# Patient Record
Sex: Male | Born: 1965 | Race: White | Hispanic: No | Marital: Married | Smoking: Former smoker
Health system: Southern US, Community
[De-identification: ages and names within clinical notes are randomized; demographics above are authoritative.]

## PROBLEM LIST (undated history)

## (undated) DIAGNOSIS — G8929 Other chronic pain: Secondary | ICD-10-CM

## (undated) DIAGNOSIS — E669 Obesity, unspecified: Secondary | ICD-10-CM

## (undated) DIAGNOSIS — Z87820 Personal history of traumatic brain injury: Secondary | ICD-10-CM

## (undated) DIAGNOSIS — L989 Disorder of the skin and subcutaneous tissue, unspecified: Secondary | ICD-10-CM

## (undated) DIAGNOSIS — Z8709 Personal history of other diseases of the respiratory system: Secondary | ICD-10-CM

## (undated) DIAGNOSIS — Z8781 Personal history of (healed) traumatic fracture: Secondary | ICD-10-CM

## (undated) DIAGNOSIS — M545 Low back pain, unspecified: Secondary | ICD-10-CM

## (undated) DIAGNOSIS — M1990 Unspecified osteoarthritis, unspecified site: Secondary | ICD-10-CM

## (undated) DIAGNOSIS — M5020 Other cervical disc displacement, unspecified cervical region: Secondary | ICD-10-CM

## (undated) DIAGNOSIS — R600 Localized edema: Secondary | ICD-10-CM

## (undated) DIAGNOSIS — Z9989 Dependence on other enabling machines and devices: Secondary | ICD-10-CM

## (undated) DIAGNOSIS — L0292 Furuncle, unspecified: Secondary | ICD-10-CM

## (undated) DIAGNOSIS — G4733 Obstructive sleep apnea (adult) (pediatric): Secondary | ICD-10-CM

## (undated) DIAGNOSIS — Z87898 Personal history of other specified conditions: Secondary | ICD-10-CM

## (undated) DIAGNOSIS — Z9109 Other allergy status, other than to drugs and biological substances: Secondary | ICD-10-CM

## (undated) DIAGNOSIS — E119 Type 2 diabetes mellitus without complications: Secondary | ICD-10-CM

## (undated) DIAGNOSIS — I10 Essential (primary) hypertension: Secondary | ICD-10-CM

## (undated) DIAGNOSIS — L0293 Carbuncle, unspecified: Secondary | ICD-10-CM

## (undated) DIAGNOSIS — M5030 Other cervical disc degeneration, unspecified cervical region: Secondary | ICD-10-CM

## (undated) DIAGNOSIS — G629 Polyneuropathy, unspecified: Secondary | ICD-10-CM

## (undated) DIAGNOSIS — Z973 Presence of spectacles and contact lenses: Secondary | ICD-10-CM

## (undated) HISTORY — DX: Obesity, unspecified: E66.9

## (undated) HISTORY — PX: APPENDECTOMY: SHX54

## (undated) HISTORY — DX: Unspecified osteoarthritis, unspecified site: M19.90

## (undated) HISTORY — DX: Other allergy status, other than to drugs and biological substances: Z91.09

---

## 1997-06-14 HISTORY — PX: OTHER SURGICAL HISTORY: SHX169

## 1998-09-18 ENCOUNTER — Emergency Department (HOSPITAL_COMMUNITY): Admission: EM | Admit: 1998-09-18 | Discharge: 1998-09-19 | Payer: Self-pay | Admitting: Emergency Medicine

## 1998-09-19 ENCOUNTER — Ambulatory Visit (HOSPITAL_BASED_OUTPATIENT_CLINIC_OR_DEPARTMENT_OTHER): Admission: RE | Admit: 1998-09-19 | Discharge: 1998-09-19 | Payer: Self-pay | Admitting: Orthopedic Surgery

## 2001-08-21 ENCOUNTER — Encounter: Payer: Self-pay | Admitting: Emergency Medicine

## 2001-08-21 ENCOUNTER — Emergency Department (HOSPITAL_COMMUNITY): Admission: EM | Admit: 2001-08-21 | Discharge: 2001-08-21 | Payer: Self-pay | Admitting: *Deleted

## 2001-08-23 HISTORY — PX: OTHER SURGICAL HISTORY: SHX169

## 2001-08-24 ENCOUNTER — Inpatient Hospital Stay (HOSPITAL_COMMUNITY): Admission: AD | Admit: 2001-08-24 | Discharge: 2001-08-25 | Payer: Self-pay | Admitting: Orthopedic Surgery

## 2001-11-11 ENCOUNTER — Emergency Department (HOSPITAL_COMMUNITY): Admission: EM | Admit: 2001-11-11 | Discharge: 2001-11-11 | Payer: Self-pay | Admitting: Emergency Medicine

## 2002-01-10 ENCOUNTER — Emergency Department (HOSPITAL_COMMUNITY): Admission: EM | Admit: 2002-01-10 | Discharge: 2002-01-10 | Payer: Self-pay | Admitting: Emergency Medicine

## 2002-01-10 ENCOUNTER — Encounter: Payer: Self-pay | Admitting: Emergency Medicine

## 2002-01-17 ENCOUNTER — Ambulatory Visit (HOSPITAL_COMMUNITY): Admission: RE | Admit: 2002-01-17 | Discharge: 2002-01-17 | Payer: Self-pay | Admitting: Specialist

## 2002-01-17 ENCOUNTER — Encounter: Payer: Self-pay | Admitting: Specialist

## 2002-02-08 ENCOUNTER — Observation Stay (HOSPITAL_COMMUNITY): Admission: RE | Admit: 2002-02-08 | Discharge: 2002-02-09 | Payer: Self-pay | Admitting: Specialist

## 2002-02-08 ENCOUNTER — Encounter: Payer: Self-pay | Admitting: Specialist

## 2002-02-08 ENCOUNTER — Encounter (INDEPENDENT_AMBULATORY_CARE_PROVIDER_SITE_OTHER): Payer: Self-pay | Admitting: Specialist

## 2002-02-08 HISTORY — PX: LUMBAR DISC SURGERY: SHX700

## 2002-05-07 ENCOUNTER — Encounter: Admission: RE | Admit: 2002-05-07 | Discharge: 2002-08-05 | Payer: Self-pay

## 2002-08-15 ENCOUNTER — Encounter: Admission: RE | Admit: 2002-08-15 | Discharge: 2002-11-13 | Payer: Self-pay

## 2002-11-20 ENCOUNTER — Encounter
Admission: RE | Admit: 2002-11-20 | Discharge: 2003-02-18 | Payer: Self-pay | Admitting: Physical Medicine & Rehabilitation

## 2003-01-08 ENCOUNTER — Emergency Department (HOSPITAL_COMMUNITY): Admission: EM | Admit: 2003-01-08 | Discharge: 2003-01-08 | Payer: Self-pay | Admitting: Emergency Medicine

## 2003-01-08 ENCOUNTER — Encounter: Payer: Self-pay | Admitting: Emergency Medicine

## 2003-03-14 ENCOUNTER — Encounter
Admission: RE | Admit: 2003-03-14 | Discharge: 2003-06-12 | Payer: Self-pay | Admitting: Physical Medicine & Rehabilitation

## 2003-07-05 ENCOUNTER — Encounter
Admission: RE | Admit: 2003-07-05 | Discharge: 2003-10-03 | Payer: Self-pay | Admitting: Physical Medicine & Rehabilitation

## 2003-08-17 ENCOUNTER — Emergency Department (HOSPITAL_COMMUNITY): Admission: EM | Admit: 2003-08-17 | Discharge: 2003-08-17 | Payer: Self-pay | Admitting: Emergency Medicine

## 2003-10-16 ENCOUNTER — Encounter
Admission: RE | Admit: 2003-10-16 | Discharge: 2004-01-14 | Payer: Self-pay | Admitting: Physical Medicine & Rehabilitation

## 2003-12-05 ENCOUNTER — Encounter: Admission: RE | Admit: 2003-12-05 | Discharge: 2003-12-05 | Payer: Self-pay | Admitting: Family Medicine

## 2003-12-26 ENCOUNTER — Encounter: Admission: RE | Admit: 2003-12-26 | Discharge: 2003-12-26 | Payer: Self-pay | Admitting: Family Medicine

## 2004-01-14 ENCOUNTER — Encounter
Admission: RE | Admit: 2004-01-14 | Discharge: 2004-02-13 | Payer: Self-pay | Admitting: Physical Medicine & Rehabilitation

## 2004-02-12 ENCOUNTER — Encounter: Admission: RE | Admit: 2004-02-12 | Discharge: 2004-02-12 | Payer: Self-pay | Admitting: Family Medicine

## 2004-02-28 ENCOUNTER — Encounter
Admission: RE | Admit: 2004-02-28 | Discharge: 2004-05-28 | Payer: Self-pay | Admitting: Physical Medicine & Rehabilitation

## 2004-02-28 ENCOUNTER — Ambulatory Visit: Payer: Self-pay | Admitting: Physical Medicine & Rehabilitation

## 2004-03-24 ENCOUNTER — Emergency Department (HOSPITAL_COMMUNITY): Admission: EM | Admit: 2004-03-24 | Discharge: 2004-03-24 | Payer: Self-pay | Admitting: Emergency Medicine

## 2004-05-21 ENCOUNTER — Ambulatory Visit: Payer: Self-pay | Admitting: Family Medicine

## 2004-07-03 ENCOUNTER — Emergency Department (HOSPITAL_COMMUNITY): Admission: EM | Admit: 2004-07-03 | Discharge: 2004-07-03 | Payer: Self-pay | Admitting: Emergency Medicine

## 2004-07-06 ENCOUNTER — Ambulatory Visit: Payer: Self-pay | Admitting: Sports Medicine

## 2004-07-13 ENCOUNTER — Emergency Department (HOSPITAL_COMMUNITY): Admission: EM | Admit: 2004-07-13 | Discharge: 2004-07-13 | Payer: Self-pay | Admitting: Emergency Medicine

## 2004-08-20 ENCOUNTER — Ambulatory Visit: Payer: Self-pay | Admitting: Sports Medicine

## 2004-08-24 ENCOUNTER — Emergency Department (HOSPITAL_COMMUNITY): Admission: EM | Admit: 2004-08-24 | Discharge: 2004-08-24 | Payer: Self-pay | Admitting: Emergency Medicine

## 2004-09-03 ENCOUNTER — Ambulatory Visit (HOSPITAL_COMMUNITY): Admission: RE | Admit: 2004-09-03 | Discharge: 2004-09-03 | Payer: Self-pay | Admitting: Neurological Surgery

## 2004-09-18 ENCOUNTER — Ambulatory Visit: Payer: Self-pay | Admitting: Sports Medicine

## 2004-11-06 ENCOUNTER — Ambulatory Visit: Payer: Self-pay | Admitting: Family Medicine

## 2006-07-06 ENCOUNTER — Ambulatory Visit: Payer: Self-pay | Admitting: Family Medicine

## 2006-08-11 DIAGNOSIS — M5417 Radiculopathy, lumbosacral region: Secondary | ICD-10-CM | POA: Insufficient documentation

## 2006-08-11 DIAGNOSIS — R350 Frequency of micturition: Secondary | ICD-10-CM

## 2006-08-11 DIAGNOSIS — E669 Obesity, unspecified: Secondary | ICD-10-CM

## 2006-08-11 DIAGNOSIS — I10 Essential (primary) hypertension: Secondary | ICD-10-CM

## 2006-08-11 DIAGNOSIS — F17200 Nicotine dependence, unspecified, uncomplicated: Secondary | ICD-10-CM

## 2006-08-11 HISTORY — DX: Obesity, unspecified: E66.9

## 2007-01-30 ENCOUNTER — Ambulatory Visit: Payer: Self-pay | Admitting: Family Medicine

## 2007-01-30 ENCOUNTER — Encounter: Payer: Self-pay | Admitting: Family Medicine

## 2007-01-30 DIAGNOSIS — H60509 Unspecified acute noninfective otitis externa, unspecified ear: Secondary | ICD-10-CM

## 2007-01-30 DIAGNOSIS — R000 Tachycardia, unspecified: Secondary | ICD-10-CM | POA: Insufficient documentation

## 2007-01-31 ENCOUNTER — Encounter: Payer: Self-pay | Admitting: Family Medicine

## 2007-02-01 ENCOUNTER — Encounter: Payer: Self-pay | Admitting: Family Medicine

## 2007-02-22 ENCOUNTER — Telehealth: Payer: Self-pay | Admitting: *Deleted

## 2007-03-10 ENCOUNTER — Ambulatory Visit: Payer: Self-pay | Admitting: Family Medicine

## 2007-03-10 ENCOUNTER — Encounter (INDEPENDENT_AMBULATORY_CARE_PROVIDER_SITE_OTHER): Payer: Self-pay | Admitting: Family Medicine

## 2007-03-10 DIAGNOSIS — R609 Edema, unspecified: Secondary | ICD-10-CM | POA: Insufficient documentation

## 2007-03-10 LAB — CONVERTED CEMR LAB
Albumin: 4.8 g/dL (ref 3.5–5.2)
CO2: 24 meq/L (ref 19–32)
Glucose, Bld: 112 mg/dL — ABNORMAL HIGH (ref 70–99)
Hemoglobin: 16.2 g/dL (ref 13.0–17.0)
MCV: 91.8 fL (ref 78.0–100.0)
Potassium: 4.6 meq/L (ref 3.5–5.3)
Pro B Natriuretic peptide (BNP): 7 pg/mL (ref 0.0–100.0)
RBC: 5.25 M/uL (ref 4.22–5.81)
Sodium: 141 meq/L (ref 135–145)
Total Bilirubin: 0.7 mg/dL (ref 0.3–1.2)
Total Protein: 7.5 g/dL (ref 6.0–8.3)
WBC: 8.4 10*3/uL (ref 4.0–10.5)

## 2007-03-12 ENCOUNTER — Encounter (INDEPENDENT_AMBULATORY_CARE_PROVIDER_SITE_OTHER): Payer: Self-pay | Admitting: Family Medicine

## 2007-03-17 ENCOUNTER — Ambulatory Visit: Payer: Self-pay | Admitting: Vascular Surgery

## 2007-03-17 ENCOUNTER — Ambulatory Visit: Payer: Self-pay | Admitting: Family Medicine

## 2007-03-17 ENCOUNTER — Telehealth (INDEPENDENT_AMBULATORY_CARE_PROVIDER_SITE_OTHER): Payer: Self-pay | Admitting: Family Medicine

## 2007-03-17 ENCOUNTER — Ambulatory Visit (HOSPITAL_COMMUNITY): Admission: RE | Admit: 2007-03-17 | Discharge: 2007-03-17 | Payer: Self-pay | Admitting: Sports Medicine

## 2007-03-21 ENCOUNTER — Ambulatory Visit: Payer: Self-pay | Admitting: Family Medicine

## 2007-03-21 ENCOUNTER — Encounter (INDEPENDENT_AMBULATORY_CARE_PROVIDER_SITE_OTHER): Payer: Self-pay | Admitting: Family Medicine

## 2007-03-22 ENCOUNTER — Encounter (INDEPENDENT_AMBULATORY_CARE_PROVIDER_SITE_OTHER): Payer: Self-pay | Admitting: Family Medicine

## 2007-03-22 LAB — CONVERTED CEMR LAB
Calcium: 9.3 mg/dL (ref 8.4–10.5)
Cholesterol: 227 mg/dL — ABNORMAL HIGH (ref 0–200)
HDL: 33 mg/dL — ABNORMAL LOW (ref >39)
Sodium: 140 meq/L (ref 135–145)
Total CHOL/HDL Ratio: 6.9

## 2007-03-27 ENCOUNTER — Encounter (INDEPENDENT_AMBULATORY_CARE_PROVIDER_SITE_OTHER): Payer: Self-pay | Admitting: Family Medicine

## 2007-04-21 ENCOUNTER — Telehealth: Payer: Self-pay | Admitting: *Deleted

## 2007-04-21 ENCOUNTER — Ambulatory Visit: Payer: Self-pay | Admitting: Family Medicine

## 2007-04-27 ENCOUNTER — Encounter: Payer: Self-pay | Admitting: *Deleted

## 2007-04-27 ENCOUNTER — Encounter (INDEPENDENT_AMBULATORY_CARE_PROVIDER_SITE_OTHER): Payer: Self-pay | Admitting: Family Medicine

## 2007-04-27 ENCOUNTER — Ambulatory Visit: Payer: Self-pay | Admitting: Family Medicine

## 2007-04-27 DIAGNOSIS — E785 Hyperlipidemia, unspecified: Secondary | ICD-10-CM

## 2007-05-02 ENCOUNTER — Ambulatory Visit (HOSPITAL_COMMUNITY): Admission: RE | Admit: 2007-05-02 | Discharge: 2007-05-02 | Payer: Self-pay | Admitting: Family Medicine

## 2007-05-02 ENCOUNTER — Encounter: Payer: Self-pay | Admitting: Family Medicine

## 2007-05-03 HISTORY — PX: TRANSTHORACIC ECHOCARDIOGRAM: SHX275

## 2007-05-04 ENCOUNTER — Encounter (INDEPENDENT_AMBULATORY_CARE_PROVIDER_SITE_OTHER): Payer: Self-pay | Admitting: Family Medicine

## 2007-07-20 ENCOUNTER — Telehealth (INDEPENDENT_AMBULATORY_CARE_PROVIDER_SITE_OTHER): Payer: Self-pay | Admitting: Family Medicine

## 2007-07-20 ENCOUNTER — Ambulatory Visit: Payer: Self-pay | Admitting: Family Medicine

## 2007-08-17 ENCOUNTER — Encounter (INDEPENDENT_AMBULATORY_CARE_PROVIDER_SITE_OTHER): Payer: Self-pay | Admitting: Family Medicine

## 2007-09-05 ENCOUNTER — Emergency Department (HOSPITAL_COMMUNITY): Admission: EM | Admit: 2007-09-05 | Discharge: 2007-09-06 | Payer: Self-pay | Admitting: Emergency Medicine

## 2007-09-11 ENCOUNTER — Ambulatory Visit: Payer: Self-pay | Admitting: Family Medicine

## 2007-09-11 ENCOUNTER — Encounter (INDEPENDENT_AMBULATORY_CARE_PROVIDER_SITE_OTHER): Payer: Self-pay | Admitting: Family Medicine

## 2007-09-11 LAB — CONVERTED CEMR LAB
ALT: 73 units/L — ABNORMAL HIGH (ref 0–53)
AST: 25 units/L (ref 0–37)
Albumin: 4.7 g/dL (ref 3.5–5.2)
Triglycerides: 1015 mg/dL — ABNORMAL HIGH (ref <150)

## 2007-10-07 ENCOUNTER — Emergency Department (HOSPITAL_COMMUNITY): Admission: EM | Admit: 2007-10-07 | Discharge: 2007-10-07 | Payer: Self-pay | Admitting: Emergency Medicine

## 2007-12-08 ENCOUNTER — Ambulatory Visit: Payer: Self-pay | Admitting: Family Medicine

## 2008-04-01 ENCOUNTER — Emergency Department (HOSPITAL_COMMUNITY): Admission: EM | Admit: 2008-04-01 | Discharge: 2008-04-01 | Payer: Self-pay | Admitting: Emergency Medicine

## 2008-04-01 ENCOUNTER — Ambulatory Visit: Payer: Self-pay | Admitting: Family Medicine

## 2008-04-01 ENCOUNTER — Encounter (INDEPENDENT_AMBULATORY_CARE_PROVIDER_SITE_OTHER): Payer: Self-pay | Admitting: Family Medicine

## 2008-04-01 ENCOUNTER — Telehealth (INDEPENDENT_AMBULATORY_CARE_PROVIDER_SITE_OTHER): Payer: Self-pay | Admitting: Family Medicine

## 2008-04-01 DIAGNOSIS — T782XXA Anaphylactic shock, unspecified, initial encounter: Secondary | ICD-10-CM

## 2008-04-01 DIAGNOSIS — L299 Pruritus, unspecified: Secondary | ICD-10-CM | POA: Insufficient documentation

## 2008-04-02 ENCOUNTER — Telehealth: Payer: Self-pay | Admitting: *Deleted

## 2008-04-02 ENCOUNTER — Encounter (INDEPENDENT_AMBULATORY_CARE_PROVIDER_SITE_OTHER): Payer: Self-pay | Admitting: Family Medicine

## 2008-04-02 DIAGNOSIS — R740 Nonspecific elevation of levels of transaminase and lactic acid dehydrogenase [LDH]: Secondary | ICD-10-CM

## 2008-04-02 LAB — CONVERTED CEMR LAB
AST: 26 units/L (ref 0–37)
Albumin: 4.6 g/dL (ref 3.5–5.2)
Alkaline Phosphatase: 55 units/L (ref 39–117)
BUN: 12 mg/dL (ref 6–23)
Hemoglobin: 15.8 g/dL (ref 13.0–17.0)
MCHC: 31.9 g/dL (ref 30.0–36.0)
Potassium: 5.1 meq/L (ref 3.5–5.3)
RDW: 13 % (ref 11.5–15.5)
Sodium: 140 meq/L (ref 135–145)

## 2008-04-03 LAB — CONVERTED CEMR LAB: Hep A IgM: NEGATIVE

## 2008-04-04 ENCOUNTER — Emergency Department (HOSPITAL_COMMUNITY): Admission: EM | Admit: 2008-04-04 | Discharge: 2008-04-04 | Payer: Self-pay | Admitting: Family Medicine

## 2008-04-04 ENCOUNTER — Encounter: Payer: Self-pay | Admitting: Family Medicine

## 2008-04-04 DIAGNOSIS — M2550 Pain in unspecified joint: Secondary | ICD-10-CM

## 2008-04-09 ENCOUNTER — Encounter: Payer: Self-pay | Admitting: Family Medicine

## 2008-04-09 ENCOUNTER — Ambulatory Visit: Payer: Self-pay | Admitting: Family Medicine

## 2008-04-09 LAB — CONVERTED CEMR LAB
ALT: 50 units/L (ref 0–53)
Albumin: 3.5 g/dL (ref 3.5–5.2)
Barbiturate Quant, Ur: NEGATIVE
Basophils Relative: 0 % (ref 0–1)
Benzodiazepines.: NEGATIVE
CO2: 29 meq/L (ref 19–32)
Calcium: 9.2 mg/dL (ref 8.4–10.5)
Chloride: 94 meq/L — ABNORMAL LOW (ref 96–112)
Creatinine,U: 303 mg/dL
Glucose, Bld: 158 mg/dL — ABNORMAL HIGH (ref 70–99)
Hemoglobin: 13.5 g/dL (ref 13.0–17.0)
Lead-Whole Blood: 0.7 ug/dL (ref <10.0)
Lymphocytes Relative: 17 % (ref 12–46)
Lymphs Abs: 2.1 10*3/uL (ref 0.7–4.0)
MCHC: 32.4 g/dL (ref 30.0–36.0)
Methadone: NEGATIVE
Monocytes Absolute: 0.7 10*3/uL (ref 0.1–1.0)
Monocytes Relative: 5 % (ref 3–12)
Neutro Abs: 9.3 10*3/uL — ABNORMAL HIGH (ref 1.7–7.7)
Potassium: 4 meq/L (ref 3.5–5.3)
Propoxyphene: NEGATIVE
RBC: 4.46 M/uL (ref 4.22–5.81)
Sodium: 138 meq/L (ref 135–145)
TSH: 2.081 microintl units/mL (ref 0.350–4.50)
Total Protein: 6.2 g/dL (ref 6.0–8.3)
WBC: 12.4 10*3/uL — ABNORMAL HIGH (ref 4.0–10.5)

## 2008-04-11 ENCOUNTER — Telehealth (INDEPENDENT_AMBULATORY_CARE_PROVIDER_SITE_OTHER): Payer: Self-pay | Admitting: Family Medicine

## 2008-04-23 ENCOUNTER — Ambulatory Visit: Payer: Self-pay | Admitting: Family Medicine

## 2008-10-31 ENCOUNTER — Emergency Department (HOSPITAL_COMMUNITY): Admission: EM | Admit: 2008-10-31 | Discharge: 2008-10-31 | Payer: Self-pay | Admitting: Emergency Medicine

## 2008-10-31 ENCOUNTER — Encounter: Payer: Self-pay | Admitting: Family Medicine

## 2009-03-20 ENCOUNTER — Encounter: Payer: Self-pay | Admitting: Family Medicine

## 2009-06-17 ENCOUNTER — Encounter: Payer: Self-pay | Admitting: Family Medicine

## 2009-06-17 ENCOUNTER — Ambulatory Visit: Payer: Self-pay | Admitting: Family Medicine

## 2009-06-17 DIAGNOSIS — R209 Unspecified disturbances of skin sensation: Secondary | ICD-10-CM | POA: Insufficient documentation

## 2009-06-17 LAB — CONVERTED CEMR LAB
BUN: 10 mg/dL (ref 6–23)
Calcium: 9.3 mg/dL (ref 8.4–10.5)
Creatinine, Ser: 0.83 mg/dL (ref 0.40–1.50)
Folate: >20 ng/mL
HDL: 41 mg/dL (ref >39)
TSH: 0.705 microintl units/mL (ref 0.350–4.500)
Triglycerides: 631 mg/dL — ABNORMAL HIGH (ref <150)

## 2009-06-25 ENCOUNTER — Telehealth (INDEPENDENT_AMBULATORY_CARE_PROVIDER_SITE_OTHER): Payer: Self-pay | Admitting: *Deleted

## 2009-06-25 ENCOUNTER — Ambulatory Visit: Payer: Self-pay | Admitting: Family Medicine

## 2009-06-30 ENCOUNTER — Encounter: Payer: Self-pay | Admitting: Family Medicine

## 2009-07-17 ENCOUNTER — Ambulatory Visit: Payer: Self-pay | Admitting: Family Medicine

## 2009-07-25 ENCOUNTER — Telehealth: Payer: Self-pay | Admitting: Family Medicine

## 2009-07-29 ENCOUNTER — Telehealth: Payer: Self-pay | Admitting: *Deleted

## 2009-07-30 ENCOUNTER — Encounter: Payer: Self-pay | Admitting: *Deleted

## 2009-07-30 ENCOUNTER — Ambulatory Visit: Payer: Self-pay | Admitting: Family Medicine

## 2009-08-13 ENCOUNTER — Encounter: Payer: Self-pay | Admitting: Family Medicine

## 2009-08-13 ENCOUNTER — Ambulatory Visit: Payer: Self-pay | Admitting: Family Medicine

## 2009-08-13 DIAGNOSIS — L0293 Carbuncle, unspecified: Secondary | ICD-10-CM

## 2009-08-21 ENCOUNTER — Encounter: Payer: Self-pay | Admitting: Family Medicine

## 2009-09-01 ENCOUNTER — Ambulatory Visit: Payer: Self-pay | Admitting: Family Medicine

## 2009-09-01 ENCOUNTER — Encounter: Payer: Self-pay | Admitting: Family Medicine

## 2009-12-08 ENCOUNTER — Encounter: Payer: Self-pay | Admitting: Family Medicine

## 2010-02-04 ENCOUNTER — Ambulatory Visit: Payer: Self-pay | Admitting: Vascular Surgery

## 2010-02-04 ENCOUNTER — Ambulatory Visit
Admission: RE | Admit: 2010-02-04 | Discharge: 2010-02-04 | Payer: Self-pay | Admitting: Physical Medicine and Rehabilitation

## 2010-02-04 ENCOUNTER — Encounter (INDEPENDENT_AMBULATORY_CARE_PROVIDER_SITE_OTHER): Payer: Self-pay | Admitting: Physical Medicine and Rehabilitation

## 2010-04-09 ENCOUNTER — Encounter: Payer: Self-pay | Admitting: Family Medicine

## 2010-06-04 ENCOUNTER — Encounter: Payer: Self-pay | Admitting: Family Medicine

## 2010-07-09 ENCOUNTER — Ambulatory Visit: Admit: 2010-07-09 | Payer: Self-pay

## 2010-07-14 NOTE — Miscellaneous (Signed)
Summary: Consent for Procedure  Consent for Procedure   Imported By: Clydell Hakim 08/14/2009 14:55:44  _____________________________________________________________________  External Attachment:    Type:   Image     Comment:   External Document

## 2010-07-14 NOTE — Letter (Signed)
Summary: Lipid Letter  [facility]  [address]   [place], [place] [address]   Phone: [phone]  Fax: [phone]    06/30/2009  [name] [address] [place], [place]  [address]  Dear [name]:  We have carefully reviewed your last lipid profile from 06/17/2009 and the results are noted below with a summary of recommendations for lipid management.    Cholesterol:       248     Goal: < 200   HDL "good" Cholesterol:   41     Goal: > 40   LDL "bad" Cholesterol:   not calculated   Goal: < 130   Triglycerides:       631     Goal: < 150  When the triglyceride level is too high, LDL is not calculated.    Your kidney function was normal.  Your thyroid function was normal.  Your sugar (glucose) was 106 (normal in fasting state is less than 99.  Your value falls in the range of prediabetes.  This is something we will need to keep an eye on.  Your vitamin B12 and folate levels were well within normal limits.  Your triglyceride levels were too high.  we may need to talk about starting a medicine to help control this.    TLC Diet (Therapeutic Lifestyle Change): Saturated Fats & Transfatty acids should be kept < 7% of total calories ***Reduce Saturated Fats Polyunstaurated Fat can be up to 10% of total calories Monounsaturated Fat Fat can be up to 20% of total calories Total Fat should be no greater than 25-35% of total calories Carbohydrates should be 50-60% of total calories Protein should be approximately 15% of total calories Fiber should be at least 20-30 grams a day ***Increased fiber may help lower LDL Total Cholesterol should be < 200mg /day Consider adding plant stanol/sterols to diet (example: Benacol spread) ***A higher intake of unsaturated fat may reduce Triglycerides and Increase HDL    Adjunctive Measures (may lower LIPIDS and reduce risk of Heart Attack) include: Aerobic Exercise (20-30 minutes 3-4 times a week) Limit Alcohol  Consumption Weight Reduction Aspirin 75-81 mg a day by mouth (if not allergic or contraindicated) Dietary Fiber 20-30 grams a day by mouth     Current Medications: 1)    Hydrocodone-acetaminophen 7.5-325 Mg Tabs (Hydrocodone-acetaminophen) .... Take 1 tablet by mouth three times a day 2)    Epipen 2-pak 0.3 Mg/0.75ml (1:1000) Devi (Epinephrine hcl (anaphylaxis)) .... Use as directed for symptoms of anaphylaxis 3)    Ventolin Hfa 108 (90 Base) Mcg/act Aers (Albuterol sulfate) .... Two puffs 4 times a day scheduled for the next 5 days.  then every 4 hours as needed for cough/wheeze 4)    Neurontin 100 Mg Caps (Gabapentin) .... One by mouth at bedtime x 1 week then one by mouth two times a day 5)    Benzonatate 100 Mg Caps (Benzonatate) .... One by mouth three times a day as needed cough 6)    Doxycycline Hyclate 100 Mg Caps (Doxycycline hyclate) .Marland Kitchen.. 1 cap by mouth two times a day for 10 days  If you have any questions, please call. We appreciate being able to work with you.   Sincerely,    [facility] Eustaquio Boyden  MD  Appended Document: Lipid Letter letter mailed.

## 2010-07-14 NOTE — Consult Note (Signed)
Summary: [facility]  [facility]   Imported By: De Nurse 12/23/2009 14:55:54  _____________________________________________________________________  External Attachment:    Type:   Image     Comment:   External Document

## 2010-07-14 NOTE — Progress Notes (Signed)
Summary: triage  Phone Note Call from Patient Call back at [phone]   Summary of Call: Pt was seen last week and still has cough and fever.  Wanting to be worked in today. Initial call taken by: Clydell Hakim,  June 25, 2009 11:54 AM  Follow-up for Phone Call        spoke with patient and  he states he has continued with cough and now has fever of 102.  appointment scheduled today .  Follow-up by: Theresia Lo RN,  June 25, 2009 11:58 AM

## 2010-07-14 NOTE — Consult Note (Signed)
Summary: Surgery - pilonidal cyst, rtc 1 mo and consider surgery  Allen,MD   Imported By: Bradly Bienenstock 09/15/2009 11:42:22  _____________________________________________________________________  External Attachment:    Type:   Image     Comment:   External Document

## 2010-07-14 NOTE — Consult Note (Signed)
Summary: [facility] - see below  [facility]   Imported By: Clydell Hakim 10/01/2009 15:00:53  _____________________________________________________________________  External Attachment:    Type:   Image     Comment:   External Document

## 2010-07-14 NOTE — Assessment & Plan Note (Signed)
Summary: boil on thigh,tcb   Vital Signs:  Patient profile:   45 year old male Height:      66 inches Weight:      277.9 pounds BMI:     45.02 Temp:     98.3 degrees F oral Pulse rate:   109 / minute BP sitting:   150 / 98  (left arm) Cuff size:   large  Vitals Entered By: Garen Grams LPN (September 01, 2009 2:12 PM) CC: boil on right thigh Is Patient Diabetic? No Pain Assessment Patient in pain? yes     Location: right thigh   Primary Care Provider:  Eustaquio Boyden  MD  CC:  boil on right thigh.  History of Present Illness: Boil R inner thigh.    Has had same place several times over the years.  Came up this time 2-3 days ago.  No fever or spreading redness or discharge  ROS - as above PMH - Medications reviewed and updated in medication list.  Smoking Status noted in VS form    Habits & Providers  Alcohol-Tobacco-Diet     Tobacco Status: current  Current Medications (verified): 1)  Multivitamins  Tabs (Multiple Vitamin) 2)  B Complex  Tabs (B Complex Vitamins) 3)  Epipen 2-Pak 0.3 Mg/0.90ml (1:1000) Devi (Epinephrine Hcl (Anaphylaxis)) .... Use As Directed For Symptoms of Anaphylaxis 4)  Ventolin Hfa 108 (90 Base) Mcg/act Aers (Albuterol Sulfate) .... Two Puffs 4 Times A Day Scheduled For The Next 5 Days.  Then Every 4 Hours As Needed For Cough/wheeze 5)  Hydrocodone-Acetaminophen 7.5-325 Mg Tabs (Hydrocodone-Acetaminophen) .... Take 1 Tablet By Mouth Three Times A Day 6)  Neurontin 300 Mg Caps (Gabapentin) .... Take One By Mouth Three Times A Day 7)  Amitriptyline Hcl 50 Mg Tabs (Amitriptyline Hcl) .... Take One By Mouth Qhs 8)  Fish Oil 1000 Mg Caps (Omega-3 Fatty Acids) .... One By Mouth Three Times A Day 9)  Mucinex Dm 30-600 Mg Xr12h-Tab (Dextromethorphan-Guaifenesin) .... Take One By Mouth Two Times A Day As Needed Cough 10)  Ibuprofen 800 Mg Tabs (Ibuprofen) .... One Tab By Mouth Three Times A Day As Needed Pain  Allergies: 1)  ! Penicillin 2)  ! * Bee  Sting 3)  * Hydroxyzine  Physical Exam  General:  Well-developed,well-nourished,in no acute distress; alert,appropriate and cooperative throughout examination Skin:  Inner uppper R thing 4 cm ovoid soft tissue swelling that is tender without skin opening mild surrounding erythema 1-2 mm Feels Flucutant  PROCEDURE Prepped sterilly.  Field block with 1% lidocaine aprox 5 cc.  Incised with 11 blade 1.5 cm.  Thin brown liquid express.  Loculations broken up with hemostat.  Packed with 1/2in gauze approximately 20 cm.  Tolerated well   Impression & Recommendations:  Problem # 1:  BOILS, RECURRENT (ICD-680.9) He has a Careers adviser who is following his pilonidal area infection.  Asked him to discuss the recurrent infections on his inner thigh probable recurrent cyst or lymph node that might need to be removed  Orders: [facility]- Est Level  3 ([address]) I&D Abcess, simple- [facility] ([address])  Complete Medication List: 1)  Multivitamins Tabs (Multiple vitamin) 2)  B Complex Tabs (B complex vitamins) 3)  Epipen 2-pak 0.3 Mg/0.73ml (1:1000) Devi (Epinephrine hcl (anaphylaxis)) .... Use as directed for symptoms of anaphylaxis 4)  Ventolin Hfa 108 (90 Base) Mcg/act Aers (Albuterol sulfate) .... Two puffs 4 times a day scheduled for the next 5 days.  then every 4 hours as needed for  cough/wheeze 5)  Hydrocodone-acetaminophen 7.5-325 Mg Tabs (Hydrocodone-acetaminophen) .... Take 1 tablet by mouth three times a day 6)  Neurontin 300 Mg Caps (Gabapentin) .... Take one by mouth three times a day 7)  Amitriptyline Hcl 50 Mg Tabs (Amitriptyline hcl) .... Take one by mouth qhs 8)  Fish Oil 1000 Mg Caps (Omega-3 fatty acids) .... One by mouth three times a day 9)  Mucinex Dm 30-600 Mg Xr12h-tab (Dextromethorphan-guaifenesin) .... Take one by mouth two times a day as needed cough 10)  Ibuprofen 800 Mg Tabs (Ibuprofen) .... One tab by mouth three times a day as needed pain  Patient Instructions: 1)  Leave dressing on for 24  hours 2)  Then soak off and remove 1/2" of packing every day. 3)  If you get a fever or the redness spread then call us immediately  4)  Try Phisohex over the counter for prevention of infection  Appended Document: boil on thigh,tcb RDS = reflex sympathetic dystrophy

## 2010-07-14 NOTE — Miscellaneous (Signed)
Summary: work in flu  Clinical Lists Changes he wants to be seen for the flu. I asked him if he was sure as he had indicated at last phone call he would nt be coming back. he said he needed to see someone. wants to come at same time as his wife. appt made for 11am work in. aware of wait & that he will not be seeing his pcp.Golden Circle RN  July 30, 2009 8:50 AM  thanks. Eustaquio Boyden  MD  July 30, 2009 10:13 AM

## 2010-07-14 NOTE — Progress Notes (Signed)
Summary: triage  Phone Note Call from Patient Call back at Home Phone [phone]   Caller: Patient Summary of Call: pt has the flu again and wants to know if he can get antibiotics - coughing up green stuff [facility] [address] Initial call taken by: De Nurse,  July 25, 2009 8:33 AM  Follow-up for Phone Call        c/o getting sick again. prodective cough & ribs hurt from coughing so much. does not want to come back in as it is a 45 minute ride & he feels too sick to get up & out of bed. told him I will send this request for meds to pcp & will call him back with response Follow-up by: Golden Circle RN,  July 25, 2009 8:40 AM  Additional Follow-up for Phone Call Additional follow up Details #1::        he wants doxy as he has no insurance & that is the cheapest Additional Follow-up by: Golden Circle RN,  July 25, 2009 8:46 AM    Additional Follow-up for Phone Call Additional follow up Details #2::    recently treated with abx  < 1 mo ago.  If he completed his course( ask him this), that should have sufficed.  No more abx unless seen.  What i can do is precribe cough medicine to help him sleep at night (although no narcotic as he is under pain contract).  O/w needs to return to be seen. Follow-up by: Eustaquio Boyden  MD,  July 25, 2009 12:40 PM  Additional Follow-up for Phone Call Additional follow up Details #3:: Details for Additional Follow-up Action Taken: left message Additional Follow-up by: Golden Circle RN,  July 28, 2009 10:14 AM  New/Updated Medications: MUCINEX DM 30-600 MG XR12H-TAB (DEXTROMETHORPHAN-GUAIFENESIN) take one by mouth two times a day as needed cough Prescriptions: MUCINEX DM 30-600 MG XR12H-TAB (DEXTROMETHORPHAN-GUAIFENESIN) take one by mouth two times a day as needed cough  #30 x 0   Entered and Authorized by:   Eustaquio Boyden  MD   Signed by:   Eustaquio Boyden  MD on 07/25/2009   Method used:   [facility]        [facility]   [address] [id number]       [address]       [place], [place]  [address]       Ph: [phone] or [phone]       Fax: [phone]   RxID:   [id number]  left message.Golden Circle RN  July 28, 2009 2:22 PM states he never got any of my messages. told him about the rx. asked him to make an appt. he has to find someone to bring him. he will call back to make an appt.Golden Circle RN  July 29, 2009 11:19 AM  He called back very irate that md had called in mucinex. states he wants antibiotics. told him again he will need to be seen & offered appt. He went on & on about how he has never had trouble getting antibiotics before. stated he wanted a different md. told him I will send this request to my supervisor. explained no md will call in antibiotics without seeing him. stated he was just here last week (legs) he remained very irate & said "I want nothing more to do with  [facility]. He then hung up.Golden Circle RN  July 29, 2009 11:41 AM  noted. Eustaquio Boyden  MD  July 29, 2009 12:00 PM
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
## 2010-07-14 NOTE — Assessment & Plan Note (Signed)
Summary: cough and fever temp 102 now /ls   Vital Signs:  Patient profile:   45 year old male Height:      66 inches Weight:      284. pounds BMI:     46.00 Temp:     98.7 degrees F oral Pulse rate:   112 / minute BP sitting:   134 / 93  (right arm) Cuff size:   large  Vitals Entered By: Gladstone Pih (June 25, 2009 3:23 PM) CC: C/O fever and cough Is Patient Diabetic? No Pain Assessment Patient in pain? no        Primary Care Provider:  Eustaquio Boyden  MD  CC:  C/O fever and cough.  History of Present Illness: 1. fever and cough--coughing, congested, fever up  to 102 yesterday.  seen 1/4 and prescribed benzonate and albuterol.  symptoms have been going on for about 2 weeks.  cough med helped some, but cough is still bothering him alot, especially at night.  he is worried because he is not getting better and because he has a fever.   whole family sick with similar symptoms.  also sinus pressure.    Habits & Providers  Alcohol-Tobacco-Diet     Tobacco Status: current     Tobacco Counseling: to quit use of tobacco products     Cigarette Packs/Day: 0.5  Current Medications (verified): 1)  Hydrocodone-Acetaminophen 7.5-325 Mg Tabs (Hydrocodone-Acetaminophen) .... Take 1 Tablet By Mouth Three Times A Day 2)  Epipen 2-Pak 0.3 Mg/0.36ml (1:1000) Devi (Epinephrine Hcl (Anaphylaxis)) .... Use As Directed For Symptoms of Anaphylaxis 3)  Ventolin Hfa 108 (90 Base) Mcg/act Aers (Albuterol Sulfate) .... Two Puffs 4 Times A Day Scheduled For The Next 5 Days.  Then Every 4 Hours As Needed For Cough/wheeze 4)  Neurontin 100 Mg Caps (Gabapentin) .... One By Mouth At Bedtime X 1 Week Then One By Mouth Two Times A Day 5)  Benzonatate 100 Mg Caps (Benzonatate) .... One By Mouth Three Times A Day As Needed Cough  Allergies: 1)  ! Penicillin 2)  ! * Bee Sting 3)  * Hydroxyzine  Past History:  Past Medical History: Reviewed history from 06/17/2009 and no changes required. Chronic  back pain - Dr. 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 arms covered in tattoes. Ears:  right tm reddish.  landmarks visible.  left tm normal.  multiple piercings on cartilage Nose:  erythematous mucous membranes.  Mouth:  MMM, o/p slightly erythematous.  no exudate Neck:  No deformities, masses, or tenderness noted. Lungs:  expiratory wheezes throughout lung fields Heart:  Normal rate and regular rhythm. S1 and S2 normal without gallop, murmur, click, rub or other extra sounds. Additional Exam:  vital signs reviewed    Impression & Recommendations:  Problem # 1:  COUGH (ICD-786.2) Assessment Deteriorated  significant wheezing on exam today.  concerned because he is still febrile.  will treat with doxy, prednisone, and scheduled albuterol.  this almost seems like a copd exac, but he does have that diagnosis.  given his smoking hx.  wonder if he does have some underlying obstructive disease.  he already has a  follow up set up with pcp.    Orders: [facility]- Est Level  3 (19147)  Complete Medication List: 1)  Hydrocodone-acetaminophen 7.5-325 Mg Tabs (Hydrocodone-acetaminophen) .... Take 1 tablet by mouth three times a day 2)  Epipen 2-pak 0.3 Mg/0.27ml (1:1000) Devi (Epinephrine hcl (anaphylaxis)) .... Use as directed for symptoms of anaphylaxis 3)  Ventolin Hfa 108 (90 Base) Mcg/act Aers (Albuterol sulfate) .... Two puffs 4 times a day scheduled for the next 5 days.  then every 4 hours as  needed for cough/wheeze 4)  Neurontin 100 Mg Caps (Gabapentin) .... One by mouth at bedtime x 1 week then one by mouth two times a day 5)  Benzonatate 100 Mg Caps (Benzonatate) .... One by mouth three times a day as needed cough 6)  Prednisone 50 Mg Tabs (Prednisone) .Marland Kitchen.. 1 tab by mouth daily for 5 days 7)  Doxycycline Hyclate 100 Mg Caps (Doxycycline hyclate) .Marland Kitchen.. 1 cap by mouth two times a day for 10 days  Patient Instructions: 1)  It was nice to see you today. 2)  For your cough, take the prednisone and doxycycline I prescribed you. 3)  Also, use your inhaler at least 4 times a day scheduled for the next 4 days.  then use it as needed.   4)  Please schedule a follow-up appointment in 3 weeks to make sure you are getting better.  5)  I do think that now is a good time to think about stopping smoking Prescriptions: DOXYCYCLINE HYCLATE 100 MG CAPS (DOXYCYCLINE HYCLATE) 1 cap by mouth two times a day for 10 days  #20 x 0   Entered and Authorized by:   Asher Muir MD   Signed by:   Asher Muir MD on 06/25/2009   Method used:   [facility]        [facility]  [id number]       [address]       [place], [place]  [address]       Ph: [phone] or [phone]       Fax: [phone]   RxID:   [id number] PREDNISONE 50 MG TABS (PREDNISONE) 1 tab by mouth daily for 5 days  #5 x 0   Entered and Authorized by:   Asher Muir MD   Signed by:   Asher Muir MD on 06/25/2009   Method used:   [facility]        [facility]  [address] [id number]       [address]       [place], [place]  [address]       Ph: [phone] or [phone]       Fax: [phone]   RxID:   [id number]

## 2010-07-14 NOTE — Assessment & Plan Note (Signed)
Summary: flu per pt/[place]ut[place]   Vital Signs:  Patient profile:   45 year old male Weight:      297.3 pounds O2 Sat:      92 % on Room air Temp:     98.3 degrees F oral Pulse rate:   84 / minute Pulse rhythm:   regular BP sitting:   155 / 108  (left arm) Cuff size:   large  Vitals Entered By: Loralee Pacas CMA (July 30, 2009 10:48 AM)  O2 Flow:  Room air  Primary Care Provider:  Eustaquio Boyden  MD  CC:  Cough .  History of Present Illness: 1) Productive cough: Cough w/ green phlegm, dyspnea, wheezing, runny nose, subjective fever x 3 days. Last had similar symptoms in January 2011. Mucinex helps somewhat. Cough worse at night. Right sided chest soreness with coughing. + sick contact = family with URI symptoms. Smokes 1/2 pack per day currently. Longstanding tobacco use. Denies substernal chest pain, pleuritic pain, emesis, diarrhea, sinus pressure, chills, bloody sputum, presyncope, appetie chagne. No prior diagnosis of COPD or prior PFTs. Not using inhaler currently.   Current Medications (verified): 1)  Multivitamins  Tabs (Multiple Vitamin) 2)  B Complex  Tabs (B Complex Vitamins) 3)  Epipen 2-Pak 0.3 Mg/0.20ml (1:1000) Devi (Epinephrine Hcl (Anaphylaxis)) .... Use As Directed For Symptoms of Anaphylaxis 4)  Ventolin Hfa 108 (90 Base) Mcg/act Aers (Albuterol Sulfate) .... Two Puffs 4 Times A Day Scheduled For The Next 5 Days.  Then Every 4 Hours As Needed For Cough/wheeze 5)  Hydrocodone-Acetaminophen 7.5-325 Mg Tabs (Hydrocodone-Acetaminophen) .... Take 1 Tablet By Mouth Three Times A Day 6)  Neurontin 100 Mg Caps (Gabapentin) .... Two Capsules Three Times A Day 7)  Amitriptyline Hcl 50 Mg Tabs (Amitriptyline Hcl) .... Take One By Mouth Qhs 8)  Fish Oil 1000 Mg Caps (Omega-3 Fatty Acids) .... One By Mouth Three Times A Day 9)  Mucinex Dm 30-600 Mg Xr12h-Tab (Dextromethorphan-Guaifenesin) .... Take One By Mouth Two Times A Day As Needed Cough 10)  Doxycycline Hyclate 100  Mg Caps (Doxycycline Hyclate) .... One Tab By Mouth Two Times A Day X 10 Days 11)  Prednisone 50 Mg Tabs (Prednisone) .... One Tab By Mouth Daily X 5 Days  Allergies (verified): 1)  ! Penicillin 2)  ! * Bee Sting 3)  * Hydroxyzine  Physical Exam  General:  obese, coughing, NAD  Head:  no sinus pain  Eyes:  no conjunctivitis  Nose:  rhinorrhea and congestion  Mouth:  moist membranes, mild erythema w/o exudate  Neck:  no lymphadenopathy, obese  Lungs:  expiratory wheezes throughout lung fields w/o crackles  Heart:  Normal rate and regular rhythm. S1 and S2 normal without gallop, murmur, click, rub or other extra sounds. Abdomen:  +BS, soft, NT obese,  Pulses:  2+ radials  Extremities:  no cyanosis  Neurologic:  alert & oriented X3.     Impression & Recommendations:  Problem # 1:  COUGH (ICD-786.2)  Will treat as COPD exacerbation outpatient therapy w/p prednisone, doxycyline, albuterol as below. Likely degree of underlying obstructive lung disease. Would benefit from PFTs in future for concrete diagnosis. Follow up PCP in 10 - 14 days. Ibuprofen for pain.    Orders: [facility]- Est Level  3 ([address]109)  Orders: [facility]- Est Level  3 (60454)  Complete Medication List: 1)  Multivitamins Tabs (Multiple vitamin) 2)  B Complex Tabs (B complex vitamins) 3)  Epipen 2-pak 0.3 Mg/0.42ml (1:1000) Devi (Epinephrine hcl (anaphylaxis)) .Marland KitchenMarland KitchenMarland Kitchen  Use as directed for symptoms of anaphylaxis 4)  Ventolin Hfa 108 (90 Base) Mcg/act Aers (Albuterol sulfate) .... Two puffs 4 times a day scheduled for the next 5 days.  then every 4 hours as needed for cough/wheeze 5)  Hydrocodone-acetaminophen 7.5-325 Mg Tabs (Hydrocodone-acetaminophen) .... Take 1 tablet by mouth three times a day 6)  Neurontin 100 Mg Caps (Gabapentin) .... Two capsules three times a day 7)  Amitriptyline Hcl 50 Mg Tabs (Amitriptyline hcl) .... Take one by mouth qhs 8)  Fish Oil 1000 Mg Caps (Omega-3 fatty acids) .... One by mouth three times a  day 9)  Mucinex Dm 30-600 Mg Xr12h-tab (Dextromethorphan-guaifenesin) .... Take one by mouth two times a day as needed cough 10)  Doxycycline Hyclate 100 Mg Caps (Doxycycline hyclate) .... One tab by mouth two times a day x 10 days 11)  Prednisone 50 Mg Tabs (Prednisone) .... One tab by mouth daily x 5 days 12)  Ibuprofen 800 Mg Tabs (Ibuprofen) .... One tab by mouth three times a day as needed pain  Patient Instructions: 1)  Take the prednisone and doxycycline as directed. Complete the entire course of doxycycline - even if you start feeling better.  2)  Use your inhaler at least 4-6 times a day scheduled for the next 4 days.  then use it as needed.   3)  Please schedule a follow-up appointment in 2-3 weeks to make sure you are getting better.  4)  Try to quit smoking. You can talk more with Dr. Sharen Hones about this as well.  Prescriptions: IBUPROFEN 800 MG TABS (IBUPROFEN) one tab by mouth three times a day as needed pain  #20 x 0   Entered and Authorized by:   Bobby Rumpf  MD   Signed by:   Bobby Rumpf  MD on 07/30/2009   Method used:   [facility]        [facility]  [address] [id number]       [address]       [place], [place]  [address]       Ph: [phone] or [phone]       Fax: [phone]   RxID:   [id number] VENTOLIN HFA 108 (90 BASE) MCG/ACT AERS (ALBUTEROL SULFATE) two puffs 4 times a day scheduled for the next 5 days.  then every 4 hours as needed for cough/wheeze  #1 x 0   Entered and Authorized by:   Bobby Rumpf  MD   Signed by:   Bobby Rumpf  MD on 07/30/2009   Method used:   [facility]        [facility]  [address] [id number]       [address]       [place], [place]  [address]       Ph: [phone] or [phone]       Fax: [phone]   RxID:   [id number] PREDNISONE 50 MG TABS (PREDNISONE) one tab by mouth daily x 5 days  #5 x 0   Entered and Authorized by:   Bobby Rumpf  MD   Signed by:    Bobby Rumpf  MD on 07/30/2009   Method used:   [facility]        [facility]  [address] [id number]       [address]       [facility]  [place], [place]  [address]       Ph: [phone] or [phone]       Fax: [phone]   RxID:   [id number] DOXYCYCLINE HYCLATE 100 MG CAPS (DOXYCYCLINE HYCLATE) one tab by mouth two times a day x 10 days  #20 x 0   Entered and Authorized by:   Bobby Rumpf  MD   Signed by:   Bobby Rumpf  MD on 07/30/2009   Method used:   [facility]        [facility]  [address] [id number]       [address]       [place], [place]  [address]       Ph: [phone] or [phone]       Fax: [phone]   RxID:   [id number]

## 2010-07-14 NOTE — Progress Notes (Signed)
Summary: phn msg  Phone Note Call from Patient Call back at Home Phone [phone]   Caller: Patient Summary of Call: pt is mad and needs to talk to someone about problem with noone seeming to care about his condition and needs.  He didn't find out that Musinex was called in on Friday until yesterday and is upset about that and the fact that the nurse was rude to him. he also is upset about a few other things.....Marland Kitchen please call.  Initial call taken by: De Nurse,  July 29, 2009 12:01 PM  Follow-up for Phone Call        Given that his complaint is about his medical care, have asked Dr. Deirdre Priest to call patient. Follow-up by: Dennison Nancy RN,  July 29, 2009 4:38 PM     Appended Document: phn msg Spoke with Mr Kalas in person today after his [place] visit.  Explained our policy about not prescribing antibiotics over the phone and that we had multiple attempts at calling him about the mucines prescription.    He was polite and said he understood the policy but had been frustrated at being sick and not being able to be seen over the weekend
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
## 2010-07-16 NOTE — Consult Note (Signed)
Summary: [facility]   Imported By: Bradly Bienenstock 06/12/2010 09:08:55  _____________________________________________________________________  External Attachment:    Type:   Image     Comment:   External Document

## 2010-07-17 NOTE — Consult Note (Signed)
Summary: [facility]   Imported By: De Nurse 04/23/2010 10:59:12  _____________________________________________________________________  External Attachment:    Type:   Image     Comment:   External Document

## 2010-07-17 NOTE — Miscellaneous (Signed)
Summary: Informed Consent for Medical Procedure  Informed Consent for Medical Procedure   Imported By: Knox Royalty 09/29/2009 11:44:32  _____________________________________________________________________  External Attachment:    Type:   Image     Comment:   External Document

## 2010-08-12 ENCOUNTER — Ambulatory Visit (INDEPENDENT_AMBULATORY_CARE_PROVIDER_SITE_OTHER): Payer: Medicare Other | Admitting: Sports Medicine

## 2010-08-12 ENCOUNTER — Encounter: Payer: Self-pay | Admitting: Sports Medicine

## 2010-08-12 VITALS — BP 149/104 | HR 94 | Temp 98.4°F | Ht 69.0 in | Wt 274.0 lb

## 2010-08-12 DIAGNOSIS — M25519 Pain in unspecified shoulder: Secondary | ICD-10-CM

## 2010-08-12 DIAGNOSIS — M25512 Pain in left shoulder: Secondary | ICD-10-CM | POA: Insufficient documentation

## 2010-08-12 NOTE — Progress Notes (Signed)
  Subjective:    Patient ID: [name], male    DOB: 02-Jan-1966, 45 y.o.   MRN: [id number]  HPI Woke up 1 month ago with L shoulder pain.  No injury.  Pain is over shoulder blade, goes down arm and to pinkie when he rests on elbow.  Pain is also worse with overhead activities and it severely limits his ADLs.  Relief with staying still.  No bowel/bladder problems.   Review of Systems    See HPI Objective:   Physical Exam  Constitutional: He appears well-developed and well-nourished. No distress.  Musculoskeletal:       L Shoulder: Inspection reveals no abnormalities, atrophy or asymmetry. Palpation with tenderness over supraspinatus fossa. ROM limited in abduction and flexion to approx 80 deg by pain. Rotator cuff strength weak throughout. POSITIVE impingement signs with positive Neer and Hawkin's tests, empty can sign, positive painful arc, positive lift-off/scratch test.   Negative drop-arm sign. Speeds and Yergason's tests normal. No labral pathology noted with negative Obrien's, negative clunk and good stability. Normal scapular function observed. No apprehension sign  Positive spurlings test.      Consent obtained and verified. Sterile betadine prep. Furthur cleansed with alcohol. Topical analgesic spray: Ethyl chloride. Joint: L subacromial Approached in typical fashion with: 25g needle Completed without difficulty Meds: 2cc marcaine, 2cc lidocaine 1%, 1cc kenalog 40 Immediate improvement in symptoms noted. Aftercare instructions and Red flags advised.         Assessment & Plan:

## 2010-08-12 NOTE — Progress Notes (Signed)
Pt stated that he just woke up one morning a month ago and his left shoulder was in pain. Pain starts in his neck and down to the bottom of his shoulder blade and radiates down his arm to his fingertips. He has numbness in hand.Loralee Pacas [place]

## 2010-08-12 NOTE — Patient Instructions (Signed)
Great to see you, Injected shoulder. Come back to see me in 1-2 weeks if no better and we can ultrasound the shoulder to ensure no tear. If you are better, no need to see me. Do the exercises.  -Dr. Karie Schwalbe.

## 2010-08-12 NOTE — Assessment & Plan Note (Signed)
Symptoms and exam highly suggestive of impingement syndrome with subacromial bursitis vs supraspinatus fraying. Injection relieved symptoms immediately. Rehab exercises and 1 meter of theraband given. He will RTC 1-2 weeks if no better and I can ultrasound rotator cuff looking for tears at that point. Some symptoms also suggestive of cervical radiculopathy and cubital tunnel syndrome.  He claims he has had an accident with cervical fx years ago that healed abnormally, he has been seen by ortho for this. Can address this at a later date.

## 2010-08-13 ENCOUNTER — Telehealth: Payer: Self-pay | Admitting: Family Medicine

## 2010-08-13 NOTE — Telephone Encounter (Signed)
Injection should last 2-3 months.  No prednisone needed. Rehab exercises will be the key now.

## 2010-08-13 NOTE — Telephone Encounter (Signed)
Pt calling to ask if physician could call in rx for Prednisone.  Received cortisone injection to shoulder yesterday.  Felt better afterwards, but now need rx.

## 2010-08-18 ENCOUNTER — Encounter: Payer: Self-pay | Admitting: Sports Medicine

## 2010-08-18 ENCOUNTER — Ambulatory Visit (INDEPENDENT_AMBULATORY_CARE_PROVIDER_SITE_OTHER): Payer: Medicare Other | Admitting: Sports Medicine

## 2010-08-18 VITALS — BP 158/95 | HR 99 | Temp 98.5°F

## 2010-08-18 DIAGNOSIS — M25512 Pain in left shoulder: Secondary | ICD-10-CM

## 2010-08-18 DIAGNOSIS — M25519 Pain in unspecified shoulder: Secondary | ICD-10-CM

## 2010-08-18 MED ORDER — GABAPENTIN 600 MG PO TABS: ORAL_TABLET | ORAL | Status: DC

## 2010-08-18 MED ORDER — PREDNISONE (PAK) 10 MG PO TABS: 10.0000 mg | ORAL_TABLET | Freq: Every day | ORAL | Status: AC

## 2010-08-18 MED ORDER — MELOXICAM 15 MG PO TABS: 15.0000 mg | ORAL_TABLET | Freq: Every day | ORAL | Status: AC

## 2010-08-18 NOTE — Progress Notes (Signed)
  Subjective:    Patient ID: [name], male    DOB: [birth year], 45 y.o.   MRN: [id number]  HPI Woke up 1 month ago with L shoulder pain.  No injury.  Pain is over shoulder blade, goes down arm and to pinkie when he rests on elbow.  Pain is also worse with overhead activities and it severely limits his ADLs.  Relief with staying still.  No bowel/bladder problems.  Injected 1 wk ago with improvement in symptoms.  Within a few days after injection symptoms returned.  Cont'd down L arm to pinkie.      Review of Systems    See HPI Objective:   Physical Exam  Constitutional: He appears well-nourished. No distress.      L Shoulder: Inspection reveals no abnormalities, atrophy or asymmetry. Palpation with tenderness over supraspinatus fossa. ROM limited in abduction and flexion to approx 80 deg by pain. Rotator cuff strength weak throughout. POSITIVE impingement signs with positive Neer and Hawkin's tests, empty can sign, positive painful arc, positive lift-off/scratch test.   Negative drop-arm sign. Speeds and Yergason's tests normal. No labral pathology noted with negative Obrien's, negative clunk and good stability. Normal scapular function observed. No apprehension sign  Positive spurlings test with reproduction of symptoms in a C8/T1 radicular fashion.  MSK US performed of: L Shoulder  L Shoulder:   Supraspinatus:  Appears heterogenously hypoechoic on long and transverse views but without overt tear, bulge of bursablades seen with shoulder abduction on impingement view. Infraspinatus:  Appears normal on long and transverse views. Subscapularis:  Appears normal on long and transverse views. Teres Minor:  Appears normal on long and transverse views. AC joint:  Capsule undistended, no geyser sign. Glenohumeral Joint:  Appears normal without effusion. Biceps Tendon:  Appears normal on long and transverse views, no fraying of tendon, tendon located in intertubercular groove, no  subluxation with shoulder internal or external rotation.  See link below to view images.       Assessment & Plan:

## 2010-08-18 NOTE — Assessment & Plan Note (Signed)
Improved briefly. [place] suggestive of cont'd subacromial bursitis, no tears seen. Also with sx suggestive of cervical radiculopathy. Pred burst Chng motrin to mobic. Formal PT. Incr neurontin to max.

## 2010-08-18 NOTE — Patient Instructions (Signed)
Physical therapy. Mobic for pain. Prednisone taper pack. Increase gabapentin as directed, pick up the new pills.  Come back to see me if no better after finishing the medications and a few sessions of PT.  -Dr. Karie Schwalbe.

## 2010-08-25 ENCOUNTER — Ambulatory Visit: Payer: Medicare Other | Admitting: Family Medicine

## 2010-09-22 LAB — CULTURE, ROUTINE-ABSCESS

## 2010-10-30 NOTE — Op Note (Signed)
[place]  Patient:    [name], [name] Visit Number: [id number] MRN: [id number]          Service Type: DSU Location: [id number] Attending Physician:  Dominica Severin Dictated by:   Elisha Ponder, M.D. Admit Date:  08/23/2001                             Operative Report  PREOPERATIVE DIAGNOSIS:  Right index finger purulent flexor tenosynovitis.  POSTOPERATIVE DIAGNOSIS:  Right index finger purulent flexor tenosynovitis.  PROCEDURES: 1. Irrigation and debridement, right index finger flexor tendon sheath    purulent flexor tenosynovitis. 2. Irrigation and debridement, entrance site from a penny nail about the    radial border of the right index finger.  SURGEON:  Elisha Ponder, M.D.  ASSISTANT:  Ralene Bathe, P.A.  COMPLICATIONS:  None.  ANESTHESIA:  General.  CULTURES:  Taken x2.  TOURNIQUET TIME:  Less than an hour.  INDICATION FOR PROCEDURE:  The patient is a 45 year old male who presents with the above-mentioned diagnosis.  He has had 1 g of Rocephin as well as first-generation cephalosporin in the form of Keflex over the last 36 hours and has failed to adequately respond.  I feel that he has an early flexor tenosynovitis of a purulent nature and discussed the risks and benefits of surgery.  As he has failed antibiotic treatment, I have recommended I&D and exploration.  He understands this and the risks and benefits and desires to proceed.  The patient does have a noted PENICILLIN allergy; however, he has tolerated Rocephin and Keflex quite well.  He understands the risks and benefits of surgery and desires to proceed.  All questions have been encouraged and answered preoperatively.  OPERATIVE FINDINGS:  This patient had a large amount of tenosynovial fluid in the flexor sheath.  This was cultured.  He was on antibiotics prior to the culture.  He had signs and symptoms of purulent flexor tenosynovitis and was I&Dd  appropriately with through-and-through irrigation system.  DESCRIPTION OF PROCEDURE:  The patient was seen by myself and anesthesia.  He was taken to the operative suite, underwent a smooth induction of general anesthesia, was then laid supine, appropriately padded, prepped and draped in the usual sterile fashion.  Ancef was given preoperatively, and he tolerated this without complication.  Once this was done the patient then underwent thorough scrub and paint about the right upper extremity.  The right upper extremity was then elevated.  The tourniquet was insufflated to 250 mmHg and an incision was made about the volar region of the A1 pulley about the right index finger.  Dissection was carried down to the A1 pulley, which was released, as well as the area proximal to this.  The patient had tenosynovial fluid egress.  I cultured this.  I also performed a local tenosynovectomy of the flexor tendons in this region, as there was a thickened tenosynovium around the flexor digitorum profundus.  After this was done I then I&Dd the puncture wound from the penny nail which initiated the series of events leading to the operation.  This was I&Dd down to the flexor sheath region. This was noted under 4.0 loupe magnification.  I I&Dd this area and did not see any obvious foreign body.  Following this I then made a small incision over the DIP joint volarly, taking care to avoid the neurovascular bundles.  I opened the flexor  sheath at this region.  Following this, the through-and-through irrigation with pediatric feeding tube was applied.  The patient had anterior catheter placed at the distal end following this through-and-through irrigation, was placed at the flexor sheath.  This egressed nicely at the DIP region, where the sheath was opened.  I did check the competency of the FDP and FTS tendons.  They were noted to be intact. Following checking the competency of the tendons, I then continued  the irrigation, upgraded in 200 cc of fluid.  Following this, the patient then had the distal DIP region packed with iodoform to allow for egress of fluid.  The pediatric feeding tube was sutured in proximally at the proximal incision, and iodoform was placed in the area where the penny nail entered his sheath.  The tourniquet was deflated for this, of course, and for the irrigation of the fluid.  He had excellent refill and no complications.  There were no neurovascular injuries.  Will continue a continuous irrigation system for the next 24 hours and monitor his condition closely.  I will place him on Ancef as well as appropriate pain management.  The patient was stable at the conclusion of the case, and there was no immediate orthopedic complication. Dictated by:   Elisha Ponder, M.D. Attending Physician:  Dominica Severin DD:  08/23/01 TD:  08/24/01 Job: [id number]

## 2010-10-30 NOTE — Procedures (Signed)
[name], [name]                         ACCOUNT NO.:  [id number]   MEDICAL RECORD NO.:  [id number]                   PATIENT TYPE:  REC   LOCATION:  [facility]                                  FACILITY:  [facility]   PHYSICIAN:  Erick Colace, M.D.           DATE OF BIRTH:  Feb 25, 1966   DATE OF PROCEDURE:  11/01/2003  DATE OF DISCHARGE:                                 OPERATIVE REPORT   DATE OF BIRTH:  [birth year].   MEDICAL RECORD NUMBER:  #[id number]   PROCEDURES:  Left S1 transforaminal epidural steroid injection.  [name]  has had previous left S1 transforaminal epidural steroid injection on  July 29, 2003.  Feels like this worse off sometime in the last month.   Informed consent was obtained.  Written consent form filled out.   The patient placed prone on fluoroscopy table.  Betadine prepped.  Sterile  drape, and 1.5 cc of 2% lidocaine were injected into the skin in  subcutaneous tissues with 25 gauge 1-1/4 inch needle.  A 22 gauge 3-1/2 inch  spinal needle was manipulated into the left S1 foramen under fluoroscopic  guidance.  Omnipaque 180 was injected x 0.5 cc to demonstrate transforaminal  flow, then 40 mg/cc Kenalog x 1 cc mixed with 2 cc of methyl __________ 1%  lidocaine were injected.  The patient tolerated the procedure well.  Post  injection instructions given.  Patient to follow up in one month.                                                Erick Colace, M.D.    AEK/MEDQ  D:  11/01/2003 10:07:33  T:  11/01/2003 14:10:29  Job:  [id number]

## 2010-10-30 NOTE — Consult Note (Signed)
NAME:  [name], [name]                         ACCOUNT NO.:  [id number]   MEDICAL RECORD NO.:  [id number]                   PATIENT TYPE:  REC   LOCATION:  [facility]                                  FACILITY:  [facility]   PHYSICIAN:  Zachary George, DO                      DATE OF BIRTH:  [birth year]   DATE OF CONSULTATION:  DATE OF DISCHARGE:                                   CONSULTATION   Mr. Alcock returns to the clinic today for re-evaluation.  He was initially  seen on 06/11/02.  He continues to complain of lower back pain radiating  into his left lower extremity with associated numbness and paresthesias in  the left posterior thigh and calf.  He is status post L4-5 diskectomy on  02/09/02 with reported epidural scarring.  I have yet to receive the MRI  report from patient's last lumbar spine MRI which he believes was in  November of 2003.  He states he has had approximately five episodes where he  has taken a step and had severe pain shooting down his leg, similar to that  when he had his herniated disk.  He also gets increased numbness and  paresthesias in his left lower extremity for several minutes with subsequent  improvement.  He has taken Bextra 20 mg daily since his last visit and he  states that it does help him in the morning with some of his stiffness and  pain.  He also has taken Zonegran 100 mg daily for neuropathic component and  states that he has not noticed any difference with that.  His pain today is  7/10 on subjective scale.  He denies bowel or bladder dysfunction.  I have  reviewed health and history form and 14 point review of systems.   PHYSICAL EXAMINATION:  Reveals a healthy male in no acute distress.  There  is no atrophy noted in the lower extremities bilaterally.  Mini muscle  testing is 5/5 bilateral lower extremities with give way weakness in the  left lower extremity with hip flexion, knee extension, ankle dorsiflexion  and ankle plantar flexion secondary to  increased radicular type pain and  lower back pain.  Sensory examination reveals decreased light touch in the  posterior thigh and calf on the left.  Muscle stretch reflexes are 2+/5 in  the right patella, bilateral medial hamstrings and bilateral Achilles and  0/4 left patella.  Straight leg raise is positive on the left in the seated  position.   IMPRESSION:  1. Chronic low back pain.  2. Left lower extremity radiculitis/radiculopathy in an S1 distribution     primarily.  Loss of patellar reflex suggests L4 component.  This may be     residual secondary to L4-5 disk herniation.  3. Degenerative disk disease of the lumbar spine.   PLAN:  1. Continue Bextra 20 mg daily, #30 with  two refills.  2. Increase Zonegran to 100 mg, two p.o. q.d., #60, without refills.  3. Flexeril 10 mg one p.o. q.h.s. as needed, #30, without refills.  4. Await MRI report.  Will discuss further treatment options such as     minimally invasive injections to decrease patient's pain level.  5. Patient is to return to clinic in two weeks for re-evaluation.  6. Patient was educated in above findings and recommendations, and     understands.   There were no barriers to communication.                                               Zachary George, DO   JW/MEDQ  D:  06/25/2002  T:  06/25/2002  Job:  [id number]

## 2010-10-30 NOTE — Assessment & Plan Note (Signed)
REFERRING PHYSICIAN:  Jene Every, M.D.   [name] returns today after I last saw him August 19, 2003.  He is a 45-  year-old male status post L4-5 discectomy with postoperative epidural  fibrosis.  His chronic left lower extremity radicular pain partially  responds to Topamax.  He had a good response to left S1 transforaminal  epidural steroid injection done on July 29, 2003, and just feels like it  is starting to wear off now.  He also had another injection done in  December.  He continues to take his hydrocodone but has had a very stable  dosage of 10/325 one p.o. q.a.m., one half p.o. q. noon, and one p.o. q.h.s.   He continues on nortriptyline 50 mg p.o. q.h.s. for sleep and we have  started him on Flexeril 5 one p.o. t.i.d. since February of 2005 and feels  like this is helping with the muscle spasm component of his pain.  His pain  ranges from 4 to 8/10.  He complains of left leg numbness and tingling.  Pain is made better with therapy, i.e., stretching and medication; made  worse with walking, bending and sitting.   ALLERGIES:  PENICILLIN.   SOCIAL HISTORY:  Half a pack a day smoker.  Has cut down from a pack a day.  He is married.  He watches his children during the day.  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  He is pursuing vocational rehab, actually has an  appointment scheduled on Nov 03, 2003.   REVIEW OF SYMPTOMS:  Weakness, numbness, in left lower extremity.  Spasms in  his back.  Depression and poor sleep but no suicidal thoughts.  He has  gained some weight.   PHYSICAL EXAMINATION:  VITAL SIGNS:  Blood pressure 142/84, pulse 94,  respiratory rate 14, O2 saturation 97% on room air.  NEUROLOGIC:  Gait is antalgic when he first gets up, shuffles a bit.  His  affect is alert.  Appearance is normal, however, he does have multiple  piercings and tattoos.   His back has no tenderness to  palpation except at the left PSIS area.  He  has 50% forward flexion and 50% extension, lateral rotation and bending.  He  has no pain on palpation of either lower extremities.  He has reduced  sensation below the knee on the left side to light touch and pinprick  compared to the right side.  He has full quad strength and ankle  dorsiflexors and toe extensor strength bilaterally.   IMPRESSION:  1. Lumbar disc disease.  2. 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  3. Lumbar post laminectomy syndrome.   PLAN:  1. Continue Topamax 100 b.i.d.  2. Continue hydrocodone 10/325 one p.o. q.a.m., one half p.o. q. noon and     one p.o. q.h.s.  3. Continue nortriptyline 50 mg p.o. q.h.s.  4. I will follow with him after he goes to __________ rehab.  I do feel that     he could tolerate a light duty type job with 20 pound lifting     restriction.  5. I think he could benefit from another left S1 transforaminal epidural     steroid injection.  I will schedule this in the next several weeks.      [name], M.D.   AEK/MedQ  D:  10/18/2003 12:41:49  T:  10/18/2003 14:33:14  Job #:  [id number]   cc:   Jene Every, M.D.  [address]  [place]  [place] [address]  Fax: [phone]

## 2010-10-30 NOTE — Assessment & Plan Note (Signed)
[name] returns today after a left S1 transforaminal epidural steroid  injection done on July 29, 2003. He is a 45 year old male status post L4-  5 diskectomy with postoperative epidural fibrosis. He has had chronic left  lower extremity radicular pain, partially responsive to Topamax. For the  week after his injections, he was able to stop his hydrocodone completely.  He is back to his usual dose of one in the morning, one half at noon, and  one p.o. q.h.s. of the 10/325. He has been able to walk better now, does not  have shooting pain down his leg when he walks.   Average pain level is 6, going from 4 to 8/10.   SOCIAL HISTORY:  Married, not working since July of 2003, pursuing  disability, half pack per day smoker.   REVIEW OF SYSTEMS:  Positive for depression, poor sleep, weakness, numbness.   PHYSICAL EXAMINATION:  Blood pressure 131/73, pulse 95, respirations 16, O2  saturation 95%. Gait is with a limp, antalgic, favoring left. Affect is  alert. Multiple piercings. Otherwise normal.   He has full range of motion bilateral lower extremities. She has 4/5  bilateral ankle dorsi flexors, plantar flexors but states that it is the  balance that gives him problems with going up on his heels or toes.   He has full quad strength bilaterally. He has normal deep tendon reflexes  bilaterally. Normal hip range of motion. No pain to palpation in bilateral  lower extremities. He has no pain to palpation along his lumbar paraspinals  or his hip area.   IMPRESSION:  1. Lumbar degenerative disk disease.  2. Epidural fibrosis.  3. Chronic S1 nerve root irritation.  4. Lumbar post laminectomy syndrome.   PLAN:  1. Continue Topamax 100 b.i.d.  2. Continue hydrocodone 10/325 one p.o. q.a.m., one half at noon, and one     p.o. q.h.s.  3. Continue __________ 50 q.h.s.  4. Made referral to vocational rehab, explained to the patient that this is     a good time to do this, and talked  about job retraining for more     sedentary position. Feel that he could tolerate a light duty type job     with 20-pound lifting restriction.  5. No recommended reinjection at this time. We will see how long this     current injection helps him.      [name], M.D.   AEK/MedQ  D:  08/19/2003 10:29:23  T:  08/19/2003 11:11:34  Job #:  [id number]   cc:   Jene Every, M.D.  [address]  [place]  [place] [address]  Fax: [phone]

## 2010-10-30 NOTE — Procedures (Signed)
[name], [name]                         ACCOUNT NO.:  [id number]   MEDICAL RECORD NO.:  [id number]                   PATIENT TYPE:   LOCATION:                                       FACILITY:  [facility]   PHYSICIAN:  Erick Colace, M.D.           DATE OF BIRTH:  01/03/1966   DATE OF PROCEDURE:  07/29/2003  DATE OF DISCHARGE:                                 OPERATIVE REPORT   PROCEDURE:  Left S1 transforaminal epidural steroid injection.   INFORMED CONSENT/INDICATIONS:  An informed consent was obtained after a  discussion of the risks and benefits of the procedure with the patient.  He  had previously had good results from a left S1 transforaminal epidural  steroid injection on May 06, 2003.  The effects have worn off over the  last month or so.   DESCRIPTION OF PROCEDURE:  The patient was placed prone on the fluoroscopy  table.  Betadine prep and sterile drape.  A 1-1/4 inch #25 gauge needle as  used to infiltrate the skin and the subcutaneous tissues with 2 mL of 1%  lidocaine.  Then a #22 gauge 3-1/2 inch spinal needle was manipulated under  fluoroscopic guidance into the left S1 foramen.  Omnipaque 180 demonstrated  no evidence of intravascular uptake, and it showed some epidural spread, as  well as down the ventral foramen.  Then solution containing 1 mL of 40 mg  per mL of lidocaine with 1 mL of 40 mg per mL of Kenalog plus 2 mL of  methylparaben-free lidocaine 1% were injected.   DISPOSITION:  The patient tolerated the procedure well.  Post-injection  instructions given.  He is to return in three to four weeks for a follow-up  visit, and to decide on a re-injection at that time, but that would be at a  later date.                                                Erick Colace, M.D.    AEK/MEDQ  D:  07/29/2003 12:11:47  T:  07/29/2003 13:31:15  Job:  [id number]

## 2010-10-30 NOTE — Consult Note (Signed)
NAME:  KOLLIN, UDELL                         ACCOUNT NO.:  [id number]   MEDICAL RECORD NO.:  [id number]                   PATIENT TYPE:  REC   LOCATION:  [facility]                                  FACILITY:  [facility]   PHYSICIAN:  Zachary George, DO                      DATE OF BIRTH:  April 26, 1966   DATE OF CONSULTATION:  07/18/2002  DATE OF DISCHARGE:                                   CONSULTATION   HISTORY OF PRESENT ILLNESS:  Mr. Gabrielson returns to clinic today for  reevaluation. He was last seen on 06/25/02. He continues to complain of low  back and lower extremity radicular pain status post L4-5 diskectomy with  epidural scarring. The patient states that his pain has increased over the  past two weeks. He continues taking Bextra 20 mg daily which he states  helps. He also continues on Flexeril as needed which also seems to help his  sleep, but he continues to wake up three to four times per night. He has  been taking Zonegran 200 mg daily with questionable effectiveness in terms  of his radicular component which he states is improved overall. His pain is  a 7/10 on subjective scale. Function and quality of life indices have  remained declined to some degree. His sleep is poor. He describes occasional  sudden sharp stabbing pain lasting several minutes and then resolving. I  reviewed the health and history form and 14-point review of systems.   PHYSICAL EXAMINATION:  Reveals a healthy-appearing male in no acute  distress. Blood pressure 149/85, pulse 96, respirations 20, O2 saturation  96% on room air. Manual muscle testing is 5/5 bilateral lower extremities.  Sensory examination reveals decreased light touch in the posterior thigh and  calf on the left. Muscle stretch reflexes are 2+/4 right patella, bilateral  medial hamstrings, and bilateral Achilles and 0/4 left patella. Straight leg  raise is positive on the left in the seated position.   IMPRESSION:  1. Chronic low back pain.  2.  Left lower extremity radiculitis/radiculopathy in a S1 distribution. The     patient continues to have persistent loss of left L4 reflex which may be     residual secondary to the L4-5 disk herniation.  3. Degenerative disk disease of the lumbar spine.   PLAN:  1. Discuss further treatment options with Mr. Lichty. At this time, we will     continue with Bextra 20 mg daily.  2. Increase Zonegran to 300 mg daily. I have provided him with 14 sample     pills to add to his current medication supply.  3. Discontinue Flexeril and begin Pamelor 10 to 20 mg at bedtime #60 with     one refill.  4. We will provide Ultracet samples to take just as needed for pain not     controlled with the above medications.  5.  Consider a transforaminal lumbar epidural steroid injection if symptoms     are not improving. Would also consider evaluation for neuroplasty     secondary to his epidural scarring if symptoms are not improving with     epidural steroids.  6. The patient is to return to clinic in one month for reevaluation.   The patient was educated about findings and recommendations and understands.  There were no barriers to communication.                                               Zachary George, DO    JW/MEDQ  D:  07/18/2002  T:  07/19/2002  Job:  [id number]   cc:   Jene Every, M.D.  [address]  [place]  [place] [address]  Fax: [phone]

## 2010-10-30 NOTE — Assessment & Plan Note (Signed)
DATE OF BIRTH:  Apr 20, 1966.   MEDICAL RECORD NUMBER:  [id number].   Mr. Gibbons returns today after I last saw him on January 16, 2004.  UDS at  that time was negative for illicit substances.  He has had a cold the last  two weeks and some upper GI symptoms.  He really has not been taking his  pills over the last week because of nausea.  He denies taking any type of  Advil, Naprosyn or aspirin products.   MEDICATIONS:  His pain medicines include:  1.  Hydrochlorothiazide 10/325 mg one p.o. q.a.m., one-half at noon and one      p.o. q.p.m., 75 tablets every month.  Well compliant.  2.  Flexeril 5 mg p.o. t.i.d.  3.  Topamax 100 mg p.o. b.i.d.   PAIN LEVEL:  7/10 on average and going to 3-9.  Pain made worse by walking,  bending and sitting.  He continues to try to walk every day.  He has pain  and numbness in the left lower extremity, which is chronic.  Pain improves  with rest and medication.  He smokes a half of a pack a day, varied.   He last worked in July of 2003.   REVIEW OF SYSTEMS:  Positive numbness, weakness, spasms, anxiety, depression  and poor sleep.   PHYSICAL EXAMINATION:  VITAL SIGNS:  Blood pressure 136/69, pulse 106,  respirations 20, O2 saturation 96% on room air.  GENERAL APPEARANCE:  No acute distress.  Mood and affect appropriate.  BACK:  He has approximately 50% forward flexion, 25% extension and 25%  lateral rotation.  His deep tendon reflexes are normal in bilateral lower  extremities.  He has normal strength in bilateral extremities.  He is able  to go up on his toes and on his heels.  Decreased sensation in the entire  left lower extremity.   IMPRESSION:  1.  Chronic left lower extremity dysesthesias and paresthesias as post      laminectomy syndrome.  2.  Nausea.  I do not think it is related to his medications.  It sounds      like it is upper GI viral illness.  He will follow with his primary care      physician in regards to this.      Erick Colace, M.D.    AEK/MedQ  D:  03/02/2004 18:05:17  T:  03/03/2004 14:59:25  Job #:  [id number]

## 2010-10-30 NOTE — Op Note (Signed)
[facility]  Patient:    [name], [name] Visit Number: [id number] MRN: [id number]          Service Type: EXP Location: ED Attending Physician:  Carmelina Peal Dictated by:   Dominica Severin III, M.D. Proc. Date: 08/21/01 Admit Date:  08/21/2001   CC:         [name]. [name], M.D.   Operative Report  DATE OF BIRTH:  August 16, 1965  INDICATIONS FOR PROCEDURE:  I had the pleasure to see [name] in the [facility] Emergency Room upon the referral from physician assistant/Dr. [name]. This patient is a 45 year old right hand dominant male who injured his right index finger with a penny nail gun at 3 p.m. Sunday. The penny nail entered at the radial aspect of the finger over the P1 area. Subsequent to this he had pain. He worked on it all day today and noticed pain afterwards. At the present time, he states it is difficult to flex the finger fully and he notes pain over the dorsal and volar areas. He denies frank numbness or tingling at the tip of the finger. He has been given a Gram of Rocephin in the emergency room. His tetanus is up to date. He denies pain in the other fingers. He denies trauma to the left hand.  PAST MEDICAL HISTORY:  None.  PAST SURGICAL HISTORY:  Appendectomy and left finger surgery.  SOCIAL HISTORY:  The patient smokes one pack per day. He occasionally drinks.  ALLERGIES:  PENICILLIN.  MEDICATIONS:  None.  PHYSICAL EXAMINATION:  White male alert and oriented in no acute distress. Vital signs are stable. He is afebrile. He has had labs drawn, they are not back yet. His right index finger has a puncture wound over the volar radial aspect of P1. He has tenderness over this area primarily volar as well as dorsal. The patient is nontender in the palm, thenar, hypothenar and mid palm space are free from abnormality. The distal portion of the finger pulp and area over P2 are not particularly tender. The patient does not  have frank Kanavels signs at present time but is somewhat tender along the flexor sheath. The finger is not held in a sausage digit posture nor is it painful excessively with hyperextension. I reviewed these findings at length.  X-rays were reviewed which show no fracture dislocation, stress break or ______ lesion.  IMPRESSION:  Status post nail bed injury to the right index finger, rule out early flexor tenosynovitis of a purulent nature.  PLAN:  He has been given Ceftin and I have also given him Keflex 500 one p.o. q.i.d. x 10 days and Vicodin for pain. He will elevate and follow-up tomorrow with me so that I can assess the finger. We will take Keflex 500 mg one p.o. q.i.d. x 10 days and monitor the effectiveness of the antibiotics. The patient understands that if he is not improved significantly within 24-48 hours, consideration for surgical decompression will be made. I have discussed these issues with him at length. A splint was placed about the hand to prevent excessive swelling and aid in elevation tonight. He will notify me should any problems occur. All questions have been encouraged and answered. Dictated by:   Dominica Severin III, M.D. Attending Physician:  Carmelina Peal DD:  08/21/01 TD:  08/22/01 Job: [id number]

## 2010-10-30 NOTE — Op Note (Signed)
[name], [name]                        ACCOUNT NO.:  [id number]   MEDICAL RECORD NO.:  [id number]                   PATIENT TYPE:  OBV   LOCATION:  0454                                 FACILITY:  [facility]   PHYSICIAN:  Javier Docker, M.D.              DATE OF BIRTH:  Jun 18, 1965   DATE OF PROCEDURE:  02/08/2002  DATE OF DISCHARGE:  02/09/2002                                 OPERATIVE REPORT   PREOPERATIVE DIAGNOSES:  1. Herniated nucleus pulposus L4-5.  2. Spinal stenosis.  3. Degenerative disk disease.   POSTOPERATIVE DIAGNOSES:  1. Herniated nucleus pulposus L4-5.  2. Spinal stenosis.  3. Degenerative disk disease.   PROCEDURE PERFORMED:  Lateral recess decompression; lateral diskectomy L4-5,  foraminotomy L5.   ANESTHESIA:  General.   ASSISTANT:  James P. Aplington, M.D.   BRIEF HISTORY AND INDICATIONS:  A 45 year old refractory left lower  extremity radicular pain, secondary to HNP at L4-5.  The patient with  longstanding and with an exacerbation.  Recommended for the patient to  undergo a diskectomy to prevent further progression of his neurologic  deficit.  The patient had no central and to the left disk herniation  compressing the L5 nerve root.  Operative intervention was indicated for  decompression to prevent further neurologic deterioration.  Risks and  benefits were discussed, including bleeding, infection, damage to  neurovascular structures, worsening of symptoms, continuous symptoms and  need for fusion in the future, epidural fibrosis, CSF leakage, anesthesia  complications, etc.   The patient was seen in the holding room preoperatively, demonstrated  positive straight leg raise; 3/5 EHL to __________  sensation, L5-S1  dermatome.   TECHNIQUE:  The patient placed in supine position after adequate induction  of general endotracheal anesthesia.  Kefzol 1 g given.  He was placed prone  on the Barnhart frame; all bony prominences were padded.  Lumbar  region  prepped and draped in the usual sterile fashion.  Tuohy 18-gauge spinal  needle was utilized to localize the L4-5 interspace, confirmed with x-ray.  Incision was made from the spinous process of L4-5.  Subcutaneous tissue was  dissected.  Electrocautery was utilized to achieve hemostasis.  Dorsolumbar  fascia was then identified by lateral skin incision; paraspinous muscle  elevated from the lamina at L4-5.  McCullough retractor was placed.  The  Enfield __________  placed in the interlaminar space.  X-ray confirmed this.  Next, hemilaminotomy in the carotid edge of L4 was performed and a 2 mm and  3 mm Kerrison.  We first approached this laterally, decompressing the  lateral recess and performing a partial medial hemifasciotectomy and a  foraminotomy of the L5.  We identified the L5 nerve root in the foramen.  Decompressed the lateral recess and basically approached it lateral to the  thecal sac to avoid any traction upon thecal sac and nerve root.  I removed  the ligament of Flavum  piecemeal from the interlaminar space with a 2 mm  Kerrison meticulously.  Following the removal of the ligament of Flavum, the  hemilaminotomy and partial mediahemifasciotectomy, we gently lifted the L5  nerve root from the thecal sac, with significant tension noted upon each and  there was a large HNP beneath this.  I performed an annulotomy with the  nerve hook.  I removed a large subannular fragment from the subannular  space.  This decompressed the thecal sac and the nerve root.  I gently  mobilized it more medially without the tension upon that, and mobilized  multiple fragments of disk from that.  There appeared to be a central bony  ridge with further mobilization on the groove with the radiograph; there was  slight retrolisthesis at L4 and L5.  I mobilized medially with Epstein curet  and a hockey stick, producing two more smaller fragments of disk material.  Cephalad to the disk space of the  caudate; however, there was no disk  material noted.  There was just bony ridge medially.  I fully decompressed  then and removed the large fragment from the central portion with a biting  pituitary fit.  I performed a diskectomy.  Following this, in the  foraminotomy of L5 there was 1 cm of excursion of the L5 nerve root without  significant tension.  I placed a hockey stick probe and the foramen at L5  was well patent.  Bipolar electrocautery was utilized to achieve hemostasis.  I then copiously irrigated the disk space.  I felt that the full diskectomy  was performed without residual disk material; checking the axilla of the  nerve root into both foramen and beneath the thecal sac -- there is no  evidence of residual herniated disk material.  I copiously irrigated again  and felt this was well decompressed.  No evidence of CSF leakage or active  bleeding; placed thrombin-soaked Gelfoam __________  defect.  Removed the  patella retractor.  Paraspinous muscle inspected; no evidence of active  bleeding.  Dorsolumbar fascia reapproximated with #1 Vicryl with figure-of-  eight sutures.  Subcutaneous tissue reapproximated with 2-0 __________ .  Skin was reapproximated with 4-0 subcuticular Prolene.  The wounds were  reinforced with Steri-Strips.  Sterile dressing was applied.  The patient  was placed supine on hospital bed, extubated without difficulty, and  transported to the recovery room in satisfactory condition.                                               Javier Docker, M.D.    JCB/MEDQ  D:  02/08/2002  T:  02/09/2002  Job:  [id number]

## 2010-10-30 NOTE — Consult Note (Signed)
[name], [name]                         ACCOUNT NO.:  [id number]   MEDICAL RECORD NO.:  [id number]                   PATIENT TYPE:  REC   LOCATION:  TPC                                  FACILITY:  [facility]   PHYSICIAN:  Drew George, DO                      DATE OF BIRTH:  Mar 13, 1966   DATE OF CONSULTATION:  06/11/2002  DATE OF DISCHARGE:                                   CONSULTATION   NOTE:  Thank you very much for kindly [facility].  The patient was  evaluated in our clinic today.  Please refer to the following for details  regarding the history, physical examination, and treatment plan.  Once  again, thank you for allowing [place] to participate in the care of this patient.   CHIEF COMPLAINT:  Low back pain and left lower extremity pain.   HISTORY OF PRESENT ILLNESS:  The patient is a pleasant 45 year old right  hand dominant male who previously worked as a Academic librarian who suffered a  herniated disk at L4-5 in 01/2002.  MRI at that time revealed a L4-5 complex  disk herniation having two large components, the broad-based component has  greater extension at the right posterolateral position where as the more  focal component originates from the broad-based component and is most  notable centrally.  This combines to cause thecal sac compression.  Mild  bilateral neural foraminal narrowing also noted at this level.  There is  also a broad-based right posterolateral L3-4 disk protrusion with slight  caudal extension causing mass effect upon the right ventral aspect of the  thecal sac.  The patient complained of low back pain radiating to his left  lower extremity, primarily in the posterior thigh and calf to his foot.  He  underwent a left S1 transforaminal epidural steroid injection prior to  surgery per Dr. Ethelene Hal which gave him relief only for a short period of time  and by his description sounds like the  anesthetic phase, as his pain  returned later on that evening.  He then underwent L4-5 diskectomy on  02/09/2002 without any significant improvement in his back or lower extremity  pain.  He states that a repeat MRI was performed which revealed scar tissue.  I do not have a copy of this MRI to review.  He was treated with an oral  steroid taper without any improvement.  His pain today is an 8/10 on a  subjective scale and described as constant, stabbing, with associated  weakness and tingling in his lower extremity involving the posterior thigh,  calf, to his foot.  His low back pain does seem to be somewhat worse than  his lower extremity pain; however, he describes both as very severe.  His  symptoms are worse with walking, bending, sitting, working, and  improved  with heat.  He was previously taking various medications per Dr. Shelle Iron,  including Percocet, Ativan, Robaxin, Vicodin, and Mobic.  He states the  Vicodin had made his sick which is the reason for taking the Percocet which  he said took the edge off.  He also states that Mobic did help to some  degree.  He has most recently been taking Aleve which has taken the edge off  modestly.  He has not had any physical therapy to this point.  His function  and quality of life indices have declined.  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  He denies any fevers, chills, night  sweats, weight loss, or bowel or bladder dysfunction, with the exception of  constipation.  His sleep has been poor, primarily secondary to pain.  I  reviewed the health and history form and fourteen point review of systems.  The patient does admit to some depressive symptoms secondary to the  prolonged nature of his pain.   PAST MEDICAL HISTORY:  Denies.   PAST SURGICAL HISTORY:  1.  Appendectomy.  2. Left finger reattached, right index finger surgery/reconstruction.  3. Lumbar diskectomy at L4-5.   FAMILY HISTORY:  Denies.   SOCIAL HISTORY:  The patient smokes one-half pack of cigarettes per day but  states this is decreased from three to four packs per day previously.  He  admits to social alcohol use.  He denies illicit drug use.  He is married  and has two young sons.  He is not currently working and does not have any  litigation pending.   ALLERGIES:  PENICILLIN.   MEDICATIONS:  Aleve.   PHYSICAL EXAMINATION:  GENERAL:  Examination reveals a healthy male in no  acute distress.  VITAL SIGNS:  Blood pressure 150/98 manually, pulse 69, respirations 20, O2  saturation 96%.  BACK:  Examination of the patient's back reveals a level pelvis without  scoliosis.  There is a small midline vertical incisional scar which is well-  healed.  There is decreased lumbar lordosis.  Palpatory examination reveals  significant tenderness to palpation in the lumbar paraspinous muscles  bilaterally with tightness.  Range of motion of the lumbar spine is guarded  in all planes secondary to pain.  There is minimal forward flexion noted.  Manual muscle testing if 5/5 bilateral lower extremities, with the exception  of 4+/5 bilateral hip flexor strength secondary to pain inhibition.  Sensory  examination reveals slight decrease to light touch in the left toes,  primarily in the lateral two toes.  Muscle stretch reflexes are 2+/4  bilateral medial ham strings, Achilles, and right patellar and 0/4 left  patellar.  Straight leg raise is positive on the left at 45 degrees and  negative on the right.  Saber is negative bilaterally.  There is no heat,  erythema, or edema in the lower extremities.  Dorsalis pedis pulses are  present bilaterally.  Gait is normal.   IMPRESSION:  1. Chronic low back pain with left lower extremity radicular symptoms in an    S1 distribution.  However, the  absent left patellar reflex suggests an L4     component.  There is epidural fibrosis per the patient's report which is     likely contributing to his  lower extremity radicular symptoms.  2. Degenerative disk disease of the lumbar spine.   PLAN:  1. I discussed the treatment options with the patient.  Initially I would     like to gather further records to include the most recent MRI of his     lumbar spine.  2. Will begin Bextra 20 mg one p.o. daily, #15 sample pills given.  3. Begin Zonegran 100 mg one p.o. daily, #14 sample pills provided.  4. Consider physical therapy if the patient's symptoms are not improving.  5. Consider transforaminal epidural steroid injections on the left at L5-S1     and possibly even L4-5 and S1 based on the patient's symptoms.  I would,     again, like to review the MRI before prior planning.  6. Consider TENS unit.  7. Follow up in two weeks.  8. Maintain contact with Dr. Shelle Iron.   The patient was educated about the findings and recommendations and  understands.  There were no barriers to communication.                                               Drew George, DO    JW/MEDQ  D:  06/11/2002  T:  06/11/2002  Job:  [id number]   cc:   Jene Every, M.D.  [address]  [place]  [place] [address]  Fax: [phone]

## 2010-10-30 NOTE — Assessment & Plan Note (Signed)
DATE OF BIRTH:  Nov 29, 1965.   MEDICAL RECORD NUMBER:  [id number].   [name] returns today after I last saw him on December 02, 2003.  UDS at  that time revealed marijuana.  Had the patient come in for another UDS today  prior to dispensing any further hydrocodone.  He has been inconsistently  taking his nortriptyline, in fact he has been out for a couple of weeks.  He  feels like the cramping pain down his left lower extremity has increased.  He continues on the Topamax 100 mg p.o. b.i.d.  He continues taking  Flexeril, although this has been more on a p.r.n. basis rather than on a  regular basis.  He continues to try to walk on a regular basis.  He has not  followed up with rehabilitation.   Last transforaminal epidural sternoid injection was on Nov 01, 2003.   SOCIAL HISTORY:  No changes.  Not working.  Helps care for children at home.   PHYSICAL EXAMINATION:  No acute distress.  Mood and affect appropriate.  The  back has no tenderness to palpation.  His lower extremities have no evidence  of edema and no erythema.  He has no joint swelling in the knees, ankles, or  feet.  He has good range of motion of bilateral lower extremities.  Decreased sensation in nonderatome fashion in entire left lower extremity.   The right lower extremity has normal sensation.  He has normal strength in  bilateral lower extremities.  Deep tendon reflexes are difficult to elicit  in bilateral knees, but he has difficulty relaxing his quadriceps.  He does  have intact DTRs in bilateral ankles.   IMPRESSION:  History of left L4-5 laminectomy with post laminectomy  syndrome, epidural fibrosis, and with radiculitis.   PLAN:  1. Continue Topamax 100 mg p.o. b.i.d.  2. UDS today.  If negative for illicit substances, will continue on Norco.     Will reduce his dosage somewhat so that we dispense a total of 75 per     month down from 90.  3. Continue nortriptyline.  Emphasize the need to take this on  a nightly     basis 50 mg q.h.s.  4. Continue walking for exercise.  5. Encourage follow up with vocational rehabilitation.  I think he can be     employed with 20-pound lifting restriction.      Erick Colace, M.D.   AEK/MedQ  D:  01/16/2004 13:51:07  T:  01/16/2004 14:47:18  Job #:  [id number]   cc:   Jene Every, M.D.  [address]  [place]  [place] [address]  Fax: [phone]

## 2010-10-30 NOTE — Assessment & Plan Note (Signed)
REFERRING PHYSICIAN:  Jene Every, M.D.   MEDICAL RECORD NUMBER:  [id number].   DATE OF BIRTH:  1965-12-21.   Mr. Bartell returns today after I last saw him for an injection left S1  transforaminal epidural steroid injection on Nov 01, 2003.  He has had  improvements in his sleep and increase in activity level since that time.  He states his average pain level is 7 going from 5 to 10/10. He has an  appointment with voc. rehab on December 11, 2003.  His pain improves with rest,  heat, and medication; made worst with walking, bending and sitting.  The  pain is primarily across the low back and into the left leg.  He has noted  some increased left lower extremity pain since coming off Flexeril.   SOCIAL HISTORY:  Not working since July of 2003. Smokes half pack a day x20  years.  Married.  His wife is a patient here.   REVIEW OF SYMPTOMS:  Weakness, numbness, spasms, anxiety, depression, poor  sleep, swelling, weight gain, frequent urination.   PHYSICAL EXAMINATION:  GENERAL:  No acute distress.  Mood and affect  appropriate.  VITAL SIGNS:  Blood pressure 146/83, pulse 76, respiratory rate 20, O2  saturation 95% on room air.  NEUROLOGIC:  Gait is without evidence of toe drag or knee instability.  He  has some tenderness to palpation of lower back around L5.  He is able to  touch his knees bending forward, extension about 25%, lateral bending is  about 25%.  His motor strength is 5/5 bilateral hip flexors, knee extensors,  ankle dorsiflexors.  He has decreased left L3 deep tendon reflexes but has  some difficulty with relaxation.  Right L3 intact.  Bilateral S1 intact.  Sensation reduced over entire left lower extremity under __________ pattern.   IMPRESSION:  1. Lumbar post laminectomy syndrome.  2. History of chronic axial back pain.  3. Improvement, status post S1 transforaminal injection.   PLAN:  1. Continue Topamax 100 b.i.d.  2. He is getting some problems with night  time pain and thinks that the     hydrocodone is not helping quite as much.  He is on 10/325 one q.a.m.,     one half q. noon and one q.h.s.  Will increase to t.i.d. one tablet.  3. Nortriptyline 50 mg q.h.s.  He is actually taking this more on a p.r.n.     basis.  4. Restart Flexeril.  5. Urine drug screen today.      Erick Colace, M.D.   AEK/MedQ  D:  12/02/2003 10:02:29  T:  12/02/2003 11:48:13  Job #:  [id number]   cc:   Jene Every, M.D.  [address]  [place]  [place] [address]  Fax: [phone]

## 2010-10-30 NOTE — H&P (Signed)
[place]  Patient:    [name], [name] Visit Number: [id number] MRN: [id number]          Service Type: DSU Location: [id number] Attending Physician:  Dominica Severin Dictated by:   Alexzandrew L. Perkins, P.A.-C. Admit Date:  08/23/2001                           History and Physical  CHIEF COMPLAINT: Right index finger pain.  HISTORY OF PRESENT ILLNESS: The patient is a 45 year old male, who works as a Psychologist, occupational by trade.  The patient sustained a nail injury to the right index finger this past Sunday that caused a small puncture wound.  He was seen in the emergency department on the following day, Monday, and evaluated by Dr. Onalee Hua and the ER staff.  He received a dose of IV antibiotics and was placed on p.o. antibiotics.  He was followed back up this morning in the office, August 23, 2001, at [facility], and found to have continued pain and swelling, with no improvement.  It was felt the patient had a purulent flexor tenosynovitis and required surgical debridement and intervention.  Risks and benefits of this procedure were discussed with the patient.  He has elected to proceed with surgery.  The patient is subsequently admitted to [facility]. [facility].  ALLERGIES: PENICILLIN causes some swelling and itching; however, the patient was able to take Keflex without difficulty.  CURRENT MEDICATIONS:  1. Keflex.  2. Vicodin.  PAST MEDICAL HISTORY: Negative.  PAST SURGICAL HISTORY:  1. Appendectomy.  2. 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  SOCIAL HISTORY: He is married and has four children.  One pack per day smoker. Only occasional intake of alcohol a few times a week.  Again, he works as a Psychologist, occupational by trade.  REVIEW OF SYSTEMS: GENERAL: The patient has had some fevers.  No chills or night sweats, though.  NEUROLOGIC: No seizures, syncope, or paralysis. RESPIRATORY: No shortness of breath,  productive cough, or hemoptysis. CARDIOVASCULAR: No chest pain, angina, or orthopnea.  GI: No nausea, vomiting, diarrhea, or constipation.  GU: No dysuria, hematuria, or discharge. MUSCULOSKELETAL: Pertinent for the right index finger.  FAMILY HISTORY: Noncontributory to present illness.  PHYSICAL EXAMINATION:  VITAL SIGNS: Pulse 60, respirations 14, blood pressure 118/58.  GENERAL: The patient is a 45 year old white male, well-nourished, well-developed, appears to be in no acute distress.  He is alert and oriented and cooperative.  Short in stature.  Very pleasant.  HEENT: Normocephalic, atraumatic.  PERRL.  EOMI.  Oropharynx clear.  NECK: Supple.  No carotid bruits appreciated.  CHEST: He does have some inspiratory wheezing at the bases bilaterally.  This does clear after deep coughing and then only some fine inspiratory crackles at the bases.  Remaining lung fields appear to be clear.  HEART: Regular rate and rhythm.  No murmurs.  S1 and S2 noted.  ABDOMEN: Soft, nontender.  Bowel sounds present.  Round abdomen.  BREAST/GU/RECTAL: Not done, not pertinent to present illness.  EXTREMITIES: Right hand and right index finger, the patient has a puncture wound in about the mid shaft region on the radial side of the right index finger in the PIP region.  This is closed over.  There is no active drainage. He does have some swelling noted extending from the MCP joint down to the DIP joint.  He has painful motion, especially of the  palm and in the palmar side of the right index finger.  Sensation is intact.  IMPRESSION: Right index finger purulent flexor tenosynovitis.  PLAN: The patient will be admitted to [facility]. [facility] to undergo I&D of the right index finger.  Surgery is to be performed by Dr. Onalee Hua. Dictated by:   Alexzandrew L. Perkins, P.A.-C. Attending Physician:  Dominica Severin DD:    /  / TD:  08/24/01 Job: [id number]

## 2010-10-30 NOTE — Consult Note (Signed)
NAME:  [name], [name]                         ACCOUNT NO.:  [id number]   MEDICAL RECORD NO.:  [id number]                   PATIENT TYPE:  REC   LOCATION:  [facility]                                  FACILITY:  [facility]   PHYSICIAN:  Zachary George, DO                      DATE OF BIRTH:  12-14-1965   DATE OF CONSULTATION:  09/14/2002  DATE OF DISCHARGE:                                   CONSULTATION   [name] returns to clinic today for reevaluation. He was last seen on  07/18/02. The patient states that he was doing fairly well until the last week  or so when his pain in his back and in his lower extremity started to worsen  significantly. He states that his pain feels like it did when he ruptured  his disk. His pain is 9/10 on a subjective scale. He has been taking  Zonegran 200 mg daily with questionable effectiveness. He also continues  taking Ultracet two pills at a time, up to four times a day with minimal  improvement. He continues on Bextra 20 mg daily without significant  improvement. His sleep has improved with Pamelor 10 mg. I reviewed the  health and history form and 14-point review of systems. Function and quality  of life indices have declined. Sleep is poor. The patient denies any  particular weakness in his lower extremities. Denies any numbness or  paresthesias. His symptoms are essentially in the same location; however,  more intense.   PHYSICAL EXAMINATION:  Reveals a healthy appearing male in no acute  distress. Blood pressure 151/78, pulse 98, respirations 20, O2 saturation  98% on room air. No new neurologic findings in the lower extremities  including motor, sensory, and reflexes. Straight leg raise remains positive  on the left in the seated position.   IMPRESSION:  1. Chronic low back pain with left lower extremity radiculitis/radiculopathy     with exacerbation.  2. Degenerative disk disease of the lumbar spine, status post L4-5     diskectomy with epidural  scarring.   PLAN:  1. Discussed further treatment options with [name]. We will start him     on a 12-day prednisone taper 10 mg six per day for two days, 5 per day     for two days, 4 per day for two days, three per day for two days, two per     day for two days, and one per day for two days, #42 dispensed.  2. Instructed the patient to discontinue Bextra while on his prednisone     taper.  3. Will begin Norco 10 mg/325 mg one half to one p.o. t.i.d. as needed #50     without refills. He may alternate this with Ultracet.  4. Continue Pamelor 10 mg one to two p.o. q.h.s. #60 with two refills.  5. The patient to return to  clinic in one month for reevaluation. He is     instructed to return to clinic sooner as needed. Would consider     transforaminal epidural steroid injection versus referral for evaluation     for neuroplasty, if symptoms are not improving.   The patient was educated about findings and recommendations and understands.  There were no barriers to communication.                                               Zachary George, DO    JW/MEDQ  D:  09/14/2002  T:  09/17/2002  Job:  [id number]   cc:   Jene Every, M.D.  [address]  [place]  [place] [address]  Fax: [phone]

## 2010-11-26 ENCOUNTER — Encounter: Payer: Self-pay | Admitting: Family Medicine

## 2010-11-26 ENCOUNTER — Ambulatory Visit (INDEPENDENT_AMBULATORY_CARE_PROVIDER_SITE_OTHER): Payer: Medicare Other | Admitting: Family Medicine

## 2010-11-26 VITALS — BP 132/89 | HR 91 | Temp 97.9°F | Ht 70.0 in | Wt 276.1 lb

## 2010-11-26 DIAGNOSIS — J069 Acute upper respiratory infection, unspecified: Secondary | ICD-10-CM

## 2010-11-26 DIAGNOSIS — J40 Bronchitis, not specified as acute or chronic: Secondary | ICD-10-CM | POA: Insufficient documentation

## 2010-11-26 DIAGNOSIS — E669 Obesity, unspecified: Secondary | ICD-10-CM

## 2010-11-26 DIAGNOSIS — F17200 Nicotine dependence, unspecified, uncomplicated: Secondary | ICD-10-CM

## 2010-11-26 MED ORDER — ALBUTEROL SULFATE HFA 108 (90 BASE) MCG/ACT IN AERS: 2.0000 | INHALATION_SPRAY | Freq: Four times a day (QID) | RESPIRATORY_TRACT | Status: DC | PRN

## 2010-11-26 MED ORDER — DOXYCYCLINE HYCLATE 100 MG PO TABS: 100.0000 mg | ORAL_TABLET | Freq: Two times a day (BID) | ORAL | Status: AC

## 2010-11-26 NOTE — Progress Notes (Signed)
  Subjective:    Patient ID: [name], male    DOB: [birth year], 45 y.o.   MRN: [id number]  HPI Work-in appt:  4 day history of URI symptoms.  Notes has been coughing mucous, has facial pressure, nasal congestion, dyspnea.  Has been taking mucinex for his chest congestion causing pain in his chest when he coughs.  No fever, but has had some chills, body aches.  States has not gotten much sleep in the past few days due to being up coughing. Not improving.  Smokes about 1 PPD since 45 years old.  Has tried to stop smoking before and had consultations with smoking cessation counselors but non here at this office or recently.      Review of Systems As per hpi    Objective:   Physical Exam  Constitutional: He appears well-developed and well-nourished.   GEN: Alert, but at times somnolent, easily arousable & Oriented, No acute distress CV:  Regular Rate & Rhythm, no murmur Respiratory:  Normal work of breathing, no wheezes, decreased BS overall Abd:  + BS, soft, no tenderness to palpation Ext: 1+ pre-tibial edema with pain on palpation due to peripheral neuropathy.  Patient states this is better than baseline for him.    Walking pulse ox down to 88% with HR >120    Assessment & Plan:

## 2010-11-26 NOTE — Assessment & Plan Note (Addendum)
Counseled on smoking cessation, weight loss as these likely play a role in his hypoxia.  Somewhat somnolent today, but patient states lives with wife and teenage sons who are able to observe him and bring him to hospital if he worsens overnight.  PCN allergy, will use doxycycline x 7 days.

## 2010-11-26 NOTE — Patient Instructions (Addendum)
Take all of antibiotics If you have more trouble breathing, or get sicker, please return to office or go to ER Stopping smoking will help preserve your lung function- your oxygen is low today! Make an appointment with Dr. Raymondo Band for smoking cessation cousneling

## 2010-11-27 NOTE — Assessment & Plan Note (Signed)
Discussed role of obesity in decreased respiratory reserve.  He also is concerned about sleep apnea for some time.  Once over acute episode of bronchitis, advised him to reschedule appt to discuss further counseling and consideration for further testing.

## 2010-11-27 NOTE — Assessment & Plan Note (Signed)
Has tried quitting before, has been successful for period as long as 6 weeks.  He states he is considering attempting quitting again.  Discussed making appt with Dr. Raymondo Band for counseling/planning.

## 2011-03-08 LAB — CBC
HCT: 44.2
MCV: 90.1
Platelets: 265
RDW: 12.7
WBC: 9.8

## 2011-03-08 LAB — DIFFERENTIAL
Basophils Absolute: 0.1
Basophils Relative: 1
Eosinophils Absolute: 0.3
Eosinophils Relative: 3
Lymphs Abs: 3
Neutrophils Relative %: 60

## 2011-03-08 LAB — BASIC METABOLIC PANEL
BUN: 18
Chloride: 102
Creatinine, Ser: 1.04
Glucose, Bld: 127 — ABNORMAL HIGH
Potassium: 4.2

## 2011-03-15 LAB — DIFFERENTIAL
Basophils Absolute: 0
Basophils Relative: 0
Basophils Relative: 1
Eosinophils Absolute: 0.1
Eosinophils Relative: 1
Monocytes Relative: 5
Neutro Abs: 6.8
Neutrophils Relative %: 68
Neutrophils Relative %: 89 — ABNORMAL HIGH

## 2011-03-15 LAB — POCT CARDIAC MARKERS
CKMB, poc: 2
CKMB, poc: 2.5
CKMB, poc: 5.1
Myoglobin, poc: 171
Myoglobin, poc: 74

## 2011-03-15 LAB — BASIC METABOLIC PANEL
CO2: 24
Calcium: 8.7
Creatinine, Ser: 0.85
GFR calc Af Amer: >60

## 2011-03-15 LAB — COMPREHENSIVE METABOLIC PANEL
ALT: 39
AST: 18
Alkaline Phosphatase: 50
CO2: 22
Chloride: 103
GFR calc Af Amer: >60
GFR calc non Af Amer: >60
Glucose, Bld: 160 — ABNORMAL HIGH
Potassium: 4.3
Sodium: 136

## 2011-03-15 LAB — D-DIMER, QUANTITATIVE
D-Dimer, Quant: 0.54 — ABNORMAL HIGH
D-Dimer, Quant: 0.79 — ABNORMAL HIGH

## 2011-03-15 LAB — CBC
HCT: 42.7
Hemoglobin: 14.4
MCHC: 33.7
MCHC: 34.2
MCV: 93.2
Platelets: 219
RBC: 4.58
RBC: 4.76

## 2011-03-15 LAB — URIC ACID: Uric Acid, Serum: 5.7

## 2011-03-15 LAB — SEDIMENTATION RATE: Sed Rate: 55 — ABNORMAL HIGH

## 2011-03-15 LAB — CK: Total CK: 106

## 2011-03-15 LAB — RHEUMATOID FACTOR: Rhuematoid fact SerPl-aCnc: <20

## 2011-04-06 ENCOUNTER — Ambulatory Visit (INDEPENDENT_AMBULATORY_CARE_PROVIDER_SITE_OTHER): Payer: Medicare Other | Admitting: Family Medicine

## 2011-04-06 ENCOUNTER — Encounter: Payer: Self-pay | Admitting: Family Medicine

## 2011-04-06 VITALS — BP 130/87 | HR 101 | Temp 97.7°F | Ht 68.0 in | Wt 278.0 lb

## 2011-04-06 DIAGNOSIS — Z23 Encounter for immunization: Secondary | ICD-10-CM

## 2011-04-06 DIAGNOSIS — L0293 Carbuncle, unspecified: Secondary | ICD-10-CM

## 2011-04-06 DIAGNOSIS — L0292 Furuncle, unspecified: Secondary | ICD-10-CM

## 2011-04-06 MED ORDER — SULFAMETHOXAZOLE-TRIMETHOPRIM 800-160 MG PO TABS: 1.0000 | ORAL_TABLET | Freq: Two times a day (BID) | ORAL | Status: AC

## 2011-04-06 NOTE — Progress Notes (Signed)
  Subjective:    Patient ID: [name], male    DOB: Aug 12, 1965, 45 y.o.   MRN: [id number]  HPI 1.  abscess left groin: Patient noted a bump on his inner thigh that started a few days ago. It has grown in size. He gets recurrent abscesses or boils. He states sometimes they resolve on their own other times he needs to have been drained. No trauma to the area. Has not noticed any draining from this abscess currently. He denies any fever, abdominal pain, nausea or vomiting. No chills.   Review of Systems See HPI above for review of systems.       Objective:   Physical Exam Gen:  Alert, cooperative patient who appears stated age in no acute distress.  Vital signs reviewed. Skin:  2 x 3 cm indurated and fluctant abscess in skin in Left medial aspect of thigh directly adjacent to groin.  Tender to palpation.  No drainage noted.  Some overlying cellulitis noted       Assessment & Plan:

## 2011-04-06 NOTE — Patient Instructions (Signed)
Your antibiotic is:  Bactrim.  Make sure to take this twice a day for the next 7 days. Make a follow-up appointment to be seen in 5 days.  We will replace or remove the packing at that time.  If the packing falls out that's okay. Keep the bandage on for today.   Tomorrow wash area off in the shower.  You can shower every day to clean the area. Put some Neosporin on a new, clean gauze and cover the area.  Tape it.   Make sure to change the dressing daily.

## 2011-04-06 NOTE — Assessment & Plan Note (Addendum)
New abscess.  See Procedure note below.  Drained without complication.  Due to some overlying cellulitis will treat with 7 day course of Bactrim.  To FU if no improvement.    Informed consent given and signed copy in chart. Appropraite time out taken. Area prepped and draped in usual sterile fashion. Local anasthesia with 2% lidocaine with epi (8 cc). Small 1 cm incision made horizontally across center of mass.  Blunt dissection and pressure used to evacuate purulent and serosanguinous material.  Area was cleaned and topical abx ointment applied. Bandage applied.  No complications. EBL < 5 cc.

## 2011-05-19 ENCOUNTER — Ambulatory Visit (INDEPENDENT_AMBULATORY_CARE_PROVIDER_SITE_OTHER): Payer: Medicare Other | Admitting: Family Medicine

## 2011-05-19 ENCOUNTER — Encounter: Payer: Self-pay | Admitting: Family Medicine

## 2011-05-19 DIAGNOSIS — M25512 Pain in left shoulder: Secondary | ICD-10-CM

## 2011-05-19 DIAGNOSIS — M25519 Pain in unspecified shoulder: Secondary | ICD-10-CM

## 2011-05-19 NOTE — Patient Instructions (Signed)
Bursitis Bursitis is a swelling and soreness (inflammation) of a fluid-filled sac (bursa) that overlies and protects a joint. It can be caused by injury, overuse of the joint, arthritis or infection. The joints most likely to be affected are the elbows, shoulders, hips and knees. HOME CARE INSTRUCTIONS   Apply ice to the affected area for 15 to 20 minutes each hour while awake for 2 days. Put the ice in a plastic bag and place a towel between the bag of ice and your skin.   Rest the injured joint as much as possible, but continue to put the joint through a full range of motion, 4 times per day. (The shoulder joint especially becomes rapidly "frozen" if not used.) When the pain lessens, begin normal slow movements and usual activities.   Only take over-the-counter or prescription medicines for pain, discomfort or fever as directed by your caregiver.   Your caregiver may recommend draining the bursa and injecting medicine into the bursa. This may help the healing process.   Follow all instructions for follow-up with your caregiver. This includes any orthopedic referrals, physical therapy and rehabilitation. Any delay in obtaining necessary care could result in a delay or failure of the bursitis to heal and chronic pain.  SEEK IMMEDIATE MEDICAL CARE IF:   Your pain increases even during treatment.   You develop an oral temperature above 102 F (38.9 C) and have heat and inflammation over the involved bursa.  MAKE SURE YOU:   Understand these instructions.   Will watch your condition.   Will get help right away if you are not doing well or get worse.  Document Released: 05/28/2000 Document Revised: 02/10/2011 Document Reviewed: 05/02/2009 [employer] Patient Information 2012 [employer]. 

## 2011-05-19 NOTE — Assessment & Plan Note (Signed)
Steroid injection into left shoulder with improvement of pain, strength, and rom. Follow up as needed.

## 2011-05-19 NOTE — Progress Notes (Signed)
  Subjective:    Patient ID: [name], male    DOB: December 04, 1965, 45 y.o.   MRN: [id number]  HPI Patient seen for left shoulder bursitis.  Burning pain in left shoulder that started years ago.  Had left shoulder injected in march, which lasted until a few weeks ago.  Has become progressively worse.  Pain 7-8 out of 10 at worst, 5 out of 10 now.  Just took vicodin.  No new injuries.   Review of Systems  Constitutional: Negative for fever, chills, diaphoresis and fatigue.  Musculoskeletal: Positive for back pain, joint swelling, arthralgias and gait problem.       Objective:   Physical Exam  Constitutional: He is oriented to person, place, and time. He appears well-developed and well-nourished.  Musculoskeletal:       Global decreased ROM and strength in left shoulder, improved after joint injection.    Neurological: He is alert and oriented to person, place, and time. He exhibits normal muscle tone.          Assessment & Plan:

## 2011-10-26 ENCOUNTER — Emergency Department (HOSPITAL_COMMUNITY)
Admission: EM | Admit: 2011-10-26 | Discharge: 2011-10-26 | Disposition: A | Discharge status: Home / Self Care | Payer: Medicare Other | Attending: Emergency Medicine | Admitting: Emergency Medicine

## 2011-10-26 ENCOUNTER — Encounter (HOSPITAL_COMMUNITY): Payer: Self-pay | Admitting: Emergency Medicine

## 2011-10-26 DIAGNOSIS — L02419 Cutaneous abscess of limb, unspecified: Secondary | ICD-10-CM | POA: Insufficient documentation

## 2011-10-26 DIAGNOSIS — F17200 Nicotine dependence, unspecified, uncomplicated: Secondary | ICD-10-CM | POA: Insufficient documentation

## 2011-10-26 DIAGNOSIS — L02416 Cutaneous abscess of left lower limb: Secondary | ICD-10-CM

## 2011-10-26 MED ORDER — SULFAMETHOXAZOLE-TRIMETHOPRIM 800-160 MG PO TABS: 1.0000 | ORAL_TABLET | Freq: Two times a day (BID) | ORAL | Status: AC

## 2011-10-26 MED ORDER — SULFAMETHOXAZOLE-TRIMETHOPRIM 800-160 MG PO TABS: 1.0000 | ORAL_TABLET | Freq: Two times a day (BID) | ORAL | Status: DC

## 2011-10-26 MED ORDER — NAPROXEN 500 MG PO TABS: 500.0000 mg | ORAL_TABLET | Freq: Two times a day (BID) | ORAL | Status: DC

## 2011-10-26 NOTE — ED Provider Notes (Signed)
History     CSN: [id number]  Arrival date & time 10/26/11  0046   First MD Initiated Contact with Patient 10/26/11 0404      Chief Complaint  Patient presents with  . Wound Check    (Consider location/radiation/quality/duration/timing/severity/associated sxs/prior treatment) HPI Comments: Gradual onset of left proximal medial thigh pain that started approximately 2 weeks ago, gradually getting worse, associated with swelling and tenderness but no fevers vomiting or redness. Symptoms are persistent, worse with palpation. This is consistent with prior abscesses in the same location which have required incision and drainage  Patient is a 46 y.o. male presenting with wound check. The history is provided by the patient and a relative.  Wound Check     Past Medical History  Diagnosis Date  . Neuropathy     Past Surgical History  Procedure Date  . Back surgery     No family history on file.  History  Substance Use Topics  . Smoking status: Current Everyday Smoker -- 1.0 packs/day for 32 years    Types: Cigarettes  . Smokeless tobacco: Not on file  . Alcohol Use: No      Review of Systems  Constitutional: Negative for fever and chills.  Gastrointestinal: Negative for nausea and vomiting.  Skin: Positive for rash.       abscess    Allergies  Penicillins  Home Medications   Current Outpatient Rx  Name Route Sig Dispense Refill  . ALBUTEROL SULFATE HFA 108 (90 BASE) MCG/ACT IN AERS Inhalation Inhale 2 puffs into the lungs every 6 (six) hours as needed for wheezing. 1 Inhaler 0  . AMITRIPTYLINE HCL 50 MG PO TABS Oral Take 50 mg by mouth at bedtime.      . B COMPLEX PO TABS  NO INSTRUCTIONS GIVEN     . EPINEPHRINE 0.3 MG/0.3ML IJ DEVI  Use as directed for symptoms of anaphylaxis     . GABAPENTIN 600 MG PO TABS  One tab PO TID for a week, then 2 tabs PO TID. 180 tablet 11  . HYDROCODONE-ACETAMINOPHEN 7.5-325 MG PO TABS  Take 1 tablet by mouth three times a day     .  MULTIVITAMINS PO  NO INSTRUCTIONS GIVEN     . NAPROXEN 500 MG PO TABS Oral Take 1 tablet (500 mg total) by mouth 2 (two) times daily with a meal. 30 tablet 0  . FISH OIL 1000 MG PO CAPS  One by mouth three times a day     . SULFAMETHOXAZOLE-TRIMETHOPRIM 800-160 MG PO TABS Oral Take 1 tablet by mouth every 12 (twelve) hours. 20 tablet 0    BP 143/92  Pulse 101  Temp(Src) 98.5 F (36.9 C) (Oral)  Resp 24  SpO2 89%  Physical Exam  Nursing note and vitals reviewed. Constitutional: He appears well-developed and well-nourished. No distress.  HENT:  Head: Normocephalic and atraumatic.  Eyes: Conjunctivae are normal. Right eye exhibits no discharge. Left eye exhibits no discharge. No scleral icterus.  Cardiovascular: Normal rate and regular rhythm.   No murmur heard. Pulmonary/Chest: Effort normal and breath sounds normal.  Musculoskeletal: He exhibits tenderness ( To the left proximal medial thigh over abscess). He exhibits no edema.  Skin: Skin is warm and dry. He is not diaphoretic.       Fluctuant mildly tender, nonerythematous area approximately 4 cm in length, 2 cm in width, fluctuant surface    ED Course  Procedures (including critical care time)  INCISION AND DRAINAGE Performed by: Eber Hong  D Consent: Verbal consent obtained. Risks and benefits: risks, benefits and alternatives were discussed Type: abscess  Body area: Left proximal medial thigh  Anesthesia: local infiltration  Local anesthetic: lidocaine 1 % with epinephrine  Anesthetic total: 3 ml  Complexity: complex Blunt dissection to break up loculations  Drainage: purulent  Drainage amount: Moderate   Packing material: 1/4 in iodoform gauze  Patient tolerance: Patient tolerated the procedure well with no immediate complications.     Labs Reviewed - No data to display No results found.   1. Abscess of left leg       MDM  Incision and drainage performed, vital signs normal without fever or  tachycardia, wound packed, antibiotics prescribed including Bactrim and Naprosyn.   Discharge Prescriptions include:  #1 Naprosyn  #2 Bactrim       Vida Roller, MD 10/26/11 [phone]

## 2011-10-26 NOTE — Discharge Instructions (Signed)
Return to the hospital for severe or worsening pain, swelling, redness, fevers. You may remove the packing in 3 days at home or have a wound check which I would recommend.  Take Bactrim twice a day for 10 days.

## 2011-10-26 NOTE — ED Notes (Signed)
Pt alert, nad, c/o wound to left inner thigh, pt has had previous wounds to same area, area reddened, black center, non open non draining, resp even unlabored, skin pwd

## 2011-11-02 ENCOUNTER — Ambulatory Visit (INDEPENDENT_AMBULATORY_CARE_PROVIDER_SITE_OTHER): Payer: Medicare Other | Admitting: Family Medicine

## 2011-11-02 ENCOUNTER — Encounter: Payer: Self-pay | Admitting: Family Medicine

## 2011-11-02 VITALS — BP 129/89 | HR 108 | Temp 98.6°F | Ht 69.0 in | Wt 281.5 lb

## 2011-11-02 DIAGNOSIS — IMO0002 Reserved for concepts with insufficient information to code with codable children: Secondary | ICD-10-CM

## 2011-11-02 DIAGNOSIS — I872 Venous insufficiency (chronic) (peripheral): Secondary | ICD-10-CM

## 2011-11-02 DIAGNOSIS — R209 Unspecified disturbances of skin sensation: Secondary | ICD-10-CM

## 2011-11-02 DIAGNOSIS — B353 Tinea pedis: Secondary | ICD-10-CM

## 2011-11-02 DIAGNOSIS — I878 Other specified disorders of veins: Secondary | ICD-10-CM | POA: Insufficient documentation

## 2011-11-02 MED ORDER — FUROSEMIDE 20 MG PO TABS: 20.0000 mg | ORAL_TABLET | Freq: Every day | ORAL | Status: DC

## 2011-11-02 MED ORDER — TRIAMCINOLONE ACETONIDE 0.1 % EX CREA: TOPICAL_CREAM | Freq: Two times a day (BID) | CUTANEOUS | Status: DC

## 2011-11-02 NOTE — Assessment & Plan Note (Addendum)
I think that his pain is multifactorial.  One part being his neuropathy.  I explained that I'm not sure that I will be able to do a whole lot about this as multiple things by Dr. Ethelene Hal have not worked.  I think another component is the amount of edema in his legs and his venous stasis.  I am going to give him a trial of lasix to see if this helps decrease his lower extremity edema.  I think ultimately he needs to be in a compression stocking once his swelling has improved some.  The pain does not seem to be claudication and he has good pulses.  Do not think DVT as swelling is equal bilaterally.  Would like referral to [facility], will enter order.

## 2011-11-02 NOTE — Assessment & Plan Note (Signed)
Venous stasis bilaterally with stasis dermatitis.  Will give a trial of topical steroid to help with itching.

## 2011-11-02 NOTE — Progress Notes (Signed)
Addended by: Everrett Coombe on: 11/02/2011 10:09 PM   Modules accepted: Orders

## 2011-11-02 NOTE — Assessment & Plan Note (Signed)
Advised tx with lotrimin

## 2011-11-02 NOTE — Patient Instructions (Signed)
Thank you for coming in today, it was good to see you I would like for you to try the lasix to see if this improves the swelling in your leg I will see you back in two weeks to see if the swelling is improved, if so we will try compression stockings to keep this from reoccurring Try the triamcinolone on your legs and top of feet to see if this helps with itching. Try lotrimin (over the counter) on the backs of your heels and cover with lotion at night, put socks on over this to hold the moisture in. I will send over a referral for pain management clinic.

## 2011-11-02 NOTE — Progress Notes (Signed)
  Subjective:    Patient ID: [name], male    DOB: 1965/07/29, 46 y.o.   MRN: [id number]  HPI 1.  Foot/Leg pain:  Patient comes in with c/o of bilateral foot and leg pain.  Has history of neuropathy that he says is from multiple back surgeries and fibrotic scar tissue regrowth over nerves.  Followed by Dr. Ethelene Hal at [facility] ortho.  Has had multiple tx including steroid injections, narcotic pain medication and neurontin at various doses.  Pain increasing over the past few months despite above treatments.  Pain described as sensitivity to touch and burning/sharp pain in lower legs and feet.  Also with cracked heels that are painful.  Would like help with current pain and referral to pain management.   Review of Systems     Objective:   Physical Exam  Constitutional: He is oriented to person, place, and time.       Obese male, nad   Cardiovascular: Normal rate, regular rhythm and normal heart sounds.   Pulmonary/Chest: Effort normal and breath sounds normal.  Musculoskeletal: He exhibits edema (2+).       Venous stasis with hemosiderin deposition and stasis dermatitis bilaterally.  DP and PT pulses 1+ bilaterally.  He has allodynia to light palpation along leg.  R heel is cracked with thickening of the skin and nails of the R foot.  Neurological: He is alert and oriented to person, place, and time.          Assessment & Plan:

## 2011-12-01 ENCOUNTER — Encounter: Payer: Self-pay | Admitting: Family Medicine

## 2011-12-01 ENCOUNTER — Ambulatory Visit (INDEPENDENT_AMBULATORY_CARE_PROVIDER_SITE_OTHER): Payer: Medicare Other | Admitting: Family Medicine

## 2011-12-01 VITALS — BP 145/92 | HR 102 | Ht 69.0 in | Wt 281.0 lb

## 2011-12-01 DIAGNOSIS — N529 Male erectile dysfunction, unspecified: Secondary | ICD-10-CM

## 2011-12-01 DIAGNOSIS — R609 Edema, unspecified: Secondary | ICD-10-CM

## 2011-12-01 DIAGNOSIS — IMO0002 Reserved for concepts with insufficient information to code with codable children: Secondary | ICD-10-CM

## 2011-12-01 DIAGNOSIS — L84 Corns and callosities: Secondary | ICD-10-CM

## 2011-12-01 MED ORDER — UREA 40 % EX CREA: TOPICAL_CREAM | CUTANEOUS | Status: DC

## 2011-12-01 MED ORDER — TADALAFIL 20 MG PO TABS: 10.0000 mg | ORAL_TABLET | ORAL | Status: DC | PRN

## 2011-12-01 MED ORDER — GABAPENTIN 600 MG PO TABS: 600.0000 mg | ORAL_TABLET | Freq: Three times a day (TID) | ORAL | Status: DC

## 2011-12-01 NOTE — Patient Instructions (Addendum)
Thank you for coming in today, it was good to see you I have increased your gabapentin and sent this in I have sent in a prescription for a medication called urea to use on your heels I have also sent in a medication for erectile dysfunction I will see you back in 3-4 weeks to see how your heels are doing.

## 2011-12-08 ENCOUNTER — Telehealth: Payer: Self-pay | Admitting: Family Medicine

## 2011-12-08 DIAGNOSIS — L84 Corns and callosities: Secondary | ICD-10-CM

## 2011-12-08 NOTE — Telephone Encounter (Signed)
Pt states that he needs generic for the foot cream(Carmol) and cialis.  Cannot afford.  pls advise  [facility] [place]

## 2011-12-08 NOTE — Telephone Encounter (Signed)
Request routed to Dr. Ashley Royalty.  Gaylene Brooks, RN

## 2011-12-08 NOTE — Telephone Encounter (Signed)
Rx were written for generic names for medications.

## 2011-12-09 NOTE — Telephone Encounter (Signed)
Returned call and left message to call our office back.  Bralyn Folkert Ann, RN  

## 2011-12-09 NOTE — Telephone Encounter (Signed)
He can ask the pharmacy if there are medications for erectile dysfunction that his insurance would cover and I can send that in.  Regarding his feet if since he can not afford cream I will place referral to podiatry.  Referral has been placed for pain management previously, states he would like to be referred to [facility].

## 2011-12-09 NOTE — Telephone Encounter (Signed)
Pt returned call

## 2011-12-09 NOTE — Telephone Encounter (Signed)
Patient called back and informed to check with pharmacy for erectile dysfunction med that is covered by his insurance.  Patient will call our office back with name of med(s) when he finds out.  Also, informed patient that we will call him back with podiatry appt.  Will also call him back with pain management appt info--Donna Kathrine Cords, CMA working on this.  Gaylene Brooks, RN

## 2011-12-09 NOTE — Telephone Encounter (Signed)
Appt. scheduled with [facility]-Dr. Irving Shows 12/21/2011 @ 10:15am.  Referral for [facility] faxed.  Arlys John notified. Ileana Ladd

## 2011-12-09 NOTE — Telephone Encounter (Signed)
Returned call to patient and left message to call our office back.  Bruna Dills Ann, RN  

## 2011-12-09 NOTE — Telephone Encounter (Signed)
Called patient and informed that Dr. Ashley Royalty wrote Rx for generic medications.  Patient states that foot cream is $265 and "pills" are $175 and that these meds are not covered by his insurance (Medicare).  Patient wants to know if his meds can be changed to something cheaper.  Also, wants to know if his appt with pain center has been scheduled.  Will route note to Dr. Ashley Royalty and red team.  Gaylene Brooks, RN

## 2011-12-11 DIAGNOSIS — L84 Corns and callosities: Secondary | ICD-10-CM | POA: Insufficient documentation

## 2011-12-11 DIAGNOSIS — N529 Male erectile dysfunction, unspecified: Secondary | ICD-10-CM | POA: Insufficient documentation

## 2011-12-11 NOTE — Assessment & Plan Note (Signed)
Improved with lasix.  Given rx for compression hose to use now that edema has improved. .  Recommended to keep legs elevated.

## 2011-12-11 NOTE — Assessment & Plan Note (Signed)
Still with painful cracked heels, will give rx for urea cream to help soften calluses.  If this is not helpful, will refer to podiatry.

## 2011-12-11 NOTE — Progress Notes (Signed)
  Subjective:    Patient ID: [name], male    DOB: May 03, 1966, 46 y.o.   MRN: [id number]  HPI  1.  Foot Pain:  Still with continued foot pain and burning.  Most prominent on heels.  Heels remain  Cracked.  Has been trying OTC antifungal cream.  Not applying moisturizer to feet that often.  2. Leg pain:  Leg pain and swelling improved with lasix.  Has been using steroid cream for stasis dermatitis which has helped a lot.  Not keeping elevated as instructed previously.   3. Impotence:  With increasing pain medications, especially gabapentin has noticed increasing problems with erectile dysfunction.  Would like to try a medication to help with this.  Denies any issues with low blood pressure, chest pain, lightheadedness, dizziness.   4.  Back pain:  Unchanged from previous, has been under the care of Dr. Ethelene Hal.  Still waiting to hear back from pain clinic.     Review of Systems Per HPI    Objective:   Physical Exam  Constitutional:       Obese male, disheveled, multiple tattoos   Cardiovascular: Normal rate and regular rhythm.   Pulmonary/Chest: Effort normal and breath sounds normal.  Musculoskeletal:       Trace edema, with venous stasis changes bilaterally.   Palpable pulses bilaterally Feet with thickened calluses bilaterally with cracked heels.  No bleeding or signs of infection.          Assessment & Plan:

## 2011-12-11 NOTE — Assessment & Plan Note (Signed)
Pain management referral, would like to go to [facility].

## 2011-12-11 NOTE — Assessment & Plan Note (Signed)
Discussed treatment options including cutting back on medications, would like to keep current medications and try cialis.  Informed that his insurance may not cover this.

## 2012-01-04 ENCOUNTER — Telehealth: Payer: Self-pay | Admitting: Family Medicine

## 2012-01-04 NOTE — Telephone Encounter (Signed)
[name] was unable to complete having his MRI.  He need provider to prescribe him something stronger to calm his nerves to get through the procedure.  If not he will have to be sedated in order to have this done.

## 2012-01-04 NOTE — Telephone Encounter (Signed)
I have not ordered an MRI on this patient.  He needs to talk to the physician who ordered this.

## 2012-01-07 NOTE — Telephone Encounter (Signed)
Dr. Ashley Royalty referred him to Pain Management and Pain Management has said that he needs to have an MRI which they cannot prescribe anything to calm his nerves.  He can't re-schedule the MRI without the Rx in hand.

## 2012-01-10 MED ORDER — DIAZEPAM 10 MG PO TABS: ORAL_TABLET | ORAL | Status: DC

## 2012-01-10 NOTE — Telephone Encounter (Signed)
Will give rx for valium to be taken 1 hour prior to procedure.  Placed in bin in front office for patient to pick up.

## 2012-01-12 ENCOUNTER — Ambulatory Visit (HOSPITAL_BASED_OUTPATIENT_CLINIC_OR_DEPARTMENT_OTHER): Payer: Medicare Other | Attending: Anesthesiology | Admitting: Radiology

## 2012-01-12 VITALS — Ht 69.0 in | Wt 280.0 lb

## 2012-01-12 DIAGNOSIS — G4733 Obstructive sleep apnea (adult) (pediatric): Secondary | ICD-10-CM | POA: Insufficient documentation

## 2012-01-12 DIAGNOSIS — E669 Obesity, unspecified: Secondary | ICD-10-CM

## 2012-01-12 DIAGNOSIS — G4730 Sleep apnea, unspecified: Secondary | ICD-10-CM

## 2012-01-18 ENCOUNTER — Emergency Department (HOSPITAL_COMMUNITY): Payer: Medicare Other

## 2012-01-18 ENCOUNTER — Encounter (HOSPITAL_COMMUNITY): Payer: Self-pay | Admitting: *Deleted

## 2012-01-18 ENCOUNTER — Emergency Department (HOSPITAL_COMMUNITY)
Admission: EM | Admit: 2012-01-18 | Discharge: 2012-01-18 | Disposition: A | Discharge status: Home / Self Care | Payer: Medicare Other | Attending: Emergency Medicine | Admitting: Emergency Medicine

## 2012-01-18 DIAGNOSIS — R05 Cough: Secondary | ICD-10-CM | POA: Insufficient documentation

## 2012-01-18 DIAGNOSIS — R059 Cough, unspecified: Secondary | ICD-10-CM | POA: Insufficient documentation

## 2012-01-18 DIAGNOSIS — R142 Eructation: Secondary | ICD-10-CM | POA: Insufficient documentation

## 2012-01-18 DIAGNOSIS — R7309 Other abnormal glucose: Secondary | ICD-10-CM | POA: Insufficient documentation

## 2012-01-18 DIAGNOSIS — R079 Chest pain, unspecified: Secondary | ICD-10-CM | POA: Insufficient documentation

## 2012-01-18 DIAGNOSIS — G4730 Sleep apnea, unspecified: Secondary | ICD-10-CM | POA: Insufficient documentation

## 2012-01-18 DIAGNOSIS — R609 Edema, unspecified: Secondary | ICD-10-CM | POA: Insufficient documentation

## 2012-01-18 DIAGNOSIS — R0781 Pleurodynia: Secondary | ICD-10-CM

## 2012-01-18 DIAGNOSIS — R739 Hyperglycemia, unspecified: Secondary | ICD-10-CM

## 2012-01-18 DIAGNOSIS — K5730 Diverticulosis of large intestine without perforation or abscess without bleeding: Secondary | ICD-10-CM | POA: Insufficient documentation

## 2012-01-18 DIAGNOSIS — R141 Gas pain: Secondary | ICD-10-CM | POA: Insufficient documentation

## 2012-01-18 DIAGNOSIS — R0989 Other specified symptoms and signs involving the circulatory and respiratory systems: Secondary | ICD-10-CM | POA: Insufficient documentation

## 2012-01-18 DIAGNOSIS — R1011 Right upper quadrant pain: Secondary | ICD-10-CM | POA: Insufficient documentation

## 2012-01-18 DIAGNOSIS — F17200 Nicotine dependence, unspecified, uncomplicated: Secondary | ICD-10-CM | POA: Insufficient documentation

## 2012-01-18 DIAGNOSIS — R0602 Shortness of breath: Secondary | ICD-10-CM | POA: Insufficient documentation

## 2012-01-18 DIAGNOSIS — R143 Flatulence: Secondary | ICD-10-CM | POA: Insufficient documentation

## 2012-01-18 LAB — CBC WITH DIFFERENTIAL/PLATELET
HCT: 50.1 % (ref 39.0–52.0)
Hemoglobin: 16.9 g/dL (ref 13.0–17.0)
Lymphocytes Relative: 23 % (ref 12–46)
Lymphs Abs: 2.2 10*3/uL (ref 0.7–4.0)
Monocytes Absolute: 0.4 10*3/uL (ref 0.1–1.0)
Monocytes Relative: 4 % (ref 3–12)
Neutro Abs: 7 10*3/uL (ref 1.7–7.7)
Neutrophils Relative %: 72 % (ref 43–77)
RBC: 5.36 MIL/uL (ref 4.22–5.81)
WBC: 9.8 10*3/uL (ref 4.0–10.5)

## 2012-01-18 LAB — POCT I-STAT, CHEM 8
BUN: 15 mg/dL (ref 6–23)
Calcium, Ion: 1.2 mmol/L (ref 1.12–1.23)
Chloride: 95 meq/L — ABNORMAL LOW (ref 96–112)
Creatinine, Ser: 1 mg/dL (ref 0.50–1.35)
Glucose, Bld: 334 mg/dL — ABNORMAL HIGH (ref 70–99)
HCT: 54 % — ABNORMAL HIGH (ref 39.0–52.0)
Hemoglobin: 18.4 g/dL — ABNORMAL HIGH (ref 13.0–17.0)
Potassium: 4.3 meq/L (ref 3.5–5.1)
Sodium: 137 meq/L (ref 135–145)
TCO2: 33 mmol/L (ref 0–100)

## 2012-01-18 MED ORDER — INSULIN ASPART 100 UNIT/ML ~~LOC~~ SOLN
8.0000 [IU] | Freq: Once | SUBCUTANEOUS | Status: AC
  Administered 2012-01-18: 8 [IU] via SUBCUTANEOUS
  Filled 2012-01-18: qty 8

## 2012-01-18 MED ORDER — INSULIN REGULAR HUMAN 100 UNIT/ML IJ SOLN: 8.0000 [IU] | Freq: Once | INTRAMUSCULAR | Status: DC

## 2012-01-18 MED ORDER — IOHEXOL 300 MG/ML  SOLN
125.0000 mL | Freq: Once | INTRAMUSCULAR | Status: AC | PRN
  Administered 2012-01-18: 125 mL via INTRAVENOUS

## 2012-01-18 NOTE — ED Notes (Signed)
Patient transported to CT 

## 2012-01-18 NOTE — ED Notes (Signed)
Pt from home with reports of left rib cage pain that started after coughing this morning at 0400, pt also endorses feeling a "pop" as well. Pt also reports worsening shortness of breath over the last month and endorses having a sleep study done 01/12/12 but has not received official results. Pt denies wearing home oxygen. Pt reports hx of breaking a rib with coughing.

## 2012-01-18 NOTE — ED Notes (Signed)
Pt maintaining O2 sat on NRB but continues to sleep, will monitor.

## 2012-01-18 NOTE — ED Notes (Signed)
Pt continues to have apneic periods while sleeping, NRB applied, will monitor.

## 2012-01-18 NOTE — ED Notes (Signed)
Pt also reports taking Norco PTA for pain with minimal relief.

## 2012-01-18 NOTE — ED Notes (Signed)
Patient transported to X-ray 

## 2012-01-18 NOTE — ED Provider Notes (Signed)
History     CSN: [id number]  Arrival date & time 01/18/12  0723   First MD Initiated Contact with Patient 01/18/12 0725      No chief complaint on file.   (Consider location/radiation/quality/duration/timing/severity/associated sxs/prior treatment) HPI Comments: [name] is a 46 y.o. Male who presents with complaint of left rib pain. Pt states he had a "coughing attack" this morning and felt like something "popped" on left side. Reports pain to left ribs, worsened with palpation and deep breathing. Pt states cough is chronic. Denies fever, chills, increased cough. Nothing makes pain better. States also some pain in his abdomen. States "something is not right, i think i broke my ribs." Pt has hx of rib fracture from coughing in the past.    Past Medical History  Diagnosis Date  . Neuropathy     Past Surgical History  Procedure Date  . Back surgery     No family history on file.  History  Substance Use Topics  . Smoking status: Current Everyday Smoker -- 1.0 packs/day for 32 years    Types: Cigarettes  . Smokeless tobacco: Not on file  . Alcohol Use: No      Review of Systems  Constitutional: Negative for fever and chills.  HENT: Negative for neck pain and neck stiffness.   Respiratory: Positive for cough and shortness of breath. Negative for chest tightness and wheezing.   Cardiovascular: Positive for chest pain and leg swelling. Negative for palpitations.  Gastrointestinal: Positive for abdominal pain and abdominal distention. Negative for nausea and vomiting.  Genitourinary: Negative for flank pain.  Musculoskeletal: Negative for back pain.  Skin: Negative.   Neurological: Negative for dizziness, weakness, light-headedness and headaches.    Allergies  Penicillins  Home Medications   Current Outpatient Rx  Name Route Sig Dispense Refill  . ALBUTEROL SULFATE HFA 108 (90 BASE) MCG/ACT IN AERS Inhalation Inhale 2 puffs into the lungs every 6 (six) hours as  needed for wheezing. 1 Inhaler 0  . DIAZEPAM 10 MG PO TABS  To take one hour prior to MRI 1 tablet 0  . FUROSEMIDE 20 MG PO TABS Oral Take 1 tablet (20 mg total) by mouth daily. 30 tablet 0  . GABAPENTIN 600 MG PO TABS Oral Take 1 tablet (600 mg total) by mouth 3 (three) times daily. 90 tablet 3  . HYDROCODONE-ACETAMINOPHEN 10-325 MG PO TABS Oral Take 1 tablet by mouth every 6 (six) hours as needed.    . MULTIVITAMINS PO  NO INSTRUCTIONS GIVEN     . NAPROXEN 500 MG PO TABS Oral Take 1 tablet (500 mg total) by mouth 2 (two) times daily with a meal. 30 tablet 0  . FISH OIL 1000 MG PO CAPS  One by mouth three times a day     . TADALAFIL 20 MG PO TABS Oral Take 0.5 tablets (10 mg total) by mouth every other day as needed for erectile dysfunction. 5 tablet 3  . TRIAMCINOLONE ACETONIDE 0.1 % EX CREA Topical Apply topically 2 (two) times daily. Apply to lower legs 45 g 0  . UREA 40 % EX CREA  Apply to heels daily. 1 each 2    BP 125/88  Pulse 107  Temp 98.4 F (36.9 C) (Oral)  Resp 16  SpO2 94%  Physical Exam  Nursing note and vitals reviewed. Constitutional: He is oriented to person, place, and time. He appears well-developed and well-nourished.       obese  HENT:  Head:  Normocephalic.  Eyes: Conjunctivae are normal.  Neck: Neck supple.  Cardiovascular: Normal rate, regular rhythm and normal heart sounds.   Pulmonary/Chest: No respiratory distress. He has rales. He exhibits tenderness.       Left lower ribs tender to palpation. No crepitus. No obvious bruising or deformity. crackles at bilateral bases  Abdominal: Soft. Bowel sounds are normal.       Abdomen distended. Tender diffusely  Musculoskeletal: He exhibits edema.  Neurological: He is alert and oriented to person, place, and time.  Skin: Skin is warm and dry.  Psychiatric: He has a normal mood and affect.    ED Course  Procedures (including critical care time)  Pt with left lower rib pain, abdominal pain after coughing  attack this am. Will check labs, CXR, CT abdomen to r/o intraabdominal trauma.   Results for orders placed during the hospital encounter of 01/18/12  CBC WITH DIFFERENTIAL      Component Value Range   WBC 9.8  4.0 - 10.5 K/uL   RBC 5.36  4.22 - 5.81 MIL/uL   Hemoglobin 16.9  13.0 - 17.0 g/dL   HCT 16.1  09.6 - 04.5 %   MCV 93.5  78.0 - 100.0 fL   MCH 31.5  26.0 - 34.0 pg   MCHC 33.7  30.0 - 36.0 g/dL   RDW 40.9  81.1 - 91.4 %   Platelets 175  150 - 400 K/uL   Neutrophils Relative 72  43 - 77 %   Neutro Abs 7.0  1.7 - 7.7 K/uL   Lymphocytes Relative 23  12 - 46 %   Lymphs Abs 2.2  0.7 - 4.0 K/uL   Monocytes Relative 4  3 - 12 %   Monocytes Absolute 0.4  0.1 - 1.0 K/uL   Eosinophils Relative 1  0 - 5 %   Eosinophils Absolute 0.1  0.0 - 0.7 K/uL   Basophils Relative 0  0 - 1 %   Basophils Absolute 0.0  0.0 - 0.1 K/uL  POCT I-STAT, CHEM 8      Component Value Range   Sodium 137  135 - 145 mEq/L   Potassium 4.3  3.5 - 5.1 mEq/L   Chloride 95 (*) 96 - 112 mEq/L   BUN 15  6 - 23 mg/dL   Creatinine, Ser 7.82  0.50 - 1.35 mg/dL   Glucose, Bld 956 (*) 70 - 99 mg/dL   Calcium, Ion 2.13  1.12 - 1.23 mmol/L   TCO2 33  0 - 100 mmol/L   Hemoglobin 18.4 (*) 13.0 - 17.0 g/dL   HCT 08.6 (*) 57.8 - 46.9 %  GLUCOSE, CAPILLARY      Component Value Range   Glucose-Capillary 230 (*) 70 - 99 mg/dL   Dg Ribs Unilateral W/chest Left  01/18/2012  *RADIOLOGY REPORT*  Clinical Data: Coughing, left anterolateral rib pain smoking history  LEFT RIBS AND CHEST - 3+ VIEW  Comparison: Chest x-ray of 10/31/2008  Findings: The lungs are clear.  No pneumothorax is seen.  The heart is within upper limits of normal.  Left rib detail films show no acute left rib fracture.  IMPRESSION: 1.  No active lung disease.  2.  Negative left rib detail.  Original Report Authenticated By: Juline Patch, M.D.   Dg Abd 1 View  01/18/2012  *RADIOLOGY REPORT*  Clinical Data: Abdominal pain.  ABDOMEN - 1 VIEW  Comparison:  07/03/2004  Findings: There is a nonobstructive bowel gas pattern.  No supine  evidence of free air.  No organomegaly or suspicious calcification.  No acute bony abnormality.  IMPRESSION: No acute findings.  Original Report Authenticated By: Cyndie Chime, M.D.   Ct Abdomen Pelvis W Contrast  01/18/2012  *RADIOLOGY REPORT*  Clinical Data: Shortness of breath, severe left upper quadrant abdominal pain  CT ABDOMEN AND PELVIS WITH CONTRAST  Technique:  Multidetector CT imaging of the abdomen and pelvis was performed following the standard protocol during bolus administration of intravenous contrast.  Contrast: OMNIPAQUE IOHEXOL 300 MG/ML  SOLN  Comparison: Abdominal radiograph - earlier same day; left rib radiographic series - earlier same day  Findings:  Normal hepatic contour.  There is mild diffuse decreased attenuation of the hepatic parenchyma suggestive of hepatic steatosis on this postcontrast examination.  No discrete hepatic lesions.  Gallbladder is under distended but otherwise normal.  No ascites.  There is symmetric enhancement and excretion of the bilateral kidneys.  No renal stones, renal lesions or evidence of urinary obstruction.  Normal appearance of the bilateral adrenal glands, pancreas and spleen.  Scattered colonic diverticulosis without evidence of diverticulitis.  The bowel otherwise normal in course and caliber without wall thickening or evidence of obstruction.  The appendix is not visualized compatible with provided surgical history.  No pneumoperitoneum, pneumatosis or portal venous gas.  Scattered minimal atherosclerotic calcifications within a normal caliber abdominal aorta.  The major branch vessels of the abdominal aorta are patent. Incidental note is made of a diminutive left- sided circumaortic renal vein.  There are scattered shoddy retroperitoneal lymph nodes not enlarged by CT criteria with index left-sided periaortic node measuring 8 mm in short axis diameter (image 42,  series 2).  No definite mesenteric adenopathy.  There is a solitary enlarged left external iliac lymph node measuring 1.1 cm in short axis diameter (image 78).  Additional shoddy pelvic and bilateral inguinal lymph nodes are not enlarged by CT criteria.  Limited visualization of the lower thorax demonstrates dependent ground-glass atelectasis.  No pleural effusion or pneumothorax. Cardiomegaly.  Small amount of pericardial fluid, presumably physiologic.  No acute or aggressive osseous abnormalities, specifically, no displaced right-sided rib fractures.  Degenerate change of the lower lumbar spine, worse at L4 - L5. Small left-sided indirect mesenteric fat containing inguinal hernia.  IMPRESSION: 1.  No explanation for patient's left upper quadrant abdominal pain.  Specifically, no evidence of enteric or urinary obstruction. 2.  Colonic diverticulosis without evidence of diverticulitis. 3.  Shoddy retroperitoneal, pelvic and bilateral inguinal lymph nodes are presumably reactive in etiology. 4.  Cardiomegaly with small amount of pericardial fluid, presumably physiologic.  Further evaluation with cardiac echo may be performed as clinically indicated.  Original Report Authenticated By: Waynard Reeds, M.D.    Negative CXR, abd xray, CT abd pelvis. Pt noted to have severe sleep apnea in ED while on the monitor. He would drop into 70s on oxygen saturation when sleeping, when awake oxygen in 90s. I spoke with family medicine, pts PCP group. Pt recently had a sleep study, will expedite tonsillectomy and give a c pap machine. Pt stable for d/c  Filed Vitals:   01/18/12 1226  BP: 141/93  Pulse: 106  Temp: 98.4 F (36.9 C)  Resp: 27     1. Cough   2. Sleep apnea   3. Rib pain on left side   4. Hyperglycemia       MDM          Lottie Mussel, PA 01/18/12 1615

## 2012-01-18 NOTE — ED Notes (Signed)
Pt

## 2012-01-18 NOTE — ED Notes (Signed)
PA at bedside.

## 2012-01-18 NOTE — ED Notes (Signed)
Placed red socks and fall risk bracelet on pt.  

## 2012-01-20 NOTE — ED Provider Notes (Signed)
Medical screening examination/treatment/procedure(s) were performed by non-physician practitioner and as supervising physician I was immediately available for consultation/collaboration.  Toy Baker, MD 01/20/12 [phone]

## 2012-01-22 DIAGNOSIS — R0609 Other forms of dyspnea: Secondary | ICD-10-CM

## 2012-01-22 DIAGNOSIS — R0989 Other specified symptoms and signs involving the circulatory and respiratory systems: Secondary | ICD-10-CM

## 2012-01-22 DIAGNOSIS — G4733 Obstructive sleep apnea (adult) (pediatric): Secondary | ICD-10-CM

## 2012-01-23 NOTE — Procedures (Signed)
[name], [name]               ACCOUNT NO.:  [id number]  MEDICAL RECORD NO.:  [id number]          PATIENT TYPE:  OUT  LOCATION:  SLEEP CENTER                 FACILITY:  [facility]  PHYSICIAN:  Joesph Marcy D. Maple Hudson, MD, FCCP, FACPDATE OF BIRTH:  07/10/1967  DATE OF STUDY:  01/12/2012                           NOCTURNAL POLYSOMNOGRAM  REFERRING PHYSICIAN:  KWADWO GYARTENG-DAKWA  REFERRING PHYSICIAN:  Tonye Royalty, MD.  INDICATION FOR STUDY:  Hypersomnia with sleep apnea.  EPWORTH SLEEPINESS SCORE:  22/24.  BMI 41.3, weight 280 pounds, height 69 inches, neck 20 inches.  MEDICATIONS:  Home medications are charted and reviewed.  SLEEP ARCHITECTURE:  Total sleep time 325 minutes with sleep efficiency 76.6%.  Stage I was 14%, stage II 80%, stage III absent. REM 6% of total sleep time.  Sleep latency 34.5 minutes. REM latency 153.5 minutes. Awake after sleep onset 65 minutes.  Arousal index 129.2.  BEDTIME MEDICATION:  None.  Sleep was marked by frequent brief wakings throughout the night.  RESPIRATORY DATA:  Apnea-hypopnea index (AHI) 132 per hour.  A total of 715 events were scored, including 581 obstructive apneas, 1 central apnea, 130 mixed apneas, and 3 hypopneas.  Events were not positional. REM AHI 92.3 per hour.  This was a diagnostic NPSG protocol as ordered, and CPAP titration was not done.  OXYGEN DATA:  Moderately loud snoring with oxygen desaturation to a nadir of 58% and mean oxygen saturation through the study of 79.6% on room air.  CARDIAC DATA:  Sinus rhythm/sinus tachycardia with occasional PVC.  Mean heart rate 96.5 per hour.  MOVEMENT/PARASOMNIA:  No significant movement disturbance. Bathroom x4. Somniloquy/talking in sleep.  IMPRESSION/RECOMMENDATION: 1. Very severe obstructive sleep apnea/hypopnea syndrome, AHI 132 per     hour with non-positional events.  Moderately loud snoring with     oxygen desaturation to a nadir of 58% and mean oxygen  saturation     through the study of 79.6% on room air.  320 minutes of total     recording time were recorded with room air oxygen saturation less     than 88%.  Oxygen saturation on room air while awake was 91%. 2. This is a diagnostic NPSG protocol as ordered.  Consider early     return for dedicated CPAP titration study or evaluate for     alternative management as clinically indicated.     Makita Blow D. Maple Hudson, MD, [facility], FACP Diplomate, American Board of Sleep Medicine    CDY/MEDQ  D:  01/22/2012 09:14:40  T:  01/23/2012 01:40:47  Job:  [id number]

## 2012-01-31 ENCOUNTER — Other Ambulatory Visit: Payer: Self-pay

## 2012-01-31 ENCOUNTER — Inpatient Hospital Stay (HOSPITAL_COMMUNITY): Admission: AD | Admit: 2012-01-31 | Payer: Medicare Other | Source: Ambulatory Visit | Admitting: Family Medicine

## 2012-01-31 ENCOUNTER — Emergency Department (HOSPITAL_COMMUNITY): Payer: Medicare Other

## 2012-01-31 ENCOUNTER — Inpatient Hospital Stay (HOSPITAL_COMMUNITY)
Admission: EM | Admit: 2012-01-31 | Discharge: 2012-02-03 | DRG: 189 | Disposition: A | Discharge status: Home / Self Care | Payer: Medicare Other | Attending: Family Medicine | Admitting: Family Medicine

## 2012-01-31 ENCOUNTER — Encounter (HOSPITAL_COMMUNITY): Payer: Self-pay | Admitting: Emergency Medicine

## 2012-01-31 ENCOUNTER — Encounter: Payer: Self-pay | Admitting: Family Medicine

## 2012-01-31 ENCOUNTER — Ambulatory Visit (INDEPENDENT_AMBULATORY_CARE_PROVIDER_SITE_OTHER): Payer: Medicare Other | Admitting: Family Medicine

## 2012-01-31 ENCOUNTER — Emergency Department (HOSPITAL_COMMUNITY)
Admission: EM | Admit: 2012-01-31 | Discharge: 2012-01-31 | Disposition: A | Discharge status: Home / Self Care | Payer: Medicare Other | Source: Home / Self Care | Attending: Emergency Medicine | Admitting: Emergency Medicine

## 2012-01-31 VITALS — BP 132/81 | HR 116 | Temp 98.8°F

## 2012-01-31 DIAGNOSIS — R7309 Other abnormal glucose: Secondary | ICD-10-CM | POA: Insufficient documentation

## 2012-01-31 DIAGNOSIS — R209 Unspecified disturbances of skin sensation: Secondary | ICD-10-CM | POA: Insufficient documentation

## 2012-01-31 DIAGNOSIS — E872 Acidosis, unspecified: Secondary | ICD-10-CM | POA: Diagnosis present

## 2012-01-31 DIAGNOSIS — L02219 Cutaneous abscess of trunk, unspecified: Secondary | ICD-10-CM | POA: Diagnosis not present

## 2012-01-31 DIAGNOSIS — R4182 Altered mental status, unspecified: Secondary | ICD-10-CM

## 2012-01-31 DIAGNOSIS — J95821 Acute postprocedural respiratory failure: Secondary | ICD-10-CM

## 2012-01-31 DIAGNOSIS — G589 Mononeuropathy, unspecified: Secondary | ICD-10-CM | POA: Diagnosis present

## 2012-01-31 DIAGNOSIS — R0689 Other abnormalities of breathing: Secondary | ICD-10-CM | POA: Diagnosis present

## 2012-01-31 DIAGNOSIS — G4733 Obstructive sleep apnea (adult) (pediatric): Secondary | ICD-10-CM

## 2012-01-31 DIAGNOSIS — IMO0002 Reserved for concepts with insufficient information to code with codable children: Secondary | ICD-10-CM

## 2012-01-31 DIAGNOSIS — E669 Obesity, unspecified: Secondary | ICD-10-CM | POA: Diagnosis present

## 2012-01-31 DIAGNOSIS — Z6841 Body Mass Index (BMI) 40.0 and over, adult: Secondary | ICD-10-CM

## 2012-01-31 DIAGNOSIS — G4730 Sleep apnea, unspecified: Secondary | ICD-10-CM

## 2012-01-31 DIAGNOSIS — I872 Venous insufficiency (chronic) (peripheral): Secondary | ICD-10-CM | POA: Diagnosis present

## 2012-01-31 DIAGNOSIS — E1149 Type 2 diabetes mellitus with other diabetic neurological complication: Secondary | ICD-10-CM

## 2012-01-31 DIAGNOSIS — F17200 Nicotine dependence, unspecified, uncomplicated: Secondary | ICD-10-CM | POA: Diagnosis present

## 2012-01-31 DIAGNOSIS — R739 Hyperglycemia, unspecified: Secondary | ICD-10-CM

## 2012-01-31 DIAGNOSIS — R7301 Impaired fasting glucose: Secondary | ICD-10-CM

## 2012-01-31 DIAGNOSIS — E119 Type 2 diabetes mellitus without complications: Secondary | ICD-10-CM

## 2012-01-31 DIAGNOSIS — G8929 Other chronic pain: Secondary | ICD-10-CM

## 2012-01-31 DIAGNOSIS — J9692 Respiratory failure, unspecified with hypercapnia: Secondary | ICD-10-CM

## 2012-01-31 DIAGNOSIS — I10 Essential (primary) hypertension: Secondary | ICD-10-CM | POA: Diagnosis present

## 2012-01-31 DIAGNOSIS — J9620 Acute and chronic respiratory failure, unspecified whether with hypoxia or hypercapnia: Principal | ICD-10-CM | POA: Diagnosis present

## 2012-01-31 DIAGNOSIS — M549 Dorsalgia, unspecified: Secondary | ICD-10-CM

## 2012-01-31 DIAGNOSIS — R0602 Shortness of breath: Secondary | ICD-10-CM | POA: Insufficient documentation

## 2012-01-31 DIAGNOSIS — G4734 Idiopathic sleep related nonobstructive alveolar hypoventilation: Secondary | ICD-10-CM

## 2012-01-31 DIAGNOSIS — E662 Morbid (severe) obesity with alveolar hypoventilation: Secondary | ICD-10-CM | POA: Diagnosis present

## 2012-01-31 DIAGNOSIS — J9600 Acute respiratory failure, unspecified whether with hypoxia or hypercapnia: Secondary | ICD-10-CM

## 2012-01-31 DIAGNOSIS — G894 Chronic pain syndrome: Secondary | ICD-10-CM | POA: Diagnosis present

## 2012-01-31 DIAGNOSIS — Z79899 Other long term (current) drug therapy: Secondary | ICD-10-CM

## 2012-01-31 DIAGNOSIS — R609 Edema, unspecified: Secondary | ICD-10-CM

## 2012-01-31 LAB — POCT I-STAT 3, ART BLOOD GAS (G3+)
Bicarbonate: 39.4 mEq/L — ABNORMAL HIGH (ref 20.0–24.0)
Bicarbonate: 39.3 mEq/L — ABNORMAL HIGH (ref 20.0–24.0)
O2 Saturation: 97 %
Patient temperature: 98
TCO2: 42 mmol/L (ref 0–100)
TCO2: 42 mmol/L (ref 0–100)
pCO2 arterial: 93.8 mmHg (ref 35.0–45.0)
pCO2 arterial: 80.3 mmHg (ref 35.0–45.0)
pH, Arterial: 7.296 — ABNORMAL LOW (ref 7.350–7.450)
pO2, Arterial: 117 mmHg — ABNORMAL HIGH (ref 80.0–100.0)
pO2, Arterial: 149 mmHg — ABNORMAL HIGH (ref 80.0–100.0)

## 2012-01-31 LAB — CBC
HCT: 53.3 % — ABNORMAL HIGH (ref 39.0–52.0)
Hemoglobin: 17.5 g/dL — ABNORMAL HIGH (ref 13.0–17.0)
Hemoglobin: 16.6 g/dL (ref 13.0–17.0)
MCH: 32.3 pg (ref 26.0–34.0)
MCH: 31.8 pg (ref 26.0–34.0)
MCHC: 32.8 g/dL (ref 30.0–36.0)
MCV: 99.2 fL (ref 78.0–100.0)
RBC: 5.22 MIL/uL (ref 4.22–5.81)

## 2012-01-31 LAB — CBC WITH DIFFERENTIAL/PLATELET
Hemoglobin: 16.8 g/dL (ref 13.0–17.0)
Lymphocytes Relative: 18 % (ref 12–46)
Lymphs Abs: 2.1 10*3/uL (ref 0.7–4.0)
Monocytes Relative: 5 % (ref 3–12)
Neutro Abs: 8.9 10*3/uL — ABNORMAL HIGH (ref 1.7–7.7)
Neutrophils Relative %: 76 % (ref 43–77)
RBC: 5.13 MIL/uL (ref 4.22–5.81)

## 2012-01-31 LAB — COMPREHENSIVE METABOLIC PANEL
ALT: 67 U/L — ABNORMAL HIGH (ref 0–53)
AST: 42 U/L — ABNORMAL HIGH (ref 0–37)
Alkaline Phosphatase: 84 U/L (ref 39–117)
Calcium: 9 mg/dL (ref 8.4–10.5)
Potassium: 4.8 mEq/L (ref 3.5–5.1)
Sodium: 137 mEq/L (ref 135–145)
Total Protein: 6.9 g/dL (ref 6.0–8.3)

## 2012-01-31 LAB — BASIC METABOLIC PANEL
BUN: 14 mg/dL (ref 6–23)
Calcium: 8.9 mg/dL (ref 8.4–10.5)
GFR calc non Af Amer: >90 mL/min (ref >90)
Glucose, Bld: 276 mg/dL — ABNORMAL HIGH (ref 70–99)
Potassium: 4.9 mEq/L (ref 3.5–5.1)

## 2012-01-31 LAB — GLUCOSE, CAPILLARY
Glucose-Capillary: 208 mg/dL — ABNORMAL HIGH (ref 70–99)
Glucose-Capillary: 178 mg/dL — ABNORMAL HIGH (ref 70–99)

## 2012-01-31 MED ORDER — HEPARIN SODIUM (PORCINE) 5000 UNIT/ML IJ SOLN
5000.0000 [IU] | Freq: Three times a day (TID) | INTRAMUSCULAR | Status: DC
  Administered 2012-01-31 – 2012-02-03 (×8): 5000 [IU] via SUBCUTANEOUS
  Filled 2012-01-31 (×13): qty 1

## 2012-01-31 MED ORDER — INSULIN ASPART 100 UNIT/ML ~~LOC~~ SOLN
0.0000 [IU] | Freq: Three times a day (TID) | SUBCUTANEOUS | Status: DC
  Administered 2012-01-31: 5 [IU] via SUBCUTANEOUS
  Administered 2012-02-01: 8 [IU] via SUBCUTANEOUS
  Administered 2012-02-01 – 2012-02-03 (×5): 5 [IU] via SUBCUTANEOUS

## 2012-01-31 MED ORDER — KETOROLAC TROMETHAMINE 30 MG/ML IJ SOLN
30.0000 mg | Freq: Four times a day (QID) | INTRAMUSCULAR | Status: DC | PRN
  Administered 2012-01-31 – 2012-02-03 (×6): 30 mg via INTRAVENOUS
  Filled 2012-01-31 (×7): qty 1

## 2012-01-31 MED ORDER — MORPHINE SULFATE 2 MG/ML IJ SOLN: 2.0000 mg | INTRAMUSCULAR | Status: DC | PRN

## 2012-01-31 MED ORDER — SODIUM CHLORIDE 0.9 % IJ SOLN
3.0000 mL | Freq: Two times a day (BID) | INTRAMUSCULAR | Status: DC
  Administered 2012-01-31 – 2012-02-02 (×4): 3 mL via INTRAVENOUS

## 2012-01-31 MED ORDER — ALBUTEROL SULFATE (5 MG/ML) 0.5% IN NEBU: 2.5000 mg | INHALATION_SOLUTION | RESPIRATORY_TRACT | Status: DC | PRN

## 2012-01-31 MED ORDER — INSULIN ASPART 100 UNIT/ML ~~LOC~~ SOLN
6.0000 [IU] | Freq: Once | SUBCUTANEOUS | Status: AC
  Administered 2012-01-31: 6 [IU] via INTRAVENOUS
  Filled 2012-01-31: qty 1

## 2012-01-31 MED ORDER — SODIUM CHLORIDE 0.9 % IV BOLUS (SEPSIS)
1000.0000 mL | Freq: Once | INTRAVENOUS | Status: AC
  Administered 2012-01-31: 1000 mL via INTRAVENOUS

## 2012-01-31 MED ORDER — HYDROCODONE-ACETAMINOPHEN 10-325 MG PO TABS
1.0000 | ORAL_TABLET | Freq: Four times a day (QID) | ORAL | Status: DC | PRN
  Administered 2012-01-31 – 2012-02-01 (×2): 2 via ORAL
  Filled 2012-01-31 (×2): qty 2

## 2012-01-31 MED ORDER — SODIUM CHLORIDE 0.9 % IV SOLN
INTRAVENOUS | Status: DC
  Administered 2012-01-31: 75 mL/h via INTRAVENOUS
  Administered 2012-01-31: 10 mL/h via INTRAVENOUS

## 2012-01-31 MED ORDER — ONDANSETRON HCL 4 MG/2ML IJ SOLN: 4.0000 mg | Freq: Four times a day (QID) | INTRAMUSCULAR | Status: DC | PRN

## 2012-01-31 MED ORDER — HYDROMORPHONE HCL PF 1 MG/ML IJ SOLN
1.0000 mg | Freq: Once | INTRAMUSCULAR | Status: AC
  Administered 2012-01-31: 1 mg via INTRAVENOUS
  Filled 2012-01-31: qty 1

## 2012-01-31 MED ORDER — ONDANSETRON HCL 4 MG PO TABS: 4.0000 mg | ORAL_TABLET | Freq: Four times a day (QID) | ORAL | Status: DC | PRN

## 2012-01-31 MED ORDER — ONDANSETRON HCL 4 MG/2ML IJ SOLN
4.0000 mg | Freq: Once | INTRAMUSCULAR | Status: AC
Start: 2012-01-31 — End: 2012-01-31
  Administered 2012-01-31: 4 mg via INTRAVENOUS
  Filled 2012-01-31: qty 2

## 2012-01-31 MED ORDER — MORPHINE SULFATE 4 MG/ML IJ SOLN
8.0000 mg | Freq: Once | INTRAMUSCULAR | Status: AC
  Administered 2012-01-31: 8 mg via INTRAVENOUS
  Filled 2012-01-31: qty 2

## 2012-01-31 MED ORDER — NICOTINE 21 MG/24HR TD PT24
21.0000 mg | MEDICATED_PATCH | Freq: Every day | TRANSDERMAL | Status: DC
  Administered 2012-01-31 – 2012-02-03 (×4): 21 mg via TRANSDERMAL
  Filled 2012-01-31 (×5): qty 1

## 2012-01-31 MED ORDER — FUROSEMIDE 10 MG/ML IJ SOLN
40.0000 mg | Freq: Once | INTRAMUSCULAR | Status: AC
Start: 2012-01-31 — End: 2012-01-31
  Administered 2012-01-31: 40 mg via INTRAVENOUS
  Filled 2012-01-31: qty 4

## 2012-01-31 NOTE — Progress Notes (Signed)
Patient was sleeping with bi pap when we went in to meet the patient. The patient became extremely agitated and ripped off his wires and tubing once he woke up. Pt would not calm down and walked into the bathroom with out his bipap. We did place him on 2 L [place]. Patient refused to be monitored with tele. Will continue to monitor.   Caia Lofaro, Charlaine Dalton RN

## 2012-01-31 NOTE — Progress Notes (Signed)
Apnea alarm continues to go off every few minutes or so. Pt complaining alarm is too loud, and to turn it off. RT readjusted mask, but explained Apnea alarm could not be silenced. Pt achieving Vt of only 213 and Mve of only 4. Rt will continue to monitor.

## 2012-01-31 NOTE — H&P (Signed)
FPTS ADMISSION HISTORY AND PHYSICAL  PCP: Everrett Coombe, DO  HPI [name] is an 46 y.o. male with a hx of obesity, hypertension, and diabetes who presents in hypercarbic respiratory failure from the family medicine clinic.  The pt presented initially to the [facility] ED just after midnight this morning with back pain.  CXR showed no focal consolidation and mild vascular congestion.  XR of lumbar spine showed no fracture. He was given morphine and then sent to follow-up later that morning with his PCP.  He was not given narcotics upon discharge as he has chronic pain and is followed at a pain clinic.    In clinic he was obtunded and hypoxic so he was sent to the [facility] ED.  In the ED the initial ABG was 7.2/93.8/117/39.4.  BNP was not elevated; WBC was 11.6, CO2 37, and glucose 306 with the rest of the lab values unremarkable.  Repeat CXR showed decreased lung volumes and increased diffuse interstitial prominence and possible acute interstitial edema.  EKG showed borderline R wave progression and sinus tachycardia but no ST elevation.  On exam in the ED, pt was on Bipap and somnolent but oriented to place and able to remember being in clinic earlier and knew the date of his recent sleep study.  Interview was limited as the pt would fall asleep mid-sentence.  He has no chest pain or SOB, he remembers being "sleepy" like this a year ago.  He says his abdomen is not more distended than normal.  He remembers getting the sleep study done at the end of July.  He smokes 2 ppd and rarely drinks alcohol.  His only pain is in his back.  Review of Systems  Constitutional: Negative for fever.  Eyes: Negative for blurred vision and double vision.  Respiratory: Negative for cough and shortness of breath.   Cardiovascular: Negative for chest pain.  Gastrointestinal: Negative for nausea, vomiting and abdominal pain.  Musculoskeletal: Positive for back pain.  Neurological: Negative for speech change and  headaches.  Psychiatric/Behavioral: Negative for memory loss.   PE:  Blood pressure 144/75, pulse 100, temperature 98.3 F (36.8 C), temperature source Oral, resp. rate 17, weight 290 lb (131.543 kg), SpO2 98.00%. Physical Exam  Constitutional: He is oriented to person, place, and time. He appears well-developed. He is cooperative. He is easily aroused. No distress. Face mask in place.       Somnolent male  HENT:  Head: Normocephalic and atraumatic.  Cardiovascular: Regular rhythm, normal heart sounds and intact distal pulses.   Respiratory: He is in respiratory distress. He has wheezes.       tachycardic  GI: Bowel sounds are normal. He exhibits distension. There is no tenderness.  Musculoskeletal: He exhibits no edema and no tenderness.  Lymphadenopathy:    He has no cervical adenopathy.  Neurological: He is alert, oriented to person, place, and time and easily aroused. No cranial nerve deficit.       Oriented to place and situation.  Memory intact.  Skin: Skin is warm and dry.       + chronic stasis changes bilateral lower extremities   Psychiatric: Thought content normal.   LE: lower extremities darkly erythematous with slight edema.   Assessment/Plan  1. Hypercarbic respiratory failure: ABG in ED was 7.2/93.8/117/39.4 -repeat ABG at 1900, 0600 on 8/20 to asses CO2 and pH, although pt's mental status will be best assessment  -Bipap, with respiratory's assistance for titrating; did have difficulty in ED  finding settings but pt appears comfortable and is able to sleep without alarms sounding at this time -continuous pulse ox and cardiac monitoring in step-down unit -pulm consult called, greatly appreciate their assistance in managing this pt, ?if patient will need a trach in the future as his labs show a likely acute on chronic hypercarbic respiratory failure  -repeat CBC, CMP in AM -albuterol prn  2.  Back pain -IV Toradol -avoid narcotics secondary to already hypercapnic  respiratory failure and multiple co-morbidities   3. Diabetes: no known diagnosis, although with elevated CBGs at this time -f/u A1C -s/p 6 u Novolog 8/19 at 1330 -start SSI  -hold neurontin while sleepy/sedated   4. HTN -monitor, is not on any home meds at this time  4. Tobacco abuse: smokes ~ 2 ppd -nicotine patch ( )  5. FEN/GI -NPO -75 ml/hr NS  6. Ppx -subcut heparin  7. Dispo -pending resolution of altered mental status 2/2 hypercarbia and plan to control sleep apnea   Georgina Pillion 01/31/2012, 2:52 PM   UPPER LEVEL ADDENDUM  I have seen and examined [name] with Georgina Pillion, MS4 and I agree with the above assessment/plan. I have reviewed all available data and have made any necessary changes to the above H&P.  [name], PGY-3 01/31/2012, 4:01 PM

## 2012-01-31 NOTE — Patient Instructions (Addendum)
Thank you for coming in today, it was good to see you I am concerned about how sleepy you are.  I understand you have not slept for the past three days due to your pain, but your sleep study also shows severe sleep apnea.  We need to schedule another sleep study and have them try you on a cpap machine.  Untreated sleep apnea can lead to many other dangerous problems.   I have given you a few days worth of pain medication as well as a steroid.  Please follow up with me in 1 week. I am also checking your A1c since your blood sugar was high in the ER

## 2012-01-31 NOTE — ED Notes (Signed)
Pt undressed, in gown, on monitor, continuous pulse oximetry, blood pressure cuff and oxygen [place] (3L); EKG performed 

## 2012-01-31 NOTE — ED Notes (Signed)
Pts monitor alarming sats 77%. Patient placed on NRB @ 15L with O2 sats 98%. Remi Haggard, NP at bedside with patient. Dr. Patria Mane aware.

## 2012-01-31 NOTE — ED Provider Notes (Signed)
Medical screening examination/treatment/procedure(s) were performed by non-physician practitioner and as supervising physician I was immediately available for consultation/collaboration.  Olivia Mackie, MD 01/31/12 [phone]

## 2012-01-31 NOTE — Care Management Note (Addendum)
  Page 2 of 2   02/02/2012     1:04:45 PM   CARE MANAGEMENT NOTE 02/02/2012  Patient:  [name], [name]   Account Number:  [id number]  Date Initiated:  01/31/2012  Documentation initiated by:  Junius Creamer  Subjective/Objective Assessment:   adm w resp distress     Action/Plan:   lives w wife, pcp dr Everrett Coombe   Anticipated DC Date:     Anticipated DC Plan:        DC Planning Services  CM consult  Follow-up appt scheduled      PAC Choice  DURABLE MEDICAL EQUIPMENT   Choice offered to / List presented to:  C-1 Patient   DME arranged  BIPAP     Wide rolling walker DME agency  [facility]        Status of service:   Medicare Important Message given?   (If response is "NO", the following Medicare IM given date fields will be blank) Date Medicare IM given:   Date Additional Medicare IM given:    Discharge Disposition:  HOME/SELF CARE  Per UR Regulation:  Reviewed for med. necessity/level of care/duration of stay  If discussed at Long Length of Stay Meetings, dates discussed:    Comments:  8/21 12:37p debbie Tovah Slavick rn,bsn have spoken w justin at [facility]. he referred me to [place] [facility] [phone] ext [phone] to try and get bipap arranged. have faxed over notes from [facility] about qual for bipap. also have sched sleep study for bipap titration w [facility] on 9-4 at 8pm. spoke w amanda at [facility] [phone] to get their office notes to [facility] also to help [facility] w paperwork to del this bipap.explained above to dr Pollie Meyer w [facility].have spoken w jenny at [facility] and pt not qual for bipap-wants to know if has copd, needs overnite puls ox study on o2 on prescribed liter flow. spoke w dr byrum and he asked about dx of chronic resp failure w baseline pco2 >55.have left message w jenny at [facility].  8/21 11:25a debbie Lylian Sanagustin rn,bsn spoke w pt. he had sleep study on 01-12-12. he states sleep lab had ordered bipap from [facility] but they had not gotten in touch w him.  have spoken w justin at [facility] and explained that we must get bipap arranged prior to this pt being disch. justin to ck on order and help get this arranged.  8/19 16:00 debbie dwell rn,bsn [phone]

## 2012-01-31 NOTE — Consult Note (Signed)
Name: [name] MRN: [id number] DOB: 1965-07-13    LOS: 0  [facility]   History of Present Illness: 46 y/o M with PMH of Morbid Obesity, OSA, left pneumothorax, and chronic back pain who was seen in ER on 8/18 with a 2 day hx of back pain and shortness of breath.  Followed up with PCP 8/19 and was noted to be hypoxic with saturations in 70's -80's on 2L.  Was transferred via EMS to ER on 8/19 - noted to drift off to sleep during conversation, placed on 50% O2 with improvement in saturations.  Noted recent fall with difficulty walking and radicular pain.  ABG eval demonstrated ph 7.23 / pCO2 93 / pO2 117 / HCO3 39, CXR with no acute infiltrate.  Placed on bipap, admitted per IM TS & PCCM consulted for decompensated OHS/OSA.     Lines / Drains:   Cultures:   Antibiotics:   Tests / Events: 01/12/12 Sleep Study>>>Very severe obstructive sleep apnea/hypopnea syndrome, AHI 132 per hour with non-positional events. Moderately loud snoring with oxygen desaturation to a nadir of 58% and mean oxygen saturation through the study of 79.6% on room air. 320 minutes of total recording time were recorded with room air oxygen saturation less  than 88%. Oxygen saturation on room air while awake was 91%. This is a diagnostic NPSG protocol as ordered. Consider early return for dedicated CPAP titration study or evaluate for alternative management as clinically indicated.    Past Medical History  Diagnosis Date  . Neuropathy   . Obesity   . Collapsed lung     Left side  . OSA (obstructive sleep apnea)   . Diabetes mellitus     Past Surgical History  Procedure Date  . Back surgery   . Appendectomy   . Eye surgery   . Fracture surgery     Prior to Admission medications   Medication Sig Start Date End Date Taking? Authorizing Provider  gabapentin (NEURONTIN) 600 MG tablet Take 1 tablet (600 mg total) by mouth 3 (three) times daily. 12/01/11  Yes Everrett Coombe, DO    HYDROcodone-acetaminophen (NORCO) 10-325 MG per tablet Take 2 tablets by mouth 4 (four) times daily.   Yes Historical Provider, MD    Allergies Allergies  Allergen Reactions  . Penicillins     REACTION: Anaphylaxis    Family History History reviewed. No pertinent family history.  Social History  reports that he has been smoking Cigarettes.  He has a 48 pack-year smoking history. He has never used smokeless tobacco. He reports that he drinks alcohol. He reports that he does not use illicit drugs.  Review Of Systems: unable to complete as pt is on bipap  Vital Signs: Temp:  [97.7 F (36.5 C)-98.8 F (37.1 C)] 98.2 F (36.8 C) (08/19 1542) Pulse Rate:  [91-117] 96  (08/19 1542) Resp:  [14-24] 18  (08/19 1542) BP: (112-152)/(68-94) 128/68 mmHg (08/19 1542) SpO2:  [61 %-100 %] 96 % (08/19 1542) FiO2 (%):  [50 %] 50 % (08/19 0551) Weight:  [290 lb (131.543 kg)] 290 lb (131.543 kg) (08/19 1131)    Physical Examination: General: morbidly obese on biapap Neuro: somnolent, arouses to name CV: s1s2 rrr PULM: resp's even/non-labored, lungs bilaterally diminished GI: abd obese / soft, bsx4 active Extremities: warm/dry   Ventilator settings / FiO2: Vent Mode:  [-]  FiO2 (%):  [50 %] 50 %  Labs    CBC  Lab 01/31/12 1220 01/31/12 0455  HGB 16.8 17.5*  HCT 50.6 53.3*  WBC 11.6* 11.8*  PLT 164 173     BMET  Lab 01/31/12 1220 01/31/12 0455  NA 137 133*  K 4.8 4.9  CL 96 93*  CO2 37* 35*  GLUCOSE 306* 276*  BUN 15 14  CREATININE 0.85 0.89  CALCIUM 9.0 8.9  MG -- --  PHOS -- --    Lab 01/31/12 1239  PHART 7.231*  PCO2ART 93.8*  PO2ART 117.0*  HCO3 39.4*  TCO2 42  O2SAT 97.0     Radiology: 8/19 CXR>>>Decreased lung volumes. Increased diffuse interstitial prominence; acute interstitial edema cannot be excluded.     Assessment and Plan: Principal Problem:  *Hypercarbia Active Problems:  OBESITY, NOS  TOBACCO DEPENDENCE  HYPERTENSION, BENIGN  SYSTEMIC  OSA (obstructive sleep apnea)  Altered mental status   Acute hypercarbic respiratory failure -  Assessment -in setting of decompensated OHS / OSA.  Recent fall with ED visit - unclear if narcotics were added to regimen in last few days with fall or if chronic but likely contributing to decompensation of OSA.  Sleep study 7/13 (see above results) with severe obstructive sleep apnea but patient reports no CPAP at home.  Current tobacco abuse.  CXR findings likely atx & difficult to penetrate due to size.    Plan:  -mandatory bipap now, increase to 16 / 8, will need mandatory nocturnal NIMV support -titrate oxygen to keep sats >90% -follow up ABG in 2 hours -NPO until mental status improves -one time dose lasix 40 mg now -smoking cessation   Canary Brim, NP-C [facility] Pgr: [phone]  01/31/2012, 4:04 PM    Attending Addendum:  I have seen the patient, discussed the issues, test results and plans with B. Veleta Miners, NP. I agree with the Assessment and Plans as ammended above.   Levy Pupa, MD, PhD 01/31/2012, 4:53 PM [facility] [phone] or if no answer [phone]

## 2012-01-31 NOTE — ED Provider Notes (Signed)
Medical screening examination/treatment/procedure(s) were conducted as a shared visit with non-physician practitioner(s) and myself.  I personally evaluated the patient during the encounter   Please see my other note  Lyanne Co, MD 01/31/12 1443

## 2012-01-31 NOTE — Assessment & Plan Note (Signed)
Recent dx of OSA, likely hypercarbia/hypoxia from OSA that was exacerbated by narcotic administration.  I feel that he needs to be admitted to hospital however since initial exam he has become more difficulty to arouse.  Will send to ED for further evaluation.

## 2012-01-31 NOTE — H&P (Signed)
Seen and examined Discussed with Drs Fara Boros and Ashley Royalty.  Agree with their management and documentation.  Briefly  46 yo male admitted from [facility] today with somnolence.  He has known morbid obesity and a markedly abnormal sleep study.  Found to be in hypercapneic respiratory failure.  Issues:  1. Acute on chronic respiratory failure.  My interpretation of the ABGs is that the acidosis is mild compared to the CO2 level.  My guess is that he has chronic moderate CO2 retention from obesity hypoventilation syndrome, which has acutely worsened with narcotics for his back pain.  2. Obesity hypoventilation syndrome.  We have gotten pulm involved.  He is verging on intubation and may need trach long term.   3. Chronic pain syndrome - back pain followed by pain clinic.  Unfortunately, pain management will be an issue.  He awakens from his CO2 narcosis with stimulation and the first thing he says is that he is in severe pain and wants treatment.  Must avoid all CNS/respiratory depressants at this point.   4. Tobacco abuse - I hope he takes advantage of this opportunity to quit.   5. DM

## 2012-01-31 NOTE — ED Provider Notes (Signed)
History     CSN: [id number]  Arrival date & time 01/31/12  1127   First MD Initiated Contact with Patient 01/31/12 1131      Chief Complaint  Patient presents with  . Respiratory Distress    (Consider location/radiation/quality/duration/timing/severity/associated sxs/prior treatment) Patient is a 46 y.o. male presenting with shortness of breath. The history is provided by the patient. No language interpreter was used.  Shortness of Breath  The current episode started today. The onset was gradual. The problem has been gradually worsening. The problem is severe. Nothing relieves the symptoms. Associated symptoms include shortness of breath. Pertinent negatives include no chest pain, no fever, no sore throat, no cough and no wheezing. He was not exposed to toxic fumes.   46 year old morbidly obese male coming in for the second time today complaint of shortness of breath. O2 sats are 79-80% on 2 L nasal cannula. Patient appears to be having long apneic spells. Patient was seen last night for severe back pain and with his PCP today to get the MRI order and PCP called EMS to have him transferred to the meds as can ER for shortness of breath. Patient has a past medical history of a collapsed lung. He denies cough or fever. When placed on 50% O2 mask is oxygen level came up to 99. Patient drifts off to sleep while you're talking to him. States that his last pain medication was given in the ER last night at 12:30. States that he has not slept in 2 days and that may be why he drifts off to sleep. He has chronic back pain and has prior back surgery. Coming from Dr. Ashley Royalty office. States that he is having difficulty ambulating now since he fell last Saturday and injured his back. States that he does have weakness in his lower extremities. Right lower back pain is radiating into his right thigh. Denies bowel or bladder problems or cauda equina symptoms. Also states that he had a recent sleep study on July 31  but has not heard the results. He has a prescription for Lasix but has not taken it to reduce his lower extremity edema. Patient does smoke but denies drugs or alcohol.  Past Medical History  Diagnosis Date  . Neuropathy   . Obesity   . Collapsed lung     Left side  . OSA (obstructive sleep apnea)   . Diabetes mellitus     Past Surgical History  Procedure Date  . Back surgery   . Appendectomy   . Eye surgery   . Fracture surgery     No family history on file.  History  Substance Use Topics  . Smoking status: Current Everyday Smoker -- 1.5 packs/day for 32 years    Types: Cigarettes  . Smokeless tobacco: Never Used  . Alcohol Use: Yes     once a month      Review of Systems  Constitutional: Negative.  Negative for fever.  HENT: Negative.  Negative for sore throat.   Eyes: Negative.   Respiratory: Positive for shortness of breath. Negative for cough and wheezing.   Cardiovascular: Negative.  Negative for chest pain.  Gastrointestinal: Negative.   Musculoskeletal: Positive for back pain and gait problem.  Neurological: Negative.   Psychiatric/Behavioral: Negative.   All other systems reviewed and are negative.    Allergies  Penicillins  Home Medications   Current Outpatient Rx  Name Route Sig Dispense Refill  . ALBUTEROL SULFATE HFA 108 (90 BASE) MCG/ACT IN AERS  Inhalation Inhale 2 puffs into the lungs every 6 (six) hours as needed for wheezing. 1 Inhaler 0  . FUROSEMIDE 20 MG PO TABS Oral Take 1 tablet (20 mg total) by mouth daily. 30 tablet 0  . GABAPENTIN 600 MG PO TABS Oral Take 1 tablet (600 mg total) by mouth 3 (three) times daily. 90 tablet 3  . HYDROCODONE-ACETAMINOPHEN 10-325 MG PO TABS Oral Take 1 tablet by mouth every 6 (six) hours as needed. For pain      BP 112/80  Pulse 102  Temp 98.3 F (36.8 C) (Oral)  Resp 24  Wt 290 lb (131.543 kg)  SpO2 92%  Physical Exam  Nursing note and vitals reviewed. Constitutional: He is oriented to person,  place, and time. He appears well-developed and well-nourished.       Morbid obese  HENT:  Head: Normocephalic.  Eyes: Conjunctivae and EOM are normal. Pupils are equal, round, and reactive to light.  Neck: Normal range of motion. Neck supple.  Cardiovascular: Normal rate.   Pulmonary/Chest: Effort normal. No respiratory distress. He exhibits no tenderness.  Abdominal: Soft. Bowel sounds are normal. He exhibits no distension. There is no tenderness.  Musculoskeletal: Normal range of motion. He exhibits edema and tenderness.       1+pitting edema to LE R lower back tenderness  Neurological: He is alert and oriented to person, place, and time.  Skin: Skin is warm and dry.  Psychiatric: He has a normal mood and affect.    ED Course  Procedures (including critical care time)  Patient was placed on Bipap for sleep apnea and low O2 sats.  Back pain chronic.  Will hold pain meds presently for patient having apnea.    150pm  Patient on Bipap but not getting the tidal volume when he goes to sleep.  O2 sats are 96 presently.   Having obstruction.  May need trach.     Labs Reviewed  CBC WITH DIFFERENTIAL  COMPREHENSIVE METABOLIC PANEL  BLOOD GAS, ARTERIAL  PRO B NATRIURETIC PEPTIDE   Dg Chest 2 View  01/31/2012  *RADIOLOGY REPORT*  Clinical Data: Shortness of breath and pain.  CHEST - 2 VIEW  Comparison: 01/18/2012  Findings: Cardiac enlargement with mild pulmonary vascular congestion.  No edema or consolidation.  No blunting of costophrenic angles.  No pneumothorax.  Emphysematous changes.  Old bilateral rib fractures.  No significant change since previous study.  IMPRESSION: Cardiac enlargement with mild pulmonary vascular congestion.  No focal consolidation or edema.  Original Report Authenticated By: Marlon Pel, M.D.   Dg Lumbar Spine Complete  01/31/2012  *RADIOLOGY REPORT*  Clinical Data: The back pain radiating down the right leg after fall two nights ago.  LUMBAR SPINE -  COMPLETE 4+ VIEW  Comparison: 07/03/2004  Findings: Five lumbar type vertebral bodies with partial sacralization of L5.  Normal alignment of the lumbar vertebrae and facet joints.  Degenerative narrowing of the L4-5 disc space. Degenerative changes in the lower lumbar facet joints.  Mild anterior wedging of T11 and T12, stable since previous study.  No acute compression fractures are identified.  No focal bone lesion or bone destruction.  Bone cortex and trabecular architecture appear intact.  IMPRESSION: No displaced fractures identified.  Original Report Authenticated By: Marlon Pel, M.D.     No diagnosis found.    MDM  46 yo morbidly obese male with SOB and sleep apnea with ? Obstruction on bi-pap.  He has chronic back pain and was seen  last night in the ER.  Patient has not slept for 2 days and has had severe pain since Saturday.  Was at PCP when they called ems for hypoxia.  O2 sats were 80 on 3lnc and 98% on 50% mask.  Plan [facility] family medicine will admit to stepdown for pain control and hypoxia.  Patient remains on Bi-pap. Chest x-ray unremarkable was seen by myself.    WBC 11.6. Co2 93.8.  Patient drift off to sleep while talking to him.  Liver enzymes elevated.  Pt denies etoh or drugs.  Smoker.          Remi Haggard, NP 01/31/12 1442

## 2012-01-31 NOTE — Progress Notes (Signed)
Pt. Refuses to wear BIPAP at this time. RT spent time with pt. Explaining the importance of wearing his BIPAP & normal ranges for CO2. Pt. Understands that his CO2 is still elevated but is requesting pain meds & states that he will leave. Pt. Was placed on 4L [place] & is stable at this time. BP: 134/99, HR: 87, RR: 24, SAT's: 93%. MD is aware of all of these events and is on the way to see pt.

## 2012-01-31 NOTE — ED Provider Notes (Signed)
Medical screening examination/treatment/procedure(s) were conducted as a shared visit with non-physician practitioner(s) and myself.  I personally evaluated the patient during the encounter   Date: 01/31/2012  Rate: 102  Rhythm: normal sinus rhythm  QRS Axis: normal  Intervals: normal  ST/T Wave abnormalities: normal  Conduction Disutrbances: none  Narrative Interpretation:   Old EKG Reviewed: No significant changes noted  Patient appears to be having severe sleep apnea symptoms secondary to decreased sleep as well as pain medicine given the hospital yesterday.  His back pain seems musculoskeletal and reproducible on exam.  No new lower extremity weakness to suggest need for emergent MRI.  The patient continues to have severe obstructive symptoms on BiPAP with multilevel and 60 cc until a long way consent.  I think the patient will be a candidate for tracheostomy for severe sleep apnea. Pulmonary consulation should be sought by primary admitting team. Will admit to [facility], his pcp. l spine films last night normal.   1. Respiratory failure with hypercapnia   2. Back pain   3. Chronic pain    Results for orders placed during the hospital encounter of 01/31/12  CBC WITH DIFFERENTIAL      Component Value Range   WBC 11.6 (*) 4.0 - 10.5 K/uL   RBC 5.13  4.22 - 5.81 MIL/uL   Hemoglobin 16.8  13.0 - 17.0 g/dL   HCT 16.1  09.6 - 04.5 %   MCV 98.6  78.0 - 100.0 fL   MCH 32.7  26.0 - 34.0 pg   MCHC 33.2  30.0 - 36.0 g/dL   RDW 40.9  81.1 - 91.4 %   Platelets 164  150 - 400 K/uL   Neutrophils Relative 76  43 - 77 %   Neutro Abs 8.9 (*) 1.7 - 7.7 K/uL   Lymphocytes Relative 18  12 - 46 %   Lymphs Abs 2.1  0.7 - 4.0 K/uL   Monocytes Relative 5  3 - 12 %   Monocytes Absolute 0.6  0.1 - 1.0 K/uL   Eosinophils Relative 0  0 - 5 %   Eosinophils Absolute 0.1  0.0 - 0.7 K/uL   Basophils Relative 0  0 - 1 %   Basophils Absolute 0.0  0.0 - 0.1 K/uL  COMPREHENSIVE METABOLIC PANEL      Component Value  Range   Sodium 137  135 - 145 mEq/L   Potassium 4.8  3.5 - 5.1 mEq/L   Chloride 96  96 - 112 mEq/L   CO2 37 (*) 19 - 32 mEq/L   Glucose, Bld 306 (*) 70 - 99 mg/dL   BUN 15  6 - 23 mg/dL   Creatinine, Ser 7.82  0.50 - 1.35 mg/dL   Calcium 9.0  8.4 - 95.6 mg/dL   Total Protein 6.9  6.0 - 8.3 g/dL   Albumin 3.4 (*) 3.5 - 5.2 g/dL   AST 42 (*) 0 - 37 U/L   ALT 67 (*) 0 - 53 U/L   Alkaline Phosphatase 84  39 - 117 U/L   Total Bilirubin 0.2 (*) 0.3 - 1.2 mg/dL   GFR calc non Af Amer >90  >90 mL/min   GFR calc Af Amer >90  >90 mL/min  PRO B NATRIURETIC PEPTIDE      Component Value Range   Pro B Natriuretic peptide (BNP) 121.8  0 - 125 pg/mL  POCT I-STAT 3, BLOOD GAS (G3+)      Component Value Range   pH, Arterial 7.231 (*)  7.350 - 7.450   pCO2 arterial 93.8 (*) 35.0 - 45.0 mmHg   pO2, Arterial 117.0 (*) 80.0 - 100.0 mmHg   Bicarbonate 39.4 (*) 20.0 - 24.0 mEq/L   TCO2 42  0 - 100 mmol/L   O2 Saturation 97.0     Acid-Base Excess 7.0 (*) 0.0 - 2.0 mmol/L   Collection site RADIAL, ALLEN'S TEST ACCEPTABLE     Drawn by VENIPUNCTURE     Sample type ARTERIAL     Comment NOTIFIED PHYSICIAN     Dg Chest 2 View  01/31/2012  *RADIOLOGY REPORT*  Clinical Data: Shortness of breath and pain.  CHEST - 2 VIEW  Comparison: 01/18/2012  Findings: Cardiac enlargement with mild pulmonary vascular congestion.  No edema or consolidation.  No blunting of costophrenic angles.  No pneumothorax.  Emphysematous changes.  Old bilateral rib fractures.  No significant change since previous study.  IMPRESSION: Cardiac enlargement with mild pulmonary vascular congestion.  No focal consolidation or edema.  Original Report Authenticated By: Marlon Pel, M.D.   Dg Ribs Unilateral W/chest Left  01/18/2012  *RADIOLOGY REPORT*  Clinical Data: Coughing, left anterolateral rib pain smoking history  LEFT RIBS AND CHEST - 3+ VIEW  Comparison: Chest x-ray of 10/31/2008  Findings: The lungs are clear.  No pneumothorax is  seen.  The heart is within upper limits of normal.  Left rib detail films show no acute left rib fracture.  IMPRESSION: 1.  No active lung disease.  2.  Negative left rib detail.  Original Report Authenticated By: Juline Patch, M.D.   Dg Lumbar Spine Complete  01/31/2012  *RADIOLOGY REPORT*  Clinical Data: The back pain radiating down the right leg after fall two nights ago.  LUMBAR SPINE - COMPLETE 4+ VIEW  Comparison: 07/03/2004  Findings: Five lumbar type vertebral bodies with partial sacralization of L5.  Normal alignment of the lumbar vertebrae and facet joints.  Degenerative narrowing of the L4-5 disc space. Degenerative changes in the lower lumbar facet joints.  Mild anterior wedging of T11 and T12, stable since previous study.  No acute compression fractures are identified.  No focal bone lesion or bone destruction.  Bone cortex and trabecular architecture appear intact.  IMPRESSION: No displaced fractures identified.  Original Report Authenticated By: Marlon Pel, M.D.   Dg Abd 1 View  01/18/2012  *RADIOLOGY REPORT*  Clinical Data: Abdominal pain.  ABDOMEN - 1 VIEW  Comparison: 07/03/2004  Findings: There is a nonobstructive bowel gas pattern.  No supine evidence of free air.  No organomegaly or suspicious calcification.  No acute bony abnormality.  IMPRESSION: No acute findings.  Original Report Authenticated By: Cyndie Chime, M.D.   Ct Abdomen Pelvis W Contrast  01/18/2012  *RADIOLOGY REPORT*  Clinical Data: Shortness of breath, severe left upper quadrant abdominal pain  CT ABDOMEN AND PELVIS WITH CONTRAST  Technique:  Multidetector CT imaging of the abdomen and pelvis was performed following the standard protocol during bolus administration of intravenous contrast.  Contrast: OMNIPAQUE IOHEXOL 300 MG/ML  SOLN  Comparison: Abdominal radiograph - earlier same day; left rib radiographic series - earlier same day  Findings:  Normal hepatic contour.  There is mild diffuse decreased  attenuation of the hepatic parenchyma suggestive of hepatic steatosis on this postcontrast examination.  No discrete hepatic lesions.  Gallbladder is under distended but otherwise normal.  No ascites.  There is symmetric enhancement and excretion of the bilateral kidneys.  No renal stones, renal lesions or evidence  of urinary obstruction.  Normal appearance of the bilateral adrenal glands, pancreas and spleen.  Scattered colonic diverticulosis without evidence of diverticulitis.  The bowel otherwise normal in course and caliber without wall thickening or evidence of obstruction.  The appendix is not visualized compatible with provided surgical history.  No pneumoperitoneum, pneumatosis or portal venous gas.  Scattered minimal atherosclerotic calcifications within a normal caliber abdominal aorta.  The major branch vessels of the abdominal aorta are patent. Incidental note is made of a diminutive left- sided circumaortic renal vein.  There are scattered shoddy retroperitoneal lymph nodes not enlarged by CT criteria with index left-sided periaortic node measuring 8 mm in short axis diameter (image 42, series 2).  No definite mesenteric adenopathy.  There is a solitary enlarged left external iliac lymph node measuring 1.1 cm in short axis diameter (image 78).  Additional shoddy pelvic and bilateral inguinal lymph nodes are not enlarged by CT criteria.  Limited visualization of the lower thorax demonstrates dependent ground-glass atelectasis.  No pleural effusion or pneumothorax. Cardiomegaly.  Small amount of pericardial fluid, presumably physiologic.  No acute or aggressive osseous abnormalities, specifically, no displaced right-sided rib fractures.  Degenerate change of the lower lumbar spine, worse at L4 - L5. Small left-sided indirect mesenteric fat containing inguinal hernia.  IMPRESSION: 1.  No explanation for patient's left upper quadrant abdominal pain.  Specifically, no evidence of enteric or urinary  obstruction. 2.  Colonic diverticulosis without evidence of diverticulitis. 3.  Shoddy retroperitoneal, pelvic and bilateral inguinal lymph nodes are presumably reactive in etiology. 4.  Cardiomegaly with small amount of pericardial fluid, presumably physiologic.  Further evaluation with cardiac echo may be performed as clinically indicated.  Original Report Authenticated By: Waynard Reeds, M.D.   Dg Chest Portable 1 View  01/31/2012  *RADIOLOGY REPORT*  Clinical Data: Short of breath.  Respiratory distress.  PORTABLE CHEST - 1 VIEW  Comparison: 01/31/2012  Findings: Decreased lung volumes are seen. Increased diffuse interstitial prominence seen, and acute interstitial edema cannot be excluded.  No focal consolidation identified.  Heart size remains prominent.  IMPRESSION: Decreased lung volumes. Increased diffuse interstitial prominence; acute interstitial edema cannot be excluded.   Original Report Authenticated By: Danae Orleans, M.D. ( 01/31/2012 12:19:35 )    I personally reviewed the imaging tests through PACS system  I reviewed available ER/hospitalization records thought the EMR      Lyanne Co, MD 01/31/12 1421

## 2012-01-31 NOTE — Progress Notes (Signed)
  Subjective:    Patient ID: [name], male    DOB: 21-Apr-1966, 46 y.o.   MRN: [id number]  HPI 1. Fall/Back pain:  Patient brought in this morning by friend due to fall over the weekend and continued back pain.  Friend states that he was evaluated in ED last night and given dilaudid and morphine at that time.  He has been difficult to arouse this morning.  Patient is arousable only for a few seconds before falling back asleep.  When awake he states that he thinks he has another ruptured disc and has pain down the back of his R leg.  He keeps perseverating that he needs something for pain each time he wakes up.      2. OSA:  Had sleep study on 7/31 ordered by [facility].  Study read as very severe sleep apnea.   This was however not a split night study and CPAP was not titrated and he has not been using anything.  States he was told by "technician" that he just needed to have his tonsils and adenoids taken out.    As above he is quite obtunded today.   Review of Systems Unable to perform ROS due to patients mental status.    Objective:   Physical Exam  Constitutional:       Obese male, obtunded.  Will arouse to name.  HENT:  Head: Normocephalic and atraumatic.  Cardiovascular: Normal rate and regular rhythm.   Pulmonary/Chest: Effort normal and breath sounds normal. No respiratory distress. He has no wheezes.  Musculoskeletal:       Patient unable to stand 2/2 to pain.  Difficult to examine back/leg due to combination of body habitus and patient inability to cooperate with exam  Neurological:       Obtunded, will arouse to name but falls back asleep after a few seconds of conversation.           Assessment & Plan:

## 2012-01-31 NOTE — ED Notes (Signed)
Pt went to [facility] for further eval of back pain and MRI. Since recent fall on Saturday. Found in office today with SPo2 sats around 84% RA. Slight respiratory distress. Pt is smoker. 18G RHand.

## 2012-01-31 NOTE — ED Notes (Signed)
MD at bedside. 

## 2012-01-31 NOTE — ED Notes (Signed)
Pt c/o of back pain across the lower radiating to right leg. Pt states left leg gave out.No LOC/N/V.pt states he lost control of bowels.

## 2012-01-31 NOTE — Assessment & Plan Note (Signed)
Likely flare of radicular pain.  I think this is the least of his problems currently given his AMS.  I would avoid any medications that may further sedate him.  Do not think emergent MRI is needed at this point.

## 2012-01-31 NOTE — ED Provider Notes (Signed)
History     CSN: [id number]  Arrival date & time 01/31/12  [name]   First MD Initiated Contact with Patient 01/31/12 0044      Chief Complaint  Patient presents with  . Back Pain    fall   HPI  History provided by the patient. Patient is a 46 year old male with history of peripheral neuropathy, obesity, chronic back pain with prior back surgeries who presents with complaints of increased low back pain. Patient states he was having some increased low back pains over the next day. He was using a cane to help him walk and move around. Patient states that he stumbled and had a fall onto his right side his legs giving out causing increased pain in his low back. He tried to rest most of the day in his home pain medications without significant improvement. She states she was having difficulty getting to the bathroom quick enough secondary to pain and mobility issues. This evening patient has to get up to use bathroom and when he said up had sudden pains causing his legs give out and him to fall. Patient states that this caused incontinence with a small BM. Patient has continued pain that radiates to the right lower extremity.    Past Medical History  Diagnosis Date  . Neuropathy   . Obesity   . Collapsed lung     Left side  . OSA (obstructive sleep apnea)     Past Surgical History  Procedure Date  . Back surgery   . Appendectomy   . Eye surgery   . Fracture surgery     No family history on file.  History  Substance Use Topics  . Smoking status: Current Everyday Smoker -- 1.0 packs/day for 32 years    Types: Cigarettes  . Smokeless tobacco: Never Used  . Alcohol Use: Yes     once a month      Review of Systems  Constitutional: Negative for fever and unexpected weight change.  HENT: Negative for neck pain.   Gastrointestinal: Negative for abdominal pain.  Musculoskeletal: Positive for back pain.  Neurological: Positive for numbness. Negative for weakness and headaches.     Allergies  Penicillins  Home Medications   Current Outpatient Rx  Name Route Sig Dispense Refill  . ALBUTEROL SULFATE HFA 108 (90 BASE) MCG/ACT IN AERS Inhalation Inhale 2 puffs into the lungs every 6 (six) hours as needed for wheezing. 1 Inhaler 0  . FUROSEMIDE 20 MG PO TABS Oral Take 1 tablet (20 mg total) by mouth daily. 30 tablet 0  . GABAPENTIN 600 MG PO TABS Oral Take 1 tablet (600 mg total) by mouth 3 (three) times daily. 90 tablet 3  . HYDROCODONE-ACETAMINOPHEN 10-325 MG PO TABS Oral Take 1 tablet by mouth every 6 (six) hours as needed. Pain    . MULTIVITAMINS PO  NO INSTRUCTIONS GIVEN     . TADALAFIL 20 MG PO TABS Oral Take 0.5 tablets (10 mg total) by mouth every other day as needed for erectile dysfunction. 5 tablet 3  . TRIAMCINOLONE ACETONIDE 0.1 % EX CREA Topical Apply topically 2 (two) times daily. Apply to lower legs 45 g 0  . UREA 40 % EX CREA  Apply to heels daily. 1 each 2    BP 147/87  Pulse 92  Resp 18  SpO2 97%  Physical Exam  Nursing note and vitals reviewed. Constitutional: He is oriented to person, place, and time. He appears well-developed and well-nourished. No distress.  HENT:  Head: Normocephalic.  Cardiovascular: Normal rate and regular rhythm.   Pulmonary/Chest: Effort normal and breath sounds normal. No respiratory distress. He has no wheezes. He has no rales.  Abdominal: Soft.       Morbidly obese.  Genitourinary: Rectum normal.       Normal rectal tone. Normal perineal sensation.  Musculoskeletal: Normal range of motion. He exhibits no edema and no tenderness.       Feet and arms with normal neurovascular exam.  Neurological: He is alert and oriented to person, place, and time.  Skin: Skin is warm.       Chronic appearing skin changes to bilateral lower extremities with hyperpigmentation consistent with likely vascular stasis.  Psychiatric: He has a normal mood and affect. His behavior is normal.    ED Course  Procedures   Results  for orders placed during the hospital encounter of 01/31/12  CBC      Component Value Range   WBC 11.8 (*) 4.0 - 10.5 K/uL   RBC 5.41  4.22 - 5.81 MIL/uL   Hemoglobin 17.5 (*) 13.0 - 17.0 g/dL   HCT 16.1 (*) 09.6 - 04.5 %   MCV 98.5  78.0 - 100.0 fL   MCH 32.3  26.0 - 34.0 pg   MCHC 32.8  30.0 - 36.0 g/dL   RDW 40.9  81.1 - 91.4 %   Platelets 173  150 - 400 K/uL  BASIC METABOLIC PANEL      Component Value Range   Sodium 133 (*) 135 - 145 mEq/L   Potassium 4.9  3.5 - 5.1 mEq/L   Chloride 93 (*) 96 - 112 mEq/L   CO2 35 (*) 19 - 32 mEq/L   Glucose, Bld 276 (*) 70 - 99 mg/dL   BUN 14  6 - 23 mg/dL   Creatinine, Ser 7.82  0.50 - 1.35 mg/dL   Calcium 8.9  8.4 - 95.6 mg/dL   GFR calc non Af Amer >90  >90 mL/min   GFR calc Af Amer >90  >90 mL/min  GLUCOSE, CAPILLARY      Component Value Range   Glucose-Capillary 284 (*) 70 - 99 mg/dL      Dg Chest 2 View  07/28/863  *RADIOLOGY REPORT*  Clinical Data: Shortness of breath and pain.  CHEST - 2 VIEW  Comparison: 01/18/2012  Findings: Cardiac enlargement with mild pulmonary vascular congestion.  No edema or consolidation.  No blunting of costophrenic angles.  No pneumothorax.  Emphysematous changes.  Old bilateral rib fractures.  No significant change since previous study.  IMPRESSION: Cardiac enlargement with mild pulmonary vascular congestion.  No focal consolidation or edema.  Original Report Authenticated By: Marlon Pel, M.D.   Dg Lumbar Spine Complete  01/31/2012  *RADIOLOGY REPORT*  Clinical Data: The back pain radiating down the right leg after fall two nights ago.  LUMBAR SPINE - COMPLETE 4+ VIEW  Comparison: 07/03/2004  Findings: Five lumbar type vertebral bodies with partial sacralization of L5.  Normal alignment of the lumbar vertebrae and facet joints.  Degenerative narrowing of the L4-5 disc space. Degenerative changes in the lower lumbar facet joints.  Mild anterior wedging of T11 and T12, stable since previous study.  No  acute compression fractures are identified.  No focal bone lesion or bone destruction.  Bone cortex and trabecular architecture appear intact.  IMPRESSION: No displaced fractures identified.  Original Report Authenticated By: Marlon Pel, M.D.     1. Chronic back pain  2. Sleep apnea   3. Hyperglycemia       MDM  1:10 AM patient seen and evaluated. Patient sleeping soundly upon entering the room. Patient did arouse easily.  Patient seen recently this month for rib pains. During that emergency room visit it was noted that patient had severe sleep apnea while on monitor. Patient had similar drops in oxygen sats while on monitor during the visit tonight. Patient has had a recent sleep study. Patient has not received a CPAP machine to this point. His PCP is aware.   No signs for DKA. Patient did have improvement with pain medications. Able to ambulate to the restroom. Will discharge patient home and he will followup with PCP.  Angus Seller, [place] 01/31/12 [phone]

## 2012-02-01 ENCOUNTER — Inpatient Hospital Stay (HOSPITAL_COMMUNITY): Payer: Medicare Other

## 2012-02-01 ENCOUNTER — Ambulatory Visit: Payer: Medicare Other | Admitting: Family Medicine

## 2012-02-01 ENCOUNTER — Encounter (HOSPITAL_COMMUNITY): Payer: Self-pay | Admitting: *Deleted

## 2012-02-01 DIAGNOSIS — M549 Dorsalgia, unspecified: Secondary | ICD-10-CM

## 2012-02-01 LAB — POCT I-STAT 3, ART BLOOD GAS (G3+)
Acid-Base Excess: 8 mmol/L — ABNORMAL HIGH (ref 0.0–2.0)
Acid-Base Excess: 8 mmol/L — ABNORMAL HIGH (ref 0.0–2.0)
Bicarbonate: 39.9 mEq/L — ABNORMAL HIGH (ref 20.0–24.0)
O2 Saturation: 97 %
O2 Saturation: 99 %
Patient temperature: 99.2
Patient temperature: 98.4
TCO2: 46 mmol/L (ref 0–100)
pCO2 arterial: 118.2 mmHg (ref 35.0–45.0)
pH, Arterial: 7.169 — CL (ref 7.350–7.450)
pO2, Arterial: 106 mmHg — ABNORMAL HIGH (ref 80.0–100.0)
pO2, Arterial: 126 mmHg — ABNORMAL HIGH (ref 80.0–100.0)

## 2012-02-01 LAB — COMPREHENSIVE METABOLIC PANEL
AST: 28 U/L (ref 0–37)
Albumin: 3.3 g/dL — ABNORMAL LOW (ref 3.5–5.2)
Alkaline Phosphatase: 78 U/L (ref 39–117)
Chloride: 94 mEq/L — ABNORMAL LOW (ref 96–112)
Creatinine, Ser: 1.02 mg/dL (ref 0.50–1.35)
Potassium: 4.6 mEq/L (ref 3.5–5.1)
Total Bilirubin: 0.2 mg/dL — ABNORMAL LOW (ref 0.3–1.2)

## 2012-02-01 LAB — CBC
HCT: 50.1 % (ref 39.0–52.0)
MCH: 32.4 pg (ref 26.0–34.0)
MCV: 100.2 fL — ABNORMAL HIGH (ref 78.0–100.0)
Platelets: 159 10*3/uL (ref 150–400)
RBC: 5 MIL/uL (ref 4.22–5.81)

## 2012-02-01 LAB — HEMOGLOBIN A1C
Hgb A1c MFr Bld: 9.5 % — ABNORMAL HIGH (ref <5.7)
Mean Plasma Glucose: 226 mg/dL — ABNORMAL HIGH (ref <117)

## 2012-02-01 LAB — BLOOD GAS, ARTERIAL
Acid-Base Excess: 11.6 mmol/L — ABNORMAL HIGH (ref 0.0–2.0)
Drawn by: 36277
Inspiratory PAP: 14
pCO2 arterial: 105 mmHg (ref 35.0–45.0)
pH, Arterial: 7.201 — ABNORMAL LOW (ref 7.350–7.450)
pO2, Arterial: 112 mmHg — ABNORMAL HIGH (ref 80.0–100.0)

## 2012-02-01 MED ORDER — ACETAZOLAMIDE SODIUM 500 MG IJ SOLR
250.0000 mg | Freq: Two times a day (BID) | INTRAMUSCULAR | Status: AC
  Administered 2012-02-01 – 2012-02-02 (×4): 250 mg via INTRAVENOUS
  Filled 2012-02-01 (×5): qty 500

## 2012-02-01 MED ORDER — ACETAMINOPHEN 325 MG PO TABS
650.0000 mg | ORAL_TABLET | ORAL | Status: DC | PRN
  Administered 2012-02-01 – 2012-02-02 (×2): 650 mg via ORAL
  Filled 2012-02-01 (×2): qty 2

## 2012-02-01 MED ORDER — INSULIN ASPART 100 UNIT/ML ~~LOC~~ SOLN
6.0000 [IU] | Freq: Once | SUBCUTANEOUS | Status: AC
  Administered 2012-02-01: 6 [IU] via SUBCUTANEOUS

## 2012-02-01 NOTE — Progress Notes (Signed)
Name: WESSLEY EMERT MRN: [id number] DOB: 1966/01/28    LOS: 1  [facility] Note   History of Present Illness: 46 y/o M with PMH of Morbid Obesity, OSA, left pneumothorax, and chronic back pain who was seen in ER on 8/18 with a 2 day hx of back pain and shortness of breath.  Followed up with PCP 8/19 and was noted to be hypoxic with saturations in 70's -80's on 2L.  Was transferred via EMS to ER on 8/19 - noted to drift off to sleep during conversation, placed on 50% O2 with improvement in saturations.  Noted recent fall with difficulty walking and radicular pain.  ABG eval demonstrated ph 7.23 / pCO2 93 / pO2 117 / HCO3 39, CXR with no acute infiltrate.  Placed on bipap, admitted per IM TS & PCCM consulted for decompensated OHS/OSA.    Lines / Drains:  Cultures:  Antibiotics:  Tests / Events: 01/12/12 Sleep Study>>>Very severe obstructive sleep apnea/hypopnea syndrome, AHI 132 per hour with non-positional events. Moderately loud snoring with oxygen desaturation to a nadir of 58% and mean oxygen saturation through the study of 79.6% on room air. 320 minutes of total recording time were recorded with room air oxygen saturation less  than 88%. Oxygen saturation on room air while awake was 91%. This is a diagnostic NPSG protocol as ordered. Consider early return for dedicated CPAP titration study or evaluate for alternative management as clinically indicated.  Subjective:  Multiple episodes when he removed BiPAP for 30-60 min at at time Pt wide awake but pH still 7.20/ 105. norco given x 1 last night. Complaining of severe back pain - asking me when can he get MRI to eval ruptured disc.   Vital Signs: Temp:  [98 F (36.7 C)-99.2 F (37.3 C)] 98.4 F (36.9 C) (08/20 0812) Pulse Rate:  [82-106] 88  (08/20 0820) Resp:  [14-29] 29  (08/20 0500) BP: (106-162)/(56-99) 132/75 mmHg (08/20 0820) SpO2:  [77 %-100 %] 95 % (08/20 0820) FiO2 (%):  [50 %-60 %] 50 % (08/20  0500) Weight:  [131.543 kg (290 lb)-132.5 kg (292 lb 1.8 oz)] 132.5 kg (292 lb 1.8 oz) (08/19 1542) I/O last 3 completed shifts: In: 1370 [P.O.:1200; I.V.:170] Out: 2700 [Urine:2700]  Physical Examination: General: morbidly obese on biapap Neuro: somnolent, arouses to name CV: s1s2 rrr PULM: resp's even/non-labored, lungs bilaterally diminished GI: abd obese / soft, bsx4 active Extremities: warm/dry   Ventilator settings / FiO2: Vent Mode:  [-]  FiO2 (%):  [50 %-60 %] 50 %  Labs    CBC  Lab 02/01/12 0505 01/31/12 1556 01/31/12 1220  HGB 16.2 16.6 16.8  HCT 50.1 51.8 50.6  WBC 9.4 10.6* 11.6*  PLT 159 157 164     BMET  Lab 02/01/12 0505 01/31/12 1556 01/31/12 1220 01/31/12 0455  NA 140 -- 137 133*  K 4.6 -- 4.8 --  CL 94* -- 96 93*  CO2 33* -- 37* 35*  GLUCOSE 287* -- 306* 276*  BUN 16 -- 15 14  CREATININE 1.02 0.86 0.85 0.89  CALCIUM 9.0 -- 9.0 8.9  MG -- -- -- --  PHOS -- -- -- --    Lab 02/01/12 0353 01/31/12 2045 01/31/12 1239  PHART 7.201* 7.296* 7.231*  PCO2ART 105.0* 80.3* 93.8*  PO2ART 112.0* 149.0* 117.0*  HCO3 39.7* 39.3* 39.4*  TCO2 42.9 42 42  O2SAT 98.0 99.0 97.0     Radiology: 8/19 CXR>>>Decreased lung volumes. Increased diffuse interstitial prominence;  acute interstitial edema cannot be excluded.   Assessment and Plan: Principal Problem:  *Hypercarbia Active Problems:  OBESITY, NOS  TOBACCO DEPENDENCE  HYPERTENSION, BENIGN SYSTEMIC  OSA (obstructive sleep apnea)  Altered mental status   Acute hypercarbic respiratory failure -  Assessment -in setting of decompensated OHS / OSA.  Recent fall with ED visit - unclear if narcotics were added to regimen in last few days with fall or if chronic but likely contributing to decompensation of OSA.  Sleep study 7/13 (see above results) with severe obstructive sleep apnea, CPAP hasn't been started yet - pending set-up.  Current tobacco abuse.  CXR findings likely atx & difficult to penetrate due  to size.  He is AMAZINGLY well-compensated 8/20 given his ABG results, clearly chronically hypercapneic. Suspect his rising pCO2 related to poor compliance BiPAP last night + our gentle diuresis 8/19 and increased metabolic alk.   Plan:  -mandatory bipap now, increase to 20/ 10; need better compliance -he is asking for pain meds and for [place] to address his probable ruptured disc. Difficult situation as narcs at this stage could result in intubation. Will have to negotiate with him to hold off/minimize -titrate oxygen to keep sats ~90%; He does not need a pO2 of 112! This will increase V/Q mismatch and make his ventilation less efficient  -follow up ABG this pm and prn -if his MS changes or his pH drops on subsequent ABG's then I would move him to ICU as precaution even though he looks good clinically -NPO -hold further diuresis -diamox x 4 to drive CO2 down, increase metabolic stimulus to blow his pCO2 down -smoking cessation, albuterol prn   Levy Pupa, MD, PhD 02/01/2012, 9:09 AM [facility] [phone] or if no answer [phone]

## 2012-02-01 NOTE — Progress Notes (Signed)
Pt off Bipap for now will resume bipap tonight. No Resp distress noted, pt ordered nutrition, pt states that he needs something for pain. Dr in to see Patient education done re: pain medications. Parents at bedside.

## 2012-02-01 NOTE — Progress Notes (Signed)
Seen and examined.  Acute on chronic respiratory failure continues as does bipap.  Resp Rx was in to draw ABG as I was concluding my visit.  He is now off narcotics and more alert (I still had to wake him on entering the room, but he did stay awake for the full interview.)  I am cautiously optimistic that we are making some progress.

## 2012-02-01 NOTE — Progress Notes (Signed)
Pt was aware that RN needed to obtain a urine specimen and pt stated that he was unable to void in the urinal because his penis was inverted. However, earlier in the shift the pt voided over in the urinal and RN emptied before MD placed order for urine drug screen. Then on another occasion the pt stated that he had voided another that he had dumped into the toilet on accident. This also was after the pt knew that RN needed a urine sample.

## 2012-02-01 NOTE — Progress Notes (Signed)
Pt. Is non compliant with staff about his care. Pt. Stated that he would wear BIPAP for one hour & if MD hasn't came to see him he will leave. Pt. Has been going to the bathroom and sitting for 30-45 mins. at a time without any oxygen even after we hooked up O2 extension tubing. Pt. Has made frequent request for food, water & pain meds during the night. CCM as well as teaching services was contacted by RT & given critical value ABG results & told about pt.'s non compliance. Teaching services was paged by RN & they came to see pt. Pt. Is now requesting to see an "emergeny medicine doctor within the hour or he is going to [place]".

## 2012-02-01 NOTE — Progress Notes (Signed)
Inpatient Diabetes Program Recommendations  AACE/ADA: New Consensus Statement on Inpatient Glycemic Control (2013)  Target Ranges:  Prepandial:   less than 140 mg/dL      Peak postprandial:   less than 180 mg/dL (1-2 hours)      Critically ill patients:  140 - 180 mg/dL    Inpatient Diabetes Program Recommendations Insulin - Basal: add Lantus 15 units  HgbA1C: =9.5    Note: New onset DM?  Do not see in history.  Will need basic inpatient education before discharge.  Will need follow-up with primary MD for management.  Will also need OP education.   Will follow.  Thank you  Piedad Climes [facility] Inpatient Diabetes Coordinator [phone]

## 2012-02-01 NOTE — Progress Notes (Signed)
Orthopedic Tech Progress Note Patient Details:  [name] [birth year] [id number] Applied overhead frame and trapeze bar.     Jennye Moccasin 02/01/2012, 9:16 PM

## 2012-02-01 NOTE — Progress Notes (Signed)
Spoke with pt in regards to his resp status informing him that he really needs to try and stay in bed to preserve as much oxygen as possible. However, pt states that he needs a break from bipap and a snack. Pt gets up and stays in the bathroom without any oxygen on at all stating that he can not use our bedside commodes even if we get him a larger one. Pt stays in the bathroom for over 20-30 min at a time and this is the second occurrence tonight. Pt oxygen sats are around 70s-80s when pt gets back from bathroom and it takes a while before they slowly creep back up to the 90s. MD notified and aware of pts persistence and impulsiveness. Will continue to monitor.

## 2012-02-01 NOTE — Progress Notes (Signed)
Patient ID: [name], male   DOB: 05-Jan-1966, 46 y.o.   MRN: [id number] Family Medicine Teaching Service Daily Progress Note Service Page: [phone]   Subjective:  Overnight per nursing report [name] was resistant to wearing the Bipap machine.  He did not want to use the bedside commode and would spend time in the bathroom without his Bipap, causing his sats to drop to the 60s.  He frequently asked for pain medicine and food.  On interview this morning the patient was able to have a longer conversation without falling asleep and is much clinically improved from admission.  The patient was primarily concerned with working up his back pain.  He wants an MRI but he reports that he requires sedation for this procedure.  He agreed to wear the Bipap today as he understands he cannot have an MRI until this issue has resolved.  His back pain is bothering him and he is agreeable to non-narcotic use as he understands this is best for his CO2 levels.  He understands he is NPO for right now until his ABG improves.     Objective: Temp:  [98 F (36.7 C)-99.2 F (37.3 C)] 98.4 F (36.9 C) (08/20 0812) Pulse Rate:  [82-116] 82  (08/20 0758) Resp:  [14-29] 29  (08/20 0500) BP: (106-162)/(56-99) 132/75 mmHg (08/20 0758) SpO2:  [61 %-100 %] 95 % (08/20 0758) FiO2 (%):  [50 %-60 %] 50 % (08/20 0500) Weight:  [290 lb (131.543 kg)-292 lb 1.8 oz (132.5 kg)] 292 lb 1.8 oz (132.5 kg) (08/19 1542) Exam: General: alert man in no acute distress on Bipap machine Cardiovascular: regular rate and rhythm, difficult to auscultate due to body habitus Respiratory: no crackles or wheezes Abdomen: appears distended but pt says is at baseline, normal bowel sounds Extremities:1+ pitting edema bilaterally, skin changes from venous stasis  I have reviewed the patient's medications, labs, imaging, and diagnostic testing.  Notable results are summarized below.  CBC BMET   Lab 02/01/12 0505 01/31/12 1556 01/31/12  1220  WBC 9.4 10.6* 11.6*  HGB 16.2 16.6 16.8  HCT 50.1 51.8 50.6  PLT 159 157 164    Lab 02/01/12 0505 01/31/12 1556 01/31/12 1220 01/31/12 0455  NA 140 -- 137 133*  K 4.6 -- 4.8 4.9  CL 94* -- 96 93*  CO2 33* -- 37* 35*  BUN 16 -- 15 14  CREATININE 1.02 0.86 0.85 --  GLUCOSE 287* -- 306* 276*  CALCIUM 9.0 -- 9.0 8.9     Imaging/Diagnostic Tests:   CXR 8/19 3:11 AM Findings: Cardiac enlargement with mild pulmonary vascular  congestion. No edema or consolidation. No blunting of  costophrenic angles. No pneumothorax. Emphysematous changes. Old  bilateral rib fractures. No significant change since previous  study.  IMPRESSION:  Cardiac enlargement with mild pulmonary vascular congestion. No  focal consolidation or edema.    CXR 8/19 12:05 PM Findings: Decreased lung volumes are seen. Increased diffuse  interstitial prominence seen, and acute interstitial edema cannot  be excluded. No focal consolidation identified. Heart size  remains prominent.  IMPRESSION:  Decreased lung volumes. Increased diffuse interstitial prominence;  acute interstitial edema cannot be excluded.    CXR 8/20 AM IMPRESSION:  1. Cardiomegaly with resolution of interstitial edema.  2. Similar patchy right greater than left lower lobe predominant  atelectasis.   Assessment/Plan:   [name] is a 46 yo male with a hx of obesity, diabetes, htn, and sleep apnea who presented in hypercarbic respiratory failure.  Bipap was started and ABG's initially improved, but patient compliance issues resulted in a worsening ABG.  1. Hypercarbic respiratory failure: ABG in ED was 7.2/93.8/117/39.4  -most recent ABG 7.1/118.2/98/42 (0813 8/20) which is worse from previous 7.2/105/112/39 (0353 8/20) -respiratory saw this morning and increased bipap settings.  ABG for 1200 ordered.  If is still declining will transfer to ICU. -repeat CBC, CMP in AM--> showed decreased WBC (9.4), Cr up to 1.02 (from  0.86) -continue Bipap -continuous pulse ox and cardiac monitoring in step-down unit  -pulm consult called, greatly appreciate their assistance in managing this pt, ?if patient will need a trach in the future as his labs show a likely acute on chronic hypercarbic respiratory failure  -albuterol prn   2. Back pain:per patient is his primary concern as he thinks he has a ruptured disk as he fell recently and that is why he went to the [facility] ED initially.  Wants an MRI but will require sedation per patient and previous notes. -IV Toradol  -avoid narcotics secondary to already hypercapnic respiratory failure and multiple co-morbidities.  Will talk with pharmacy about other options. -gave Norco overnight as clinical picture improved, d/c'd after CO2 increased again  3.  Lower extremity swelling: known venous stasis, has taken lasix before to decrease swelling and gabapentin for -received 40 mg of Lasix yesterday in the ED -will monitor  4.  Severe sleep apnea: results from sleep study showed AHI 132 per hr with recs for quick follow-up and Bipap -arrange for bipap on discharge  5. Diabetes: no known diagnosis, although with elevated CBGs at this time  -A1C is 9.5 -s/p 6 u Novolog 8/19 at 1330  -start SSI  -hold neurontin while sleepy/sedated   6. HTN  -monitor, is not on any home meds at this time   7. Tobacco abuse: smokes ~ 2 ppd  -nicotine patch ( )   8. FEN/GI  -NPO  -nursing providing mouth swabs with instructions to resume Bipap quickly -d/c NS as pt does not appear dehydrated  9. Ppx  -subcut heparin   10. Dispo  -pending resolution of altered mental status 2/2 hypercarbia and plan to control sleep apnea    Georgina Pillion, Med Student 02/01/2012, 8:14 AM  UPPER LEVEL ADDENDUM  I have seen and examined [name] with Georgina Pillion, MS4 and I agree with the above assessment/plan. I have reviewed all available data and have made any necessary changes  above.  Briefly, patient is much more awake today.  Per nursing reports, pt had multiple episodes of not having BiPAP in place last night, although patient reports he had it on all night.  Also received one dose of norco last night for his back pain, although patient reports this was "forced" on him.  He states he doesn't want to take "that pain medicine" if "that's the reason" he was so sleepy.  However, patient continuously asking for something for his back pain as well as an MRI to assess the worsened pain.  He reports that he does require heavy sedation for MRIs due to anxiety.  Regardless, he is unable to transport to MRI while on continuous bipap  PE: Gen: still with some episodes of falling asleep, but significantly more awake than on admission; NAD, bipap in place CV: regular Pulm: faint wheezes, although difficult to hear over bipap Abd: tense, distended, NO fluid wave, + BS Ext: 1+ BLE pitting edema; chronic statis changes bilateral LE  A/P: 46 yo M w/ h/o  obesity, OSA, DM, HTN who presented with somnolence, found to be in hypercarbic respiratory failure and started on bipap  1. Somnolence/respiratory failure-- improved, despite worsening ABGs, likely due to combination of noncompliance with bipap overnight and narcotics overnight -continue to avoid ALL narcotics -bipap -NPO! Will allow pt to use mouth swabs, otherwise bipap needs to be on at all times until noon ABG -pulm consulted, greatly appreciate their assistance and recommendations -recheck ABG at noon; if worsening, will send to ICU per pulm rec's  2. Back pain: pt will eventually need evaluation, but unable to received MRI at this time 2/2 Bipap as well as reported need for heavy sedation in an already tenuous patient -toradol IV and tylenol po PRN for now -MRI in the future when pt becomes stable, may need to be done as outpatient   Dispo: pending improvement. Needs bipap/cpap at home for OSA art d/c.    Stevey Stapleton 02/01/2012, 12:07 PM

## 2012-02-02 ENCOUNTER — Encounter (HOSPITAL_COMMUNITY): Payer: Self-pay | Admitting: *Deleted

## 2012-02-02 DIAGNOSIS — E119 Type 2 diabetes mellitus without complications: Secondary | ICD-10-CM

## 2012-02-02 DIAGNOSIS — G8929 Other chronic pain: Secondary | ICD-10-CM

## 2012-02-02 DIAGNOSIS — E1149 Type 2 diabetes mellitus with other diabetic neurological complication: Secondary | ICD-10-CM

## 2012-02-02 DIAGNOSIS — F17200 Nicotine dependence, unspecified, uncomplicated: Secondary | ICD-10-CM

## 2012-02-02 LAB — GLUCOSE, CAPILLARY
Glucose-Capillary: 283 mg/dL — ABNORMAL HIGH (ref 70–99)
Glucose-Capillary: 245 mg/dL — ABNORMAL HIGH (ref 70–99)

## 2012-02-02 LAB — POCT I-STAT 3, ART BLOOD GAS (G3+)
Acid-Base Excess: 1 mmol/L (ref 0.0–2.0)
Bicarbonate: 28.1 mEq/L — ABNORMAL HIGH (ref 20.0–24.0)
O2 Saturation: 96 %
Patient temperature: 97.1
TCO2: 30 mmol/L (ref 0–100)

## 2012-02-02 LAB — CBC
HCT: 48.8 % (ref 39.0–52.0)
MCH: 31.6 pg (ref 26.0–34.0)
MCV: 96.3 fL (ref 78.0–100.0)
RBC: 5.07 MIL/uL (ref 4.22–5.81)
WBC: 9 10*3/uL (ref 4.0–10.5)

## 2012-02-02 LAB — BASIC METABOLIC PANEL
BUN: 16 mg/dL (ref 6–23)
Calcium: 9.7 mg/dL (ref 8.4–10.5)
GFR calc Af Amer: >90 mL/min (ref >90)
GFR calc non Af Amer: >90 mL/min (ref >90)
Glucose, Bld: 221 mg/dL — ABNORMAL HIGH (ref 70–99)
Potassium: 4.1 mEq/L (ref 3.5–5.1)
Sodium: 139 mEq/L (ref 135–145)

## 2012-02-02 MED ORDER — HYDROCODONE-ACETAMINOPHEN 5-325 MG PO TABS
1.0000 | ORAL_TABLET | Freq: Four times a day (QID) | ORAL | Status: DC | PRN
  Administered 2012-02-02 – 2012-02-03 (×2): 1 via ORAL
  Filled 2012-02-02 (×2): qty 1

## 2012-02-02 MED ORDER — CLINDAMYCIN HCL 300 MG PO CAPS
300.0000 mg | ORAL_CAPSULE | Freq: Three times a day (TID) | ORAL | Status: DC
  Administered 2012-02-02 – 2012-02-03 (×2): 300 mg via ORAL
  Filled 2012-02-02 (×6): qty 1

## 2012-02-02 MED ORDER — GABAPENTIN 600 MG PO TABS
600.0000 mg | ORAL_TABLET | Freq: Three times a day (TID) | ORAL | Status: DC
  Administered 2012-02-02 – 2012-02-03 (×4): 600 mg via ORAL
  Filled 2012-02-02 (×7): qty 1

## 2012-02-02 MED ORDER — HYDROCODONE-ACETAMINOPHEN 5-325 MG PO TABS
1.0000 | ORAL_TABLET | Freq: Once | ORAL | Status: AC
  Administered 2012-02-02: 1 via ORAL
  Filled 2012-02-02: qty 1

## 2012-02-02 MED ORDER — BACITRACIN ZINC 500 UNIT/GM EX OINT
TOPICAL_OINTMENT | CUTANEOUS | Status: AC
  Administered 2012-02-02: 15.5556 via TOPICAL
  Filled 2012-02-02 (×2): qty 15

## 2012-02-02 MED ORDER — LIDOCAINE HCL (PF) 1 % IJ SOLN
5.0000 mL | Freq: Once | INTRAMUSCULAR | Status: AC
  Administered 2012-02-02: 5 mL
  Filled 2012-02-02: qty 5

## 2012-02-02 MED ORDER — PNEUMOCOCCAL VAC POLYVALENT 25 MCG/0.5ML IJ INJ
0.5000 mL | INJECTION | INTRAMUSCULAR | Status: AC
  Administered 2012-02-03: 0.5 mL via INTRAMUSCULAR
  Filled 2012-02-02: qty 0.5

## 2012-02-02 NOTE — Progress Notes (Signed)
Seen and examined.  Discussed with Dr. Fara Boros.  Agree with her management.  Briefly, [name] seems to have turned the corner on his OSA and Obesity hypoventilation syndrome.  He also seems quite motivated now to make the needed lifestyle changes to regain his health.  I hope he can keep this motivation long term.  I believe we can likely DC him tomorrow if we can arrange the home bipap.  I am pleased with his progress - both mental and physical - over the last 48 hours.

## 2012-02-02 NOTE — Procedures (Signed)
This procedure was performed at the bedside in the patient's room. Informed consent was obtained after explaining to patient the procedure, including its risks and benefits. A time out was called in which the patient's name, date of birth, and procedure to be done were announced and agreed upon.  The abscess was located on the medial aspect of the left leg, just inferior to the groin. It measured approximately 6cm x 3 cm and did not extend into the scrotal area. The area was prepped with iodine solution and the patient was given local anesthesia using injected lidocaine. An approximately 3 cm incision was made along the length of the abscess, which resulted in immediate release of bloody, malodorous, purulent fluid. The abscess was explored using a hemostat and manually, with further expression of purulent fluid. The incision was packed using an iodoform packing strip, with approximately  2.5 cm of the packing strip left hanging outside the incision. The area was then cleaned using 10cc of saline and 4x4 gauze. The patient tolerated the procedure well and was able to ambulate to the bathroom immediately after the procedure with the assistance of his walker. Estimated blood loss = 50cc.  The procedure was supervised by attending physician Dr. Tivis Ringer.

## 2012-02-02 NOTE — Progress Notes (Signed)
Attempted to call patient's wife, at both numbers [phone] or [phone]) this morning with no answer at either number.  Will try again later this morning if time permits.  Jarius Dieudonne 02/02/2012, 8:24 AM

## 2012-02-02 NOTE — Progress Notes (Signed)
Name: [name] MRN: [id number] DOB: January 02, 1966    LOS: 2  [place] [facility] Note   History of Present Illness: 46 y/o M with PMH of Morbid Obesity, OSA, left pneumothorax, and chronic back pain who was seen in ER on 8/18 with a 2 day hx of back pain and shortness of breath.  Followed up with PCP 8/19 and was noted to be hypoxic with saturations in 70's -80's on 2L.  Was transferred via EMS to ER on 8/19 - noted to drift off to sleep during conversation, placed on 50% O2 with improvement in saturations.  Noted recent fall with difficulty walking and radicular pain.  ABG eval demonstrated ph 7.23 / pCO2 93 / pO2 117 / HCO3 39, CXR with no acute infiltrate.  Placed on bipap, admitted per IM TS & PCCM consulted for decompensated OHS/OSA.    Lines / Drains:  Cultures:  Antibiotics:  Tests / Events: 01/12/12 Sleep Study>>>Very severe obstructive sleep apnea/hypopnea syndrome, AHI 132 per hour with non-positional events. Moderately loud snoring with oxygen desaturation to a nadir of 58% and mean oxygen saturation through the study of 79.6% on room air. 320 minutes of total recording time were recorded with room air oxygen saturation less  than 88%. Oxygen saturation on room air while awake was 91%. This is a diagnostic NPSG protocol as ordered. Consider early return for dedicated CPAP titration study or evaluate for alternative management as clinically indicated.  Subjective:  Nice improvement in pH and pCO2 over last 24 hours. Wore BiPAP last night and willing to use qhs.   Vital Signs: Temp:  [97.9 F (36.6 C)-98.8 F (37.1 C)] 97.9 F (36.6 C) (08/21 0730) Pulse Rate:  [68-94] 79  (08/21 0730) Resp:  [17-32] 25  (08/21 0730) BP: (93-148)/(64-88) 141/88 mmHg (08/21 0730) SpO2:  [91 %-97 %] 97 % (08/21 0730) FiO2 (%):  [30 %] 30 % (08/20 1211) I/O last 3 completed shifts: In: 2430 [P.O.:2430] Out: 1700 [Urine:1700]  Physical Examination: General: morbidly obese,  awake Neuro: much more awake, interacting normally, moves all ext CV: s1s2 rrr PULM: resp's even/non-labored, lungs bilaterally diminished GI: abd obese / soft, bsx4 active Extremities: warm/dry   Ventilator settings / FiO2: Vent Mode:  [-]  FiO2 (%):  [30 %] 30 %  Labs    CBC  Lab 02/02/12 0558 02/01/12 0505 01/31/12 1556  HGB 16.0 16.2 16.6  HCT 48.8 50.1 51.8  WBC 9.0 9.4 10.6*  PLT 162 159 157     BMET  Lab 02/02/12 0558 02/01/12 0505 01/31/12 1556 01/31/12 1220 01/31/12 0455  NA 139 140 -- 137 133*  K 4.1 4.6 -- -- --  CL 101 94* -- 96 93*  CO2 32 33* -- 37* 35*  GLUCOSE 221* 287* -- 306* 276*  BUN 16 16 -- 15 14  CREATININE 0.94 1.02 0.86 0.85 0.89  CALCIUM 9.7 9.0 -- 9.0 8.9  MG -- -- -- -- --  PHOS -- -- -- -- --    Lab 02/02/12 0827 02/01/12 1651 02/01/12 1204 02/01/12 0813 02/01/12 0353  PHART 7.341* 7.464* 7.262* 7.169* 7.201*  PCO2ART 51.4* 46.8* 88.5* 118.2* 105.0*  PO2ART 86.0 126.0* 106.0* 98.0 112.0*  HCO3 28.1* 33.5* 39.9* 42.8* 39.7*  TCO2 30 35 43 46 42.9  O2SAT 96.0 99.0 97.0 94.0 98.0     Radiology: 8/19 and 8/20 CXR>>>Decreased lung volumes. Increased diffuse interstitial prominence; acute interstitial edema cannot be excluded.   Assessment and Plan: Principal Problem:  *  Hypercarbia Active Problems:  OBESITY, NOS  TOBACCO DEPENDENCE  HYPERTENSION, BENIGN SYSTEMIC  OSA (obstructive sleep apnea)  Altered mental status   Acute hypercarbic respiratory failure -  Assessment -in setting of decompensated OHS / OSA.  Recent fall with ED visit where he received extra narcs, contributing to decompensation of OSA.  Sleep study 7/13 (see above results) with severe obstructive sleep apnea, CPAP titration hasn't been done yet.  Current tobacco abuse.  CXR findings likely atx & difficult to penetrate due to size.  He was AMAZINGLY well-compensated 8/20 given his ABG results, clearly chronically hypercapneic. Markedly improved clinically and by ABG  on 8/21. He certainly qualifies for BiPAP set-up for home based on his ABG and his recent sleep study, even though BiPAP titration hasn't been done yet.   Plan:  -mandatory bipap qhs and whenever sleeping. Would use 20/10 for now, send him home on this (he qualifies based on ABG), and then set up dedicated BiPAP titration study in sleep lab as an outpt.  -difficult situation given severe back pain, but will have to try to minimize narcs -titrate oxygen to keep sats ~90%; Hyperoxygenation will increase V/Q mismatch and make his ventilation less efficient  -diamox x 4 to drive CO2 down, increase metabolic stimulus to blow his pCO2 down (finished) -smoking cessation >> he hasn't smoked since Saturday, has no intention of restarting -not on scheduled BD's, will need to go home on at least albuterol prn. Consider home on Spiriva with plans for future PFT, but he will need meds that are covered by Medicare.    Levy Pupa, MD, PhD 02/02/2012, 9:43 AM [facility] [phone] or if no answer [phone]

## 2012-02-02 NOTE — Progress Notes (Signed)
Inpatient Diabetes Program Recommendations  AACE/ADA: New Consensus Statement on Inpatient Glycemic Control (2013)  Target Ranges:  Prepandial:   less than 140 mg/dL      Peak postprandial:   less than 180 mg/dL (1-2 hours)      Critically ill patients:  140 - 180 mg/dL  Results for [name], [name] (MRN [id number]) as of 02/02/2012 12:54  Ref. Range 02/01/2012 19:48 02/02/2012 07:32 02/02/2012 12:28  Glucose-Capillary Latest Range: 70-99 mg/dL 409 (H) 811 (H) 914 (H)    Inpatient Diabetes Program Recommendations Insulin - Basal: add Lantus 15 units  HgbA1C: =9.5    Note: Fasting CBG still >200.  Addition of basal insulin recommended.  Once diagnosis of DM is made, basic inpatient education can begin.    Thank you  Piedad Climes RN,BSN,CDE Inpatient Diabetes Coordinator [phone] (team pager)

## 2012-02-02 NOTE — Progress Notes (Signed)
Pt arrived to the unit with dx. Sob. Pt alert and oriented x4. Ambulatory with stand by assist. VS stable. Oriented to the unit. Denies and distress. Will cont to monitor.

## 2012-02-02 NOTE — Procedures (Signed)
I was physically present and supervised this entire procedure.  I agree with the documentation of Dr. Pollie Meyer.

## 2012-02-02 NOTE — Progress Notes (Signed)
Patient ID: [name], male   DOB: February 16, 1966, 46 y.o.   MRN: [id number] Family Medicine Teaching Service Daily Progress Note Service Page: [phone]  Subjective:  Yesterday his afternoon ABG was improved (pH 7.46, CO2 46.8) so he was allowed to wear a nasal cannula for the afternoon and eat and drink.  He wore his BiPAP overnight.  This morning he complains of worsening back pain and an abscess in his left groin area.  Objective: Temp:  [97.9 F (36.6 C)-98.8 F (37.1 C)] 97.9 F (36.6 C) (08/21 0730) Pulse Rate:  [68-94] 79  (08/21 0730) Resp:  [17-32] 25  (08/21 0730) BP: (93-148)/(64-88) 141/88 mmHg (08/21 0730) SpO2:  [91 %-97 %] 97 % (08/21 0730) FiO2 (%):  [30 %] 30 % (08/20 1211) Exam: General: in pain, adjusting position in bed because of back pain CV: regular rate and rhythm Resp: no crackles or wheezes Lower extremities: still 2+ pitting edema, painful, venous stasis changes.  Pt says are not worse than normal. Other: indurated abscess in left groin area approximately 1 inch in diameter.  I have reviewed the patient's medications, labs, imaging, and diagnostic testing.  Notable results are summarized below.  CBC BMET   Lab 02/02/12 0558 02/01/12 0505 01/31/12 1556  WBC 9.0 9.4 10.6*  HGB 16.0 16.2 16.6  HCT 48.8 50.1 51.8  PLT 162 159 157    Lab 02/02/12 0558 02/01/12 0505 01/31/12 1556 01/31/12 1220  NA 139 140 -- 137  K 4.1 4.6 -- 4.8  CL 101 94* -- 96  CO2 32 33* -- 37*  BUN 16 16 -- 15  CREATININE 0.94 1.02 0.86 --  GLUCOSE 221* 287* -- 306*  CALCIUM 9.7 9.0 -- 9.0     Imaging/Diagnostic Tests: CXR 8/19 3:11 AM  Findings: Cardiac enlargement with mild pulmonary vascular  congestion. No edema or consolidation. No blunting of  costophrenic angles. No pneumothorax. Emphysematous changes. Old  bilateral rib fractures. No significant change since previous  study.  IMPRESSION:  Cardiac enlargement with mild pulmonary vascular congestion. No  focal  consolidation or edema.  CXR 8/19 12:05 PM  Findings: Decreased lung volumes are seen. Increased diffuse  interstitial prominence seen, and acute interstitial edema cannot  be excluded. No focal consolidation identified. Heart size  remains prominent.  IMPRESSION:  Decreased lung volumes. Increased diffuse interstitial prominence;  acute interstitial edema cannot be excluded.  CXR 8/20 AM  IMPRESSION:  1. Cardiomegaly with resolution of interstitial edema.  2. Similar patchy right greater than left lower lobe predominant  atelectasis.   Assessment and Plan: [name] is a 46 yo male with a hx of obesity, diabetes, htn, and sleep apnea who presented in hypercarbic respiratory failure. Bipap was started and ABG's are now improving.  1. Hypercarbic respiratory failure: ABG in ED was 7.2/93.8/117/39.4  -most recent ABG 8/20 1600 7.46/46.8/126/33.8 -previous ABG's: (8/20 0813) 7.1/118.2/98/42,  (8/20 0353) 7.2/105/112/39   -f/u ABG this AM -on nasal cannula for the day, if gets sleepy after pain medicine will resume Bipap -repeat CBC, CMP in AM--> showed decreased WBC (5), Cr at 0.94. -continuous pulse ox and cardiac monitoring in step-down unit, potential transfer to floor bed today -pulm consult called, greatly appreciate their assistance in managing this pt, ?if patient will need a trach in the future as his labs show a likely acute on chronic hypercarbic respiratory failure  -albuterol prn   2. Back pain:per patient is his primary concern as he thinks he has a ruptured  disk as he fell recently and that is why he went to the [facility] ED initially. Wants an MRI but will require sedation per patient and previous notes.  -IV Toradol 30 mg q 6 prn -give one dose of Norco this AM since respiratory status is much improved.  Will monitor and if is sleepy will put Bipap back on.  Pt says at home usually takes 4 Norco tablets a day, 2 in the morning and 2 at night. -will resume home  gabapentin  3. Lower extremity swelling: known venous stasis, has taken lasix before to decrease swelling -received 40 mg of Lasix yesterday in the ED  -will monitor   4. Severe sleep apnea: results from sleep study showed AHI 132 per hr with recs for quick follow-up and Bipap  -arrange for bipap on discharge  -pt wants to know more about having his tonsils/adenoids removed  5. Diabetes: no known diagnosis, although with elevated CBGs at this time  -A1C is 9.5  -s/p 6 u Novolog 8/19 at 1330  -start SSI   6. HTN  -monitor, is not on any home meds at this time   7. Tobacco abuse: smokes ~ 2 ppd  -nicotine patch ( )   8. FEN/GI  -regular diet now that CO2 has improved and can have time off of the Bipap  9. Ppx  -subcut heparin   10. Dispo  -pending resolution of altered mental status 2/2 hypercarbia and plan to control sleep apnea   Georgina Pillion, Med Student 02/02/2012, 8:16 AM   UPPER LEVEL ADDENDUM  I have seen and examined [name] with Georgina Pillion, MS4 and I agree with the above assessment/plan. I have reviewed all available data and have made any necessary changes to the above physical, assessment, and plan.  Briefly, feels better this morning, no chest pain or SOB.  Overall feels less sleepy.  Does c/o "boil" in his left groin area that he feels like needs to be drained.  Also having significant back pain, mostly with movement, that does not radiate to his legs.  He appears to be skeptical of narcotics due to his recent respiratory failure, which may have been exacerbated by IV pain medications while in the ED the night before admission.  PE: Gen: NAD, more pleasant and calm this morning CV:  RRR Pulm: clear, distant breath sounds Abd: distended, nontender Ext: 1+ BLE pitting edema with chronic venous stasis changes of his bilateral LE; bruised area in upper left groin with fluctuance about the size of a quarter without an obvious  pustule  A/P -continue bipap at night, [place] during the day -ABGs improving with improved alertness -will give norco x1 for back pain and restart gabapentin -no need for immediate imaging of back as pt likely will not be surgical candidate with extensive lung disease and lack of radicular symptoms -will do bedside I&D of fluctuant groin area later today -if does well s/p pain medications, consider transfer to floor this afternoon if able to do qhs bipap -PT to evaluate -CM for assistance with obtaining a bipap for home  Evanie Buckle 02/02/2012, 8:53 AM

## 2012-02-03 LAB — COMPREHENSIVE METABOLIC PANEL
Alkaline Phosphatase: 73 U/L (ref 39–117)
BUN: 13 mg/dL (ref 6–23)
Creatinine, Ser: 0.98 mg/dL (ref 0.50–1.35)
GFR calc Af Amer: >90 mL/min (ref >90)
Glucose, Bld: 264 mg/dL — ABNORMAL HIGH (ref 70–99)
Potassium: 4.6 mEq/L (ref 3.5–5.1)
Total Protein: 6.9 g/dL (ref 6.0–8.3)

## 2012-02-03 LAB — CBC
HCT: 47.9 % (ref 39.0–52.0)
Hemoglobin: 16.1 g/dL (ref 13.0–17.0)
MCH: 31.8 pg (ref 26.0–34.0)
MCHC: 33.6 g/dL (ref 30.0–36.0)
MCV: 94.7 fL (ref 78.0–100.0)
RDW: 13.8 % (ref 11.5–15.5)

## 2012-02-03 MED ORDER — INSULIN GLARGINE 100 UNIT/ML ~~LOC~~ SOLN: 15.0000 [IU] | Freq: Every day | SUBCUTANEOUS | Status: DC

## 2012-02-03 MED ORDER — LIVING WELL WITH DIABETES BOOK
Freq: Once | Status: DC
  Filled 2012-02-03: qty 1

## 2012-02-03 MED ORDER — CLINDAMYCIN HCL 300 MG PO CAPS: 300.0000 mg | ORAL_CAPSULE | Freq: Three times a day (TID) | ORAL | Status: AC

## 2012-02-03 MED ORDER — BD GETTING STARTED TAKE HOME KIT: 1/2ML X 30G SYRINGES
1.0000 | Freq: Once | Status: DC
  Filled 2012-02-03: qty 1

## 2012-02-03 MED ORDER — NICOTINE 14 MG/24HR TD PT24: 1.0000 | MEDICATED_PATCH | TRANSDERMAL | Status: DC

## 2012-02-03 NOTE — Progress Notes (Signed)
Patient ID: [name], male   DOB: 1965/10/06, 46 y.o.   MRN: [id number] Family Medicine Teaching Service Daily Progress Note Service Page: [phone]  Subjective: [name] moved to a floor bed yesterday and had no acute events overnight.  He wore his Bipap overnight and wants to go home.  He is concerned about his diabetes and says he would rather give himself an injection than take a pill, because family members with diabetes have had trouble taking the pills.  He also expresses concern with following up at the family medicine center as he does not like having different doctors.  He is still concerned about getting an MRI and making sure he can take valium before he goes.  He still complains of back pain, but no CP or SOB.  Objective: Temp:  [97.9 F (36.6 C)-98.7 F (37.1 C)] 98.7 F (37.1 C) (08/22 0608) Pulse Rate:  [84-94] 84  (08/22 0608) Resp:  [18-21] 18  (08/22 3474) BP: (146-235)/(76-103) 235/76 mmHg (08/22 0608) SpO2:  [92 %-99 %] 97 % (08/22 0608) Weight:  [290 lb 5.5 oz (131.7 kg)] 290 lb 5.5 oz (131.7 kg) (08/21 2215) Exam: General: well-appearing man who is alert and able to converse normally Cardiovascular: regular rate and rhythm Respiratory: no crackles or wheezes Abdomen: nontender, nondistended Extremities: edema bilaterally as noted previously, pt says may be getting darker and are still painful for him  I have reviewed the patient's medications, labs, imaging, and diagnostic testing.  Notable results are summarized below.  CBC BMET   Lab 02/03/12 0635 02/02/12 0558 02/01/12 0505  WBC 8.3 9.0 9.4  HGB 16.1 16.0 16.2  HCT 47.9 48.8 50.1  PLT 184 162 159    Lab 02/03/12 0635 02/02/12 0558 02/01/12 0505  NA 139 139 140  K 4.6 4.1 4.6  CL 102 101 94*  CO2 28 32 33*  BUN 13 16 16   CREATININE 0.98 0.94 1.02  GLUCOSE 264* 221* 287*  CALCIUM 9.6 9.7 9.0     Imaging/Diagnostic Tests: Imaging/Diagnostic Tests:  CXR 8/19 3:11 AM  Findings: Cardiac  enlargement with mild pulmonary vascular  congestion. No edema or consolidation. No blunting of  costophrenic angles. No pneumothorax. Emphysematous changes. Old  bilateral rib fractures. No significant change since previous  study.  IMPRESSION:  Cardiac enlargement with mild pulmonary vascular congestion. No  focal consolidation or edema.  CXR 8/19 12:05 PM  Findings: Decreased lung volumes are seen. Increased diffuse  interstitial prominence seen, and acute interstitial edema cannot  be excluded. No focal consolidation identified. Heart size  remains prominent.  IMPRESSION:  Decreased lung volumes. Increased diffuse interstitial prominence;  acute interstitial edema cannot be excluded.  CXR 8/20 AM  IMPRESSION:  1. Cardiomegaly with resolution of interstitial edema.  2. Similar patchy right greater than left lower lobe predominant  atelectasis.   Plan: Assessment and Plan:  [name] is a 46 yo male with a hx of obesity, diabetes, htn, and sleep apnea who presented in hypercarbic respiratory failure. Bipap was started continually at first, then as ABG's improved pt was transitioned to just wearing Bipap overnight.  1. Hypercarbic respiratory failure: ABG in ED was 7.2/93.8/117/39.4  -most recent ABG 8/21 0827 7.34/51.4/86/28.1 -previous ABG's: (8/20 0813) 7.1/118.2/98/42, (8/20 0353) 7.2/105/112/39  -pt transitioned to floor bed evening of 8/21 -continue Bipap at night and [place] during day, sats this morning 97% -pulmonary consult signed off this morning; says pt ok to d/c with home Bipap and follow-up with PCP to  schedule Bipap titration and PFT's.  Bipap titration has been scheduled. -repeat CBC, CMP in AM--> unremarkable -continuous pulse ox -albuterol prn   2. Back pain:per patient is his primary concern as he thinks he has a ruptured disk as he fell recently and that is why he went to the [facility] ED initially. Wants an MRI but will require sedation per patient and  previous notes.  -IV Toradol 30 mg q 6 prn  -give one dose of Norco this AM since respiratory status is much improved. Will monitor and if is sleepy will put Bipap back on. Pt says at home usually takes 4 Norco tablets a day, 2 in the morning and 2 at night-->now has 1 Norco tablet q6 hrs prn ordered -will resume home gabapentin  -has follow-up scheduled with PCP to continue discussion about back pain/need for MRI  3. Lower extremity swelling: known venous stasis, has taken lasix before to decrease swelling -received 40 mg of Lasix initially in the ED  -pt says are still painful and may be getting darker in color. -will monitor   4. Severe sleep apnea: results from sleep study showed AHI 132 per hr with recs for quick follow-up and Bipap  -care coordination has arranged for Bipap on discharge; will follow-up with PCP for Bipap titration and PFT's per pulmonary recs. -pt wants to know more about having his tonsils/adenoids removed   5. Diabetes: no known diagnosis, although with elevated CBGs at this time  -A1C is 9.5  -s/p 6 u Novolog 8/19 at 1330  -diabetes coordinator suggests starting Lantus 15u daily, will discharge with this with further discussion at PCP follow-up  6. HTN  -monitor, is not on any home meds at this time  -recheck at PCP visit and start meds if appropriate  7. Groin abscess -I&D yesterday -will arrange follow-up for tomorrow to repack and monitor for infection -started Clindamycin 300 TID for 5 days because of extensive drainage  8. Tobacco abuse: smokes ~ 2 ppd  -nicotine patch ( )   9. FEN/GI  -regular diet now that CO2 has improved and can have time off of the Bipap   10. Ppx  -subcut heparin   11. Dispo  -likely today since altered mental status 2/2 hypercarbia has resolved and plan to control sleep apnea is in place      [name], Med Student 02/03/2012, 8:39 AM   UPPER LEVEL ADDENDUM  I have seen and examined [name] with  [name], MS2 and I agree with the above assessment/plan. I have reviewed all available data and have made any necessary changes to the above.  Briefly, [name] is doing very well today, sitting in a chair without O2.  States his boil in his groin feels much improved.  Ready to go home today.  PE: Gen: NAD, pleasant Pulm: scattered wheezes, overall clear  A/P: 46 yo M w/ multiple medical problems who presented with hypercabic respiratory failure, improved with Bi-PAP - d/c home today w/ bipap -5 days of clinda for groin abscess -f/u tomorrow for packing removal and check of I&D site -f/u next week with PCP to discuss back pain (pt does not really require an MRI but would really like one), DM, bipap compliance -f/u with Dr. Ethelene Hal, pt's pain mgmt MD for possible injection and refill of pain meds -start lantus for DM -outpt bi-pap titration, will send home on pulm recommended settings of 20/10  Laszlo Ellerby 02/03/2012, 12:20 PM

## 2012-02-03 NOTE — Discharge Summary (Signed)
Physician Discharge Summary  Patient ID: [name] MRN: [id number] DOB/AGE: 08-04-1965 46 y.o.  Admit date: 01/31/2012 Discharge date: 02/03/2012  Admission Diagnoses:  *hypercarbic respiratory failure *Altered mental status Obstructive sleep apnea Chronic back pain (with acute back injury) Hyperglycemia Recurrent boils-new left groin lesion arthralgia Obesity Leg edema, bilateral Venous stasis Erectile dysfunction Obesity Tobacco dependence Benign systemic hypertension  Resolved during hospital stay: hypercarbic respiratory failure Altered mental status  Discharge Diagnoses:  Obstructive sleep apnea Chronic back pain (with acute back injury) DM type II Recurrent boils-new lesion lanced arthralgia Obesity Leg edema, bilateral Venous stasis Erectile dysfunction Obesity Tobacco dependence Benign systemic hypertension   Hospital Course:  ZYSHONNE MALECHA is an 46 y.o. male with a hx of obesity, hypertension, and diabetes who presented in hypercarbic respiratory failure from the family medicine clinic on 8/19.  The patient initially presented to the [facility] ED just after midnight with back pain after a fall.  After receiving morphine and dilaudid, pt was discharged to follow-up with PCP the same day.  At this appointment the patient was found to be obtunded and hypoxic so was sent to the [facility].  He had just received a sleep apnea study on July 31 that was positive for an AHI of 132 per hour with recs to follow-up immediately for Bipap arrangement that had not happened yet.  His initial blood gas was 7.2/93.8/117/39.4.  CXR showed decreased lung volumes and increased diffuse interstitial prominence and possible acute interstitial edema. Pt was placed on Bipap.  On initial exam he was somnolent but oriented to place.  He was given 8mg  of Morphine for back pain complaint while in the ED.  Pt was admitted to the step-down unit on Bipap with repeat ABG's that  eventually improved.  Hospital course by problem as followed.  1) Hypercarbic respiratory failure: the first night the patient was resistant to wearing the Bipap and initially ABG worsened to a CO2 of 118.  Pulmonary followed the patient as well and adjusted Bipap settings as necessary. Patient began to improve and was transitioned to Bipap just at night and O2 at 3L during the day.  Final ABG was on 8/21 and was 7.34/51.4/86/28.1.  Initially he was given IV toradol for pain but as his ABG's improved he was given 1 Norco q6 prn for back pain (home regimen is 4 tablets a day) and he was closely monitored for somnolence.  Home Bipap was arranged.  Pulmonary consult mentioned that if the pt has trouble getting his Bipap covered by insurance he may need the diagnosis changed to "chronic respiratory failure" instead of just "obstructive sleep apnea."  Bipap titration has been scheduled, PFT's were also recommended by pulmonary consult and still need to be arranged.  2) Back pain: patient initially presented to the ED with this complaint after a fall.  XR showed no fracture.  He thinks he needs an MRI for a "ruptured disk."  However, he shares that he requires sedation for an MRI because he had a bad experience last time.  He was told that his respiratory status needed to be stable before this would be possible, and that an MRI may not be helpful anyway.  He would like to speak more about this at his PCP follow-up. Pt told to arrange follow-up with Dr. Ethelene Hal regarding pain medicine management.  Will not give pain meds on discharge as he has a few remaining at home.  3)Diabetes: pt had consistently elevated CBG's, and his  HbA1C was 9.5.  Diabetes coordinator saw and recommended starting 15 units of Lantus nightly.  Pt shared his concerns about taking a "pill" because his family members with diabetes have had problems with this medicine and he would rather give himself injections.  Plan is to discharge him with  regimen of 15u of Lantus to be readdressed at follow-up.   4) Left groin abscess: see procedure note on 8/21 for full details.  Was drained on 8/21 with follow-up for re-packing made for 8/23. Pt was started on 300 mg of Clindamycin TID for 5 days because of purulent drainage.  5) Htn: pt has diagnosis of htn listed but is on no medication.  In the hospital he stayed around 135/76.  Plan is to recheck at follow-up.  Could be 2/2 to back pain.  6) Lower extremity swelling: pt has known venous stasis and does not think that swelling has gotten worse, though he has noticed a color change and continued pain bilaterally.  Gabapentin was held initially but was restarted the day before discharge.     Discharge Exam: Blood pressure 135/76, pulse 89, temperature 98.4 F (36.9 C), temperature source Oral, resp. rate 20, height 5\' 6"  (1.676 m), weight 290 lb 5.5 oz (131.7 kg), SpO2 96.00%. General: well-appearing adult male CV: regular rate and rhythm Resp: no wheezes or crackles LE: swollen bilaterally with deep erythematous hue and 2+ pitting edema, painful to palpation, pulses present bilaterally.  Disposition: 01-Home or Self Care  Discharge Orders    Future Appointments: Provider: Department: Dept Phone: Center:   02/04/2012 4:00 PM Macy Mis, MD [facility] [phone] [facility]   02/09/2012 11:00 AM Everrett Coombe, DO [facility] [phone] [facility]   02/16/2012 8:00 PM [facility] Hermenia Fiscal [phone] [facility]     Medication List  As of 02/03/2012 12:02 PM   ASK your doctor about these medications         gabapentin 600 MG tablet   Commonly known as: NEURONTIN   Take 1 tablet (600 mg total) by mouth 3 (three) times daily.      HYDROcodone-acetaminophen 10-325 MG per tablet   Commonly known as: NORCO   Take 2 tablets by mouth 4 (four) times daily.           Follow-up Information    Follow up with MATTHEWS,CODY, DO on 02/09/2012. (at 11:00 to discuss back  pain, bipap)    Contact information:   [address] [place] [address] [phone]       Follow up with Delbert Harness, MD on 02/04/2012. (at 4pm for your wound only)    Contact information:   [address] [place] [address] [phone]       Follow up with Sleep lab (BiPAP titration) on 02/16/2012. (at 8pm at sleep lab)          Signed: Georgina Pillion 02/03/2012, 12:02 PM  UPPER LEVEL ADDENDUM  I have seen and examined [name] with Georgina Pillion, MS4 and I agree with the above assessment/plan. I have reviewed all available data and have made any necessary changes to the above discharge summary.  Follow up issues:  1. Pt with appt tomorrow (8/23) to f/u left groin abscess I&D and remove some packing; 5 day course of clinda 2. DM: "new" diagnosis-- started on lantus 15U, will likely need to be up titrated at f/u appt with PCP 8/28 3. Back pain: need to be careful with amount  of narcotics (likely contributed to patient's rather substantial presentation)-- pt will make f/u appt with Dr. Ethelene Hal for possible injection.  Pt also very adamant about needing MRI, although really does not need one and does require "heavy" sedation and is not a candidate at this time for that given his recent respiratory failure.  Pt also unless to be a great surgical candidate also 2/2 respiratory status and significant co morbidities  3. OSA/chronic respiratory failure: scheduled for bipap titration 9/4 in sleep lab   Antonya Leeder 02/03/2012, 1:02 PM

## 2012-02-03 NOTE — Progress Notes (Signed)
Seen and examined.  Discussed with Dr. Fara Boros.  He feels great this morning.  Seems motivated to change his lifestyle.  Plan to DC today.

## 2012-02-03 NOTE — Progress Notes (Signed)
Pt was given walker, kit for diabetes with instruction the booklet and he demonstrated  sticking himself for injections all discharge orders given  And RX with fu care

## 2012-02-03 NOTE — Evaluation (Signed)
Physical Therapy Evaluation Patient Details Name: [name] MRN: [id number] DOB: 12/15/1965 Today's Date: 02/03/2012 Time: 4098-1191 PT Time Calculation (min): 26 min  PT Assessment / Plan / Recommendation Clinical Impression  Pt. admitted with hypercarbic respiratory failure, AMS, OSA, chronic back pain  necessitating use of RW and recurrent boils.  He presents to PT mobilizing with his RW which is significantly too narrow for him.  He is to be DC'd from hospital today.  Do not believe he needs any f/u PT, but recommend  wide RW for improved safety and independence at home.  No further acute PT needs and pt. DCing this afternoon, so will sign off.    PT Assessment  Patent does not need any further PT services    Follow Up Recommendations  No PT follow up    Barriers to Discharge        Equipment Recommendations  Rolling walker with 5" wheels;Other (comment) (wide RW to accomodate his body habitus.)    Recommendations for Other Services     Frequency      Precautions / Restrictions Precautions Precautions: None Restrictions Weight Bearing Restrictions: No   Pertinent Vitals/Pain No pain currently, no distress     Mobility  Bed Mobility Bed Mobility: Supine to Sit;Sit to Supine Supine to Sit: 6: Modified independent (Device/Increase time);With rails;HOB elevated Sit to Supine: 4: Min assist;With rail Details for Bed Mobility Assistance: needed assist to bring Le's up into bed Transfers Transfers: Sit to Stand;Stand to Sit Sit to Stand: 6: Modified independent (Device/Increase time) Stand to Sit: 6: Modified independent (Device/Increase time) Details for Transfer Assistance: vc's for hand placement onto surface to rise and sit Ambulation/Gait Ambulation/Gait Assistance: 6: Modified independent (Device/Increase time) Ambulation Distance (Feet): 30 Feet Assistive device: Rolling walker Ambulation/Gait Assistance Details: Pt. at mod I level with use of RW walking to  bathroom and back.  No overt LOB noted Gait Pattern: Within Functional Limits;Trunk flexed Gait velocity: slowed Stairs: No Wheelchair Mobility Wheelchair Mobility: No    Exercises     PT Diagnosis:    PT Problem List:   PT Treatment Interventions:     PT Goals    Visit Information  Last PT Received On: 02/03/12 Assistance Needed: +1    Subjective Data  Subjective: "this is the 4th time I had this boil" Patient Stated Goal: wants to resume normal function in home and driving   Prior Functioning  Home Living Lives With: Spouse;Son;Other (Comment) (2 sons, age 27 and 47) Available Help at Discharge: Family;Available 24 hours/day Type of Home: Mobile home Home Access: Stairs to enter Entrance Stairs-Number of Steps: 2 Entrance Stairs-Rails: None Home Layout: One level Bathroom Shower/Tub: Forensic scientist: Standard Bathroom Accessibility: Yes How Accessible: Accessible via walker Home Adaptive Equipment: Walker - rolling;Straight cane Prior Function Level of Independence: Independent with assistive device(s) Able to Take Stairs?: Yes Driving: Yes Vocation: On disability Communication Communication: No difficulties Dominant Hand: Right    Cognition  Overall Cognitive Status: Appears within functional limits for tasks assessed/performed Arousal/Alertness: Awake/alert Orientation Level: Oriented X4 / Intact Behavior During Session: [facility] for tasks performed    Extremity/Trunk Assessment Right Upper Extremity Assessment RUE ROM/Strength/Tone: WFL for tasks assessed RUE Sensation: WFL - Light Touch Left Upper Extremity Assessment LUE ROM/Strength/Tone: WFL for tasks assessed LUE Sensation: WFL - Light Touch Right Lower Extremity Assessment RLE ROM/Strength/Tone: Within functional levels;Due to pain RLE Sensation: WFL - Light Touch Left Lower Extremity Assessment LLE ROM/Strength/Tone: WFL for tasks assessed  LLE Sensation: Deficits LLE  Sensation Deficits: pt. reports tingling in left LE due to prior nerve damage in 203 Trunk Assessment Trunk Assessment: Normal   Balance    End of Session PT - End of Session Equipment Utilized During Treatment: Gait belt Activity Tolerance: Patient tolerated treatment well Patient left: in chair;with call bell/phone within reach Nurse Communication: Other (comment) (need for extra wide RW)  GP     Ferman Hamming 02/03/2012, 2:46 PM Weldon Picking PT Acute Rehab Services [phone] Beeper [phone]

## 2012-02-03 NOTE — Progress Notes (Signed)
Name: [name] MRN: [id number] DOB: 1966/05/05    LOS: 3  [place] Pulmonary / Critical Care Note   History of Present Illness: 46 y/o M with PMH of Morbid Obesity, OSA, left pneumothorax, and chronic back pain who was seen in ER on 8/18 with a 2 day hx of back pain and shortness of breath.  Followed up with PCP 8/19 and was noted to be hypoxic with saturations in 70's -80's on 2L.  Was transferred via EMS to ER on 8/19 - noted to drift off to sleep during conversation, placed on 50% O2 with improvement in saturations.  Noted recent fall with difficulty walking and radicular pain.  ABG eval demonstrated ph 7.23 / pCO2 93 / pO2 117 / HCO3 39, CXR with no acute infiltrate.  Placed on bipap, admitted per IM TS & PCCM consulted for decompensated OHS/OSA.    Tests / Events: 01/12/12 Sleep Study>>>AHI 132, SpO2 low 58%  Subjective:  Did well overnight with BPAP.  Denies cough, dyspnea, wheeze, sputum, chest pain.  Vital Signs: Temp:  [97.9 F (36.6 C)-98.7 F (37.1 C)] 98.7 F (37.1 C) (08/22 0608) Pulse Rate:  [84-94] 84  (08/22 0608) Resp:  [18-21] 18  (08/22 0608) BP: (146-235)/(76-103) 235/76 mmHg (08/22 0608) SpO2:  [92 %-99 %] 97 % (08/22 0608) Weight:  [290 lb 5.5 oz (131.7 kg)] 290 lb 5.5 oz (131.7 kg) (08/21 2215) I/O last 3 completed shifts: In: 1430 [P.O.:1430] Out: -   Physical Examination: General: morbidly obese, awake Neuro: much more awake, interacting normally, moves all ext CV: s1s2 rrr PULM: resp's even/non-labored, lungs bilaterally diminished GI: abd obese / soft, bsx4 active Extremities: warm/dry  Labs    CBC  Lab 02/03/12 0635 02/02/12 0558 02/01/12 0505  HGB 16.1 16.0 16.2  HCT 47.9 48.8 50.1  WBC 8.3 9.0 9.4  PLT 184 162 159   BMET  Lab 02/02/12 0558 02/01/12 0505 01/31/12 1556 01/31/12 1220 01/31/12 0455  NA 139 140 -- 137 133*  K 4.1 4.6 -- -- --  CL 101 94* -- 96 93*  CO2 32 33* -- 37* 35*  GLUCOSE 221* 287* -- 306* 276*  BUN 16 16 --  15 14  CREATININE 0.94 1.02 0.86 0.85 0.89  CALCIUM 9.7 9.0 -- 9.0 8.9  MG -- -- -- -- --  PHOS -- -- -- -- --    Lab 02/02/12 0827 02/01/12 1651 02/01/12 1204 02/01/12 0813 02/01/12 0353  PHART 7.341* 7.464* 7.262* 7.169* 7.201*  PCO2ART 51.4* 46.8* 88.5* 118.2* 105.0*  PO2ART 86.0 126.0* 106.0* 98.0 112.0*  HCO3 28.1* 33.5* 39.9* 42.8* 39.7*  TCO2 30 35 43 46 42.9  O2SAT 96.0 99.0 97.0 94.0 98.0      Assessment and Plan:  A: Acute on chronic hypercarbic respiratory failure 2nd to OSA/OHS, and hx of tobacco use with ?obstructive lung disease exacerbated by recent pain medicine use. P: -Continue BPAP 20/10 qhs>>will need to have in lab BPAP titration study as outpt to determine optimal settings in more stable state -Needs smoking cessation, continue nicotine replacement tx -Will need outpt PFT"s to assess for obstructive lung disease -Try to limit narcotics as tolerated  Okay for d/c home from pulmonary standpoint.  Can f/u with his PCP to arrange for BPAP titration and PFT's.  Can then have referral to pulmonary as outpt if needed.  PCCM will sign off.  Please call if additional help needed.  Coralyn Helling, MD [facility] Pulmonary/Critical Care 02/03/2012, 7:43 AM Pager:  [phone]  After 3pm call: [phone]

## 2012-02-03 NOTE — Progress Notes (Signed)
Patient with newly diagnosed diabetes.  A1c 9.5% (01/31/12).  Spoke with pt about new diagnosis.  Discussed A1C results with him and explained what an A1C is, basic pathophysiology of DM Type 2, basic home care, importance of checking CBGs and maintaining good CBG control to prevent long-term and short-term complications.  Reviewed signs and symptoms of hyperglycemia and hypoglycemia.  RNs to provide ongoing basic DM education at bedside with this patient.  Have ordered educational booklet, insulin starter kit.  RN Lissa Hoard to show patient how to draw up and administer insulin via vial & syringe method.  Explained to patient how to check CBGs at home.  Asked patient to check a fasting sugar everyday and to check a 2nd time during the day as well.  Asked patient to please record all CBGs and take his record to all doctor appts.  Will follow. Ambrose Finland RN, MSN, CDE Diabetes Coordinator Inpatient Diabetes Program [phone]

## 2012-02-04 ENCOUNTER — Ambulatory Visit: Payer: Medicare Other | Admitting: Family Medicine

## 2012-02-04 NOTE — Discharge Summary (Signed)
Seen and examined on the day of discharge.  Agree with documentation and management as outlined by Dr. Fara Boros

## 2012-02-07 ENCOUNTER — Telehealth: Payer: Self-pay | Admitting: *Deleted

## 2012-02-07 ENCOUNTER — Other Ambulatory Visit: Payer: Self-pay | Admitting: Family Medicine

## 2012-02-07 MED ORDER — BLOOD GLUCOSE METER KIT: PACK | Status: AC

## 2012-02-07 MED ORDER — "INSULIN SYRINGE-NEEDLE U-100 29G X 7/16"" 0.3 ML MISC": Status: DC

## 2012-02-07 MED ORDER — LANCETS MISC: Status: DC

## 2012-02-07 MED ORDER — NICOTINE 14 MG/24HR TD PT24: 1.0000 | MEDICATED_PATCH | TRANSDERMAL | Status: AC

## 2012-02-07 NOTE — Telephone Encounter (Signed)
Pharmacy calling . States they received e-script for One Touch Ultra Glucose machine, test strips and lancets.  Under the new Medicare Part B, this has to be hand written on a script with specific testing instructions and diagnosis code.  It can not be e-prescribed. Please fax to [facility] [phone].  Will route to Dr. Ashley Royalty to do.  Ileana Ladd

## 2012-02-07 NOTE — Addendum Note (Signed)
Addended by: Everrett Coombe on: 02/07/2012 12:23 PM   Modules accepted: Orders

## 2012-02-09 ENCOUNTER — Ambulatory Visit (INDEPENDENT_AMBULATORY_CARE_PROVIDER_SITE_OTHER): Payer: Medicare Other | Admitting: Family Medicine

## 2012-02-09 ENCOUNTER — Inpatient Hospital Stay: Payer: Medicare Other | Admitting: Family Medicine

## 2012-02-09 ENCOUNTER — Encounter: Payer: Self-pay | Admitting: Family Medicine

## 2012-02-09 VITALS — BP 144/93 | HR 94 | Ht 68.0 in | Wt 282.0 lb

## 2012-02-09 DIAGNOSIS — IMO0002 Reserved for concepts with insufficient information to code with codable children: Secondary | ICD-10-CM

## 2012-02-09 DIAGNOSIS — E119 Type 2 diabetes mellitus without complications: Secondary | ICD-10-CM

## 2012-02-09 DIAGNOSIS — E669 Obesity, unspecified: Secondary | ICD-10-CM

## 2012-02-09 DIAGNOSIS — G4733 Obstructive sleep apnea (adult) (pediatric): Secondary | ICD-10-CM

## 2012-02-09 MED ORDER — FUROSEMIDE 20 MG PO TABS: 20.0000 mg | ORAL_TABLET | Freq: Every day | ORAL | Status: DC

## 2012-02-09 MED ORDER — HYDROCODONE-ACETAMINOPHEN 7.5-325 MG PO TABS: 1.0000 | ORAL_TABLET | Freq: Four times a day (QID) | ORAL | Status: AC | PRN

## 2012-02-09 MED ORDER — ALBUTEROL SULFATE HFA 108 (90 BASE) MCG/ACT IN AERS: 2.0000 | INHALATION_SPRAY | Freq: Four times a day (QID) | RESPIRATORY_TRACT | Status: AC | PRN

## 2012-02-09 NOTE — Patient Instructions (Addendum)
Thank you for coming in today, it was good to see you I am glad to see that your breathing is doing better. I talked to [facility] and your prescriptions for your diabetes are ready to pick up. I will see you back in one month to be sure you are still doing ok. I have given you enough pain medication to last you one month.

## 2012-02-09 NOTE — Telephone Encounter (Signed)
Done

## 2012-02-15 NOTE — Assessment & Plan Note (Signed)
Compliant with lantus, unable to check glucose.  Called pharmacy and meter and meds are waiting for him.

## 2012-02-15 NOTE — Progress Notes (Signed)
  Subjective:    Patient ID: [name], male    DOB: [birth year], 46 y.o.   MRN: [id number]  HPI  1. HFU:  Here for HFU after admission 2/2 to [facility] and lethargy due to OSA/OHS.  Precipitating factor thought to be narcotic administration on already chronic respiratory failure.  He did have sleep study a few days prior to admission ordered by pain management center, however CPAP was not titrated at that time.  SInce his hospital admission bipap has been arranged, however he is still awaiting second sleep study for final settings.  He states he drowsiness has improved significantly since being in the hospital.  He has not smoked since discharge from hospital.  He denies daytime sleepiness and is typically sleeping 7.5 hours per night.  He denies shortness of breath,chest pain, headache.  2. Low back/leg pain:  Has previous history of radicular leg pain.  Pain still present, was followed by Dr. Ethelene Hal previously and on norco 10/325mg  and gabapentin.  Currently has not pain medications and states he has constant pain into legs, to the point that he has difficulty walking.  He denies any bowel or bladder incontinence  3.  Diabetes:  Has recent diagnosis of diabetes, started on lantus in hospital.  He has had some lantus that he was sent home with.  He has had difficulty obtaining his prescriptions since discharge.  He has not gotten a meter yet so he has not been able to check his sugar.  He denies any feelings of low blood sugar or increased thirst or urination  Review of Systems Per HPI    Objective:   Physical Exam  Constitutional:       Obese male, nad   Cardiovascular: Normal rate and regular rhythm.   Pulmonary/Chest: Effort normal and breath sounds normal. No respiratory distress. He has no wheezes.  Musculoskeletal: He exhibits no edema.  Neurological: He is alert.          Assessment & Plan:

## 2012-02-15 NOTE — Assessment & Plan Note (Signed)
Improved now with home bipap.  He has also not smoked since dc from hospital.  Plans to go for repeat sleep study for titration of bipap.

## 2012-02-15 NOTE — Assessment & Plan Note (Signed)
He is in the process of getting established with pain management.  Will give him rx x1 month until he gets established with them.  However given recent events with hypercarbia, will decrease narcotic pain medication from 10mg  to 7.5 mg of hydrocodone.  Patient agrees to this.

## 2012-02-16 ENCOUNTER — Encounter (HOSPITAL_BASED_OUTPATIENT_CLINIC_OR_DEPARTMENT_OTHER): Payer: Medicare Other

## 2012-02-21 ENCOUNTER — Encounter (HOSPITAL_BASED_OUTPATIENT_CLINIC_OR_DEPARTMENT_OTHER): Payer: Medicare Other

## 2012-03-12 ENCOUNTER — Encounter (HOSPITAL_BASED_OUTPATIENT_CLINIC_OR_DEPARTMENT_OTHER): Payer: Medicare Other

## 2012-03-26 ENCOUNTER — Ambulatory Visit (HOSPITAL_BASED_OUTPATIENT_CLINIC_OR_DEPARTMENT_OTHER): Payer: Medicare Other

## 2012-04-14 ENCOUNTER — Ambulatory Visit: Payer: Medicare Other | Admitting: Family Medicine

## 2012-04-24 ENCOUNTER — Encounter: Payer: Self-pay | Admitting: Home Health Services

## 2012-04-24 ENCOUNTER — Ambulatory Visit (INDEPENDENT_AMBULATORY_CARE_PROVIDER_SITE_OTHER): Payer: Medicare Other | Admitting: Family Medicine

## 2012-04-24 ENCOUNTER — Encounter: Payer: Self-pay | Admitting: Family Medicine

## 2012-04-24 VITALS — BP 128/87 | HR 92 | Temp 98.3°F | Ht 68.0 in | Wt 289.0 lb

## 2012-04-24 DIAGNOSIS — Z789 Other specified health status: Secondary | ICD-10-CM

## 2012-04-24 DIAGNOSIS — E119 Type 2 diabetes mellitus without complications: Secondary | ICD-10-CM

## 2012-04-24 DIAGNOSIS — L918 Other hypertrophic disorders of the skin: Secondary | ICD-10-CM

## 2012-04-24 DIAGNOSIS — L909 Atrophic disorder of skin, unspecified: Secondary | ICD-10-CM

## 2012-04-24 DIAGNOSIS — IMO0002 Reserved for concepts with insufficient information to code with codable children: Secondary | ICD-10-CM

## 2012-04-24 DIAGNOSIS — F17200 Nicotine dependence, unspecified, uncomplicated: Secondary | ICD-10-CM

## 2012-04-24 DIAGNOSIS — Z9189 Other specified personal risk factors, not elsewhere classified: Secondary | ICD-10-CM

## 2012-04-24 MED ORDER — METFORMIN HCL 500 MG PO TABS: 500.0000 mg | ORAL_TABLET | Freq: Two times a day (BID) | ORAL | Status: DC

## 2012-04-24 MED ORDER — INSULIN GLARGINE 100 UNIT/ML ~~LOC~~ SOLN: 15.0000 [IU] | Freq: Every day | SUBCUTANEOUS | Status: DC

## 2012-04-24 MED ORDER — HYDROCODONE-ACETAMINOPHEN 10-325 MG PO TABS: 1.0000 | ORAL_TABLET | Freq: Four times a day (QID) | ORAL | Status: DC | PRN

## 2012-04-24 NOTE — Patient Instructions (Addendum)
Thank you for coming in today, it was good to see you Please increase your lantus to 25 units daily Start the metformin Check your sugar after each meal and record, return in 2 weeks for follow up

## 2012-04-25 ENCOUNTER — Encounter: Payer: Self-pay | Admitting: Home Health Services

## 2012-04-25 DIAGNOSIS — L918 Other hypertrophic disorders of the skin: Secondary | ICD-10-CM | POA: Insufficient documentation

## 2012-04-25 LAB — COMPREHENSIVE METABOLIC PANEL
Albumin: 4.1 g/dL (ref 3.5–5.2)
BUN: 14 mg/dL (ref 6–23)
CO2: 31 mEq/L (ref 19–32)
Calcium: 9.8 mg/dL (ref 8.4–10.5)
Chloride: 96 mEq/L (ref 96–112)
Glucose, Bld: 188 mg/dL — ABNORMAL HIGH (ref 70–99)
Potassium: 4.5 mEq/L (ref 3.5–5.3)
Sodium: 135 mEq/L (ref 135–145)
Total Protein: 6.7 g/dL (ref 6.0–8.3)

## 2012-04-25 NOTE — Assessment & Plan Note (Signed)
Multiple skin tags on upper eyelids will refer to ophthalmology as he also needs diabetic eye exam.

## 2012-04-25 NOTE — Assessment & Plan Note (Signed)
Poorly controlled, A1c 9.2 today.  His glucose is still quite high, will have him increase lantus to 25 units.  Will also add metformin.   Instructed to check post prandial and fasting glucose and record for [place] to review when he follows up in two weeks.  Will refer to diabetes education.

## 2012-04-25 NOTE — Assessment & Plan Note (Signed)
Congratulated on quitting smoking.   

## 2012-04-25 NOTE — Assessment & Plan Note (Signed)
Difficulty getting established with pain management.  Would like referral to a different pain management center.  I refilled his hydrocodone but explained that this would be the last time that I would do so.

## 2012-04-25 NOTE — Progress Notes (Signed)
  Subjective:    Patient ID: [name], male    DOB: 1966/03/07, 46 y.o.   MRN: [id number]  HPI 1. DM: CHRONIC DIABETES  Disease Monitoring  Blood Sugar Ranges: 250-450, fasting.  Does not often check post prandials  Polyuria: no   Visual problems: no   Medication Compliance: yes  Medication Side Effects  Hypoglycemia: no   Preventitive Health Care  Eye Exam: None recent  Diet pattern: Generally poor  Exercise: minimal, deconditioned.    2. Chronic pain:  Chronic Pain Follow-Up Assessment Chronic Pain Diagnosis: Low back pain Pain scale rating at its BEST in the past 24 hours (1 to 10): 5 Pain scale rating at its WORST in the past 24 hours (1 to 10): 9 Adverse Effects:  (None_x_ Nausea__ Vomiting__ Confusion__ Sleepiness__ Fatigue__ Constipation__ Other__) Treatment of Adverse Effects: n/a Since the last clinic visit, how much relief have pain treatment and medication provided? Please circle the one percentage that shows how much relief you have received: 0%__ 10%__ 20%__ 30%__ 40%__ 50%_x_ 60%__ 70%__ 80%__ 90%__ 100%__ Loraine Leriche the one number that describes how, during the past 24 hours, pain has interfered with your: General Activity 0__ 1__ 2__ 3__ 4__ 5_x_ 6__ 7__ 8__ 9__ 10__ Does not interfere      Completely interferes Mood 0__ 1__ 2__ 3__ 4__ 5__ 6_x_ 7__ 8__ 9__ 10__ Does not interfere      Completely interferes Ability to work (in or out of the home) 0__ 1__ 2__ 3__ 4__ 5_x_ 6__ 7__ 8__ 9__ 10__ Does not interfere      Completely interferes Interactions with other people 0__ 1__ 2__ 3__ 4_x_ 5__ 6__ 7__ 8__ 9__ 10__ Does not interfere      Completely interferes Sleep 0__ 1__ 2__ 3_x_ 4__ 5__ 6__ 7__ 8__ 9__ 10__ Does not interfere      Completely interferes  Enjoyment of life 0__ 1__ 2__ 3__ 4_x_ 5__ 6__ 7__ 8__ 9__ 10__ Does not interfere      Completely interferes  Has been referred to pain management but feels like he is having to "jump through too  many hoops" just to be seen.  He has completed everything they have asked for and he just keeps incurring costs of different tests without getting anything for pain control.  Would like a referral to a different pain management group, [facility].    3. Skin tags:  Has skin tags on eyelids.  Become irritated quite often.  Would like to have them removed.  No swelling or redness currently.    4. Tobacco dependence:  He has quit smoking since his hospitalization.  He does not plan to restart.   Review of Systems Per HPI    Objective:   Physical Exam  Constitutional:       Obese male, nad  HENT:  Head: Normocephalic and atraumatic.  Musculoskeletal: He exhibits edema (trace).       Venous stasis changes bilaterally   Neurological: He is alert.  Skin:       Mutiple skin tags on upper eyelids.          Assessment & Plan:

## 2012-04-26 ENCOUNTER — Telehealth: Payer: Self-pay | Admitting: *Deleted

## 2012-04-26 NOTE — Telephone Encounter (Signed)
Called and informed patient of appointment with Dr Nile Riggs on 05/18/12 at 8:30 am. Also told him I faxed the referral to [facility] and they will contact him with that appointment.Grizel Vesely, Rodena Medin

## 2012-05-23 ENCOUNTER — Encounter: Payer: Medicare Other | Attending: Family Medicine | Admitting: Dietician

## 2012-05-23 ENCOUNTER — Encounter: Payer: Self-pay | Admitting: Dietician

## 2012-05-23 VITALS — Ht 68.0 in | Wt 287.8 lb

## 2012-05-23 DIAGNOSIS — IMO0001 Reserved for inherently not codable concepts without codable children: Secondary | ICD-10-CM | POA: Insufficient documentation

## 2012-05-23 DIAGNOSIS — Z713 Dietary counseling and surveillance: Secondary | ICD-10-CM | POA: Insufficient documentation

## 2012-05-23 DIAGNOSIS — E1165 Type 2 diabetes mellitus with hyperglycemia: Secondary | ICD-10-CM

## 2012-05-23 NOTE — Progress Notes (Signed)
Medical Nutrition Therapy:  Appt start time: 1130 end time:  1300.   Assessment:  Primary concerns today: Can't get blood sugar down. Has a strong family history for diabetes and is anxious to get the glucose "under control so I don't have the problems that all of my mom and her family has."  New onset DM type 2 diagnosed with hospitalization in August 2013.  A1C at that time was 9.2%.   Comes with a history of 3 back surgeries that has left him with pain, a limp and using a cane for walking.  He currently is on disability and finding it hard to live on this limited income.  Some of his medications, he is not taking due to the co-pay.  He has done his shopping for a new health plan.  He will be receiving [employer] the first of next year.    GOAL:  Lose weight and not need as much medication to be healthy.  HYPERGLYCEMIA:  Notes that he has the hunger, thirst, the blurry vision, increased urination.  HYPOGLYCEMIA: Denies any current symptoms of low blood glucose  BLOOD GLUCOSE MONITORING:  Fasting: 140- mostly 200's  1 hour after the meal:  300's, 326, sometimes as high as 479  HS: 200's -400  DAILY FOOT SELF-EXAM: Notes his wife daily examines his feet and applies lotion.  DILATED EYE EXAM:  C/O blurred vision.  Has not yet had the dilated eye exam.   MEDICATIONS: Completed Med review.  Diabetes meds include:Novolin N insulin and Metformin   DIETARY INTAKE: 24-hr recall:  B ( AM): 7-9:00 AM coffee (black, caffinated)  and 2-4 poached eggs with a little salt and pepper  Snk ( AM): none  L ( PM): can of tuna and crackers (roasted vegetable crackers) or pita chips. Snk ( PM): None D ( PM): 4:00-6:00  Chicken or fish with broccoli, green beans, spinach, butter beans.  Vaughn Frieze do a stir fry, meat and veggies such as; carrots, broccoli, peas, onions, soy sauce, coconut oil.  Baked not frying,  Snk ( PM): PB or cheese. Beverages: coffee, water with lemon and green tea with lemon and honey.  Usual  physical activity: Currently has no regular exercise regimen.  Will have the [employer] plan that includes a [employer] beginning the first of 2014.  Plans to use the pool for walking or water aerobics classes.  In the mean while, encouraged him to try to twice daily do upper body exercises with light weights while sitting in a chair.  Plan to work up to 20-30 reps.  Estimated energy needs:  Ht: 68 in  WT: 287.8 lb  BMI: 43.9 kg/m2   Adj WT:  205 lb (93 kg) 1700-1800 calories 195-200 g carbohydrates 130-135 g protein 46-48 g fat  Progress Towards Goal(s):  In progress.   Nutritional Diagnosis:  Lamont-2.1 Inpaired nutrition utilization As related to blood glucose.  As evidenced by new diagnosis of type2 diabetes with A1C of 9.2% and the use of NPH insulin for blood glucose control.    Intervention:  Nutrition Currently his caloric intake is somewhere around 1200-1300 calories most likely.  He is using honey and advised him that the honey and table sugar are the same amount of carb.  Advised to try Stevia.  Advised to not skip meals.  Recommended that he have som carb and protein at all meals and snacks.  Continue to read food labels and monitor portion sizes.  Continue to have an increased intake  of water each day.  Continue to not fry and have a high fat intake in his diet.  Handouts given during visit include:  Living Well with Diabetes  Controlling Blood Glucose  Novo Nordisk Carb Counting Guide  Sample menu with 20 gm of carb per meal  Snack list for ideas  Blood glucose log  Monitoring/Evaluation:  Dietary intake, exercise, blood glucose levesl, and body weight 8-12 weeks.Marland Kitchen

## 2012-05-23 NOTE — Patient Instructions (Addendum)
   Try to have protein and some carb at lunch.  Aim for 15-30 gm of carb.  Have some form of carb for breakfast  (1 slice of whole wheat sugar at 0-9 gm or less and fiber at 2-3 gm per slice.  Stevia for sweetening products.  When using carb at the evening meal stay more with the beans, peas, lentils (they protein, fiber and starch/carb)  Generally try to have 15-30 gm of carb at your meal.     When hungry, have a spoonful of PB  Exercise using the hand weights for 5 reps twice daily and work up to 20 reps per day.  Aim for 20 lb weight loss.

## 2012-05-24 ENCOUNTER — Other Ambulatory Visit: Payer: Self-pay | Admitting: Family Medicine

## 2012-07-04 ENCOUNTER — Ambulatory Visit (INDEPENDENT_AMBULATORY_CARE_PROVIDER_SITE_OTHER): Payer: Medicare Other | Admitting: Family Medicine

## 2012-07-04 ENCOUNTER — Encounter: Payer: Self-pay | Admitting: Family Medicine

## 2012-07-04 VITALS — BP 128/73 | HR 105 | Ht 68.0 in | Wt 293.5 lb

## 2012-07-04 DIAGNOSIS — B353 Tinea pedis: Secondary | ICD-10-CM

## 2012-07-04 DIAGNOSIS — G4733 Obstructive sleep apnea (adult) (pediatric): Secondary | ICD-10-CM

## 2012-07-04 MED ORDER — TERBINAFINE HCL 250 MG PO TABS: 250.0000 mg | ORAL_TABLET | Freq: Every day | ORAL | Status: DC

## 2012-07-04 NOTE — Patient Instructions (Addendum)
You should contact [facility] with the problem with your CPAP machine. I have sent in a prescription for lamisil, if this is too expensive let me know. Follow up with me in 6-8 weeks.

## 2012-07-09 DIAGNOSIS — B353 Tinea pedis: Secondary | ICD-10-CM | POA: Insufficient documentation

## 2012-07-09 NOTE — Assessment & Plan Note (Signed)
I'm not sure what needs to be done about mask falling off while sleeping.  He may need an alternative size. Advised to contact [facility] first and if can not resolve problem to contact me and we can set him up for a titration study to see if he needs a larger mask.

## 2012-07-09 NOTE — Assessment & Plan Note (Signed)
Appears to have tinea of bilateral feet.  Will treat with lamisil x12 weeks given nail involvement.  Advised to replace flip flops to prevent reinfection

## 2012-07-09 NOTE — Progress Notes (Signed)
  Subjective:    Patient ID: [name], male    DOB: Aug 24, 1965, 47 y.o.   MRN: [id number]  HPI  1. CPAP problem: States that he is having some trouble with his CPAP machine, that his mask keeps falling off at night.  Has tried tightening mask but he finds himself rolling over and  Dislodging mask.  Feels ok when mask is in place. Has not contacted [facility] yet.  2. Foot itching:  Increased foot itching and burning over the past month.  Has tried multiple OTC remedies and has had only minimal improvement.  Is worried if he scratches too much he can get an infection.  Cracking of feet has improved.    Review of Systems Per HPI    Objective:   Physical Exam  Constitutional:       Morbidly obese, nad   HENT:  Head: Normocephalic and atraumatic.       Full beard   Skin:       Feet are dry with some cracking along the heel and thickened skin and nails.  Venous stasis changes present.  Hypertrophic toenails.            Assessment & Plan:

## 2012-08-29 ENCOUNTER — Encounter: Payer: Self-pay | Admitting: Home Health Services

## 2012-09-04 ENCOUNTER — Telehealth: Payer: Self-pay | Admitting: Family Medicine

## 2012-09-04 NOTE — Telephone Encounter (Signed)
Will fwd. To PCP for refills. .Lucion Dilger  

## 2012-09-04 NOTE — Telephone Encounter (Signed)
Pt is asking for refill on his Lamisil and triamcinolone cream (KENALOG) 0.1 %   [facility] [address]

## 2012-09-06 MED ORDER — TRIAMCINOLONE ACETONIDE 0.1 % EX CREA: TOPICAL_CREAM | Freq: Two times a day (BID) | CUTANEOUS | Status: DC

## 2012-09-06 NOTE — Telephone Encounter (Signed)
Patient is calling back to check the status of the request for his refill.

## 2012-09-06 NOTE — Telephone Encounter (Signed)
Rx for lamisil for 84 tablets which should cover the 12 week course.  Will send in rx for triamcinolone

## 2012-09-07 NOTE — Telephone Encounter (Signed)
Left message informing patient that Rx was sent to pharmacy.[place], Rodena Medin

## 2012-09-09 ENCOUNTER — Other Ambulatory Visit: Payer: Self-pay | Admitting: Family Medicine

## 2012-10-06 ENCOUNTER — Telehealth: Payer: Self-pay | Admitting: Family Medicine

## 2012-10-06 NOTE — Telephone Encounter (Signed)
Will not refill further, needs follow up. Completed 12 week course.  Instructed to follow up half way through treatment but did not.  Will not fill for other family members, they will need to be seen.   [facility] from 1/21 "I have sent in a prescription for lamisil, if this is too expensive let me know.  Follow up with me in 6-8 weeks."

## 2012-10-06 NOTE — Telephone Encounter (Signed)
Will fwd. To Dr.Matthews for review and to address. [name], [name]

## 2012-10-06 NOTE — Telephone Encounter (Signed)
Patient is calling because he would like a refill on the Lamisil.  He thinks that he and his son and his wife are passing the [employer] back and forth.  The pils and the cream are helping, they have bleached the tubs and showers, thrown away socks and shoes but he thinks that if everyone in his house can be on the pills and the creams at the same time, they might be able to get this under control to get rid of it for good. [facility] [address]

## 2012-10-06 NOTE — Telephone Encounter (Signed)
Called pt and I did explain several times Dr.Matthews message. Pt is VERY UPSET and he yelled on the phone 'I am getting tired of this. I do not want treatment for the family. I did not say that I want that. I just need a new Rx for the Lamisil'. Pt also asked, if this conversation is being recorded and I said 'no'. Again he started yelling and demanded to speak with Dr.Matthews. I told him, that I would let Dr.M. Know, but he is not in clinic and I can only read Dr. Judie Petit. Response to his request.  I had to hang up the phone, because pt did not stop yelling and repeated the same thing again. Fwd. To Dr.M.  .Arlyss Repress

## 2012-12-30 ENCOUNTER — Ambulatory Visit (INDEPENDENT_AMBULATORY_CARE_PROVIDER_SITE_OTHER): Payer: Medicare Other | Admitting: Family Medicine

## 2012-12-30 VITALS — BP 122/90 | HR 92 | Temp 98.6°F | Resp 20 | Ht 66.0 in | Wt 282.0 lb

## 2012-12-30 DIAGNOSIS — H547 Unspecified visual loss: Secondary | ICD-10-CM

## 2012-12-30 DIAGNOSIS — H109 Unspecified conjunctivitis: Secondary | ICD-10-CM

## 2012-12-30 DIAGNOSIS — S0590XA Unspecified injury of unspecified eye and orbit, initial encounter: Secondary | ICD-10-CM

## 2012-12-30 DIAGNOSIS — S058X2A Other injuries of left eye and orbit, initial encounter: Secondary | ICD-10-CM

## 2012-12-30 MED ORDER — ERYTHROMYCIN 5 MG/GM OP OINT: TOPICAL_OINTMENT | Freq: Four times a day (QID) | OPHTHALMIC | Status: DC

## 2012-12-30 NOTE — Progress Notes (Signed)
[facility]:  Office Visit  Chief Complaint:  Chief Complaint  Patient presents with  . Foreign Body in Eye    ? Lt eye has been draining all day- painful- sensitive to light    HPI: [name] is a 47 y.o. male who complains of  Left eye pain and irritation this AM. He has pain and also some light sensitivity. 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 He rubbed his eyes. Just woke up with it. It is draining clear liquid but it hurts and feels irritated on the outer edge of his left eye. He has diabetes, sees Dr. Marcelle Overlie yearly for his eye care. His last HbA1c that we have on record is from 04/2012 and was 9.2. No facial pain, HA, double vision, n/v/CP. No pain with eye movement, it is just in one spot.   Past Medical History  Diagnosis Date  . Neuropathy   . Obesity   . Collapsed lung     Left side  . OSA (obstructive sleep apnea)   . Diabetes mellitus   . No pertinent past medical history   . Pain in back   . Environmental allergies   . Arthritis   . Seizures    Past Surgical History  Procedure Laterality Date  . Back surgery    . Appendectomy    . Eye surgery    . Fracture surgery    . Incision and drainage  02/02/2012        History   Social History  . Marital Status: Married    Spouse Name: N/A    Number of Children: N/A  . Years of Education: N/A   Social History Main Topics  . Smoking status: Former Smoker -- 1.50 packs/day for 32 years    Types: Cigarettes    Quit date: 01/30/2012  . Smokeless tobacco: Never Used  . Alcohol Use: Yes     Comment: once a month  . Drug Use: No  . Sexually Active: None   Other Topics Concern  . None   Social History Narrative  . None   Family History  Problem Relation Age of Onset  . Diabetes Mother   . Asthma Mother   . COPD Mother   . Obesity Mother   . Diabetes Maternal Aunt   . Asthma Maternal Aunt   . Obesity Maternal Aunt   . Diabetes Maternal Uncle   .  Asthma Maternal Uncle   . Obesity Maternal Uncle   . Diabetes Maternal Grandmother   . Asthma Maternal Grandmother   . Obesity Maternal Grandmother   . Diabetes Maternal Grandfather   . Asthma Maternal Grandfather   . Obesity Maternal Grandfather    Allergies  Allergen Reactions  . Penicillins     REACTION: Anaphylaxis   Prior to Admission medications   Medication Sig Start Date End Date Taking? Authorizing Provider  Blood Glucose Monitoring Suppl (BLOOD GLUCOSE METER) kit Please provide glucometer for patient to use.  Testing frequency should be qam. 02/07/12 02/06/13 Yes Everrett Coombe, DO  gabapentin (NEURONTIN) 600 MG tablet Take 1 tablet (600 mg total) by mouth 3 (three) times daily. 12/01/11  Yes Everrett Coombe, DO  HYDROcodone-acetaminophen (NORCO) 10-325 MG per tablet Take 1 tablet by mouth every 6 (six) hours as needed for pain. 04/24/12  Yes Everrett Coombe, DO  insulin glargine (LANTUS) 100 UNIT/ML injection Inject 15 Units into the skin daily. 04/24/12 04/24/13 Yes  Everrett Coombe, DO  Insulin Syringe-Needle U-100 29G X 7/16" 0.3 ML MISC Use as directed for insulin injection 02/07/12  Yes Everrett Coombe, DO  Lancets MISC Please provide lancets to use with glucometer.  Testing frequency qam. 02/07/12  Yes Everrett Coombe, DO  albuterol (PROVENTIL HFA;VENTOLIN HFA) 108 (90 BASE) MCG/ACT inhaler Inhale 2 puffs into the lungs every 6 (six) hours as needed for wheezing. 02/09/12 02/08/13  Everrett Coombe, DO  furosemide (LASIX) 20 MG tablet TAKE 1 TABLET (20 MG TOTAL) BY MOUTH DAILY. 05/24/12   Everrett Coombe, DO  insulin NPH (HUMULIN N,NOVOLIN N) 100 UNIT/ML injection Inject 30 Units into the skin daily before breakfast.    Historical Provider, MD  metFORMIN (GLUCOPHAGE) 500 MG tablet Take 1 tablet (500 mg total) by mouth 2 (two) times daily with a meal. 04/24/12   Everrett Coombe, DO  terbinafine (LAMISIL) 250 MG tablet Take 1 tablet (250 mg total) by mouth daily. For 12 weeks 07/04/12   Everrett Coombe,  DO  triamcinolone cream (KENALOG) 0.1 % Apply topically 2 (two) times daily. Apply to lower legs 09/06/12 09/06/13  Everrett Coombe, DO     ROS: The patient denies fevers, chills, night sweats, unintentional weight loss, chest pain, palpitations, wheezing, dyspnea on exertion, nausea, vomiting, abdominal pain, dysuria, hematuria, melena, + chronic numbness, or tingling.   All other systems have been reviewed and were otherwise negative with the exception of those mentioned in the HPI and as above.    PHYSICAL EXAM: Filed Vitals:   12/30/12 1819  BP: 122/90  Pulse: 92  Temp: 98.6 F (37 C)  Resp: 20   Filed Vitals:   12/30/12 1819  Height: 5\' 6"  (1.676 m)  Weight: 282 lb (127.914 kg)   Body mass index is 45.54 kg/(m^2).  General: Alert, no acute distress HEENT:  Normocephalic, atraumatic, oropharynx patent. + gelationous area of lateral left sclera, detached appearing , + erytheamtous conjunctiva. + fluoroscein uptake at center of iris but he has no irritation there , it is just at the lateral border of the left eye . EOMI, PERRLA, fundoscopic exam nl Cardiovascular:  Regular rate and rhythm, no rubs murmurs or gallops.  No Carotid bruits, radial pulse intact. No pedal edema.  Respiratory: Clear to auscultation bilaterally.  No wheezes, rales, or rhonchi.  No cyanosis, no use of accessory musculature GI: No organomegaly, abdomen is soft and non-tender, positive bowel sounds.  No masses. Skin: No rashes. Neurologic: Facial musculature symmetric. Psychiatric: Patient is appropriate throughout our interaction. Lymphatic: No cervical lymphadenopathy Musculoskeletal: Gait intact.   LABS: Results for orders placed in visit on 04/24/12  COMPREHENSIVE METABOLIC PANEL      Result Value Range   Sodium 135  135 - 145 mEq/L   Potassium 4.5  3.5 - 5.3 mEq/L   Chloride 96  96 - 112 mEq/L   CO2 31  19 - 32 mEq/L   Glucose, Bld 188 (*) 70 - 99 mg/dL   BUN 14  6 - 23 mg/dL   Creat 1.61  0.96  - 0.45 mg/dL   Total Bilirubin 0.4  0.3 - 1.2 mg/dL   Alkaline Phosphatase 75  39 - 117 U/L   AST 24  0 - 37 U/L   ALT 50  0 - 53 U/L   Total Protein 6.7  6.0 - 8.3 g/dL   Albumin 4.1  3.5 - 5.2 g/dL   Calcium 9.8  8.4 - 40.9 mg/dL  POCT GLYCOSYLATED HEMOGLOBIN (HGB A1C)  Result Value Range   Hemoglobin A1C 9.2       EKG/XRAY:   Primary read interpreted by Dr. Conley Rolls at [facility].   ASSESSMENT/PLAN: Encounter Diagnoses  Name Primary?  . Conjunctivitis Yes  . Eye abrasion, left, initial encounter   . Decreased vision    Fluorscein uptake + one pinpoint spot in iris area, does not correlate with area of pain  He has a gelatinous area in sclera of left lateral eye, conjunctiva is erythematous Rx Erythromycin ointment, eye rest Refer to optho since he is diabetic and has light sensitivity and pain with very poor vision 2/200  in that eye Advise to call his opthalmologist for appt on Monday, we will try to call as well.  Dr. Altamese Cabal Optho Go to Er prn or f/u with [place] prn this weekend if worsening sxs   Jouri Threat PHUONG, DO 01/01/2013 7:41 AM

## 2012-12-31 ENCOUNTER — Encounter: Payer: Self-pay | Admitting: Family Medicine

## 2013-01-23 ENCOUNTER — Ambulatory Visit (INDEPENDENT_AMBULATORY_CARE_PROVIDER_SITE_OTHER): Payer: Medicare Other | Admitting: Family Medicine

## 2013-01-23 ENCOUNTER — Encounter: Payer: Self-pay | Admitting: Family Medicine

## 2013-01-23 VITALS — BP 134/96 | HR 101 | Temp 99.3°F | Ht 68.0 in | Wt 287.0 lb

## 2013-01-23 DIAGNOSIS — M25512 Pain in left shoulder: Secondary | ICD-10-CM

## 2013-01-23 DIAGNOSIS — M25511 Pain in right shoulder: Secondary | ICD-10-CM | POA: Insufficient documentation

## 2013-01-23 DIAGNOSIS — IMO0001 Reserved for inherently not codable concepts without codable children: Secondary | ICD-10-CM

## 2013-01-23 DIAGNOSIS — B351 Tinea unguium: Secondary | ICD-10-CM

## 2013-01-23 DIAGNOSIS — M25519 Pain in unspecified shoulder: Secondary | ICD-10-CM

## 2013-01-23 DIAGNOSIS — E1165 Type 2 diabetes mellitus with hyperglycemia: Secondary | ICD-10-CM

## 2013-01-23 DIAGNOSIS — G8929 Other chronic pain: Secondary | ICD-10-CM | POA: Insufficient documentation

## 2013-01-23 DIAGNOSIS — L219 Seborrheic dermatitis, unspecified: Secondary | ICD-10-CM | POA: Insufficient documentation

## 2013-01-23 DIAGNOSIS — L218 Other seborrheic dermatitis: Secondary | ICD-10-CM

## 2013-01-23 LAB — POCT GLYCOSYLATED HEMOGLOBIN (HGB A1C): Hemoglobin A1C: 9.1

## 2013-01-23 MED ORDER — TERBINAFINE HCL 250 MG PO TABS: 250.0000 mg | ORAL_TABLET | Freq: Every day | ORAL | Status: DC

## 2013-01-23 MED ORDER — INSULIN NPH (HUMAN) (ISOPHANE) 100 UNIT/ML ~~LOC~~ SUSP: SUBCUTANEOUS | Status: DC

## 2013-01-23 MED ORDER — PREDNISONE 5 MG PO TABS: 5.0000 mg | ORAL_TABLET | Freq: Every day | ORAL | Status: AC

## 2013-01-23 MED ORDER — KETOCONAZOLE 2 % EX SHAM: MEDICATED_SHAMPOO | Freq: Every day | CUTANEOUS | Status: DC

## 2013-01-23 NOTE — Assessment & Plan Note (Signed)
I reviewed his record from 2012. He had similar presentation and was treated with oral steroid. As discussed with patient I do not think this is the best option considering his DM,but he insisted this helps a lot. Continue Norco prn. Low dose prednisone prescribed for 1 wk and not 1 month. PT recommended,he declined. Left shoulder xray ordered,patient instructed to go to the hospital for xray. F/U with PCP in 1-2 wks for reassessment.

## 2013-01-23 NOTE — Assessment & Plan Note (Signed)
Ketoconazole shampoo prescribed.

## 2013-01-23 NOTE — Assessment & Plan Note (Signed)
Uncontrolled DM 2 due to non-compliance. He will not get back on Metformin and Lantus. A1C checked today is 9.1 I suggested he increases his Novolin to 30 unit qam and 10 unit qpm. Continue home glucose monitoring and f/u in 2 wks with his PCP with further dose adjustment. He verbalized understanding and agreed with plan.

## 2013-01-23 NOTE — Progress Notes (Signed)
Subjective:     Patient ID: [name], male   DOB: 1966/02/17, 47 y.o.   MRN: [id number]  HPI Scalp Rash: C/O scalp irritation,dryness and hair loss for the last 2 wk,gradually worsening with itching and redness of his scalp due to excessive scratching.He had this before and was treated with Lamisil. His wife had similar symptoms 7 months ago,but not now. Nail discoloration: He is here for discoloration of his toe nails,which he had on and off.He was on Lamisil for few weeks last dose was Jan 2014,this helped a lot, but since he stopped medication,he had started to notice discoloration. He will like to restart Lamisil. Shoulder pain: C/O Left shoulder for over 1 month,worsening over the last few days,mostly pain radiates from the neck to shoulder and shoulder to his neck.Pain is about 10/10 in severity,worse with movement,he denies any recent trauma,he used to work as a Psychologist, occupational which involves lifting heavy objects. In the past he had steroid injection which does not help much, he is on Norco which does not help much,what had helped in the past was steroid PO for about 1 month. NW:GNFAOZH only takes Novolin N 30 unit daily,he is supposed to be on metformin but quit taking it due to GI upset,also Lantus,but he stopped medicine due to financial reasons. His home glucose check runs up and down,this morning it was about 300. He denies hypoglycemic symptoms.  Current Outpatient Prescriptions on File Prior to Visit  Medication Sig Dispense Refill  . Blood Glucose Monitoring Suppl (BLOOD GLUCOSE METER) kit Please provide glucometer for patient to use.  Testing frequency should be qam.  1 each  0  . gabapentin (NEURONTIN) 600 MG tablet Take 1 tablet (600 mg total) by mouth 3 (three) times daily.  90 tablet  3  . HYDROcodone-acetaminophen (NORCO) 10-325 MG per tablet Take 1 tablet by mouth every 6 (six) hours as needed for pain.  120 tablet  0  . insulin NPH (HUMULIN N,NOVOLIN N) 100 UNIT/ML injection  Inject 30 Units into the skin daily before breakfast.      . Insulin Syringe-Needle U-100 29G X 7/16" 0.3 ML MISC Use as directed for insulin injection  100 each  0  . Lancets MISC Please provide lancets to use with glucometer.  Testing frequency qam.  100 each  6  . albuterol (PROVENTIL HFA;VENTOLIN HFA) 108 (90 BASE) MCG/ACT inhaler Inhale 2 puffs into the lungs every 6 (six) hours as needed for wheezing.  1 Inhaler  1  . furosemide (LASIX) 20 MG tablet TAKE 1 TABLET (20 MG TOTAL) BY MOUTH DAILY.  30 tablet  0  . insulin glargine (LANTUS) 100 UNIT/ML injection Inject 15 Units into the skin daily.  10 mL  2  . metFORMIN (GLUCOPHAGE) 500 MG tablet Take 1 tablet (500 mg total) by mouth 2 (two) times daily with a meal.  180 tablet  3  . terbinafine (LAMISIL) 250 MG tablet Take 1 tablet (250 mg total) by mouth daily. For 12 weeks  84 tablet  0  . triamcinolone cream (KENALOG) 0.1 % Apply topically 2 (two) times daily. Apply to lower legs  45 g  0   No current facility-administered medications on file prior to visit.   Past Medical History  Diagnosis Date  . Neuropathy   . Obesity   . Collapsed lung     Left side  . OSA (obstructive sleep apnea)   . Diabetes mellitus   . No pertinent past medical history   .  Pain in back   . Environmental allergies   . Arthritis   . Seizures      Review of Systems  Constitutional: Negative.   Respiratory: Negative.   Cardiovascular: Negative.   Gastrointestinal: Negative.   Genitourinary: Negative.   Musculoskeletal: Positive for arthralgias. Negative for joint swelling.       Shoulder pain  Skin: Positive for rash.  All other systems reviewed and are negative.       Objective:   Physical Exam  Nursing note and vitals reviewed. Constitutional: He appears well-developed. No distress.  Cardiovascular: Normal rate, regular rhythm, normal heart sounds and intact distal pulses.   No murmur heard. Pulmonary/Chest: Effort normal and breath sounds  normal. No respiratory distress. He has no wheezes.  Abdominal: Soft. Bowel sounds are normal. He exhibits no distension and no mass. There is no tenderness.  Musculoskeletal: He exhibits no edema.       Right shoulder: Normal.       Left shoulder: He exhibits decreased range of motion and tenderness. He exhibits no swelling and no crepitus.  Toe nail yellowish discoloration on both feet,mostly first digit,with thickened nail.   Filed Vitals:   01/23/13 0928  BP: 134/96  Pulse: 101  Temp: 99.3 F (37.4 C)  Height: 5\' 8"  (1.727 m)  Weight: 287 lb (130.182 kg)       Assessment/Plan:     Seborrheic dermatitis/scalp. Onychomycosis Left shoulder pain DM2

## 2013-01-23 NOTE — Assessment & Plan Note (Addendum)
Lamisil prescribed. This is recurrent as per patient,would have benefited from toe nail removal,but this affected more than 1 toe nail. RTC in 4wks for liver enzyme and to reassess toe nails.

## 2013-01-23 NOTE — Patient Instructions (Addendum)
Shoulder Range of Motion Exercises  The shoulder is the most flexible joint in the human body. Because of this it is also the most unstable joint in the body. All ages can develop shoulder problems. Early treatment of problems is necessary for a good outcome. People react to shoulder pain by decreasing the movement of the joint. After a brief period of time, the shoulder can become "frozen". This is an almost complete loss of the ability to move the damaged shoulder. Following injuries your caregivers can give you instructions on exercises to keep your range of motion (ability to move your shoulder freely), or regain it if it has been lost.   EXERCISES  EXERCISES TO MAINTAIN THE MOBILITY OF YOUR SHOULDER:  Codman's Exercise or Pendulum Exercise  · This exercise may be performed in a prone (face-down) lying position or standing while leaning on a chair with the opposite arm. Its purpose is to relax the muscles in your shoulder and slowly but surely increase the range of motion and to relieve pain.  · Lie on your stomach close to the side edge of the bed. Let your weak arm hang over the edge of the bed. Relax your shoulder, arm and hand. Let your shoulder blade relax and drop down.  · Slowly and gently swing your arm forward and back. Do not use your neck muscles; relax them. It might be easier to have someone else gently start swinging your arm.  · As pain decreases, increase your swing. To start, arm swing should begin at 15 degree angles. In time and as pain lessens, move to 30-45 degree angles. Start with swinging for about 15 seconds, and work towards swinging for 3 to 5 minutes.  · This exercise may also be performed in a standing/bent over position.  · Stand and hold onto a sturdy chair with your good arm. Bend forward at the waist and bend your knees slightly to help protect your back. Relax your weak arm, let it hang limp. Relax your shoulder blade and let it drop.  · Keep your shoulder relaxed and use body  motion to swing your arm in small circles.  · Stand up tall and relax.  · Repeat motion and change direction of circles.  · Start with swinging for about 30 seconds, and work towards swinging for 3 to 5 minutes.  STRETCHING EXERCISES:  · Lift your arm out in front of you with the elbow bent at 90 degrees. Using your other arm gently pull the elbow forward and across your body.  · Bend one arm behind you with the palm facing outward. Using the other arm, hold a towel or rope and reach this arm up above your head, then bend it at the elbow to move your wrist to behind your neck. Grab the free end of the towel with the hand behind your back. Gently pull the towel up with the hand behind your neck, gradually increasing the pull on the hand behind the small of your back. Then, gradually pull down with the hand behind the small of your back. This will pull the hand and arm behind your neck further. Both shoulders will have an increased range of motion with repetition of this exercise.  STRENGTHENING EXERCISES:  · Standing with your arm at your side and straight out from your shoulder with the elbow bent at 90 degrees, hold onto a small weight and slowly raise your hand so it points straight up in the air. Repeat this five   times to begin with, and gradually increase to ten times. Do this four times per day. As you grow stronger you can gradually increase the weight.  · Repeat the above exercise, only this time using an elastic band. Start with your hand up in the air and pull down until your hand is by your side. As you grow stronger, gradually increase the amount you pull by increasing the number or size of the elastic bands. Use the same amount of repetitions.  · Standing with your hand at your side and holding onto a weight, gradually lift the hand in front of you until it is over your head. Do the same also with the hand remaining at your side and lift the hand away from your body until it is again over your head.  Repeat this five times to begin with, and gradually increase to ten times. Do this four times per day. As you grow stronger you can gradually increase the weight.  Document Released: 02/27/2003 Document Revised: 08/23/2011 Document Reviewed: 05/31/2005  [employer]® Patient Information ©2014 [employer].

## 2013-02-05 ENCOUNTER — Ambulatory Visit (INDEPENDENT_AMBULATORY_CARE_PROVIDER_SITE_OTHER): Payer: Medicare Other | Admitting: Family Medicine

## 2013-02-05 ENCOUNTER — Encounter: Payer: Self-pay | Admitting: Family Medicine

## 2013-02-05 VITALS — BP 130/88 | HR 80 | Temp 98.1°F | Ht 68.0 in | Wt 285.6 lb

## 2013-02-05 DIAGNOSIS — IMO0002 Reserved for concepts with insufficient information to code with codable children: Secondary | ICD-10-CM

## 2013-02-05 DIAGNOSIS — L219 Seborrheic dermatitis, unspecified: Secondary | ICD-10-CM

## 2013-02-05 DIAGNOSIS — L0292 Furuncle, unspecified: Secondary | ICD-10-CM

## 2013-02-05 DIAGNOSIS — B351 Tinea unguium: Secondary | ICD-10-CM

## 2013-02-05 DIAGNOSIS — E1165 Type 2 diabetes mellitus with hyperglycemia: Secondary | ICD-10-CM

## 2013-02-05 DIAGNOSIS — L218 Other seborrheic dermatitis: Secondary | ICD-10-CM

## 2013-02-05 DIAGNOSIS — IMO0001 Reserved for inherently not codable concepts without codable children: Secondary | ICD-10-CM

## 2013-02-05 LAB — GLUCOSE, CAPILLARY: Glucose-Capillary: 242 mg/dL — ABNORMAL HIGH (ref 70–99)

## 2013-02-05 MED ORDER — DOXYCYCLINE HYCLATE 100 MG PO TABS: 100.0000 mg | ORAL_TABLET | Freq: Two times a day (BID) | ORAL | Status: DC

## 2013-02-05 MED ORDER — TERBINAFINE HCL 250 MG PO TABS: 250.0000 mg | ORAL_TABLET | Freq: Every day | ORAL | Status: DC

## 2013-02-05 MED ORDER — KETOCONAZOLE 2 % EX SHAM: MEDICATED_SHAMPOO | Freq: Every day | CUTANEOUS | Status: DC

## 2013-02-05 NOTE — Patient Instructions (Addendum)
Thank you for coming in, today! For your boil:  Leave the bandage in place today and tomorrow.  If you need to change it, change it once per day.  Come back on Wednesday to have the packing pulled and the wound checked.  Make sure you take the doxycycline I prescribed for 10 days. For your diabetes:  Start taking Novolin N 15 units at bedtime. Keep taking 30 in the morning.  Make an appointment to see the pharmacy clinic here (Dr. Raymondo Band).  The pharmacy clinic will help [place] make some decisions about what medicines to help you. If you start running fevers or throwing up, or if the wound gets very red, tender, or swollen, come back sooner or go to the ED. I refilled your shampoo and toenail medicine as well. Please feel free to call with any questions or concerns at any time, at [phone]. --Dr. Casper Harrison

## 2013-02-05 NOTE — Assessment & Plan Note (Signed)
No evidence for frank infection consistent with fungal scalp infection, however some improvement with ketoconazole shampoo. Rx refilled. F/u PRN.

## 2013-02-05 NOTE — Assessment & Plan Note (Signed)
A: Recurrent problem, with new abscess to left medial thigh. Please see procedure note in progress note A&P section. Drained easily without immediate complication.  P: Packed with ~8 cm of sterile packing. F/u in 2 days for packing removal and wound check. Rx for doxycycline for 10 days. Red flags reviewed that would prompt sooner return to clinic or presentation to the ED.

## 2013-02-05 NOTE — Assessment & Plan Note (Signed)
A: Recently prescribed Lamisil, taking without problems. Toenails still with appearance consistent with onychomycoses in more than one nail.  P: Rx for Lamisil for 12 weeks (8 weeks beyond first Rx), but plan to check liver enzymes in roughly 4 weeks and to reassess nails.

## 2013-02-05 NOTE — Assessment & Plan Note (Signed)
A: Poor control as an outpt, A1c 9.1 (similar to past several measurements). Novolin recently increased to 30 units qAM and 10 units qPM. Pt has been unable to tolerate metformin in the past and was not able to afford Lantus.  P: CBG today still 240's. Recommended increase to 15 units qPM. Also requested pt to follow up in pharmacy clinic for consideration of other oral meds and/or medication assistance for Lantus, as basal insulin would likely be very helpful. Will follow up closely after that.

## 2013-02-05 NOTE — Progress Notes (Signed)
Subjective:    Patient ID: [name], male    DOB: 18-Mar-1966, 47 y.o.   MRN: [id number]  HPI: Pt presents to clinic for general follow-up as well as acute complaint of a left thigh "recurrent boil."  # Boils/absceses - pt states he has had numerous boils, "all over the place," on his thighs, buttocks, base of his spine, arms, and face, in the past -several family members have had similar problems -pt reports he has had boils lanced before and typically they do well with I&D and abx, but not with I&D alone -current boil to left thigh present for about 1 week, painful/tender, swollen, no overlying redness, no N/V, chills/fever  # DM - pt reports difficulty managing his CBG's at home; typically have been "not below 300 or so" for at least several days -pt reports he fasted 14 hours last week (only drank water and coffee) and the lowest his sugar got was 290's -CBG 250 this AM, took insulin, was 242 this morning in clinic (last A1c 9.1 14 days ago) -reports compliance with Novolin R (30 units AM, 10 units PM); did not tolerate metformin in the past and was unable to afford Lantus  # Dry scalp / onychomycoses - pt recently seen for dry scalp, concerned for fungal infection, and fungal infection in his toenails -reports compliance with medicated shampoo and Lamisil tablets, but needs refill on shampoo and only had Lamisil Rx for 1 month -states previously required 12 weeks of Lamisil for similar problem with toes, but medication seems to be helping  # Chronic pain - managed by pain clinic; back pain and neuropathy -stable on Norco and gabapentin without new complaints  Pt is a former smoker. He is actively working on going to the gym and eating better. In addition to the above documentation, pt's PMH, surgical history, FH, and SH all reviewed and updated where appropriate in the EMR. I have also reviewed and updated the pt's allergies and current medications as appropriate.   Review of  Systems: As above. Generally feels well, and otherwise, full 12-system ROS was reviewed and all negative.      Objective:   Physical Exam BP 130/88  Pulse 80  Temp(Src) 98.1 F (36.7 C) (Oral)  Ht 5\' 8"  (1.727 m)  Wt 285 lb 9.6 oz (129.547 kg)  BMI 43.44 kg/m2 Gen: well-appearing adult male in NAD HEENT: [place]/AT, sclerae/conjunctivae clear, no lid lag, EOMI, PERRLA   MMM, posterior oropharynx clear, no cervical lymphadenopathy  neck supple with full ROM, no masses appreciated; thyroid not enlarged  Scalp: few areas of drying, flaking, worst around hairline at posterior of skull Skin: large, 6 cm x 7 cm area of tender fluctuance to medial left thigh, with non-draining blisterlike lesion centrally  Lesion without surrounding redness, mildly warm to the touch  Small area of induration (~1 cm, circular) but not fluctuance to base of spine at top of gluteal cleft, not red/tender/draining Toenails: several thickened, discolored, consistent with onychomycoses; no frank drainage or obvious signs of bacterial infection Cardio: RRR, no murmur appreciated; distal pulses intact/symmetric Pulm: CTAB, no wheezes, normal WOB  Abd: soft, obese, nondistended, BS+, no HSM Ext: warm/well-perfused, no cyanosis/clubbing/edema MSK: strength 5/5 in all four extremities, no frank joint deformity/effusion  Gait slightly antalgic, walks with cane Neuro/Psych: alert/oriented, sensation grossly intact; normal gait/balance  mood euthymic with congruent affect     Assessment & Plan:  Precepted with Dr. Mauricio Po. See problem list notes.  Procedure Documentation Verbal and written  consent for I&D of thigh abscess were obtained and properly witnessed. Risks and benefits were discussed and pt elected to proceed. Skin overlying the area was cleaned with iodine. Approx 5 mL of 1% Lidocaine with epinephrine was infiltrated into the skin overlying/surrounding the abscess. There was a thin spraying jet of drainage out of  the blister-like lesion in the center of the fluctuant area after injection with lidocaine. A 4 cm straight incision was made with a #11 scalpel. Immediately approximately 15 mL of bloody purulent fluid was expressed along with a small clot of blood. The wound was probed with plain forceps to disrupt any loculations. Approximately 8 cm of sterile packing was placed into the wound. Total estimated blood loss: 20 mL. Pt tolerated procedure well. There were no immediate complications and the area was dressed with pressure bandage and topical abx ointment.

## 2013-02-05 NOTE — Assessment & Plan Note (Signed)
A: Managed by pain clinic, with Norco and gabapentin. No great complaints, today.  P: Continue pain meds per pain clinic, follow-up PRN.

## 2013-02-08 ENCOUNTER — Ambulatory Visit (INDEPENDENT_AMBULATORY_CARE_PROVIDER_SITE_OTHER): Payer: Medicare Other | Admitting: Pharmacist

## 2013-02-08 ENCOUNTER — Encounter: Payer: Self-pay | Admitting: Pharmacist

## 2013-02-08 VITALS — Ht 68.0 in | Wt 280.0 lb

## 2013-02-08 DIAGNOSIS — E1165 Type 2 diabetes mellitus with hyperglycemia: Secondary | ICD-10-CM

## 2013-02-08 MED ORDER — INSULIN ASPART 100 UNIT/ML ~~LOC~~ SOLN: 5.0000 [IU] | Freq: Three times a day (TID) | SUBCUTANEOUS | Status: DC

## 2013-02-08 MED ORDER — INSULIN GLARGINE 100 UNIT/ML ~~LOC~~ SOLN: 20.0000 [IU] | Freq: Every day | SUBCUTANEOUS | Status: DC

## 2013-02-08 NOTE — Assessment & Plan Note (Signed)
Diabetes diagnosed in 2012.   Currently inadequately controlled based on his A1c values over the past year including his most recent A1c of  Lab Results  Component Value Date   HGBA1C 9.1 01/23/2013  And his home fasting CBG readings of ~300 and 2 hour post-prandial/random CBG readings of 300-400 which appear consistent with his A1c.   Denies hypoglycemic events and is able to verbalize appropriate hypoglycemia management plan.  Reports adherence with medication. He recently discontinued metformin due to GI disturbances. Stopped Novolin N. Started basal insulin Lantus (insulin glargine) 20 units every morning. Started rapid insulin Novolog (insulin aspart) at 5-15 units three times a day with meals. Samples of Lantus and Novolog were provided, patient counseled on proper use. Refer to Emilia Beck for financial assistance. Patient has Medicare [employer]. Written patient instructions provided.  Follow up in Pharmacist Clinic Visit in 3 weeks.   Total time in face to face counseling 45 minutes.  Patient seen with Georga Kaufmann, PharmD Resident.

## 2013-02-08 NOTE — Patient Instructions (Addendum)
Thanks for coming in today!  Stop your current "N" insulin.    Start Lantus 20 units each morning.   Start Novolog 5 units immediately prior to small meals AND 15 units prior to your large meals (currently evening meal)  We will work with someone prior to your next visit to determine what we can do for your insurance coverage.   Next visit with Pharmacy Clinic - 3 weeks.

## 2013-02-08 NOTE — Progress Notes (Signed)
S:    Patient arrives in good spirits, walking with the assistance of a cane.    He presents to the clinic for diabetes management.  Patient reports having history of diabetes for about 2 years. Patient has taken insulin for 1 year. He has recently been on Novolin N due to his inability to afford lantus.  Patient reports difficulty paying for the Lantus he was prescribed at his last visit.  Patient reports an inconsistent diet, often skipping breakfast and lunch.   A/P: Diabetes diagnosed in 2012.   Currently inadequately controlled based on his A1c values over the past year including his most recent A1c of  Lab Results  Component Value Date   HGBA1C 9.1 01/23/2013  And his home fasting CBG readings of ~300 and 2 hour post-prandial/random CBG readings of 300-400 which appear consistent with his A1c.   Denies hypoglycemic events and is able to verbalize appropriate hypoglycemia management plan.  Reports adherence with medication. He recently discontinued metformin due to GI disturbances. Stopped Novolin N. Started basal insulin Lantus (insulin glargine) 20 units every morning. Started rapid insulin Novolog (insulin aspart) at 5-15 units three times a day with meals. Samples of Lantus and Novolog were provided, patient counseled on proper use. Refer to Emilia Beck for financial assistance. Patient has Medicare [employer]. Written patient instructions provided.  Follow up in Pharmacist Clinic Visit in 3 weeks.   Total time in face to face counseling 45 minutes.  Patient seen with Georga Kaufmann, PharmD Resident.

## 2013-02-13 NOTE — Progress Notes (Signed)
Patient ID: [name], male   DOB: 1965-09-08, 47 y.o.   MRN: [id number] Reviewed: Agree with Dr. Macky Lower documentation and management.

## 2013-02-19 ENCOUNTER — Other Ambulatory Visit: Payer: Self-pay | Admitting: Family Medicine

## 2013-03-08 ENCOUNTER — Encounter: Payer: Self-pay | Admitting: Pharmacist

## 2013-03-08 ENCOUNTER — Ambulatory Visit (INDEPENDENT_AMBULATORY_CARE_PROVIDER_SITE_OTHER): Payer: Medicare Other | Admitting: Pharmacist

## 2013-03-08 VITALS — BP 135/83 | HR 78 | Ht 66.5 in | Wt 279.3 lb

## 2013-03-08 DIAGNOSIS — E1165 Type 2 diabetes mellitus with hyperglycemia: Secondary | ICD-10-CM

## 2013-03-08 MED ORDER — INSULIN ASPART 100 UNIT/ML ~~LOC~~ SOLN: 10.0000 [IU] | Freq: Three times a day (TID) | SUBCUTANEOUS | Status: DC

## 2013-03-08 MED ORDER — INSULIN GLARGINE 100 UNIT/ML ~~LOC~~ SOLN: 30.0000 [IU] | Freq: Every day | SUBCUTANEOUS | Status: DC

## 2013-03-08 NOTE — Patient Instructions (Addendum)
Thanks for visiting [place] today.  You are making great progress though we do understand you want to get to your goal faster. We will make a couple changes today in addition with giving you samples for your Novolog and Lantus Please increase your Lantus to 30 units (0.3 ml) daily in the morning Please increase your Novolog to 10-15 units three times daily before meals.  Please stay consistent with your diet, remembering not to "starve yourself." Carbohydrates are fine with your diet, just eat them in moderation. Consider using Aquaphor or Eucerin ointment for your feet.  For information about your insurance, www.medicare.gov is a helpful web site to determine what plans will cover different medications  We will follow up with you at your next visit with [place] at the end of October

## 2013-03-08 NOTE — Progress Notes (Signed)
S:    Patient arrives in good spirits initially, walking with the assistance of a cane, but later expresses frustration in his insurance not covering for his insulins and "no one, including the pharmacy, seems to be able to help him". He was enrolled with the help of another individual into Medicare [facility] a while ago and has been frustrated with its coverage. Presents for diabetes follow up. At his last visit on 8/28, he was started and given samples for taking Lantus 20 units daily along with Novolog 5-15 units three times daily with meals. Med Hx includes metformin which was d/c due to GI distress and Novolin N which was D/Ced at his last visit. Patient reports having history of Diabetes since the year of 2012.   Today he presents with his glucometer, with CBGs 200s-250s (14-day avg of 238) with the lowest glucose of 150. He says he has been starving himself and eating a lot of fruit and vegetables (grapefruit, watermelon, grapes, tomatoes) but is still frustrated with his weight and sugars. Goal weight is 250 lb, which he believes would help alleviate some back pain. He also reports being very anxious and irritable lately because he is always hungry. At this point, he admitted to binge eating at the chinese buffet once in a while as a result of his hunger cravings.  O: 135/83 78 Wt 279.3 lb (consistent with last visit and trending down)   Lab Results  Component Value Date   HGBA1C 9.1 01/23/2013  Home fasting CBG readings of 200s-250s, lowest 150 in the last month   A/P: Diabetes diagnosed in 2012 currently on Lantus 20 units daily and Novolog 5-15 units three times daily with meals. Control is suboptimal due to dietary indiscretion, eating 2 meals a day and "starving himself," but admits to binge eating occasionally. CBGs currently in the 200s-250s. This is much improved since his last visit (CBGs 300-400s). His weight also appears to be trending down. Denies hypoglycemic  events and is able to verbalize appropriate hypoglycemia management plan. Reports adherence with medication. Changes today to provide increased coverage include increasing Lantus to 30 units daily and Novolog to 10-15 units TID with meals. Additionally, counseled on sugar content of various fruits and dietary products. Finally, as patient was extremely frustrated with "feeling like he is being bounced around from provider to provider about his insurance coverage," helped patient navigate Medicare [employer]ARP formulary to identify coverage and directed him to enroll in new insurance for next period. Written patient instructions provided.  Follow up in Pharmacist Clinic Visit at the end of October.   Total time in face to face counseling 65 minutes.  Patient seen with Valene Bors, PharmD Candidate and Anthony Sar, PharmD Resident.

## 2013-03-08 NOTE — Assessment & Plan Note (Signed)
Diabetes diagnosed in 2012 currently on Lantus 20 units daily and Novolog 5-15 units three times daily with meals. Control is suboptimal due to dietary indiscretion, eating 2 meals a day and "starving himself," but admits to binge eating occasionally. CBGs currently in the 200s-250s. This is much improved since his last visit (CBGs 300-400s). His weight also appears to be trending down. Denies hypoglycemic events and is able to verbalize appropriate hypoglycemia management plan. Reports adherence with medication. Changes today to provide increased coverage include increasing Lantus to 30 units daily and Novolog to 10-15 units TID with meals. Additionally, counseled on sugar content of various fruits and dietary products. Finally, as patient was extremely frustrated with "feeling like he is being bounced around from provider to provider about his insurance coverage," helped patient navigate Medicare AARP formulary to identify coverage and directed him to enroll in new insurance for next period. Written patient instructions provided.  Follow up in Pharmacist Clinic Visit at the end of October.   Total time in face to face counseling 65 minutes.  Patient seen with Valene Bors, PharmD Candidate and Anthony Sar, PharmD Resident.

## 2013-03-15 NOTE — Progress Notes (Signed)
Patient ID: [name], male   DOB: 01/03/1966, 46 y.o.   MRN: [id number] Reviewed: Agree with Dr. Koval's documentation and management.   

## 2013-03-21 ENCOUNTER — Other Ambulatory Visit: Payer: Self-pay | Admitting: Family Medicine

## 2013-03-21 DIAGNOSIS — B351 Tinea unguium: Secondary | ICD-10-CM

## 2013-03-21 MED ORDER — TERBINAFINE HCL 250 MG PO TABS: 250.0000 mg | ORAL_TABLET | Freq: Every day | ORAL | Status: DC

## 2013-04-10 ENCOUNTER — Encounter: Payer: Self-pay | Admitting: Pharmacist

## 2013-04-10 ENCOUNTER — Ambulatory Visit (INDEPENDENT_AMBULATORY_CARE_PROVIDER_SITE_OTHER): Payer: Medicare Other | Admitting: Pharmacist

## 2013-04-10 VITALS — BP 122/86 | HR 90 | Ht 68.0 in | Wt 282.1 lb

## 2013-04-10 DIAGNOSIS — B351 Tinea unguium: Secondary | ICD-10-CM

## 2013-04-10 DIAGNOSIS — L218 Other seborrheic dermatitis: Secondary | ICD-10-CM

## 2013-04-10 DIAGNOSIS — F17201 Nicotine dependence, unspecified, in remission: Secondary | ICD-10-CM | POA: Insufficient documentation

## 2013-04-10 DIAGNOSIS — E1165 Type 2 diabetes mellitus with hyperglycemia: Secondary | ICD-10-CM

## 2013-04-10 DIAGNOSIS — Z87891 Personal history of nicotine dependence: Secondary | ICD-10-CM

## 2013-04-10 DIAGNOSIS — L219 Seborrheic dermatitis, unspecified: Secondary | ICD-10-CM

## 2013-04-10 MED ORDER — INSULIN ASPART 100 UNIT/ML ~~LOC~~ SOLN: 15.0000 [IU] | Freq: Three times a day (TID) | SUBCUTANEOUS | Status: DC

## 2013-04-10 MED ORDER — TRIAMCINOLONE ACETONIDE 0.1 % EX CREA: TOPICAL_CREAM | Freq: Two times a day (BID) | CUTANEOUS | Status: DC

## 2013-04-10 MED ORDER — INSULIN GLARGINE 100 UNIT/ML ~~LOC~~ SOLN: 36.0000 [IU] | Freq: Every day | SUBCUTANEOUS | Status: DC

## 2013-04-10 MED ORDER — KETOCONAZOLE 2 % EX SHAM: MEDICATED_SHAMPOO | Freq: Every day | CUTANEOUS | Status: DC

## 2013-04-10 MED ORDER — TERBINAFINE HCL 250 MG PO TABS: 250.0000 mg | ORAL_TABLET | Freq: Every day | ORAL | Status: DC

## 2013-04-10 NOTE — Assessment & Plan Note (Signed)
Diabetes currently uncontrolled.   Denies hypoglycemic events and is able to verbalize appropriate hypoglycemia management plan.  Reports adherence with medication.Control is suboptimal due to his diet and poor control of eating habits.  Increased dose of basal insulin Lantus (insulin glargine) to 36 units daily. Increased dose of rapid insulin Novolog (insulin aspart) to 15 units with each meal. He was encouraged to eat two smaller meals or to move his biggest meal of the day to lunchtime. He appeared agreeable to this idea. He was also encouraged to exercise one more day each week.  We refilled triamcinolone cream, terbinafine and ketoconazole shampoo for prescriptions that were previously written. We provided samples of Lantus and Novolog. Written patient instructions provided.  Follow up with Dr. Casper Harrison in November.   Total time in face to face counseling 45 minutes.  Patient seen with Morrell Riddle, PharmD Candidate and Piedad Climes, PharmD Resident. Marland Kitchen

## 2013-04-10 NOTE — Patient Instructions (Addendum)
Thank you for coming in today!  Great job on continuing to not smoke.  Your blood sugar is doing better. We would like you to start taking 36 units of Lantus every day and 15 units of Novolog with each meal. You are making great progress--try to exercise one more day each week and eat two meals each day.  We will send in refills of your prescriptions.  We would like you to come back in November to see Dr. Casper Harrison.   Keep up the good work!

## 2013-04-10 NOTE — Addendum Note (Signed)
Addended by: Kathrin Ruddy on: 04/10/2013 06:06 PM   Modules accepted: Orders

## 2013-04-10 NOTE — Progress Notes (Signed)
S:    Patient arrives in good spirits, walking with his cane.    Presents for diabetes follow up.  Patient is currently taking Lantus 30 units daily and Novolog 10-15 units with meals. He states that he uses 10 units of Novolog with smaller meals and 15 units with bigger meals. He reports that he tries to participate in water aerobics every other day for about 1 hour. He avoids fried foods, alcohol, carbohydrates, desserts, sugared drinks. He eats mainly meat and vegetables but admits that he struggles with portion size. He usually eats only 1 or 2 meals a day and dinner will be a very large meal. He reports that "a pot roast is like a steak to me", he is always thinking about food and has trouble stopping once he has started eating. He states that he was having trouble affording his medication but has found someone to help him sign up for a better insurance plan that will cover his medications.   O:  . Lab Results  Component Value Date   HGBA1C 9.1 01/23/2013     Home fasting CBG readings usually in the low 200's. Lowest BG of 156, highest of 415.     A/P: Diabetes currently uncontrolled.   Denies hypoglycemic events and is able to verbalize appropriate hypoglycemia management plan.  Reports adherence with medication.Control is suboptimal due to his diet and poor control of eating habits.  Increased dose of basal insulin Lantus (insulin glargine) to 36 units daily. Increased dose of rapid insulin Novolog (insulin aspart) to 15 units with each meal. He was encouraged to eat two smaller meals or to move his biggest meal of the day to lunchtime. He appeared agreeable to this idea. He was also encouraged to exercise one more day each week.  We refilled triamcinolone cream, terbinafine and ketoconazole shampoo for prescriptions that were previously written. We provided samples of Lantus and Novolog. Written patient instructions provided.  Follow up with Dr. Casper Harrison in November.   Total time in face to  face counseling 45 minutes.  Patient seen with Morrell Riddle, PharmD Candidate and Piedad Climes, PharmD Resident. Marland Kitchen

## 2013-04-11 NOTE — Progress Notes (Signed)
Patient ID: [name], male   DOB: 07-Oct-1965, 47 y.o.   MRN: [id number] Reviewed: Agree with Dr. Macky Lower documentation and management.

## 2013-04-16 ENCOUNTER — Encounter: Payer: Self-pay | Admitting: Family Medicine

## 2013-04-30 ENCOUNTER — Other Ambulatory Visit: Payer: Self-pay | Admitting: Family Medicine

## 2013-05-09 ENCOUNTER — Ambulatory Visit (INDEPENDENT_AMBULATORY_CARE_PROVIDER_SITE_OTHER): Payer: Medicare Other | Admitting: Family Medicine

## 2013-05-09 ENCOUNTER — Encounter: Payer: Self-pay | Admitting: Family Medicine

## 2013-05-09 VITALS — BP 131/68 | HR 74 | Temp 98.2°F | Ht 68.0 in | Wt 284.9 lb

## 2013-05-09 DIAGNOSIS — E1165 Type 2 diabetes mellitus with hyperglycemia: Secondary | ICD-10-CM

## 2013-05-09 DIAGNOSIS — I878 Other specified disorders of veins: Secondary | ICD-10-CM

## 2013-05-09 DIAGNOSIS — IMO0002 Reserved for concepts with insufficient information to code with codable children: Secondary | ICD-10-CM

## 2013-05-09 DIAGNOSIS — I872 Venous insufficiency (chronic) (peripheral): Secondary | ICD-10-CM

## 2013-05-09 DIAGNOSIS — B351 Tinea unguium: Secondary | ICD-10-CM

## 2013-05-09 LAB — COMPREHENSIVE METABOLIC PANEL
Albumin: 4.2 g/dL (ref 3.5–5.2)
Alkaline Phosphatase: 72 U/L (ref 39–117)
BUN: 19 mg/dL (ref 6–23)
Calcium: 9.6 mg/dL (ref 8.4–10.5)
Chloride: 99 mEq/L (ref 96–112)
Creat: 0.83 mg/dL (ref 0.50–1.35)
Glucose, Bld: 209 mg/dL — ABNORMAL HIGH (ref 70–99)
Potassium: 4.2 mEq/L (ref 3.5–5.3)
Sodium: 137 mEq/L (ref 135–145)
Total Protein: 7 g/dL (ref 6.0–8.3)

## 2013-05-09 NOTE — Progress Notes (Signed)
   Subjective:    Patient ID: [name], male    DOB: [birth year], 47 y.o.   MRN: [id number]  HPI: Pt presents to clinic for general follow-up.  DM - Recently saw Dr. Raymondo Band and had Lantus and Novolog increased -reports compliance with meds and has been controlling sugars better -meter brought in today - last few weeks, more in the range of 150-190 -denies hypoglycemic episodes -has made many lifestyle changes, fewer carbs/bread/rice/pasta, more grilled foods, and is exercising 2-3 times per week  Chronic pain - seeing pain clinic, doing "okay" with Norco and gabapentin -may get some more steroid shots in the near future (has had several shots in the last few years)  Onychomycoses - toenails still thickened but slowly getting better -still taking Lamisil, which has been helping  Review of Systems: As above. Generally feels well. Otherwise denies fever, chills, N/V.     Objective:   Physical Exam BP 131/68  Pulse 74  Temp(Src) 98.2 F (36.8 C) (Oral)  Ht 5\' 8"  (1.727 m)  Wt 284 lb 14.4 oz (129.23 kg)  BMI 43.33 kg/m2 Gen: well-appearing adult male in NAD Cardio: RRR, no murmur appreciated; distal pulses intact/symmetric Pulm: CTAB, no wheezes, normal WOB  Ext: warm/well-perfused, no cyanosis/clubbing  LE bilaterally with darkened skin and symmetric 2+ edema from ankles to upper shin/calf, tender  Overlying skin with some flaking but no overt ulceration, induration, bleeding or drainage Toenails: generally improved from last visit several months ago, though still with several thick/discolored nails  Toes without frank drainage, redness/swelling, or obvious signs of bacterial infection     Assessment & Plan:

## 2013-05-09 NOTE — Patient Instructions (Signed)
Thank you for coming in, today!  For your diabetes: Keep up the great work! The changes you've made have been very good. We'll check your A1c today.  For your toenails: You can keep taking the Lamisil. I'll check your liver function, today, since you've been on that medicine for a while.  I will call you or send you a letter with the results. Try to keep your feet up in the evenings / at night, since you can't wear the compression stockings. I want you to come back in about 3-4 months or sooner if you need. Make sure to keep checking your sugar and bring your meter with you next time. Please feel free to call with any questions or concerns at any time, at [phone]. --Dr. Casper Harrison

## 2013-05-09 NOTE — Assessment & Plan Note (Signed)
Bilaterally, worse with change in weather, some evidence of stasis dermatitis. No ulceration but some skin flaking/scaling. Pt intolerant to compression stockings in the past. Advised putting feet up at night, continue to increase activity level, avoid sitting for long periods of time. F/u as needed.

## 2013-05-09 NOTE — Assessment & Plan Note (Signed)
A: Much improved, with A1c 7.8, down from >9 in August. Doing well with lifestyle changes, and CBG monitor with recent readings more in the 150-180 range.  P: Continue recently-increased Lantus (36 units daily) and Novolog (15 units at mealtimes). Continue CBG checks. Plan to recheck A1c in about 3 months, hopeful for continued improvement.

## 2013-05-09 NOTE — Assessment & Plan Note (Signed)
A: Managed by pain clinic, on Norco and gabapentin. Doing well today. Due for steroid shots soon.  P: Continue pain medications / treatment plans per pain clinic. F/u PRN.

## 2013-05-09 NOTE — Assessment & Plan Note (Signed)
A: Improving slowly with Lamisil, nails still thickened but less discoloration. Less painful.  P: Continue Lamisil for now. Will check liver function, since pt has been on Lamisil for a few months, now. Likely will stop when Rx runs out this time.

## 2013-05-14 ENCOUNTER — Encounter: Payer: Self-pay | Admitting: Family Medicine

## 2013-07-13 ENCOUNTER — Telehealth: Payer: Self-pay | Admitting: Family Medicine

## 2013-07-13 NOTE — Telephone Encounter (Signed)
Just following up on RX request for pt's diabetic supplies Please calle

## 2013-07-13 NOTE — Telephone Encounter (Signed)
Attempted to call several times; left message once about 5 hours ago, then called back and got voice mail x3. Spoke to a representative and was identifying myself and call failed or I was hung up on. I have not received any medication or supply refill requests. Will wait to receive request or will f/u if someone else calls back. --CMS

## 2013-07-23 ENCOUNTER — Telehealth: Payer: Self-pay | Admitting: Family Medicine

## 2013-07-23 NOTE — Telephone Encounter (Signed)
Pt called and needs a refill on his Novolog and bottled Lantus . jw

## 2013-07-23 NOTE — Telephone Encounter (Signed)
Will fwd to MD.  Triniti Gruetzmacher L, CMA  

## 2013-07-24 MED ORDER — INSULIN GLARGINE 100 UNIT/ML ~~LOC~~ SOLN: 36.0000 [IU] | Freq: Every day | SUBCUTANEOUS | Status: DC

## 2013-07-24 MED ORDER — INSULIN ASPART 100 UNIT/ML ~~LOC~~ SOLN: 15.0000 [IU] | Freq: Three times a day (TID) | SUBCUTANEOUS | Status: DC

## 2013-07-24 NOTE — Telephone Encounter (Signed)
Called pt. Informed. Agreed. .[name]  

## 2013-07-24 NOTE — Addendum Note (Signed)
Addended by: Bobbye MortonSTREET, Feven Alderfer M on: 07/24/2013 09:09 AM   Modules accepted: Orders

## 2013-07-24 NOTE — Telephone Encounter (Addendum)
Rx's sent to pt's pharmacy ([facility], [address]), but previous Rx are listed as samples. Please call pt to let him know Rx has been sent in, but if he normally gets samples from the clinic, I will have to talk to Dr. Raymondo BandKoval to make sure the right procedure is followed. Please also ask him to schedule a follow-up appointment with me sometime in the next 1-2 months. Thanks! --CMS

## 2013-07-27 ENCOUNTER — Emergency Department (HOSPITAL_COMMUNITY)
Admission: EM | Admit: 2013-07-27 | Discharge: 2013-07-27 | Disposition: A | Discharge status: Home / Self Care | Payer: Medicare Other | Attending: Emergency Medicine | Admitting: Emergency Medicine

## 2013-07-27 ENCOUNTER — Encounter (HOSPITAL_COMMUNITY): Payer: Self-pay | Admitting: Emergency Medicine

## 2013-07-27 DIAGNOSIS — Z7982 Long term (current) use of aspirin: Secondary | ICD-10-CM | POA: Insufficient documentation

## 2013-07-27 DIAGNOSIS — E119 Type 2 diabetes mellitus without complications: Secondary | ICD-10-CM | POA: Insufficient documentation

## 2013-07-27 DIAGNOSIS — M129 Arthropathy, unspecified: Secondary | ICD-10-CM | POA: Insufficient documentation

## 2013-07-27 DIAGNOSIS — K0889 Other specified disorders of teeth and supporting structures: Secondary | ICD-10-CM

## 2013-07-27 DIAGNOSIS — K029 Dental caries, unspecified: Secondary | ICD-10-CM | POA: Insufficient documentation

## 2013-07-27 DIAGNOSIS — Z87891 Personal history of nicotine dependence: Secondary | ICD-10-CM | POA: Insufficient documentation

## 2013-07-27 DIAGNOSIS — G40909 Epilepsy, unspecified, not intractable, without status epilepticus: Secondary | ICD-10-CM | POA: Insufficient documentation

## 2013-07-27 DIAGNOSIS — Z791 Long term (current) use of non-steroidal anti-inflammatories (NSAID): Secondary | ICD-10-CM | POA: Insufficient documentation

## 2013-07-27 DIAGNOSIS — E669 Obesity, unspecified: Secondary | ICD-10-CM | POA: Insufficient documentation

## 2013-07-27 DIAGNOSIS — Z880 Allergy status to penicillin: Secondary | ICD-10-CM | POA: Insufficient documentation

## 2013-07-27 DIAGNOSIS — Z794 Long term (current) use of insulin: Secondary | ICD-10-CM | POA: Insufficient documentation

## 2013-07-27 DIAGNOSIS — G589 Mononeuropathy, unspecified: Secondary | ICD-10-CM | POA: Insufficient documentation

## 2013-07-27 DIAGNOSIS — Z8709 Personal history of other diseases of the respiratory system: Secondary | ICD-10-CM | POA: Insufficient documentation

## 2013-07-27 MED ORDER — CLINDAMYCIN HCL 300 MG PO CAPS: 300.0000 mg | ORAL_CAPSULE | Freq: Three times a day (TID) | ORAL | Status: DC

## 2013-07-27 MED ORDER — BUPIVACAINE HCL (PF) 0.5 % IJ SOLN
10.0000 mL | Freq: Once | INTRAMUSCULAR | Status: AC
  Administered 2013-07-27: 10 mL
  Filled 2013-07-27: qty 30

## 2013-07-27 NOTE — ED Notes (Signed)
Pt states that half of his upper jaw is numb and the other half still hurts 8/10.  Pa here to see pt about this again

## 2013-07-27 NOTE — ED Notes (Signed)
Pt is c/o toothache  Pt states the tooth is loose and painful and the nerve is exposed

## 2013-07-27 NOTE — ED Provider Notes (Signed)
CSN: [id number]     Arrival date & time 07/27/13  [id number] History   First MD Initiated Contact with Patient 07/27/13 0654     Chief Complaint  Patient presents with  . Dental Pain     (Consider location/radiation/quality/duration/timing/severity/associated sxs/prior Treatment) HPI 48 yo white male with a PMH of OSA, Obesity, and seizures presents with dental pain for 3 days. Patient has tried unsuccessfully to visit multiple dentists to remove his upper left central incisor. Patient describes his pain as unbearable and his current pain medications aren't touching his dental pain. He denies dysphagia, neck swelling, weakness, fever, and dyspnea.   Past Medical History  Diagnosis Date  . Neuropathy   . Obesity   . Collapsed lung     Left side  . OSA (obstructive sleep apnea)   . Diabetes mellitus   . No pertinent past medical history   . Pain in back   . Environmental allergies   . Arthritis   . Seizures    Past Surgical History  Procedure Laterality Date  . Back surgery    . Appendectomy    . Eye surgery    . Fracture surgery    . Incision and drainage  02/02/2012        Family History  Problem Relation Age of Onset  . Diabetes Mother   . Asthma Mother   . COPD Mother   . Obesity Mother   . Diabetes Maternal Aunt   . Asthma Maternal Aunt   . Obesity Maternal Aunt   . Diabetes Maternal Uncle   . Asthma Maternal Uncle   . Obesity Maternal Uncle   . Diabetes Maternal Grandmother   . Asthma Maternal Grandmother   . Obesity Maternal Grandmother   . Diabetes Maternal Grandfather   . Asthma Maternal Grandfather   . Obesity Maternal Grandfather    History  Substance Use Topics  . Smoking status: Former Smoker -- 1.50 packs/day for 32 years    Types: Cigarettes    Quit date: 01/30/2012  . Smokeless tobacco: Never Used  . Alcohol Use: No    Review of Systems  HENT: Positive for dental problem. Negative for drooling, sore throat and trouble swallowing.   Respiratory:  Negative for cough and shortness of breath.       Allergies  Penicillins and Metformin and related  Home Medications   Current Outpatient Rx  Name  Route  Sig  Dispense  Refill  . aspirin 81 MG tablet   Oral   Take 81 mg by mouth daily.         . diclofenac sodium (VOLTAREN) 1 % GEL   Topical   Apply 4 g topically 4 (four) times daily.         Marland Kitchen. gabapentin (NEURONTIN) 600 MG tablet   Oral   Take 1 tablet (600 mg total) by mouth 3 (three) times daily.   90 tablet   3   . HYDROcodone-acetaminophen (NORCO) 10-325 MG per tablet   Oral   Take 1 tablet by mouth every 6 (six) hours as needed for pain.   120 tablet   0   . insulin aspart (NOVOLOG) 100 UNIT/ML injection   Subcutaneous   Inject 15 Units into the skin 3 (three) times daily before meals.   2 vial   0   . insulin glargine (LANTUS) 100 UNIT/ML injection   Subcutaneous   Inject 0.36 mLs (36 Units total) into the skin daily.   20 mL  0   . Insulin Syringe-Needle U-100 29G X 7/16" 0.3 ML MISC      Use as directed for insulin injection   100 each   0   . Lancets MISC      Please provide lancets to use with glucometer.  Testing frequency qam.   100 each   6   . terbinafine (LAMISIL) 250 MG tablet      TAKE 1 TABLET (250 MG TOTAL) BY MOUTH DAILY.   30 tablet   0   . triamcinolone cream (KENALOG) 0.1 %   Topical   Apply topically 2 (two) times daily. Apply to lower legs   60 g   3    BP 160/108  Pulse 75  Temp(Src) 97.7 F (36.5 C) (Oral)  Resp 16  Ht 5\' 7"  (1.702 m)  Wt 280 lb (127.007 kg)  BMI 43.84 kg/m2  SpO2 96% Physical Exam  Constitutional: He appears well-developed and well-nourished. He appears distressed.  HENT:  Head: Normocephalic and atraumatic.  Nose: Nose normal.  Mouth/Throat: Mucous membranes are normal. He does not have dentures. Abnormal dentition. Dental caries present. No dental abscesses. No oropharyngeal exudate, posterior oropharyngeal edema or tonsillar  abscesses.    Neck: Normal range of motion. Neck supple.    ED Course  Dental Date/Time: 07/27/2013 7:44 AM Performed by: Carlyle Dolly Authorized by: Carlyle Dolly Consent: Verbal consent obtained. written consent not obtained. Risks and benefits: risks, benefits and alternatives were discussed Consent given by: patient Patient understanding: patient states understanding of the procedure being performed Patient consent: the patient's understanding of the procedure matches consent given Procedure consent: procedure consent matches procedure scheduled Patient identity confirmed: verbally with patient Time out: Immediately prior to procedure a "time out" was called to verify the correct patient, procedure, equipment, support staff and site/side marked as required. Local anesthesia used: yes Anesthesia: nerve block Local anesthetic: bupivacaine 0.5% with epinephrine Anesthetic total: 4 ml Patient sedated: no Patient tolerance: Patient tolerated the procedure well with no immediate complications.    Patient is referred to dentistry. Told to return here as needed.   Carlyle Dolly, PA-C 07/27/13 [phone]

## 2013-07-27 NOTE — Discharge Instructions (Signed)
Return here as needed. Follow up with the dentist provided. °

## 2013-07-28 NOTE — ED Provider Notes (Signed)
Medical screening examination/treatment/procedure(s) were performed by non-physician practitioner and as supervising physician I was immediately available for consultation/collaboration.  EKG Interpretation   None        Derwood KaplanAnkit Ariellah Faust, MD 07/28/13 [phone]

## 2013-08-07 ENCOUNTER — Ambulatory Visit: Payer: Medicare Other | Admitting: Family Medicine

## 2013-08-22 ENCOUNTER — Other Ambulatory Visit: Payer: Self-pay | Admitting: Family Medicine

## 2013-09-03 ENCOUNTER — Ambulatory Visit (INDEPENDENT_AMBULATORY_CARE_PROVIDER_SITE_OTHER): Payer: Medicare Other | Admitting: Family Medicine

## 2013-09-03 ENCOUNTER — Encounter: Payer: Self-pay | Admitting: Family Medicine

## 2013-09-03 VITALS — BP 145/101 | HR 67 | Temp 98.1°F | Wt 284.0 lb

## 2013-09-03 DIAGNOSIS — I878 Other specified disorders of veins: Secondary | ICD-10-CM

## 2013-09-03 DIAGNOSIS — R41840 Attention and concentration deficit: Secondary | ICD-10-CM

## 2013-09-03 DIAGNOSIS — I10 Essential (primary) hypertension: Secondary | ICD-10-CM

## 2013-09-03 DIAGNOSIS — I872 Venous insufficiency (chronic) (peripheral): Secondary | ICD-10-CM

## 2013-09-03 DIAGNOSIS — IMO0001 Reserved for inherently not codable concepts without codable children: Secondary | ICD-10-CM

## 2013-09-03 DIAGNOSIS — R5381 Other malaise: Secondary | ICD-10-CM

## 2013-09-03 DIAGNOSIS — R5383 Other fatigue: Secondary | ICD-10-CM

## 2013-09-03 DIAGNOSIS — M25519 Pain in unspecified shoulder: Secondary | ICD-10-CM

## 2013-09-03 DIAGNOSIS — E1165 Type 2 diabetes mellitus with hyperglycemia: Secondary | ICD-10-CM

## 2013-09-03 DIAGNOSIS — IMO0002 Reserved for concepts with insufficient information to code with codable children: Secondary | ICD-10-CM

## 2013-09-03 LAB — COMPREHENSIVE METABOLIC PANEL
ALBUMIN: 4.1 g/dL (ref 3.5–5.2)
ALK PHOS: 58 U/L (ref 39–117)
ALT: 25 U/L (ref 0–53)
AST: 14 U/L (ref 0–37)
BUN: 16 mg/dL (ref 6–23)
CO2: 31 mEq/L (ref 19–32)
CREATININE: 0.83 mg/dL (ref 0.50–1.35)
Calcium: 8.6 mg/dL (ref 8.4–10.5)
Chloride: 100 mEq/L (ref 96–112)
Glucose, Bld: 205 mg/dL — ABNORMAL HIGH (ref 70–99)
POTASSIUM: 4.5 meq/L (ref 3.5–5.3)
Sodium: 138 mEq/L (ref 135–145)
Total Bilirubin: 0.3 mg/dL (ref 0.2–1.2)
Total Protein: 6.4 g/dL (ref 6.0–8.3)

## 2013-09-03 LAB — CBC WITH DIFFERENTIAL/PLATELET
Basophils Absolute: 0 10*3/uL (ref 0.0–0.1)
Basophils Relative: 0 % (ref 0–1)
EOS PCT: 3 % (ref 0–5)
Eosinophils Absolute: 0.3 10*3/uL (ref 0.0–0.7)
HEMATOCRIT: 43.3 % (ref 39.0–52.0)
HEMOGLOBIN: 14.6 g/dL (ref 13.0–17.0)
LYMPHS ABS: 3.4 10*3/uL (ref 0.7–4.0)
Lymphocytes Relative: 40 % (ref 12–46)
MCH: 30 pg (ref 26.0–34.0)
MCHC: 33.7 g/dL (ref 30.0–36.0)
MCV: 88.9 fL (ref 78.0–100.0)
MONOS PCT: 6 % (ref 3–12)
Monocytes Absolute: 0.5 10*3/uL (ref 0.1–1.0)
Neutro Abs: 4.3 10*3/uL (ref 1.7–7.7)
Neutrophils Relative %: 51 % (ref 43–77)
Platelets: 245 10*3/uL (ref 150–400)
RBC: 4.87 MIL/uL (ref 4.22–5.81)
RDW: 13.9 % (ref 11.5–15.5)
WBC: 8.4 10*3/uL (ref 4.0–10.5)

## 2013-09-03 LAB — TSH: TSH: 1.661 u[IU]/mL (ref 0.350–4.500)

## 2013-09-03 LAB — POCT GLYCOSYLATED HEMOGLOBIN (HGB A1C): Hemoglobin A1C: 7.1

## 2013-09-03 NOTE — Assessment & Plan Note (Signed)
Chronic bilaterally in LE's, with some chronic stasis dermatitis. No frank ulcerations. Intolerant to compression stockings in the past. Continue supportive care, with good exercise, elevation as needed, avoiding sitting for prolonged periods, etc. F/u as needed.

## 2013-09-03 NOTE — Assessment & Plan Note (Signed)
A: Generally better controlled than in previous months; home CBG meter shows most readings 150's-200, with a few 250-300. Denies hypoglycemic episodes and compliant with insulin therapy and diet / exercise changes.  P: Continue insulin (Lantus 36 units qAM, Novolog 15 units at meals), CBG checks. Check A1c today. Will also check kidney function. F/u in about 3 months or sooner if needed; consider f/u at pharmacy clinic, but pt appears relatively stable and has done very well with Dr. Macky LowerKoval's assistance.

## 2013-09-03 NOTE — Assessment & Plan Note (Signed)
BP elevated today, likely secondary to bilateral shoulder pain. Pt is not on an ACE; need to consider this in DM -- will clarify if pt has been intolerant in the past, at next follow-up. Monitor routinely, for now. Expect may not need pharmacologic control if pain is better controlled. Will check kidney function, today.

## 2013-09-03 NOTE — Patient Instructions (Signed)
Thank you for coming in, today!  For your diabetes: Don't make any changes, for now. Keep taking your insulin as prescribed. Keep checking your sugars regularly. We'll check your A1c today.  For your shoulder pain: Talk to your pain doctors and give them the letter I wrote. If they aren't able to change anything or don't think they can help, call me back, and we'll think about referring you ortho, and / or I can do shoulder injections.  For your concentration: I will check your thyroid function today, because that may be involved. I do not manage adult ADHD, myself. Try talking to the [facility] [place]. Their phone number is [phone].  Come back to see me in about 3 months or sooner if you need.  Please feel free to call with any questions or concerns at any time, at [phone]. --Dr. Casper HarrisonStreet

## 2013-09-03 NOTE — Assessment & Plan Note (Signed)
P: Significant bilateral shoulder pain and limitation of range of motion, bilaterally, for several months, also with significant neck spasm. Difficult to assess due to pain, but possible component of adhesive capsulitis, as well. Similar presentations in the past, managed with oral steroids and injections, with only temporary relief. Chronic back pain managed by pain clinic, so will defer any prescriptions, for now.   A: Provided pt with letter to pain management clinic providers requesting assistance; if they are comfortable with me managing muscle relaxer therapy / injections, I will plan to do this at f/u. Otherwise will consider referral to ortho; pt has seen [facility] in the past and was dismissed for insurance difficulties, so would refer to another practice, if needed.

## 2013-09-03 NOTE — Assessment & Plan Note (Signed)
Also with some chronic fatigue; see separate problem list note (labs ordered today). Uncertain if this represents adult [facility] or not, as pt is concerned, though this is possible. [facility] -- if they are not the best option for him, will consider referral specifically for psychologic testing. Will also discuss with Dr. Pascal LuxKane. Otherwise f/u as needed.

## 2013-09-03 NOTE — Assessment & Plan Note (Signed)
Worse lately, with disturbance of memory, as well. Uncertain cause; never has had difficulty with thyroid in the past, but doesn't think he has been checked in a while. Does have some chronic skin changes (brittle hair, etc). Will check CBC, electrolytes, and TSH, today. F/u as needed and based on lab results.

## 2013-09-03 NOTE — Progress Notes (Signed)
   Subjective:    Patient ID: [name], male    DOB: [birth year], 48 y.o.   MRN: [id number]  HPI: Pt presents to clinic for general follow-up. He does complain specifically of bilateral shoulder pain and difficulty concentrating.  DM / neuropathy - generally doing well, without new complaints - reports compliance with Lantus and Novolog, denies hypoglycemic episodes - typically high in the mornings; takes Lantus in the mornings after his wake-up CBG check - has made big changes in eating / exercise habits and generally feels much improved - CBG meter records are generally 130's-200, with a few 250-300 readings (typically in the AM) - gabapentin does decently for neuropathic pain; managed by pain clinic  Chronic pain / shoulder pain - managed by pain clinic, generally doing "okay" - has had bilateral shoulder injections in the past, but they only help for a few months at a time (last about 8 months ago) - shoulder pain long-standing, worse with lifting arms up (adduction); L > R, has been worse for about 1 month - described as an aching pain, worse through the day, with neck spasm / pain  Difficulty concentrating - reports concern for "adult ADHD" - has been gradually worse over the past several months - describes forgetfulness (forgetting items at the store, failing to remember to do daily activities, etc) difficulty focusing / concentrating on a task, etc - states ADHD runs in his family, in his children especially; never was diagnosed as a child, himself - never thinks he has had thyroid problems  Pt is a never-smoker. In addition to the above documentation, pt's PMH, surgical history, FH, and SH all reviewed and updated where appropriate in the EMR. I have also reviewed and updated the pt's allergies and current medications as appropriate.  Review of Systems: As above. Otherwise, full 12-system ROS was reviewed and all negative. Denies fever / chills, N/V, change in bowel /  bladder habits     Objective:   Physical Exam BP 145/101  Pulse 67  Temp(Src) 98.1 F (36.7 C) (Oral)  Wt 284 lb (128.822 kg) Gen: well-appearing adult male in NAD HEENT: [place]/AT, sclerae/conjunctivae clear, no lid lag, EOMI, PERRLA   MMM, posterior oropharynx clear, no cervical lymphadenopathy  neck supple with full ROM, no masses appreciated; thyroid not enlarged Cardio: RRR, no murmur appreciated; distal pulses intact/symmetric Pulm: CTAB, no wheezes, normal WOB  Abd: obese, firm but not tense / rigid, mildly distended (at baseline), BS+ Ext: warm/well-perfused, LE's mildly tender to palpation (at baseline)  Bilateral LE's with baseline venous stasis and overlying chronic skin changes MSK: strength 5/5 in LE extremities, no frank joint deformity/effusion  Strength 4+/5 in bilateral shoulders secondary to pain with active resisted ROM  Shoulders normal in appearance, though with diffuse bony and soft tissue tenderness  Markedly reduced ROM in all planes of shoulder movements bilaterally secondary to pain  Marked neck stiffness / spasm, L > R paraspinally  Positive special testing: Spurling's (bilat), Neer's and empty can (bilat), Hawkins (bilat) Neuro/Psych: alert/oriented, sensation grossly intact; normal gait/balance  mood relatively euthymic with congruent affect  Long-term memory seems intact; short-term with some ?mild impairment  No gross neurologic deficit     Assessment & Plan:

## 2013-09-04 ENCOUNTER — Telehealth: Payer: Self-pay | Admitting: Family Medicine

## 2013-09-04 NOTE — Telephone Encounter (Signed)
Called pt's wife to relay message that pt's bloodwork looks okay; normal kidney and liver function, normal thyroid. Pt can f/u as needed. Wife voiced understanding.  --CMS

## 2013-09-10 ENCOUNTER — Ambulatory Visit (INDEPENDENT_AMBULATORY_CARE_PROVIDER_SITE_OTHER): Payer: Medicare Other | Admitting: Family Medicine

## 2013-09-10 ENCOUNTER — Encounter: Payer: Self-pay | Admitting: Family Medicine

## 2013-09-10 VITALS — BP 142/91 | HR 93 | Temp 98.7°F | Ht 68.0 in | Wt 281.0 lb

## 2013-09-10 DIAGNOSIS — M25519 Pain in unspecified shoulder: Secondary | ICD-10-CM

## 2013-09-10 MED ORDER — METHYLPREDNISOLONE ACETATE 80 MG/ML IJ SUSP
80.0000 mg | Freq: Once | INTRAMUSCULAR | Status: AC
  Administered 2013-09-10: 80 mg via INTRA_ARTICULAR

## 2013-09-10 NOTE — Progress Notes (Signed)
   Subjective:    Patient ID: [name], male    DOB: January 14, 1966, 48 y.o.   MRN: [id number]  HPI: Pt presents to clinic for bilateral shoulder pain. See recent problem list note.  Bilateral shoulder pain - chronic managed by pain clinic, generally doing "okay" but shoulder pain worse for several months - has had bilateral shoulder injections in the past, but they only help for a few months at a time (last about 8 months ago)  - shoulder pain long-standing, worse with lifting arms up (adduction); L > R, has been worsening more for about 1 month  - described as an aching pain, worse through the day, with neck spasm / pain - interferes with sleep; sleeps with CPAP machine and typically sleeps on sides and back, and "can't get comfortable" - pt discussed with pain clinic  - has upcoming visit for back injections, but due to time constraints would not be able to get shoulder injections at the same time  - unable to be see for shoulder there for several weeks, as he has not been seen by them specifically for shoulders, before (essentially would be a new referral)  Review of Systems: As above. Denies fever / chills, N/V, change in bowel/bladder habits, SOB, chest pain, or abdominal pain.     Objective:   Physical Exam BP 142/91  Pulse 93  Temp(Src) 98.7 F (37.1 C) (Oral)  Ht 5\' 8"  (1.727 m)  Wt 281 lb (127.461 kg)  BMI 42.74 kg/m2 MSK: Shoulders normal in appearance bilaterally with no frank joint deformity/effusion   Shoulders and neck bilaterally with diffuse soft-tissue tendernes  Markedly reduced ROM in all planes of shoulder movements bilaterally secondary to pain   Marked neck stiffness / spasm, L > R paraspinally   Positive special testing: Spurling's (bilat), Neer's and empty can (bilat), Hawkins (bilat)  Overall very similar to last exam, with even more marked limitation of ROM, L > R Neurovascular: alert / oriented x4  Strength 4+/5 in bilateral shoulders through all  planes secondary to pain with active resisted ROM   Strength 5/5 in grip and bilateral LE extremities  Sensation grossly intact  Distal pulses present / symmetric bilaterally      Assessment & Plan:  See problem list note and procedure notes below.  Right Shoulder Joint Injection (subacromial) Consent obtained and verified. Signed copy to be scanned into EMR. Time-out conducted. Skin marked with end of pen. Noted no overlying erythema, induration, or other signs of local infection. Skin prepped in a sterile fashion with iodine and alcohol swabs. Topical analgesic spray: Ethyl chloride. Joint: right shoulder Needle: 1.5 inch, 25g Meds: Depo-Medrol 40 mg + 4 mL 1% lidocaine with epinephrine Completed without difficulty. Pt tolerated well Advised to call if fevers/chills, erythema, induration, drainage, or persistent bleeding.  Left Shoulder Joint Injection (subacromial) Consent obtained and verified. Signed copy to be scanned into EMR. Time-out conducted. Skin marked with end of pen. Noted no overlying erythema, induration, or other signs of local infection. Skin prepped in a sterile fashion with iodine and alcohol swabs. Topical analgesic spray: Ethyl chloride. Joint: left shoulder Needle: 1.5 inch, 25g Meds: Depo-Medrol 40 mg + 4 mL 1% lidocaine with epinephrine Completed without difficulty. Pt tolerated well Advised to call if fevers/chills, erythema, induration, drainage, or persistent bleeding.

## 2013-09-10 NOTE — Assessment & Plan Note (Signed)
Significant bilateral shoulder pain and reduced ROM, for several months, with improvement in the past with shoulder injections, with request for shoulder injections, today. Bilateral shoulder injections performed today with marked improvement in symptoms; see procedure notes in progress note. Less likely adhesive capsulitis given almost immediate and very marked improvement in pain and ROM within 5 minutes of injection, bilaterally.   Advised ice therapy to shoulders immediately after getting home from this visit and before bed, tonight, then daily as needed. Counseled on red flags to watch out for that would suggest local or systemic infection and counseled on likely side-effect of steroid causing elevated blood sugars. Plan to f/u as needed and instructed pt to f/u with pain clinic as scheduled for other pain issues.

## 2013-09-10 NOTE — Patient Instructions (Signed)
Thank you for coming in, today!  We injected both shoulders today. That injection should help with your pain for several weeks to several months. Ice both shoulders about 20 minutes when you get home, then another 20 minutes before bed tonight. Watch your blood sugars carefully for the next several weeks, as well. These shots can elevate your blood sugar.  If you have any of the following, call or come back to see us, or go to the emergency room: - Fever, chills, nausea, vomiting - Worse pain, redness, swelling, or bleeding around either injection site - Red, swollen, or painful skin around either joint - Other new symptoms in either shoulder  Follow up with your pain clinic appointment as scheduled. Come back to see me as you need. Please feel free to call with any questions or concerns at any time, at [phone]. --Dr. Casper HarrisonStreet

## 2013-09-17 IMAGING — CR DG CHEST 2V
2 series · 2 of 2 positions shown · non-contrast
Comparison: 01/18/2012

CLINICAL DATA: Shortness of breath and pain.

CHEST - 2 VIEW

[w chest pa]
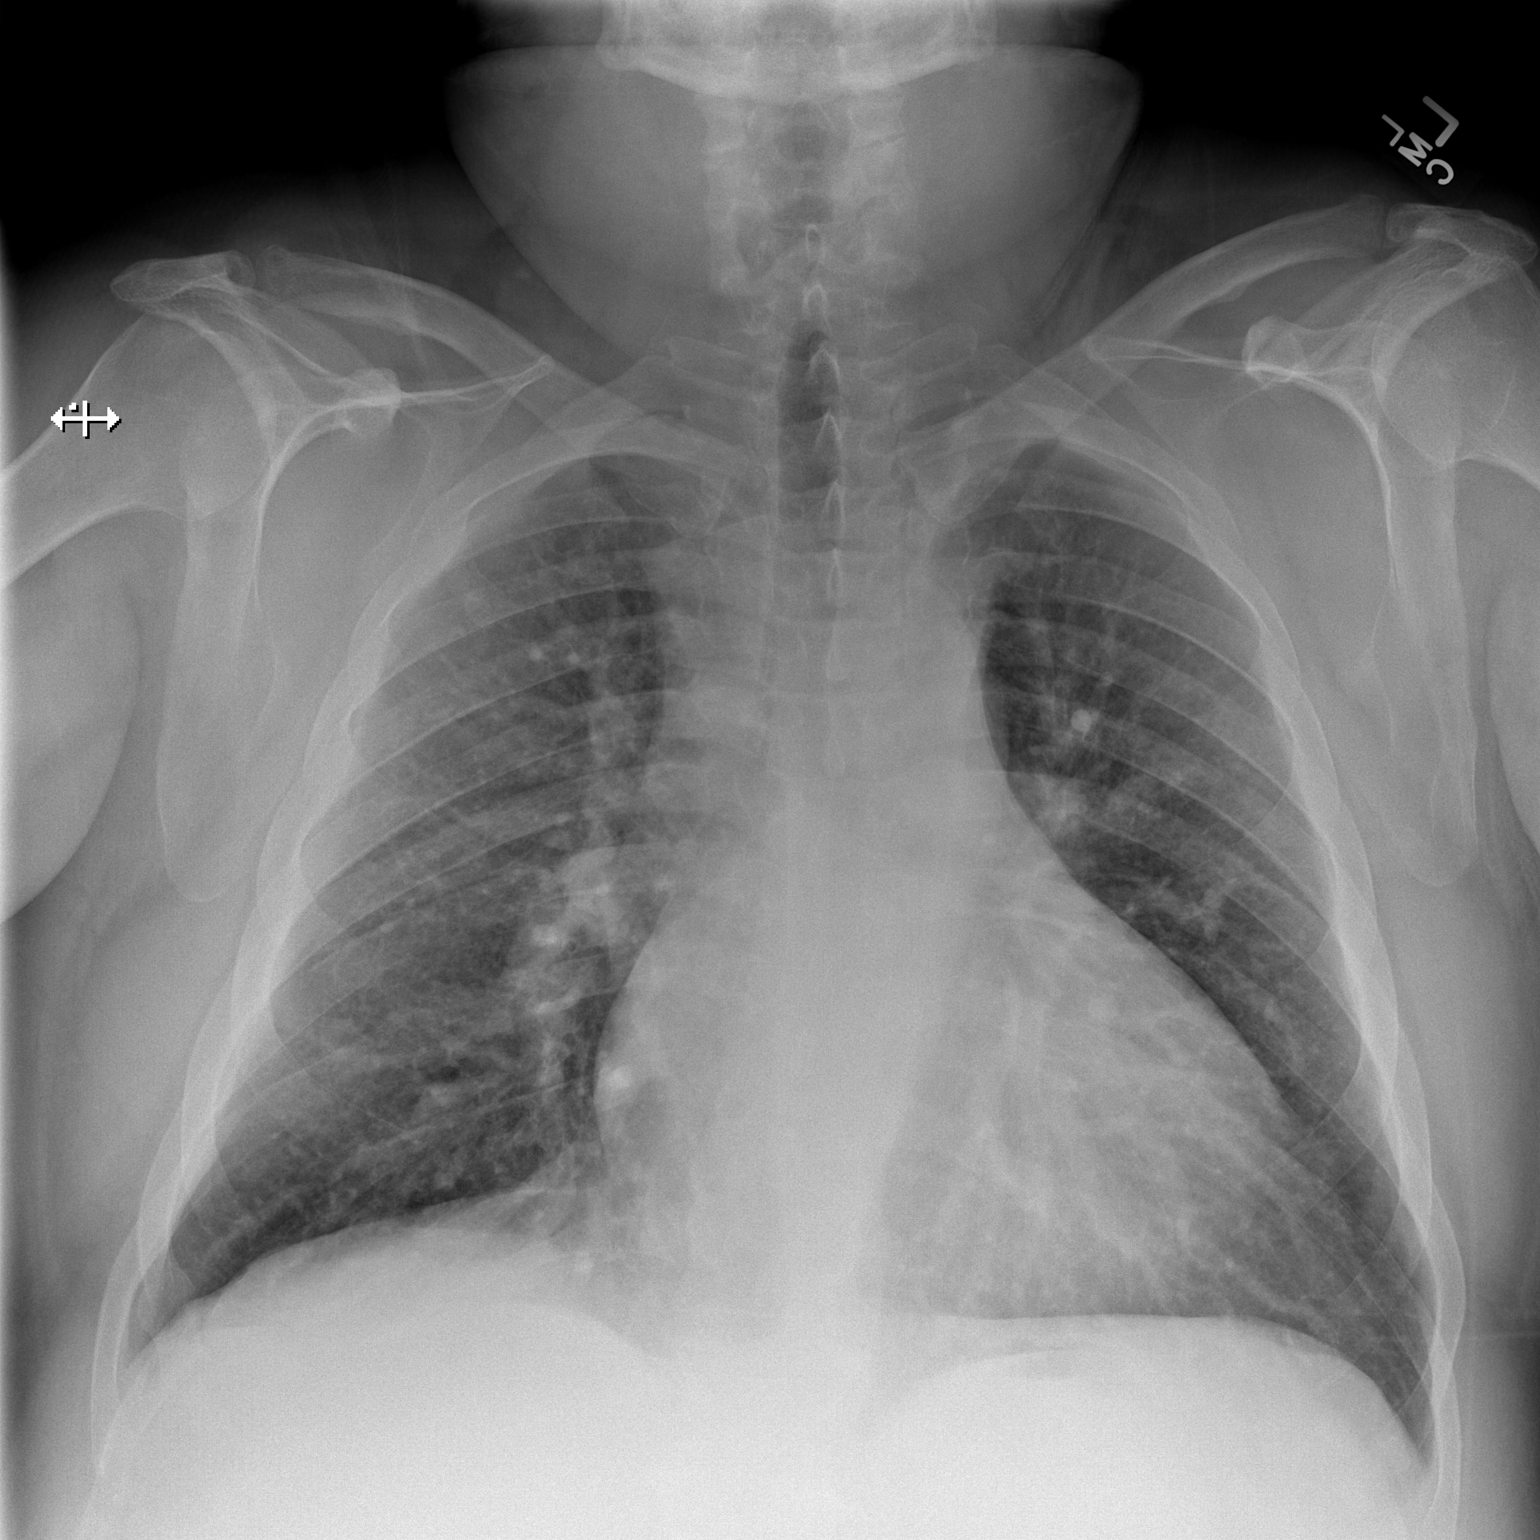

[w chest lat]
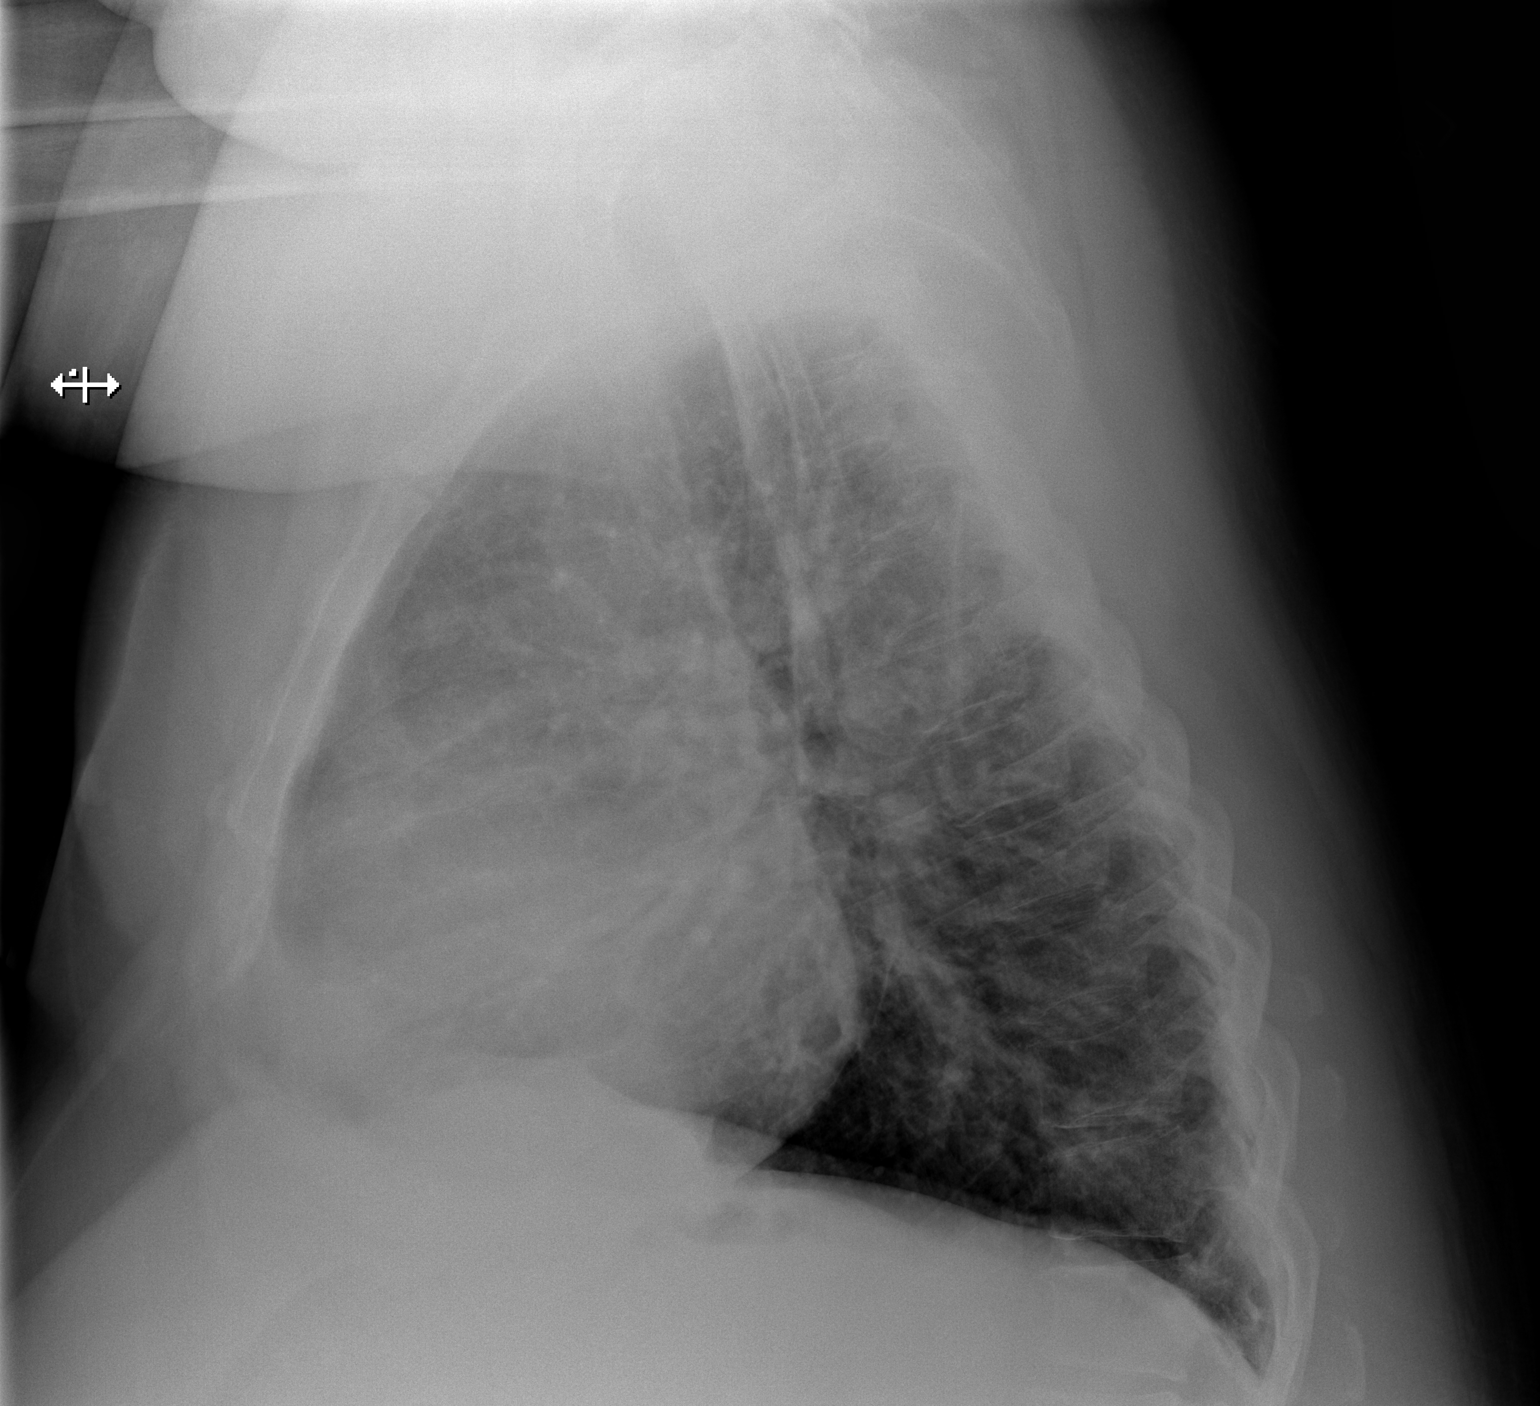

[2 of 2 positions shown; findings below may reference images not displayed]

FINDINGS: Cardiac enlargement with mild pulmonary vascular
congestion.  No edema or consolidation.  No blunting of
costophrenic angles.  No pneumothorax.  Emphysematous changes.  Old
bilateral rib fractures.  No significant change since previous
study.
IMPRESSION: Cardiac enlargement with mild pulmonary vascular congestion.  No
focal consolidation or edema.

## 2013-09-17 IMAGING — CR DG CHEST 1V PORT
1 series · 1 of 1 positions shown · non-contrast
Comparison: 01/31/2012

CLINICAL DATA: Short of breath.  Respiratory distress.

PORTABLE CHEST - 1 VIEW

[AP]
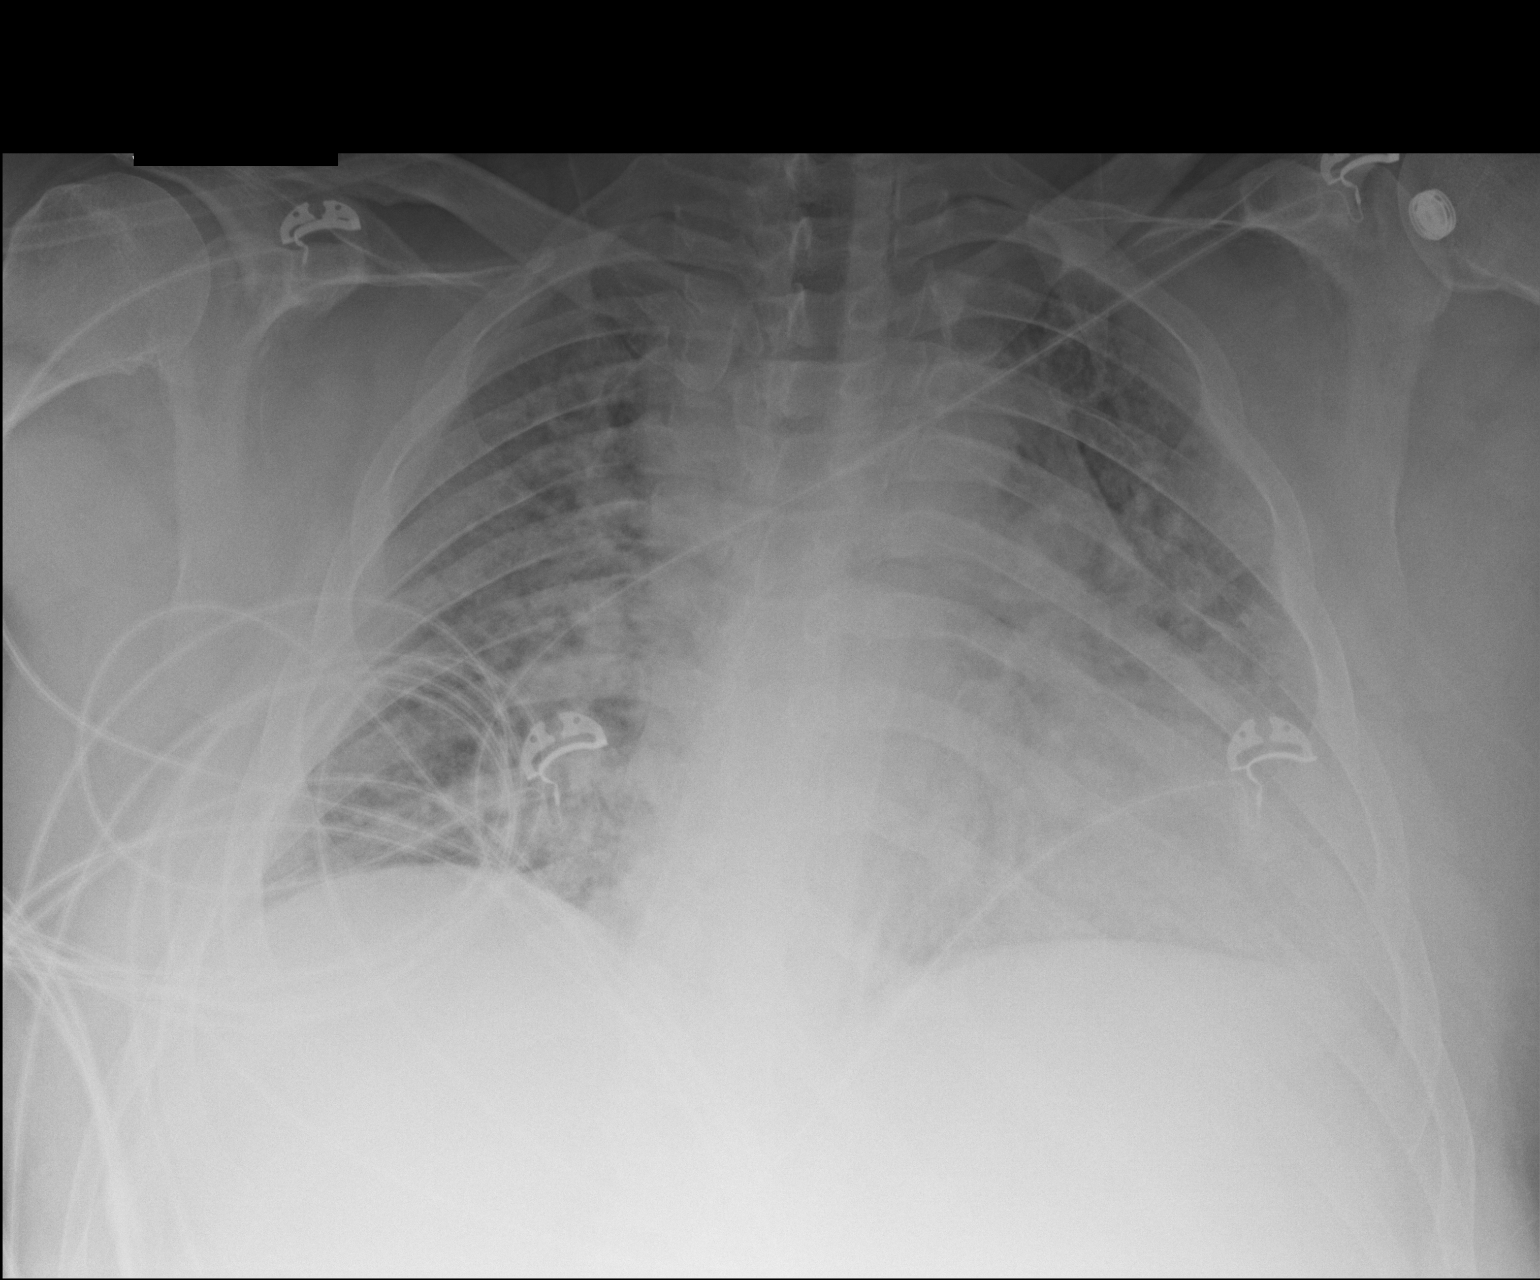

[1 of 1 positions shown; findings below may reference images not displayed]

FINDINGS: Decreased lung volumes are seen. Increased diffuse
interstitial prominence seen, and acute interstitial edema cannot
be excluded.  No focal consolidation identified.  Heart size
remains prominent.
IMPRESSION: Decreased lung volumes. Increased diffuse interstitial prominence;
acute interstitial edema cannot be excluded.

## 2013-09-17 IMAGING — CR DG LUMBAR SPINE COMPLETE 4+V
5 series · 5 of 5 positions shown · non-contrast
Comparison: 07/03/2004

CLINICAL DATA: The back pain radiating down the right leg after
fall two nights ago.

LUMBAR SPINE - COMPLETE 4+ VIEW

[t lumbar spine ap]
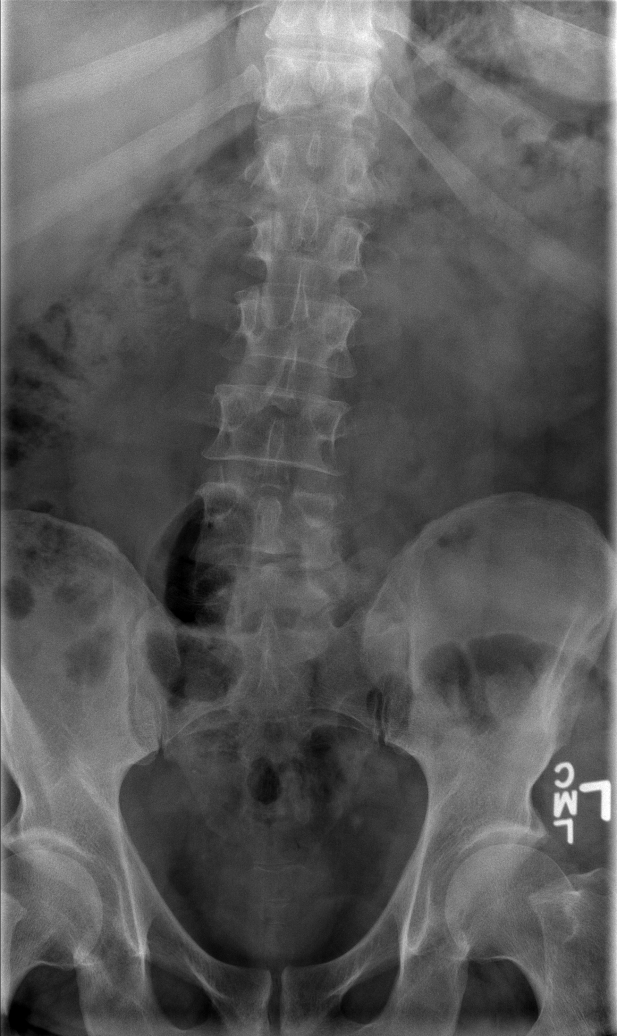

[t lumbar spine obl (1 of 2)]
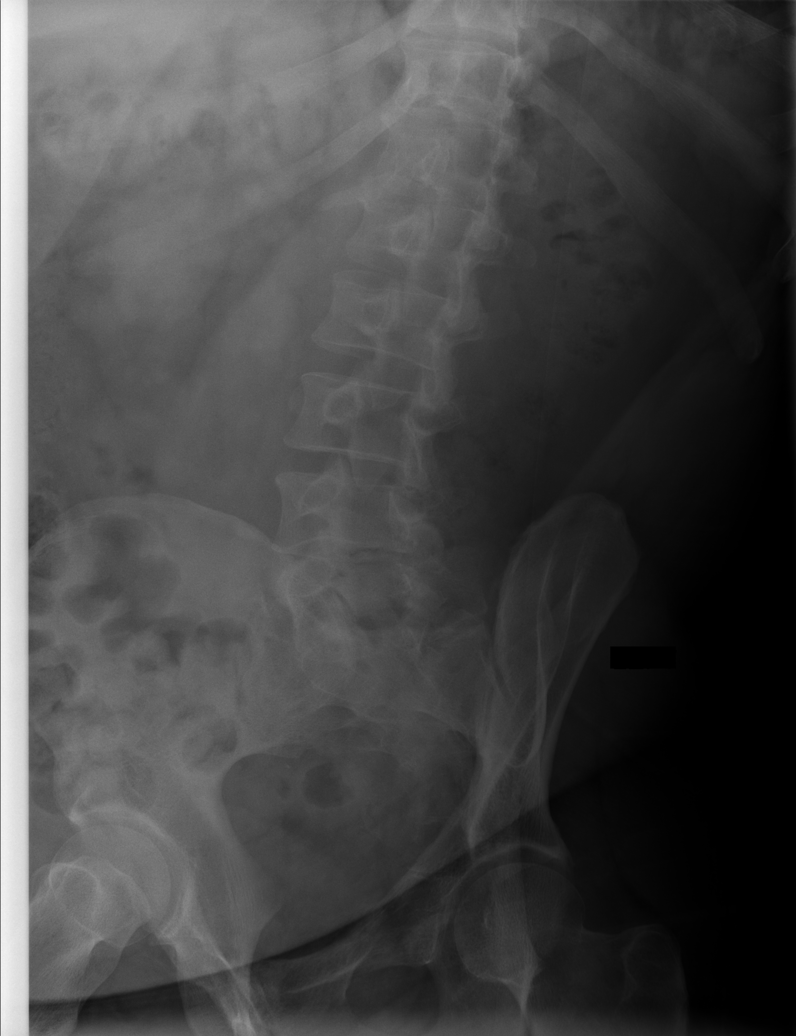

[t lumbar spine obl (2 of 2)]
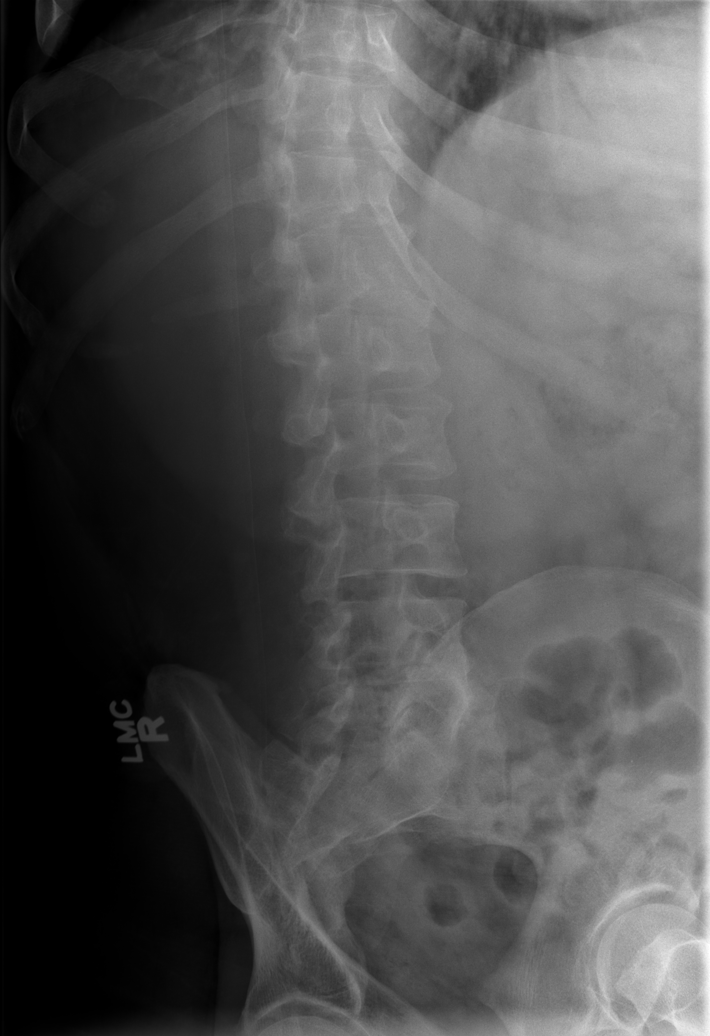

[t lumbar spine lat]
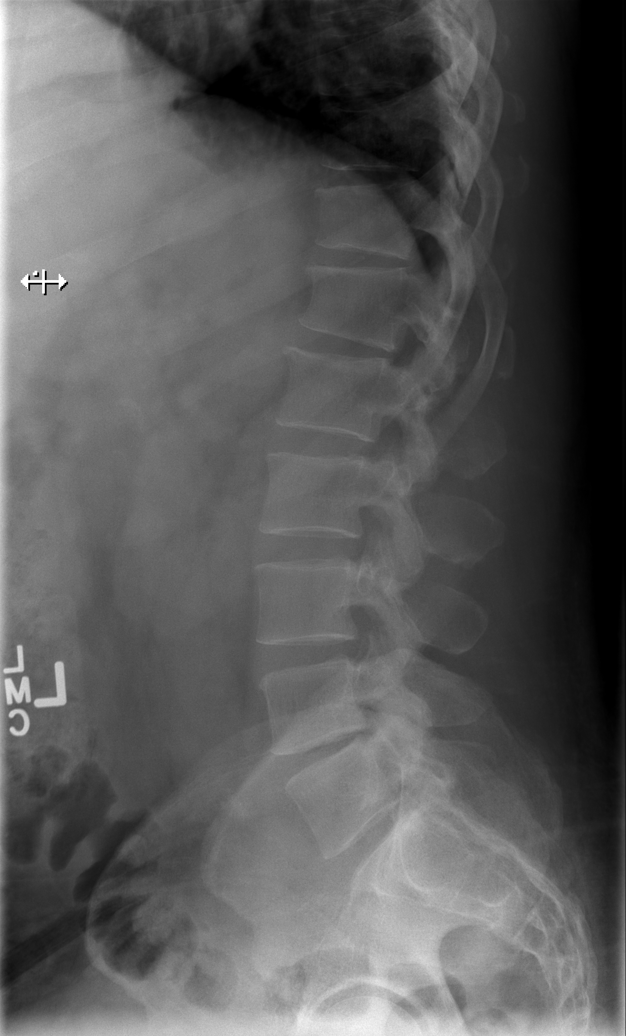

[t lumbar l-5 s-1 spot]
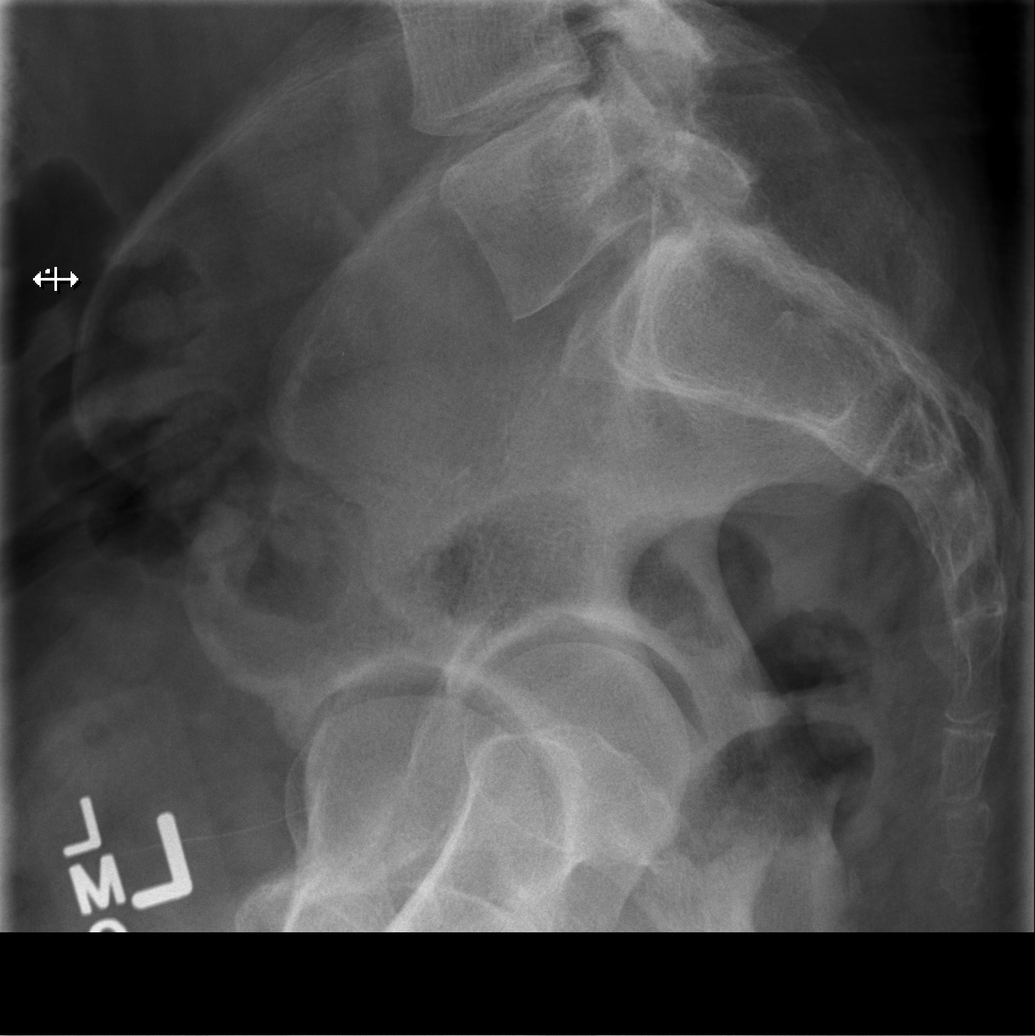

[5 of 5 positions shown; findings below may reference images not displayed]

FINDINGS: Five lumbar type vertebral bodies with partial
sacralization of L5.  Normal alignment of the lumbar vertebrae and
facet joints.  Degenerative narrowing of the L4-5 disc space.
Degenerative changes in the lower lumbar facet joints.  Mild
anterior wedging of T11 and T12, stable since previous study.  No
acute compression fractures are identified.  No focal bone lesion
or bone destruction.  Bone cortex and trabecular architecture
appear intact.
IMPRESSION: No displaced fractures identified.

## 2013-09-18 ENCOUNTER — Other Ambulatory Visit: Payer: Self-pay | Admitting: Family Medicine

## 2013-09-19 ENCOUNTER — Other Ambulatory Visit: Payer: Self-pay | Admitting: Family Medicine

## 2013-09-20 NOTE — Telephone Encounter (Signed)
Received refill requests for Novolog and Lantus, but refills were already ordered 4/7. Please call the patient to let him know he should be able to pick his prescriptions up at any time. Thanks. --CMS

## 2013-10-04 ENCOUNTER — Encounter (HOSPITAL_COMMUNITY): Payer: Self-pay | Admitting: Emergency Medicine

## 2013-10-04 ENCOUNTER — Emergency Department (HOSPITAL_COMMUNITY)
Admission: EM | Admit: 2013-10-04 | Discharge: 2013-10-04 | Disposition: A | Discharge status: Home / Self Care | Payer: Medicare Other | Attending: Emergency Medicine | Admitting: Emergency Medicine

## 2013-10-04 DIAGNOSIS — G589 Mononeuropathy, unspecified: Secondary | ICD-10-CM | POA: Insufficient documentation

## 2013-10-04 DIAGNOSIS — L02219 Cutaneous abscess of trunk, unspecified: Secondary | ICD-10-CM | POA: Insufficient documentation

## 2013-10-04 DIAGNOSIS — Z8709 Personal history of other diseases of the respiratory system: Secondary | ICD-10-CM | POA: Insufficient documentation

## 2013-10-04 DIAGNOSIS — G40909 Epilepsy, unspecified, not intractable, without status epilepticus: Secondary | ICD-10-CM | POA: Insufficient documentation

## 2013-10-04 DIAGNOSIS — Z880 Allergy status to penicillin: Secondary | ICD-10-CM | POA: Insufficient documentation

## 2013-10-04 DIAGNOSIS — L03319 Cellulitis of trunk, unspecified: Principal | ICD-10-CM

## 2013-10-04 DIAGNOSIS — Z87891 Personal history of nicotine dependence: Secondary | ICD-10-CM | POA: Insufficient documentation

## 2013-10-04 DIAGNOSIS — E119 Type 2 diabetes mellitus without complications: Secondary | ICD-10-CM | POA: Insufficient documentation

## 2013-10-04 DIAGNOSIS — M129 Arthropathy, unspecified: Secondary | ICD-10-CM | POA: Insufficient documentation

## 2013-10-04 DIAGNOSIS — Z7982 Long term (current) use of aspirin: Secondary | ICD-10-CM | POA: Insufficient documentation

## 2013-10-04 DIAGNOSIS — L02212 Cutaneous abscess of back [any part, except buttock]: Secondary | ICD-10-CM

## 2013-10-04 DIAGNOSIS — E669 Obesity, unspecified: Secondary | ICD-10-CM | POA: Insufficient documentation

## 2013-10-04 DIAGNOSIS — Z79899 Other long term (current) drug therapy: Secondary | ICD-10-CM | POA: Insufficient documentation

## 2013-10-04 DIAGNOSIS — Z794 Long term (current) use of insulin: Secondary | ICD-10-CM | POA: Insufficient documentation

## 2013-10-04 MED ORDER — CLINDAMYCIN HCL 300 MG PO CAPS: 300.0000 mg | ORAL_CAPSULE | Freq: Three times a day (TID) | ORAL | Status: DC

## 2013-10-04 NOTE — ED Provider Notes (Signed)
CSN: [id number]     Arrival date & time 10/04/13  [id number] History   First MD Initiated Contact with Patient 10/04/13 0606     Chief Complaint  Patient presents with  . Cyst     (Consider location/radiation/quality/duration/timing/severity/associated sxs/prior Treatment) HPI Patient presents to the emergency department with an abscess to the lower back region.  The patient, states, that he has had multiple episodes of this in the past.  The patient denies fever, nausea, vomiting, diarrhea, headache, blurred vision, weakness, dizziness, rash or syncope.  The patient, states, that he did not take any medications prior to arrival.  Patient, states he was seen here several months ago, for an abscess. Past Medical History  Diagnosis Date  . Neuropathy   . Obesity   . Collapsed lung     Left side  . OSA (obstructive sleep apnea)   . Diabetes mellitus   . Pain in back   . Environmental allergies   . Arthritis   . Seizures    Past Surgical History  Procedure Laterality Date  . Back surgery    . Appendectomy    . Eye surgery    . Fracture surgery    . Incision and drainage  02/02/2012        Family History  Problem Relation Age of Onset  . Diabetes Mother   . Asthma Mother   . COPD Mother   . Obesity Mother   . Diabetes Maternal Aunt   . Asthma Maternal Aunt   . Obesity Maternal Aunt   . Diabetes Maternal Uncle   . Asthma Maternal Uncle   . Obesity Maternal Uncle   . Diabetes Maternal Grandmother   . Asthma Maternal Grandmother   . Obesity Maternal Grandmother   . Diabetes Maternal Grandfather   . Asthma Maternal Grandfather   . Obesity Maternal Grandfather    History  Substance Use Topics  . Smoking status: Former Smoker -- 1.50 packs/day for 32 years    Types: Cigarettes    Quit date: 01/30/2012  . Smokeless tobacco: Never Used  . Alcohol Use: No    Review of Systems  All other systems negative except as documented in the HPI. All pertinent positives and negatives  as reviewed in the HPI.   Allergies  Penicillins and Metformin and related  Home Medications   Prior to Admission medications   Medication Sig Start Date End Date Taking? Authorizing Provider  aspirin 81 MG tablet Take 81 mg by mouth daily.    Historical Provider, MD  clindamycin (CLEOCIN) 300 MG capsule Take 1 capsule (300 mg total) by mouth 3 (three) times daily. 07/27/13   Jamesetta Orleanshristopher W Lennon Boutwell, PA-C  diclofenac sodium (VOLTAREN) 1 % GEL Apply 4 g topically 4 (four) times daily.    Historical Provider, MD  gabapentin (NEURONTIN) 600 MG tablet Take 1 tablet (600 mg total) by mouth 3 (three) times daily. 12/01/11   Everrett Coombeody Matthews, DO  HYDROcodone-acetaminophen (NORCO) 10-325 MG per tablet Take 1 tablet by mouth every 6 (six) hours as needed for pain. 04/24/12   Everrett Coombeody Matthews, DO  Insulin Syringe-Needle U-100 29G X 7/16" 0.3 ML MISC Use as directed for insulin injection 02/07/12   Everrett Coombeody Matthews, DO  Lancets MISC Please provide lancets to use with glucometer.  Testing frequency qam. 02/07/12   Everrett Coombeody Matthews, DO  LANTUS 100 UNIT/ML injection INJECT 0.36 MLS (36 UNITS TOTAL) INTO THE SKIN DAILY. 09/18/13   Stephanie Couphristopher M Street, MD  Multiple Vitamin (MULTIVITAMIN WITH MINERALS)  TABS tablet Take 1 tablet by mouth daily.    Historical Provider, MD  NOVOLOG 100 UNIT/ML injection INJECT 15 UNITS INTO THE SKIN 3 (THREE) TIMES DAILY BEFORE MEALS. 09/18/13   Stephanie Couphristopher M Street, MD   BP 139/92  Pulse 95  Resp 18  SpO2 97% Physical Exam  Nursing note and vitals reviewed. Constitutional: He appears well-developed and well-nourished.  HENT:  Head: Normocephalic and atraumatic.  Pulmonary/Chest: Effort normal.  Musculoskeletal:       Back:  Skin: Skin is warm and dry.    ED Course  Procedures (including critical care time) INCISION AND DRAINAGE Performed by: Carlyle Dollyhristopher W Collis Thede Consent: Verbal consent obtained. Risks and benefits: risks, benefits and alternatives were discussed Type:  abscess  Body area:mid lumbar region  Anesthesia: local infiltration  Incision was made with a scalpel.  Local anesthetic: lidocaine 2% wo epinephrine  Anesthetic total: 6 ml  Complexity: complex Blunt dissection to break up loculations  Drainage: purulent  Drainage amount: large  Packing material: none  Patient tolerance: Patient tolerated the procedure well with no immediate complications.  The patient has no systemic signs of infection at this point. I advised him close follow up with his PCP. Told to return here as needed.      Carlyle Dollyhristopher W Lesleigh Hughson, PA-C 10/05/13 [phone]

## 2013-10-04 NOTE — Discharge Instructions (Signed)
Use heat on the area. Keep area clean and dry. Soak in a warm bath. Return here for any worsening in your condition. Follow up with your primary care doctor.

## 2013-10-04 NOTE — ED Notes (Addendum)
Pt c/o pilonidal cyst increasing in size for last several days. Pt is under contract with pain clinic.

## 2013-10-07 NOTE — ED Provider Notes (Signed)
Medical screening examination/treatment/procedure(s) were performed by non-physician practitioner and as supervising physician I was immediately available for consultation/collaboration.   EKG Interpretation None        Junius ArgyleForrest S Lorely Bubb, MD 10/07/13 1021

## 2013-10-11 ENCOUNTER — Other Ambulatory Visit: Payer: Self-pay | Admitting: Family Medicine

## 2013-11-09 ENCOUNTER — Telehealth: Payer: Self-pay | Admitting: Family Medicine

## 2013-11-09 NOTE — Telephone Encounter (Signed)
[name] need supplies for CPAP machine and need rx written and sent to [facility].

## 2013-11-09 NOTE — Telephone Encounter (Signed)
Forward to PCP for Rx.[name]  

## 2013-11-10 NOTE — Telephone Encounter (Signed)
Please call pt to see exactly what he needs Rx to say. He can come in for an appointment, if he needs -- if he hasn't had a sleep study, I don't know if his insurance will pay for a CPAP / supplies / etc. Thanks. --CMS

## 2013-11-12 NOTE — Telephone Encounter (Signed)
Patient calls back, Patient had sleep study done 2 years ago (please see Procedure tab for sleep study documents). Patient states he needs a RX for new filters for machine, a new mask due to his mask not fitting correctly and tubing. Please call with any other questions.

## 2013-11-12 NOTE — Telephone Encounter (Signed)
Called pt. LMVM for pt to call back. (I do not see sleep study in chart. Pt may need appt to clarify order.) Waiting for call back. Arlyss Repress

## 2013-11-12 NOTE — Telephone Encounter (Signed)
Will fwd to Dr.Street for order(to send to [facility]). Thanks. Arlyss Repress

## 2013-11-13 NOTE — Telephone Encounter (Signed)
Hand-written Rx for "CPAP machine mask, tubing, and filters" faxed to [facility] (called them to clarify and the Rx did not, per representative, need to be more specific than that). [facility] should be in contact with pt to deliver or have supplies picked up by pt. If he calls back, please let him know of the above. Thanks. --CMS

## 2013-12-20 ENCOUNTER — Encounter: Payer: Self-pay | Admitting: Family Medicine

## 2013-12-20 ENCOUNTER — Ambulatory Visit (INDEPENDENT_AMBULATORY_CARE_PROVIDER_SITE_OTHER): Payer: Medicare Other | Admitting: Family Medicine

## 2013-12-20 VITALS — BP 136/83 | HR 73 | Temp 98.2°F | Wt 278.0 lb

## 2013-12-20 DIAGNOSIS — IMO0002 Reserved for concepts with insufficient information to code with codable children: Secondary | ICD-10-CM

## 2013-12-20 DIAGNOSIS — Z713 Dietary counseling and surveillance: Secondary | ICD-10-CM

## 2013-12-20 DIAGNOSIS — E785 Hyperlipidemia, unspecified: Secondary | ICD-10-CM

## 2013-12-20 DIAGNOSIS — E1165 Type 2 diabetes mellitus with hyperglycemia: Secondary | ICD-10-CM

## 2013-12-20 DIAGNOSIS — E669 Obesity, unspecified: Secondary | ICD-10-CM

## 2013-12-20 DIAGNOSIS — IMO0001 Reserved for inherently not codable concepts without codable children: Secondary | ICD-10-CM

## 2013-12-20 LAB — COMPREHENSIVE METABOLIC PANEL
ALK PHOS: 69 U/L (ref 39–117)
ALT: 24 U/L (ref 0–53)
AST: 14 U/L (ref 0–37)
Albumin: 4.2 g/dL (ref 3.5–5.2)
BILIRUBIN TOTAL: 0.4 mg/dL (ref 0.2–1.2)
BUN: 19 mg/dL (ref 6–23)
CO2: 27 mEq/L (ref 19–32)
CREATININE: 0.88 mg/dL (ref 0.50–1.35)
Calcium: 9.5 mg/dL (ref 8.4–10.5)
Chloride: 100 mEq/L (ref 96–112)
Glucose, Bld: 130 mg/dL — ABNORMAL HIGH (ref 70–99)
Potassium: 4.3 mEq/L (ref 3.5–5.3)
SODIUM: 136 meq/L (ref 135–145)
TOTAL PROTEIN: 6.8 g/dL (ref 6.0–8.3)

## 2013-12-20 LAB — LIPID PANEL
CHOL/HDL RATIO: 6.8 ratio
Cholesterol: 243 mg/dL — ABNORMAL HIGH (ref 0–200)
HDL: 36 mg/dL — AB (ref >39)
LDL CALC: 145 mg/dL — AB (ref 0–99)
Triglycerides: 310 mg/dL — ABNORMAL HIGH (ref <150)
VLDL: 62 mg/dL — AB (ref 0–40)

## 2013-12-20 LAB — POCT GLYCOSYLATED HEMOGLOBIN (HGB A1C): Hemoglobin A1C: 7.3

## 2013-12-20 NOTE — Assessment & Plan Note (Signed)
A: Due for A1c, up from 7.1 to 7.3 today, but pt actively making great therapeutic lifestyle changes and has excellent compliance with medications.  P: Continue current meds. F/u formally for DM in 3 months, or as needed. Also consider discussing with Dr. Raymondo BandKoval vs formal visit in pharmacy clinic for additional newer agents to see if A1c can be dropped another point or so.

## 2013-12-20 NOTE — Patient Instructions (Addendum)
Thank you for coming in, today!  We will check your labs while you are here today. I will call or send you a letter with the results.  I think a colonic or a colon "washout" might help with your issues. Call the office below and ask them about getting a cleansing. They may or may not take insurance or have payment plans.  If you can't see them for whatever reason, try a home washout. There is a couple of ways to do this.  One way is to use magnesium citrate (over the counter). This will definitely cause bowel movements probably for a least several hours if not a day or two. Don't use mag citrate more than twice a week and make sure you drink plenty of fluids after you do it.  Another way is to take MiraLAX (also over the counter). This is usually done with higher-than-normal doses. The GI doctors are the ones that usually do this. If the other things we talked about don't help, then I might contact one GI doctors and ask what they prescribe.  Please feel free to call with any questions or concerns at any time, at [phone]. --Dr. Casper HarrisonStreet   [facility] [address] [place], [place] [address] [phone] . Fax [phone]

## 2013-12-20 NOTE — Progress Notes (Signed)
   Subjective:    Patient ID: [name], male    DOB: [birth year], 48 y.o.   MRN: [id number]  HPI: Pt presents to clinic for concern about his weight and possibly needing a colonoscopy or colonic cleansing. He is concerned that he has only lost 3 pounds in 1 month but he has radically changed his diet. He eats very little bread, skinless/boneless grilled chicken, lots of salad without cheese or salad dressing or croutons, no fast food (other than grilled chicken from [facility] with half or none of the bread) or fried food, no sodas, only one drink of alcohol in several months, very little red meat, and drinking lots of water. His blood sugar typically runs around 140 at home and he is compliant insulin therapy (Lantus 40 units qAM and Novolog 15 units with meals). He has no bowel symptoms such as constipation, diarrhea, or abdominal pain, or urinary symptoms.  He is concerned that a colonoscopy/colonic cleansing my help as he had a friend in a similar situation did much better after a clean-out / colonoscopy. His friend stated he had an "impacted colon" and the cleanout / scope helped him start losing weight after that. He has no family history of colon cancer.  DM / HLD - Of note, pt is due for A1c check and lipid panel. He reports compliance with medications, as above. He denies frank hypoglycemic symptoms.  Review of Systems: As above.     Objective:   Physical Exam BP 136/83  Pulse 73  Temp(Src) 98.2 F (36.8 C) (Oral)  Wt 278 lb (126.1 kg) Gen: well-appearing adult male in NAD HEENT: [place]/AT, EOMI, MMM Cardio: RRR, no murmur Pulm: CTAB, no wheezes Abd: BS+, firm / full without tenderness to palpation and not hard (able to feel difference in firmness with pt bearing down)  No frank masses appreciated, though abdominal obesity limits exam Ext: warm, well-perfused, no LE edema Skin: numerous tattoos but no frank suspicious lesions noted     Assessment & Plan:  Greater than 25  minutes was spent in face-to-face time with this patient, more than half of which was devoted to counseling and development/coordination of care and treatment planning regarding weight loss and bowel cleanout. See problem list notes.

## 2013-12-20 NOTE — Assessment & Plan Note (Signed)
Greater than 25 minutes was spent in face-to-face time with this patient, more than half of which was devoted to counseling and development/coordination of care and treatment planning regarding weight loss and bowel-cleanout.  A: Main thrust of today's visit. Pt with apparent poor weight loss despite significant lifestyle changes / improvements. Concern by pt that he may have "impacted stool" though he has no significant GI complaints. Abdomen is definitely obese and full to palpation, and firm but not hard, and completely nontender. Discussed with pt that he is not yet at the age we would recommend colonoscopy for cancer screening, but that bowel prep / clean-out / colonic cleansing / etc may be of benefit.  P: Considered various options but after brief internet search provided pt with number of local colonic cleansing clinic with instructions to contact them; if pt unable to afford this for insurance or out-of-pocket limitation reasons, advised him that he may try mag citrate OTC. If these measures are unsuccessful, may contact GI doctor(s) for recommendation about what they prescribe for colonoscopy cleanout, and / or may consider formal referral. Regardless, continue excellent lifestyle changes and f/u as needed.

## 2013-12-20 NOTE — Assessment & Plan Note (Signed)
Last lipid panel 5 years ago; CMP 4 months ago unremarkable. Previously with HLD, but uncertain if pt ever was on a statin, as he had not yet made significan therapeutic lifestyle improvements at that time. Lipid panel and CMP drawn today. Will f/u labs as appropriate and plan to see back in about 3 months.

## 2013-12-25 ENCOUNTER — Encounter: Payer: Self-pay | Admitting: Family Medicine

## 2013-12-25 ENCOUNTER — Other Ambulatory Visit: Payer: Self-pay | Admitting: Family Medicine

## 2014-01-27 ENCOUNTER — Other Ambulatory Visit: Payer: Self-pay | Admitting: Family Medicine

## 2014-03-20 ENCOUNTER — Other Ambulatory Visit: Payer: Self-pay | Admitting: Family Medicine

## 2014-05-28 ENCOUNTER — Emergency Department (HOSPITAL_COMMUNITY)
Admission: EM | Admit: 2014-05-28 | Discharge: 2014-05-28 | Disposition: A | Discharge status: Home / Self Care | Payer: Medicare Other | Attending: Emergency Medicine | Admitting: Emergency Medicine

## 2014-05-28 ENCOUNTER — Encounter (HOSPITAL_COMMUNITY): Payer: Self-pay | Admitting: Emergency Medicine

## 2014-05-28 DIAGNOSIS — Z79899 Other long term (current) drug therapy: Secondary | ICD-10-CM | POA: Diagnosis not present

## 2014-05-28 DIAGNOSIS — G629 Polyneuropathy, unspecified: Secondary | ICD-10-CM | POA: Diagnosis not present

## 2014-05-28 DIAGNOSIS — E119 Type 2 diabetes mellitus without complications: Secondary | ICD-10-CM | POA: Insufficient documentation

## 2014-05-28 DIAGNOSIS — M1990 Unspecified osteoarthritis, unspecified site: Secondary | ICD-10-CM | POA: Insufficient documentation

## 2014-05-28 DIAGNOSIS — Z87891 Personal history of nicotine dependence: Secondary | ICD-10-CM | POA: Diagnosis not present

## 2014-05-28 DIAGNOSIS — K602 Anal fissure, unspecified: Secondary | ICD-10-CM | POA: Diagnosis not present

## 2014-05-28 DIAGNOSIS — Z7982 Long term (current) use of aspirin: Secondary | ICD-10-CM | POA: Diagnosis not present

## 2014-05-28 DIAGNOSIS — E669 Obesity, unspecified: Secondary | ICD-10-CM | POA: Insufficient documentation

## 2014-05-28 DIAGNOSIS — Z792 Long term (current) use of antibiotics: Secondary | ICD-10-CM | POA: Insufficient documentation

## 2014-05-28 DIAGNOSIS — Z794 Long term (current) use of insulin: Secondary | ICD-10-CM | POA: Diagnosis not present

## 2014-05-28 DIAGNOSIS — Z880 Allergy status to penicillin: Secondary | ICD-10-CM | POA: Insufficient documentation

## 2014-05-28 DIAGNOSIS — K625 Hemorrhage of anus and rectum: Secondary | ICD-10-CM | POA: Diagnosis present

## 2014-05-28 DIAGNOSIS — Z791 Long term (current) use of non-steroidal anti-inflammatories (NSAID): Secondary | ICD-10-CM | POA: Diagnosis not present

## 2014-05-28 MED ORDER — DOCUSATE SODIUM 100 MG PO CAPS: 100.0000 mg | ORAL_CAPSULE | Freq: Two times a day (BID) | ORAL | Status: DC

## 2014-05-28 NOTE — Discharge Instructions (Signed)
Anal Fissure, Adult An anal fissure is a small tear or crack in the skin around the anus. Bleeding from a fissure usually stops on its own within a few minutes. However, bleeding will often reoccur with each bowel movement until the crack heals.  CAUSES   Passing large, hard stools.  Frequent diarrheal stools.  Constipation.  Inflammatory bowel disease (Crohn's disease or ulcerative colitis).  Infections.  Anal sex. SYMPTOMS   Small amounts of blood seen on your stools, on toilet paper, or in the toilet after a bowel movement.  Rectal bleeding.  Painful bowel movements.  Itching or irritation around the anus. DIAGNOSIS Your caregiver will examine the anal area. An anal fissure can usually be seen with careful inspection. A rectal exam may be performed and a short tube (anoscope) may be used to examine the anal canal. TREATMENT   You may be instructed to take fiber supplements. These supplements can soften your stool to help make bowel movements easier.  Sitz baths may be recommended to help heal the tear. Do not use soap in the sitz baths.  A medicated cream or ointment may be prescribed to lessen discomfort. HOME CARE INSTRUCTIONS   Maintain a diet high in fruits, whole grains, and vegetables. Avoid constipating foods like bananas and dairy products.  Take sitz baths as directed by your caregiver.  Drink enough fluids to keep your urine clear or pale yellow.  Only take over-the-counter or prescription medicines for pain, discomfort, or fever as directed by your caregiver. Do not take aspirin as this may increase bleeding.  Do not use ointments containing numbing medications (anesthetics) or hydrocortisone. They could slow healing. SEEK MEDICAL CARE IF:   Your fissure is not completely healed within 3 days.  You have further bleeding.  You have a fever.  You have diarrhea mixed with blood.  You have pain.  Your problem is getting worse rather than  better. MAKE SURE YOU:   Understand these instructions.  Will watch your condition.  Will get help right away if you are not doing well or get worse. Document Released: 05/31/2005 Document Revised: 08/23/2011 Document Reviewed: 11/15/2010 Exit[employer] Patient Information 2015 [employer]. This information is not intended to replace advice given to you by your health care provider. Make sure you discuss any questions you have with your health care provider.  

## 2014-05-28 NOTE — ED Notes (Signed)
Patient denies having any abd pain or rectal pain. Pt states he feels like he has to have a bowel movement, pass gas. Noticed in the toilet it was dark red, and bright red when wiping with a tissue.

## 2014-05-28 NOTE — ED Notes (Signed)
Pt states he went to the restroom tonight passed gas and thought he had to have a bowel movement but when he got up the commode had dark blood in it and when he wiped he had bright red blood on the tissue   Pt states he has only had the one episode tonight around 1 am  Pt denies pain

## 2014-06-01 NOTE — ED Provider Notes (Signed)
CSN: [id number]     Arrival date & time 05/28/14  0120 History   First MD Initiated Contact with Patient 05/28/14 0150     Chief Complaint  Patient presents with  . Rectal Bleeding      HPI  Patient presents for evaluation of anal bleeding. States he had a sudden urge to have a bowel movement. He says he heard the toilet. Did not have difficulty passing bowel movement. However, he states that "things happen fast". When he wiped he states he felt a stinging and burning and there was some bright red blood on the tissue. He presents for evaluation. Other episodes of bleeding or dark stools. No impactions or constipation or large hard stools.  Past Medical History  Diagnosis Date  . Neuropathy   . Obesity   . Collapsed lung     Left side  . OSA (obstructive sleep apnea)   . Diabetes mellitus   . Pain in back   . Environmental allergies   . Arthritis   . Seizures    Past Surgical History  Procedure Laterality Date  . Back surgery    . Appendectomy    . Eye surgery    . Fracture surgery    . Incision and drainage  02/02/2012       . Hand surgery     Family History  Problem Relation Age of Onset  . Diabetes Mother   . Asthma Mother   . COPD Mother   . Obesity Mother   . Diabetes Maternal Aunt   . Asthma Maternal Aunt   . Obesity Maternal Aunt   . Diabetes Maternal Uncle   . Asthma Maternal Uncle   . Obesity Maternal Uncle   . Diabetes Maternal Grandmother   . Asthma Maternal Grandmother   . Obesity Maternal Grandmother   . Diabetes Maternal Grandfather   . Asthma Maternal Grandfather   . Obesity Maternal Grandfather    History  Substance Use Topics  . Smoking status: Former Smoker -- 1.50 packs/day for 32 years    Types: Cigarettes    Quit date: 01/30/2012  . Smokeless tobacco: Never Used  . Alcohol Use: Yes     Comment: occ    Review of Systems  Constitutional: Negative for fever, chills, diaphoresis, appetite change and fatigue.  HENT: Negative for mouth  sores, sore throat and trouble swallowing.   Eyes: Negative for visual disturbance.  Respiratory: Negative for cough, chest tightness, shortness of breath and wheezing.   Cardiovascular: Negative for chest pain.  Gastrointestinal: Positive for anal bleeding. Negative for nausea, vomiting, abdominal pain, diarrhea and abdominal distention.  Endocrine: Negative for polydipsia, polyphagia and polyuria.  Genitourinary: Negative for dysuria, frequency and hematuria.  Musculoskeletal: Negative for gait problem.  Skin: Negative for color change, pallor and rash.  Neurological: Negative for dizziness, syncope, light-headedness and headaches.  Hematological: Does not bruise/bleed easily.  Psychiatric/Behavioral: Negative for behavioral problems and confusion.      Allergies  Penicillins and Metformin and related  Home Medications   Prior to Admission medications   Medication Sig Start Date End Date Taking? Authorizing Provider  ACCU-CHEK AVIVA PLUS test strip TEST BLOOD SUGARS 3 TIMES DAILY 03/20/14   Stephanie Couphristopher M Street, MD  aspirin 81 MG tablet Take 81 mg by mouth daily.    Historical Provider, MD  clindamycin (CLEOCIN) 300 MG capsule Take 1 capsule (300 mg total) by mouth 3 (three) times daily. 10/04/13   Carlyle Dollyhristopher W Lawyer, PA-C  diclofenac sodium (VOLTAREN)  1 % GEL Apply 4 g topically 4 (four) times daily.    Historical Provider, MD  docusate sodium (COLACE) 100 MG capsule Take 1 capsule (100 mg total) by mouth every 12 (twelve) hours. 05/28/14   Rolland PorterMark Karstyn Birkey, MD  gabapentin (NEURONTIN) 600 MG tablet Take 1 tablet (600 mg total) by mouth 3 (three) times daily. 12/01/11   Everrett Coombeody Matthews, DO  HYDROcodone-acetaminophen (NORCO) 10-325 MG per tablet Take 1 tablet by mouth every 6 (six) hours as needed for pain. 04/24/12   Everrett Coombeody Matthews, DO  Insulin Syringe-Needle U-100 29G X 7/16" 0.3 ML MISC Use as directed for insulin injection 02/07/12   Everrett Coombeody Matthews, DO  ketoconazole (NIZORAL) 2 % shampoo  APPLY TOPICALLY DAILY. 10/11/13   Stephanie Couphristopher M Street, MD  Lancets MISC Please provide lancets to use with glucometer.  Testing frequency qam. 02/07/12   Everrett Coombeody Matthews, DO  LANTUS 100 UNIT/ML injection INJECT 0.36 MLS (36 UNITS TOTAL) INTO THE SKIN DAILY. 01/28/14   Stephanie Couphristopher M Street, MD  Multiple Vitamin (MULTIVITAMIN WITH MINERALS) TABS tablet Take 1 tablet by mouth daily.    Historical Provider, MD  NOVOLOG 100 UNIT/ML injection INJECT 15 UNITS INTO THE SKIN 3 (THREE) TIMES DAILY BEFORE MEALS. 12/25/13   Stephanie Couphristopher M Street, MD   BP 129/72 mmHg  Pulse 75  Temp(Src) 98.7 F (37.1 C) (Oral)  Resp 20  Ht 5\' 9"  (1.753 m)  Wt 255 lb (115.667 kg)  BMI 37.64 kg/m2  SpO2 97% Physical Exam  Constitutional: He is oriented to person, place, and time. He appears well-developed and well-nourished. No distress.  HENT:  Head: Normocephalic.  Eyes: Conjunctivae are normal. Pupils are equal, round, and reactive to light. No scleral icterus.  Neck: Normal range of motion. Neck supple. No thyromegaly present.  Cardiovascular: Normal rate and regular rhythm.  Exam reveals no gallop and no friction rub.   No murmur heard. Pulmonary/Chest: Effort normal and breath sounds normal. No respiratory distress. He has no wheezes. He has no rales.  Abdominal: Soft. Bowel sounds are normal. He exhibits no distension. There is no tenderness. There is no rebound.  Genitourinary:     Musculoskeletal: Normal range of motion.  Neurological: He is alert and oriented to person, place, and time.  Skin: Skin is warm and dry. No rash noted.  Psychiatric: He has a normal mood and affect. His behavior is normal.    ED Course  Procedures (including critical care time) Labs Review Labs Reviewed - No data to display  Imaging Review No results found.   EKG Interpretation None      MDM   Final diagnoses:  Anal fissure    Fissure noted on exam. No hemorrhoids noted. Think he is safe and appropriate for  outpatient treatment. Plan sitz baths when necessary. Stool softener with Colace.    Rolland PorterMark Aws Shere, MD 06/01/14 [phone]

## 2014-06-12 ENCOUNTER — Other Ambulatory Visit: Payer: Self-pay | Admitting: Family Medicine

## 2014-07-04 ENCOUNTER — Encounter: Payer: Self-pay | Admitting: Family Medicine

## 2014-07-04 ENCOUNTER — Ambulatory Visit (INDEPENDENT_AMBULATORY_CARE_PROVIDER_SITE_OTHER): Payer: Medicare Other | Admitting: Family Medicine

## 2014-07-04 VITALS — BP 145/84 | HR 67 | Temp 98.1°F | Ht 69.0 in

## 2014-07-04 DIAGNOSIS — M25512 Pain in left shoulder: Secondary | ICD-10-CM

## 2014-07-04 DIAGNOSIS — IMO0002 Reserved for concepts with insufficient information to code with codable children: Secondary | ICD-10-CM

## 2014-07-04 DIAGNOSIS — M25511 Pain in right shoulder: Secondary | ICD-10-CM

## 2014-07-04 DIAGNOSIS — E1165 Type 2 diabetes mellitus with hyperglycemia: Secondary | ICD-10-CM | POA: Diagnosis not present

## 2014-07-04 LAB — POCT GLYCOSYLATED HEMOGLOBIN (HGB A1C): Hemoglobin A1C: 6.2

## 2014-07-04 MED ORDER — INSULIN LISPRO 100 UNIT/ML ~~LOC~~ SOLN: 15.0000 [IU] | Freq: Three times a day (TID) | SUBCUTANEOUS | Status: DC

## 2014-07-04 NOTE — Progress Notes (Signed)
Subjective:    Patient ID: [name], male    DOB: [birth year], 49 y.o.   MRN: [id number]  HPI: Pt presents to clinic for bilateral shoulder pain. Of note, he also wishes to briefly discuss his diabetes.  Bilateral shoulder pain - shoulder pain long-standing, worse with lifting arms up (adduction), bilaterally, worse for the past 2 months, especially for 2 weeks  chronic pain managed by pain clinic, generally doing "okay" otherwise - has had bilateral shoulder injections in the past, which tend to help for several months (last was in March 2015) - described as an aching pain, worse through the day, with neck spasm / pain - pain greatly interferes with sleep and day-to-day functioning at home  DM - requests check of A1c, today - received letter from his insurance company stating Novolog will no longer be covered but that Humalog will - reports compliance with Lantus daily, though CBG's typically range 120's - 180's and pt does not take mealtime insulin if he is ~120 or lower - reports increased exercise, walking more, compliance with diet changes (baked food, no sodas, more water, lean chicken, more vegetables) - does request referral to ophthalmology for eye exam; feels he needs new glasses  Review of Systems: As above. Denies fever / chills, N/V, change in bowel/bladder habits, SOB, chest pain, or abdominal pain.     Objective:   Physical Exam BP 145/84 mmHg  Pulse 67  Temp(Src) 98.1 F (36.7 C) (Oral)  Ht  (1.753 m) MSK: Shoulders normal in appearance bilaterally with no frank joint deformity/effusion   Shoulders and neck bilaterally with diffuse soft-tissue tendernes  Markedly reduced ROM in all planes of shoulder movements bilaterally secondary to pain   Marked neck stiffness / spasm  Positive special testing: Neer's and empty can (bilat), Hawkins (bilat)  Very similar to exam in March 2015, with even more marked limitation of ROM, L > R Neurovascular: alert /  oriented x4  Strength 4+/5 in bilateral shoulders through all planes secondary to pain with active resisted ROM   Strength 5/5 in grip and bilateral LE extremities  Sensation grossly intact  Distal pulses present / symmetric bilaterally     Assessment & Plan:  1. Bilateral shoulder pain - bilateral injections performed today with marked improvement in symptoms; see procedure notes below - etiology uncertain; arthritis seems more likely than rotator cuff pathology - ROM immediately improved after injection, likely effect of lidocaine - advised ice therapy to shoulders PRN - counseled on red flags to watch out for that would suggest local or systemic infection and counseled on likely side-effect of steroid causing elevated blood sugars - consider referral to orthopedics in the future considering pt has had multiple episodes of shoulder pain requiring injections - f/u as needed and instructed pt to f/u with pain clinic as scheduled for other pain issues  2. Diabetes - check A1c today at pt's request - continue Lantus; Rx today for Humalog to replace Novolog per pt's insurance formulary - continue CBG checks and return to clinic if any specific issues with new insulin, hypoglycemic episodes, etc - referred to ophthalmology, today, for diabetic-related eye exam (pt previously saw Dr. Nelle Don) - f/u PRN, otherwise  Right Shoulder Joint Injection (subacromial) Consent obtained and verified. Signed copy to be scanned into EMR. Time-out conducted. Skin marked with end of pen. Noted no overlying erythema, induration, or other signs of local infection. Skin prepped in a sterile fashion with iodine and alcohol swabs. Topical  analgesic spray: Ethyl chloride. Joint: right shoulder Needle: 1.5 inch, 25g Meds: Depo-Medrol 40 mg + 4 mL 1% lidocaine with epinephrine Completed without difficulty. Pt tolerated well Advised to call if fevers/chills, erythema, induration, drainage, or persistent  bleeding.  Left Shoulder Joint Injection (subacromial) Consent obtained and verified. Signed copy to be scanned into EMR. Time-out conducted. Skin marked with end of pen. Noted no overlying erythema, induration, or other signs of local infection. Skin prepped in a sterile fashion with iodine and alcohol swabs. Topical analgesic spray: Ethyl chloride. Joint: left shoulder Needle: 1.5 inch, 25g Meds: Depo-Medrol 40 mg + 4 mL 1% lidocaine with epinephrine Completed without difficulty. Pt tolerated well Advised to call if fevers/chills, erythema, induration, drainage, or persistent bleeding.  Bobbye Mortonhristopher M Macel Yearsley, MD PGY-3, [facility] 07/04/2014, 12:15 PM

## 2014-07-04 NOTE — Patient Instructions (Signed)
Thank you for coming in, today!  Let me know how your shoulders do with your injections. It might be worth sending you to the orthopedists, in the future, but we'll wait and see. Call if you have any issues.  We'll check your A1c, today. I will send you a letter with the result. I sent in a new prescription for HUMALOG to replace NOVOLOG for you mealtime insulin.  Come back to see me as you need. Please feel free to call with any questions or concerns at any time, at [phone]. --Dr. Casper HarrisonStreet

## 2014-07-08 DIAGNOSIS — G894 Chronic pain syndrome: Secondary | ICD-10-CM | POA: Diagnosis not present

## 2014-07-08 DIAGNOSIS — Z79891 Long term (current) use of opiate analgesic: Secondary | ICD-10-CM | POA: Diagnosis not present

## 2014-07-08 DIAGNOSIS — G629 Polyneuropathy, unspecified: Secondary | ICD-10-CM | POA: Diagnosis not present

## 2014-07-08 DIAGNOSIS — M1990 Unspecified osteoarthritis, unspecified site: Secondary | ICD-10-CM | POA: Diagnosis not present

## 2014-07-08 DIAGNOSIS — Z79899 Other long term (current) drug therapy: Secondary | ICD-10-CM | POA: Diagnosis not present

## 2014-07-09 ENCOUNTER — Telehealth: Payer: Self-pay | Admitting: Family Medicine

## 2014-07-09 NOTE — Telephone Encounter (Signed)
Pt reports both eyes are watering very bad, red and itchy. This has been going on for 2-3 days but today has been the worst. He has never had allergies in his eyes before. He does not have pain with eye movement, no fever, no systemic symptoms, and no real other coryza-type symptoms. He does not have frank eye discharge. 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 He has not taken any medications but has used cold tap water to rinse his eyes, which helps for 15-20 minutes at a time. He denies new exposures or sick contacts and is not around smokers, and he has no pets.  Plan: - Explained that this could be a viral conjunctivitis but does not sound like anything antibiotics would help. - Explained that if this is a viral syndrome, it should clear within 5-10 days, probably / hopefully before he sees ophthalmology. - Recommended avoiding tap water rinses to avoid drying his eyes out worse, and instead suggested OTC eye drops (Visine or something similar).  - Also recommended daily antihistamine (OTC Zyrtec, Claritin, etc) to help with itching, inflammation, and any allergic component (if there is any). - Advised that referral to ophthalmology has been placed and that he should hopefully hear about an appointment date / time in the next several days.  - Advised close f/u or presentation to the ED if he develops signs / symptoms of frank periorbital cellulitis or if his symptoms progress despite conservative measures. - Otherwise F/U as needed. Pt voiced understanding and appreciation.  Bobbye Mortonhristopher M Karrie Fluellen, MD PGY-3, [facility] 07/09/2014, 2:58 PM

## 2014-07-09 NOTE — Telephone Encounter (Signed)
Pt called about referral to eye dr. Andrey CotaStates his eyes have gotten worse in the last few days. They are watering, itching and feels like he is looking thru a haze. He is very concerned about this

## 2014-07-12 DIAGNOSIS — E1165 Type 2 diabetes mellitus with hyperglycemia: Secondary | ICD-10-CM | POA: Diagnosis not present

## 2014-07-17 ENCOUNTER — Telehealth: Payer: Self-pay | Admitting: Family Medicine

## 2014-07-17 DIAGNOSIS — E1142 Type 2 diabetes mellitus with diabetic polyneuropathy: Secondary | ICD-10-CM

## 2014-07-17 NOTE — Telephone Encounter (Signed)
Got a referral to go see Dr Nelle DonHollander for his eyes. Because they say he has an outstanding balance, they will not see him until he pays it. He doesn't want to deal with them.  He would like another referral to another eye dr Please advise

## 2014-07-17 NOTE — Telephone Encounter (Signed)
Will place new referral today. Please call pt to let him know. Thanks! --CMS

## 2014-07-22 NOTE — Telephone Encounter (Signed)
Pt scheduled at [facility] on Thursday feb 18th @ 9, patient informed.

## 2014-08-01 DIAGNOSIS — H5213 Myopia, bilateral: Secondary | ICD-10-CM | POA: Diagnosis not present

## 2014-08-01 LAB — HM DIABETES EYE EXAM

## 2014-08-26 DIAGNOSIS — H40053 Ocular hypertension, bilateral: Secondary | ICD-10-CM | POA: Diagnosis not present

## 2014-09-01 DIAGNOSIS — G4733 Obstructive sleep apnea (adult) (pediatric): Secondary | ICD-10-CM | POA: Diagnosis not present

## 2014-09-24 ENCOUNTER — Telehealth: Payer: Self-pay

## 2014-09-24 NOTE — Telephone Encounter (Signed)
Error

## 2014-09-30 DIAGNOSIS — G629 Polyneuropathy, unspecified: Secondary | ICD-10-CM | POA: Diagnosis not present

## 2014-09-30 DIAGNOSIS — M1990 Unspecified osteoarthritis, unspecified site: Secondary | ICD-10-CM | POA: Diagnosis not present

## 2014-09-30 DIAGNOSIS — Z79899 Other long term (current) drug therapy: Secondary | ICD-10-CM | POA: Diagnosis not present

## 2014-09-30 DIAGNOSIS — G894 Chronic pain syndrome: Secondary | ICD-10-CM | POA: Diagnosis not present

## 2014-10-28 DIAGNOSIS — E1165 Type 2 diabetes mellitus with hyperglycemia: Secondary | ICD-10-CM | POA: Diagnosis not present

## 2014-11-28 DIAGNOSIS — M1990 Unspecified osteoarthritis, unspecified site: Secondary | ICD-10-CM | POA: Diagnosis not present

## 2014-11-28 DIAGNOSIS — Z79899 Other long term (current) drug therapy: Secondary | ICD-10-CM | POA: Diagnosis not present

## 2014-11-28 DIAGNOSIS — M545 Low back pain: Secondary | ICD-10-CM | POA: Diagnosis not present

## 2014-11-28 DIAGNOSIS — G894 Chronic pain syndrome: Secondary | ICD-10-CM | POA: Diagnosis not present

## 2014-11-28 DIAGNOSIS — G629 Polyneuropathy, unspecified: Secondary | ICD-10-CM | POA: Diagnosis not present

## 2014-12-18 ENCOUNTER — Telehealth: Payer: Self-pay | Admitting: *Deleted

## 2014-12-18 NOTE — Telephone Encounter (Signed)
Received a fax from [facility] requesting a refill on LGP 0.5%. Medication is not listed on medication list. Fax placed in provider box for review.  Clovis PuMartin, Maisen Schmit L, RN

## 2015-01-09 ENCOUNTER — Other Ambulatory Visit: Payer: Self-pay | Admitting: Family Medicine

## 2015-01-10 NOTE — Telephone Encounter (Signed)
Insulin refilled. Please have the pt schedule an appt.  Thanks, Joanna Puff, MD [facility] Resident  01/10/2015, 1:48 PM

## 2015-01-13 ENCOUNTER — Telehealth: Payer: Self-pay | Admitting: *Deleted

## 2015-01-13 NOTE — Telephone Encounter (Signed)
Prior Authorization received from [facility] for Novolog. Preferred drug is Humalog.  Please change to Humalog.  Clovis Pu, RN

## 2015-01-13 NOTE — Telephone Encounter (Signed)
Prior Authorization received from [facility] pharmacy for Novolog. Preferred drug is Humalog.  Please send in Humalog.  Clovis Pu, RN

## 2015-01-14 MED ORDER — INSULIN LISPRO 100 UNIT/ML ~~LOC~~ SOLN: 15.0000 [IU] | Freq: Three times a day (TID) | SUBCUTANEOUS | Status: DC

## 2015-01-14 NOTE — Telephone Encounter (Signed)
Rx for Humalog sent. Please have the pt make an appt with me as soon as possible.  Thanks, Joanna Puff, MD [facility] Resident  01/14/2015, 10:26 AM

## 2015-01-23 DIAGNOSIS — M1990 Unspecified osteoarthritis, unspecified site: Secondary | ICD-10-CM | POA: Diagnosis not present

## 2015-01-23 DIAGNOSIS — Z79899 Other long term (current) drug therapy: Secondary | ICD-10-CM | POA: Diagnosis not present

## 2015-01-23 DIAGNOSIS — G629 Polyneuropathy, unspecified: Secondary | ICD-10-CM | POA: Diagnosis not present

## 2015-01-23 DIAGNOSIS — M545 Low back pain: Secondary | ICD-10-CM | POA: Diagnosis not present

## 2015-01-23 DIAGNOSIS — G894 Chronic pain syndrome: Secondary | ICD-10-CM | POA: Diagnosis not present

## 2015-01-23 DIAGNOSIS — M5417 Radiculopathy, lumbosacral region: Secondary | ICD-10-CM | POA: Diagnosis not present

## 2015-01-30 ENCOUNTER — Other Ambulatory Visit: Payer: Self-pay | Admitting: Pain Medicine

## 2015-01-30 DIAGNOSIS — M545 Low back pain: Secondary | ICD-10-CM

## 2015-02-11 ENCOUNTER — Ambulatory Visit
Admission: RE | Admit: 2015-02-11 | Discharge: 2015-02-11 | Disposition: A | Discharge status: Home / Self Care | Payer: Medicare Other | Source: Ambulatory Visit | Attending: Pain Medicine | Admitting: Pain Medicine

## 2015-02-11 DIAGNOSIS — M4806 Spinal stenosis, lumbar region: Secondary | ICD-10-CM | POA: Diagnosis not present

## 2015-02-11 DIAGNOSIS — M545 Low back pain: Secondary | ICD-10-CM

## 2015-02-11 DIAGNOSIS — M5126 Other intervertebral disc displacement, lumbar region: Secondary | ICD-10-CM | POA: Diagnosis not present

## 2015-02-11 MED ORDER — GADOBENATE DIMEGLUMINE 529 MG/ML IV SOLN
20.0000 mL | Freq: Once | INTRAVENOUS | Status: AC | PRN
  Administered 2015-02-11: 20 mL via INTRAVENOUS

## 2015-02-13 DIAGNOSIS — F4542 Pain disorder with related psychological factors: Secondary | ICD-10-CM | POA: Diagnosis not present

## 2015-02-13 DIAGNOSIS — M5417 Radiculopathy, lumbosacral region: Secondary | ICD-10-CM | POA: Diagnosis not present

## 2015-02-13 DIAGNOSIS — G894 Chronic pain syndrome: Secondary | ICD-10-CM | POA: Diagnosis not present

## 2015-02-13 DIAGNOSIS — M961 Postlaminectomy syndrome, not elsewhere classified: Secondary | ICD-10-CM | POA: Diagnosis not present

## 2015-03-13 ENCOUNTER — Ambulatory Visit: Payer: Medicare Other | Admitting: Internal Medicine

## 2015-03-20 DIAGNOSIS — E669 Obesity, unspecified: Secondary | ICD-10-CM | POA: Diagnosis not present

## 2015-03-20 DIAGNOSIS — M961 Postlaminectomy syndrome, not elsewhere classified: Secondary | ICD-10-CM | POA: Diagnosis not present

## 2015-03-20 DIAGNOSIS — M5137 Other intervertebral disc degeneration, lumbosacral region: Secondary | ICD-10-CM | POA: Diagnosis not present

## 2015-03-20 DIAGNOSIS — Z79899 Other long term (current) drug therapy: Secondary | ICD-10-CM | POA: Diagnosis not present

## 2015-03-20 DIAGNOSIS — G894 Chronic pain syndrome: Secondary | ICD-10-CM | POA: Diagnosis not present

## 2015-03-20 DIAGNOSIS — M47817 Spondylosis without myelopathy or radiculopathy, lumbosacral region: Secondary | ICD-10-CM | POA: Diagnosis not present

## 2015-03-25 ENCOUNTER — Ambulatory Visit (INDEPENDENT_AMBULATORY_CARE_PROVIDER_SITE_OTHER): Payer: Medicare Other | Admitting: Family Medicine

## 2015-03-25 ENCOUNTER — Encounter: Payer: Self-pay | Admitting: Family Medicine

## 2015-03-25 VITALS — BP 142/74 | HR 74 | Temp 97.8°F | Ht 69.0 in | Wt 275.6 lb

## 2015-03-25 DIAGNOSIS — R1010 Upper abdominal pain, unspecified: Secondary | ICD-10-CM | POA: Diagnosis not present

## 2015-03-25 DIAGNOSIS — M25511 Pain in right shoulder: Secondary | ICD-10-CM | POA: Diagnosis not present

## 2015-03-25 DIAGNOSIS — M25512 Pain in left shoulder: Secondary | ICD-10-CM

## 2015-03-25 DIAGNOSIS — R109 Unspecified abdominal pain: Secondary | ICD-10-CM | POA: Insufficient documentation

## 2015-03-25 LAB — COMPREHENSIVE METABOLIC PANEL
ALBUMIN: 4.1 g/dL (ref 3.6–5.1)
ALT: 25 U/L (ref 9–46)
AST: 22 U/L (ref 10–40)
Alkaline Phosphatase: 65 U/L (ref 40–115)
BILIRUBIN TOTAL: 0.4 mg/dL (ref 0.2–1.2)
BUN: 15 mg/dL (ref 7–25)
CO2: 30 mmol/L (ref 20–31)
CREATININE: 0.9 mg/dL (ref 0.60–1.35)
Calcium: 9.1 mg/dL (ref 8.6–10.3)
Chloride: 101 mmol/L (ref 98–110)
Glucose, Bld: 118 mg/dL — ABNORMAL HIGH (ref 65–99)
Potassium: 4.3 mmol/L (ref 3.5–5.3)
SODIUM: 139 mmol/L (ref 135–146)
Total Protein: 6.4 g/dL (ref 6.1–8.1)

## 2015-03-25 LAB — POCT URINALYSIS DIPSTICK
Bilirubin, UA: NEGATIVE
GLUCOSE UA: NEGATIVE
Ketones, UA: NEGATIVE
Leukocytes, UA: NEGATIVE
NITRITE UA: NEGATIVE
PROTEIN UA: 100
RBC UA: NEGATIVE
SPEC GRAV UA: 1.02
UROBILINOGEN UA: 0.2
pH, UA: 7

## 2015-03-25 LAB — LIPASE: LIPASE: 24 U/L (ref 7–60)

## 2015-03-25 LAB — POCT UA - MICROSCOPIC ONLY

## 2015-03-25 LAB — CBC
HCT: 41.8 % (ref 39.0–52.0)
Hemoglobin: 14.2 g/dL (ref 13.0–17.0)
MCH: 30.3 pg (ref 26.0–34.0)
MCHC: 34 g/dL (ref 30.0–36.0)
MCV: 89.3 fL (ref 78.0–100.0)
MPV: 9.5 fL (ref 8.6–12.4)
PLATELETS: 214 10*3/uL (ref 150–400)
RBC: 4.68 MIL/uL (ref 4.22–5.81)
RDW: 13.5 % (ref 11.5–15.5)
WBC: 11.3 10*3/uL — AB (ref 4.0–10.5)

## 2015-03-25 MED ORDER — METHYLPREDNISOLONE ACETATE 40 MG/ML IJ SUSP
40.0000 mg | Freq: Once | INTRAMUSCULAR | Status: AC
  Administered 2015-03-25: 40 mg via INTRA_ARTICULAR

## 2015-03-27 ENCOUNTER — Telehealth: Payer: Self-pay | Admitting: *Deleted

## 2015-03-27 DIAGNOSIS — G894 Chronic pain syndrome: Secondary | ICD-10-CM | POA: Diagnosis not present

## 2015-03-27 DIAGNOSIS — M1990 Unspecified osteoarthritis, unspecified site: Secondary | ICD-10-CM | POA: Diagnosis not present

## 2015-03-27 DIAGNOSIS — G629 Polyneuropathy, unspecified: Secondary | ICD-10-CM | POA: Diagnosis not present

## 2015-03-27 DIAGNOSIS — M545 Low back pain: Secondary | ICD-10-CM | POA: Diagnosis not present

## 2015-03-27 NOTE — Telephone Encounter (Signed)
LMOVM for pt to call us back. Please read message below. Eshaal Duby, CMA  

## 2015-03-27 NOTE — Progress Notes (Signed)
LM for patient to call back. Ezequias Lard,CMA  

## 2015-03-27 NOTE — Telephone Encounter (Signed)
-----   Message from Abram SanderElena M Adamo, MD sent at 03/26/2015  8:17 AM EDT ----- Please let [name] know that his lab work was all normal except for a slight bump in his white blood cells which can be caused by many things, including a cold. If he's not feeling any better by Friday he should let us know and I will order a CT scan of his belly as we discussed at his visit yesterday. Thanks!

## 2015-03-28 NOTE — Progress Notes (Signed)
   Subjective:   [name] is a 49 y.o. male with a history of OA, DM, HTN, OSA here for flank pain  Patient reports severe pain in left flank. No trauma. Patient reports no h/o this pain in the past. No n/v/d, no fever, dysuria, frequency/urgency. No alleviating or worsening factors. Has tried chronic pain meds only (vicodin, gabapentin), no relief.  Also reports progressive worsening of bilateral shoulder pain over the past few weeks. This is a chronic issue for him and responds well to corticosteroid injections which he receives every 6-8 months. He has limited abduction R>L d/t pain  Review of Systems:  Per HPI. All other systems reviewed and are negative.   PMH, PSH, Medications, Allergies, and FmHx reviewed and updated in EMR.  Social History: current smoker  Objective:  BP 142/74 mmHg  Pulse 74  Temp(Src) 97.8 F (36.6 C) (Oral)  Ht 5\' 9"  (1.753 m)  Wt 275 lb 9.6 oz (125.011 kg)  BMI 40.68 kg/m2  Gen:  49 y.o. male in NAD HEENT: NCAT, MMM, EOMI, PERRL, anicteric sclerae CV: RRR, no MRG, no JVD Resp: Non-labored, CTAB, no wheezes noted Abd: Soft, obese, tender primarily to LUQ and flank, no rebound, BS present, no guarding or organomegaly Ext: WWP, no edema MSK: Limited abduction of R shoulder, bilateral pain with abduction, normal strength, no tenderness or visible swelling Neuro: Alert and oriented, speech normal      Chemistry      Component Value Date/Time   NA 139 03/25/2015 1443   K 4.3 03/25/2015 1443   CL 101 03/25/2015 1443   CO2 30 03/25/2015 1443   BUN 15 03/25/2015 1443   CREATININE 0.90 03/25/2015 1443   CREATININE 0.98 02/03/2012 0635      Component Value Date/Time   CALCIUM 9.1 03/25/2015 1443   ALKPHOS 65 03/25/2015 1443   AST 22 03/25/2015 1443   ALT 25 03/25/2015 1443   BILITOT 0.4 03/25/2015 1443      Lab Results  Component Value Date   WBC 11.3* 03/25/2015   HGB 14.2 03/25/2015   HCT 41.8 03/25/2015   MCV 89.3 03/25/2015   PLT 214 03/25/2015   Lab Results  Component Value Date   TSH 1.661 09/03/2013   Lab Results  Component Value Date   HGBA1C 6.2 07/04/2014   Assessment & Plan:     Drew LanesBrian C Murphy is a 49 y.o. male here for shoulder pain and flank pain  Bilateral shoulder pain Recurrent bilateral shoulder pain and ROm limitations associated with arthritis, responds well to steroid injections q6-8 months - repeat injections today - f/u prn  Flank pain, acute Acute L flank pain x3 days, no GI or GU symptoms, Diffuse L-abdominal tenderness on exam, no peritoneal signs - possible abdominal wall muscle strain/spasm, some concerns for renal calculus, diverticulitis, pancreatitis, but less likely given lack of associated symptoms - Will check UA, CMP, CBC, lipase - consider CT if not improving over the next few days - chronic meds for pain control, add heating pack prn - f/u Friday if not improving      Beverely LowElena Tristan Bramble, MD, MPH [facility] PGY-3 04/02/2015 4:13 PM

## 2015-03-28 NOTE — Assessment & Plan Note (Signed)
Recurrent bilateral shoulder pain and ROm limitations associated with arthritis, responds well to steroid injections q6-8 months - repeat injections today - f/u prn

## 2015-04-02 NOTE — Assessment & Plan Note (Signed)
Acute L flank pain x3 days, no GI or GU symptoms, Diffuse L-abdominal tenderness on exam, no peritoneal signs - possible abdominal wall muscle strain/spasm, some concerns for renal calculus, diverticulitis, pancreatitis, but less likely given lack of associated symptoms - Will check UA, CMP, CBC, lipase - consider CT if not improving over the next few days - chronic meds for pain control, add heating pack prn - f/u Friday if not improving

## 2015-04-10 ENCOUNTER — Telehealth: Payer: Self-pay | Admitting: Family Medicine

## 2015-04-10 NOTE — Telephone Encounter (Signed)
Received fax for Rx refills. Patient has never met with me. Please have him make an appt with me.  Thanks, Archie Patten, MD [facility] Resident  04/10/2015, 1:55 PM

## 2015-04-17 NOTE — Telephone Encounter (Signed)
LMOVM for pt to call us back. Please read message below. Deseree Blount, CMA  

## 2015-05-07 ENCOUNTER — Telehealth: Payer: Self-pay | Admitting: *Deleted

## 2015-05-07 NOTE — Telephone Encounter (Signed)
Patient filled script today for MPLPMI [place] [place].  Didn't see this mixed ointment on list but placed refill request in provider's box. Mohab Ashby,CMA

## 2015-05-15 DIAGNOSIS — Z79891 Long term (current) use of opiate analgesic: Secondary | ICD-10-CM | POA: Diagnosis not present

## 2015-05-15 DIAGNOSIS — M961 Postlaminectomy syndrome, not elsewhere classified: Secondary | ICD-10-CM | POA: Diagnosis not present

## 2015-05-15 DIAGNOSIS — M47817 Spondylosis without myelopathy or radiculopathy, lumbosacral region: Secondary | ICD-10-CM | POA: Diagnosis not present

## 2015-05-15 DIAGNOSIS — Z79899 Other long term (current) drug therapy: Secondary | ICD-10-CM | POA: Diagnosis not present

## 2015-05-15 DIAGNOSIS — G894 Chronic pain syndrome: Secondary | ICD-10-CM | POA: Diagnosis not present

## 2015-05-15 DIAGNOSIS — E669 Obesity, unspecified: Secondary | ICD-10-CM | POA: Diagnosis not present

## 2015-05-15 DIAGNOSIS — M5137 Other intervertebral disc degeneration, lumbosacral region: Secondary | ICD-10-CM | POA: Diagnosis not present

## 2015-05-29 ENCOUNTER — Other Ambulatory Visit: Payer: Self-pay | Admitting: Family Medicine

## 2015-06-03 ENCOUNTER — Ambulatory Visit (INDEPENDENT_AMBULATORY_CARE_PROVIDER_SITE_OTHER): Payer: Medicare Other | Admitting: Family Medicine

## 2015-06-03 ENCOUNTER — Encounter: Payer: Self-pay | Admitting: Family Medicine

## 2015-06-03 VITALS — BP 142/102 | HR 82 | Temp 97.7°F | Ht 69.0 in | Wt 280.2 lb

## 2015-06-03 DIAGNOSIS — L0293 Carbuncle, unspecified: Secondary | ICD-10-CM

## 2015-06-03 DIAGNOSIS — L988 Other specified disorders of the skin and subcutaneous tissue: Secondary | ICD-10-CM | POA: Insufficient documentation

## 2015-06-03 DIAGNOSIS — L0501 Pilonidal cyst with abscess: Secondary | ICD-10-CM

## 2015-06-03 MED ORDER — CLINDAMYCIN HCL 300 MG PO CAPS: 300.0000 mg | ORAL_CAPSULE | Freq: Three times a day (TID) | ORAL | Status: DC

## 2015-06-03 NOTE — Progress Notes (Signed)
Subjective:    Patient ID: [name], male    DOB: [birth year], 49 y.o.   MRN: [id number]  [name] is a 49 y.o. male presenting on 06/03/2015 for cyst on back  Patient presents for a same day appointment.  HPI  BOILS, RECURRENT / History of PILONIDAL CYST: - Symptoms started Friday around 4 days ago with pain and increasing size, without redness. Taking home chronic pain Norco with relief. Taking ASA  daily no other blood thinners. - Has DM with well controlled A1c 6.2 - Prior history of multiple boils removed and I&D. History of hereditary recurrent boils. Denies prior history of MRSA. - Last episode 09/2013 in ED treated with I&D with large purulent drainage without packing last time. Clindamycin course. Additionally has been followed by General Surgery within past 2 years at [facility] for Pilonidal Cyst in same area, discussed surgery but it was I&D instead. Denies any fevers/chills, sweats, nausea, vomiting, rash  Past Medical History  Diagnosis Date  . Neuropathy (HCC)   . Obesity   . Collapsed lung     Left side  . OSA (obstructive sleep apnea)   . Diabetes mellitus   . Pain in back   . Environmental allergies   . Arthritis   . Seizures [facility])     Social History   Social History  . Marital Status: Married    Spouse Name: N/A  . Number of Children: N/A  . Years of Education: N/A   Occupational History  . Not on file.   Social History Main Topics  . Smoking status: Former Smoker -- 1.50 packs/day for 32 years    Types: Cigarettes    Quit date: 01/30/2012  . Smokeless tobacco: Never Used  . Alcohol Use: Yes     Comment: occ  . Drug Use: No  . Sexual Activity: Not on file   Other Topics Concern  . Not on file   Social History Narrative    Current Outpatient Prescriptions on File Prior to Visit  Medication Sig  . ACCU-CHEK AVIVA PLUS test strip TEST BLOOD SUGARS 3 TIMES DAILY  . aspirin 81 MG tablet Take 81 mg by mouth  daily.  . diclofenac sodium (VOLTAREN) 1 % GEL Apply 4 g topically 4 (four) times daily.  Marland Kitchen docusate sodium (COLACE) 100 MG capsule Take 1 capsule (100 mg total) by mouth every 12 (twelve) hours.  . gabapentin (NEURONTIN) 600 MG tablet Take 1 tablet (600 mg total) by mouth 3 (three) times daily.  Marland Kitchen HYDROcodone-acetaminophen (NORCO) 10-325 MG per tablet Take 1 tablet by mouth every 6 (six) hours as needed for pain.  Marland Kitchen insulin lispro (HUMALOG) 100 UNIT/ML injection Inject 0.15 mLs (15 Units total) into the skin 3 (three) times daily before meals.  . Insulin Syringe-Needle U-100 29G X 7/16" 0.3 ML MISC Use as directed for insulin injection  . ketoconazole (NIZORAL) 2 % shampoo APPLY TOPICALLY DAILY.  Marland Kitchen Lancets MISC Please provide lancets to use with glucometer.  Testing frequency qam.  . LANTUS 100 UNIT/ML injection INJECT 0.36 MLS (36 UNITS TOTAL) INTO THE SKIN DAILY.  Marland Kitchen lidocaine (XYLOCAINE) 5 % ointment APPLY TO AFFECTED AREAS EVERY 12 HOURS  . Multiple Vitamin (MULTIVITAMIN WITH MINERALS) TABS tablet Take 1 tablet by mouth daily.   No current facility-administered medications on file prior to visit.    Review of Systems Per HPI unless specifically indicated above     Objective:    BP 142/102 mmHg  Pulse 82  Temp(Src) 97.7 F (36.5 C) (Oral)  Ht 5\' 9"  (1.753 m)  Wt 280 lb 3.2 oz (127.098 kg)  BMI 41.36 kg/m2  Wt Readings from Last 3 Encounters:  06/03/15 280 lb 3.2 oz (127.098 kg)  03/25/15 275 lb 9.6 oz (125.011 kg)  02/11/15 255 lb (115.667 kg)    Physical Exam  Constitutional: He appears well-developed and well-nourished. No distress.  Well appearing  HENT:  Mouth/Throat: Oropharynx is clear and moist.  Neck: Neck supple.  Cardiovascular: Normal rate, regular rhythm, normal heart sounds and intact distal pulses.   No murmur heard. Musculoskeletal: He exhibits no edema.  Neurological: He is alert.  Skin: Skin is warm and dry. No rash noted. He is not diaphoretic.    Midline lower Lumbar (near top of gluteal cleft) palpable 2 x 2 cm round smooth deep soft cystlike structure, mild tenderness to touch, no erythema, no opening or ulceration, no drainage. See picture.   Nursing note and vitals reviewed.        Results for orders placed or performed in visit on 03/25/15  Comprehensive metabolic panel  Result Value Ref Range   Sodium 139 135 - 146 mmol/L   Potassium 4.3 3.5 - 5.3 mmol/L   Chloride 101 98 - 110 mmol/L   CO2 30 20 - 31 mmol/L   Glucose, Bld 118 (H) 65 - 99 mg/dL   BUN 15 7 - 25 mg/dL   Creat 9.810.90 1.910.60 - 4.781.35 mg/dL   Total Bilirubin 0.4 0.2 - 1.2 mg/dL   Alkaline Phosphatase 65 40 - 115 U/L   AST 22 10 - 40 U/L   ALT 25 9 - 46 U/L   Total Protein 6.4 6.1 - 8.1 g/dL   Albumin 4.1 3.6 - 5.1 g/dL   Calcium 9.1 8.6 - 29.510.3 mg/dL  Lipase  Result Value Ref Range   Lipase 24 7 - 60 U/L  CBC  Result Value Ref Range   WBC 11.3 (H) 4.0 - 10.5 K/uL   RBC 4.68 4.22 - 5.81 MIL/uL   Hemoglobin 14.2 13.0 - 17.0 g/dL   HCT 62.141.8 30.839.0 - 65.752.0 %   MCV 89.3 78.0 - 100.0 fL   MCH 30.3 26.0 - 34.0 pg   MCHC 34.0 30.0 - 36.0 g/dL   RDW 84.613.5 96.211.5 - 95.215.5 %   Platelets 214 150 - 400 K/uL   MPV 9.5 8.6 - 12.4 fL  Urinalysis Dipstick  Result Value Ref Range   Color, UA YELLOW    Clarity, UA CLEAR    Glucose, UA NEG    Bilirubin, UA NEG    Ketones, UA NEG    Spec Grav, UA 1.020    Blood, UA NEG    pH, UA 7.0    Protein, UA 100    Urobilinogen, UA 0.2    Nitrite, UA NEG    Leukocytes, UA Negative Negative  POCT UA - Microscopic Only  Result Value Ref Range   WBC, Ur, HPF, POC NONE    RBC, urine, microscopic NONE    Bacteria, U Microscopic RARE    Epithelial cells, urine per micros NONE       Assessment & Plan:   Problem List Items Addressed This Visit    Pilonidal cyst with abscess - Primary    Suspected pilonidal cyst with abscess given prior recurrent history of pilonidal cyst in same area treated by Gen Surg 2 years ago. Without any  evidence of cellulitis, no systemic symptoms of  infection.  Plan: 1. Discussed treatment options, patient interested in I&D, however after further counseling agreed to try conservative treatment with PO antibiotics Clindaymcin 300 TID x 10 days same course as before, and referral back to prior Gen Surgery ([place]) for re-evaluation, anticipate would need deeper surgical excision to remove and avoid recurrence or complications from deep pilonidal cyst with likely a sinus tract if recurrence from prior.      Relevant Medications   clindamycin (CLEOCIN) 300 MG capsule   Other Relevant Orders   Ambulatory referral to General Surgery   Recurrent boils   Relevant Medications   clindamycin (CLEOCIN) 300 MG capsule   Other Relevant Orders   Ambulatory referral to General Surgery      Meds ordered this encounter  Medications  . clindamycin (CLEOCIN) 300 MG capsule    Sig: Take 1 capsule (300 mg total) by mouth 3 (three) times daily.    Dispense:  30 capsule    Refill:  0      Follow up plan: Return in about 4 weeks (around 07/01/2015) for follow-up pilonidal cyst.  Saralyn Pilar, DO [facility], PGY-3

## 2015-06-03 NOTE — Patient Instructions (Signed)
Thank you for coming in to clinic today.  1. For your Lower Back Cyst - I think that this is similar to the prior Pilonidal Cyst, and my concern about draining this today is that these could track deeper and have some complications, given that this is a recurrent problem - Treat today with antibiotics - Clindamycin take 1 capsule 3 times a day for next 10 days - Continue current pain medicine regimen - Referral made to General Surgery, you should get an appointment within next few weeks, if you don't hear back, you may [facility] Address: [address] [place], [place], [place] [address] Phone: [phone]  If you develop worsening pain with associated nausea, vomiting, fevers/chills or sweats, redness or rash with worsening swelling, then please follow-up sooner, and may go to ED.  Please schedule a follow-up appointment with Dr Leonides Schanzorsey in 1 month to follow-up Pilonidal / Lower Back Cyst if not improved  If you have any other questions or concerns, please feel free to call the clinic to contact me. You may also schedule an earlier appointment if necessary.  However, if your symptoms get significantly worse, please go to the Emergency Department to seek immediate medical attention.  Saralyn PilarAlexander Chiniqua Kilcrease, DO [facility]

## 2015-06-04 NOTE — Assessment & Plan Note (Signed)
Suspected pilonidal cyst with abscess given prior recurrent history of pilonidal cyst in same area treated by Gen Surg 2 years ago. Without any evidence of cellulitis, no systemic symptoms of infection.  Plan: 1. Discussed treatment options, patient interested in I&D, however after further counseling agreed to try conservative treatment with PO antibiotics Clindaymcin 300 TID x 10 days same course as before, and referral back to prior Gen Surgery ([place]) for re-evaluation, anticipate would need deeper surgical excision to remove and avoid recurrence or complications from deep pilonidal cyst with likely a sinus tract if recurrence from prior.

## 2015-06-12 DIAGNOSIS — M961 Postlaminectomy syndrome, not elsewhere classified: Secondary | ICD-10-CM | POA: Diagnosis not present

## 2015-06-12 DIAGNOSIS — M5137 Other intervertebral disc degeneration, lumbosacral region: Secondary | ICD-10-CM | POA: Diagnosis not present

## 2015-06-12 DIAGNOSIS — G894 Chronic pain syndrome: Secondary | ICD-10-CM | POA: Diagnosis not present

## 2015-06-12 DIAGNOSIS — Z79899 Other long term (current) drug therapy: Secondary | ICD-10-CM | POA: Diagnosis not present

## 2015-06-19 ENCOUNTER — Encounter: Payer: Self-pay | Admitting: Family Medicine

## 2015-06-19 ENCOUNTER — Ambulatory Visit: Payer: Medicare Other | Admitting: Family Medicine

## 2015-06-19 ENCOUNTER — Ambulatory Visit (INDEPENDENT_AMBULATORY_CARE_PROVIDER_SITE_OTHER): Payer: Medicare Other | Admitting: Family Medicine

## 2015-06-19 VITALS — BP 140/80 | HR 82 | Temp 98.0°F | Ht 69.0 in | Wt 273.0 lb

## 2015-06-19 DIAGNOSIS — E1142 Type 2 diabetes mellitus with diabetic polyneuropathy: Secondary | ICD-10-CM

## 2015-06-19 LAB — POCT GLYCOSYLATED HEMOGLOBIN (HGB A1C): HEMOGLOBIN A1C: 6.6

## 2015-06-19 MED ORDER — INSULIN LISPRO 100 UNIT/ML ~~LOC~~ SOLN: 20.0000 [IU] | Freq: Three times a day (TID) | SUBCUTANEOUS | Status: DC

## 2015-06-19 MED ORDER — INSULIN GLARGINE 100 UNIT/ML ~~LOC~~ SOLN: SUBCUTANEOUS | Status: DC

## 2015-06-19 MED ORDER — HYDROCHLOROTHIAZIDE 25 MG PO TABS: ORAL_TABLET | ORAL | Status: DC

## 2015-06-19 MED ORDER — LIDOCAINE 5 % EX OINT: 1.0000 "application " | TOPICAL_OINTMENT | CUTANEOUS | Status: DC | PRN

## 2015-06-19 MED ORDER — "INSULIN SYRINGE-NEEDLE U-100 29G X 7/16"" 0.3 ML MISC": Status: DC

## 2015-06-19 NOTE — Progress Notes (Signed)
Patient ID: [name], male   DOB: 1966-02-18, 50 y.o.   MRN: [id number]    Subjective: CC:DM HPI: Patient is a 50 y.o. male with a past medical history of DM, OSA, chronic pain presenting to clinic today for a f/u DM.  Diabetes:  CBGs at home: 120-150  Taking medications: Lantus 36 in AM and 30 Novolog TID with meals  Side effects: None ROS: denies fever, chills, dizziness, LOC, polyuria, polydipsia, numbness or tingling in extremities or chest pain. Last eye exam: 08/01/14  Last A1c: 6.2 on 1/16    Chronic pain: [facility] being [facility]. Notes his previous provider there used to prescribe him lidocaine ointment for his back and legs and HCTZ as needed for LE swelling among other medications. He is frustrated as there is a new provider who took over her patients who felt that he needed to contact his PCP to obtain the lidocaine ointment and HCTZ. He feels like his new provider is profiling him and is angry by this. I discussed that not all providers feel comfortable prescribing the same things others have. He notes he intermittently uses 1/2 tablet of the HCTZ (12.5mg ) for LE swelling, as it becomes very painful when his legs swell. He notes that he only uses it approximately once or twice a week. He tries to elevate his legs.   Steroid injections in shoulders in the past, last time 10/16. Notes that the last time he had an injection his shoulders were very painful afterwards and it was difficult to sleep. Notes they are doing ok now but concerned about what happens if they start to bother him in the future- if it becomes worse he'd like referral to sports medicine.   Social History: never smoker  Health Maintenance: declined flu vaccine   ROS: All other systems reviewed and are negative.  Past Medical History Patient Active Problem List   Diagnosis Date Noted  . Pilonidal cyst with abscess 06/03/2015  . Flank pain, acute 03/25/2015  . Weight loss  counseling, encounter for 12/20/2013  . Other malaise and fatigue 09/03/2013  . Difficulty concentrating 09/03/2013  . Tobacco dependence in remission 04/10/2013  . Seborrheic dermatitis of scalp 01/23/2013  . Onychomycosis 01/23/2013  . Bilateral shoulder pain 01/23/2013  . Skin tag 04/25/2012  . 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 OSA (obstructive sleep apnea) 01/31/2012  . Heel callus 12/11/2011  . ED (erectile dysfunction) of organic origin 12/11/2011  . Venous stasis 11/02/2011  . Recurrent boils 08/13/2009  . DISTURBANCE OF SKIN SENSATION 06/17/2009  . ARTHRALGIA 04/04/2008  . TRANSAMINASES, SERUM, ELEVATED 04/02/2008  . HYPERTRIGLYCERIDEMIA, SEVERE 04/27/2007  . LEG EDEMA, BILATERAL 03/10/2007  . TACHYCARDIA 01/30/2007  . OBESITY, NOS 08/11/2006  . HYPERTENSION, BENIGN SYSTEMIC 08/11/2006  . BACK PAIN W/RADIATION, UNSPECIFIED 08/11/2006    Medications- reviewed and updated Current Outpatient Prescriptions  Medication Sig Dispense Refill  . ACCU-CHEK AVIVA PLUS test strip TEST BLOOD SUGARS 3 TIMES DAILY 100 each PRN  . aspirin 81 MG tablet Take 81 mg by mouth daily.    . diclofenac sodium (VOLTAREN) 1 % GEL Apply 4 g topically 4 (four) times daily.    Marland Kitchen. docusate sodium (COLACE) 100 MG capsule Take 1 capsule (100 mg total) by mouth every 12 (twelve) hours. 60 capsule 0  . gabapentin (NEURONTIN) 600 MG tablet Take 1 tablet (600 mg total) by mouth 3 (three) times daily. 90 tablet 3  . hydrochlorothiazide (HYDRODIURIL) 25  MG tablet Take 1/2 tablet (12.5mg ) or 1 tablet (25mg ) for fluid retention. 30 tablet 2  . HYDROcodone-acetaminophen (NORCO) 10-325 MG per tablet Take 1 tablet by mouth every 6 (six) hours as needed for pain. 120 tablet 0  . insulin glargine (LANTUS) 100 UNIT/ML injection INJECT 0.36 MLS (36 UNITS TOTAL) INTO THE SKIN DAILY. 20 mL 5  . insulin lispro (HUMALOG) 100 UNIT/ML injection Inject 0.2 mLs (20 Units total) into the  skin 3 (three) times daily before meals. 10 mL 11  . Insulin Syringe-Needle U-100 29G X 7/16" 0.3 ML MISC Use as directed for insulin injection 100 each 11  . Lancets MISC Please provide lancets to use with glucometer.  Testing frequency qam. 100 each 6  . lidocaine (XYLOCAINE) 5 % ointment APPLY TO AFFECTED AREAS EVERY 12 HOURS  5  . lidocaine (XYLOCAINE) 5 % ointment Apply 1 application topically as needed. 35.44 g 2  . Multiple Vitamin (MULTIVITAMIN WITH MINERALS) TABS tablet Take 1 tablet by mouth daily.     No current facility-administered medications for this visit.    Objective: Office vital signs reviewed. BP 140/80 mmHg  Pulse 82  Temp(Src) 98 F (36.7 C) (Oral)  Ht 5\' 9"  (1.753 m)  Wt 273 lb (123.832 kg)  BMI 40.30 kg/m2   Physical Examination:  General: Awake, alert, well- nourished, NAD Cardio: RRR, no m/r/g noted. 1+ pitting edema on left, trace pitting edema on the right LE. Pulm: No increased WOB.  CTAB, without wheezes, rhonchi or crackles noted.  MSK: Full range of motion in the shoulders bilaterally today. Ambulates with cane. Skin: dry, intact, hyperpigmentation and some scaling over the anterior shins b/l consistent with venous statis.  A1c 6.6  Assessment/Plan: Type 2 diabetes mellitus with neurologic complication Patient deferred foot exam today as it is difficult to get his shoes off. A1c was at goal. No hypoglycemic episodes. - Continue Lantus 36units qAM and Humalog 20 units TID with meals. - Referral back to ophthalmology for retinal exam. - refilled syringe and insulin Rxs - encouraged to continue to check CBGs  - f/u 3 months or sooner as needed.   LEG EDEMA, BILATERAL Does not appear too significant but has stigmata of venous stasis. Patient prefers HCTZ over Lasix. - Refilled HCTZ today PRN LE swelling - encouraged him to keep legs elevated - has compression stockings at home he believes    Orders Placed This Encounter  Procedures  .  Ambulatory referral to Ophthalmology    Referral Priority:  Routine    Referral Type:  Consultation    Referral Reason:  Specialty Services Required    Requested Specialty:  Ophthalmology    Number of Visits Requested:  1  . HgB A1c    Meds ordered this encounter  Medications  . lidocaine (XYLOCAINE) 5 % ointment    Sig: Apply 1 application topically as needed.    Dispense:  35.44 g    Refill:  2  . hydrochlorothiazide (HYDRODIURIL) 25 MG tablet    Sig: Take 1/2 tablet (12.5mg ) or 1 tablet (25mg ) for fluid retention.    Dispense:  30 tablet    Refill:  2  . insulin glargine (LANTUS) 100 UNIT/ML injection    Sig: INJECT 0.36 MLS (36 UNITS TOTAL) INTO THE SKIN DAILY.    Dispense:  20 mL    Refill:  5  . insulin lispro (HUMALOG) 100 UNIT/ML injection    Sig: Inject 0.2 mLs (20 Units total) into the skin 3 (three)  times daily before meals.    Dispense:  10 mL    Refill:  11    Dx code E11.65  . Insulin Syringe-Needle U-100 29G X 7/16" 0.3 ML MISC    Sig: Use as directed for insulin injection    Dispense:  100 each    Refill:  11    Joanna Puff PGY-2, [facility]

## 2015-06-19 NOTE — Patient Instructions (Signed)
Good job on keeping your A1c down. I have refilled all your medications, you should get the insuline syringes in the mail. Let me know if your shoulders continue to give you pain. Follow up with me in 4 months or sooner as needed.

## 2015-06-20 NOTE — Assessment & Plan Note (Signed)
Does not appear too significant but has stigmata of venous stasis. Patient prefers HCTZ over Lasix. - Refilled HCTZ today PRN LE swelling - encouraged him to keep legs elevated - has compression stockings at home he believes

## 2015-06-20 NOTE — Assessment & Plan Note (Signed)
Patient deferred foot exam today as it is difficult to get his shoes off. A1c was at goal. No hypoglycemic episodes. - Continue Lantus 36units qAM and Humalog 20 units TID with meals. - Referral back to ophthalmology for retinal exam. - refilled syringe and insulin Rxs - encouraged to continue to check CBGs  - f/u 3 months or sooner as needed.

## 2015-06-25 ENCOUNTER — Other Ambulatory Visit: Payer: Self-pay | Admitting: Surgery

## 2015-06-25 DIAGNOSIS — L0591 Pilonidal cyst without abscess: Secondary | ICD-10-CM | POA: Diagnosis not present

## 2015-06-25 NOTE — H&P (Signed)
Laurette SchimkeBrian C. Sheral FlowBentley 06/25/2015 9:32 AM Location: [facility] Patient #: [id number] DOB: 07-Sep-1965 Married / Language: [place] / Race: White Male  History of Present Illness Ardeth Sportsman(Roseanne Juenger C. Shanea Karney MD; 06/25/2015 11:54 AM) The patient is a 50 year old male who presents with a pilonidal cyst. Note for "Pilonidal cyst": Patient sent by primary care physician for concern of repeated subcutaneous infections and tailbone concerning for chronic pilonidal disease.  He's had a long history of subcutaneous skin infections. He used to smoke. He quit 4 years ago. Diagnosed with diabetes. Hemoglobin A1c 6.6 on insulin. To a more vegetarian diet. Most of inspections of gone away. Also history history of a lot of back issues. He squired back surgeries by [facility]. Had a lot of scarring and burs. Mild sciatica to LEFT leg. However these felt not just above his buttocks but below incision. Intermittently swells and gets very painful. Had to be drained a few times. Another flare. Permit care office recommended considering surgical evaluation. Sometimes the infections can be controlled clindamycin. Sometimes requires drainage. Feels like some needs to be done. He is wondering if it safe to hold off on surgery until next month since he has thinks he has to take care of this month.   Other Problems Fay Records(Ashley Beck, CMA; 06/25/2015 9:33 AM) Anxiety Disorder Arthritis Back Pain Diabetes Mellitus Hemorrhoids Seizure Disorder Sleep Apnea  Past Surgical History Fay Records(Ashley Beck, CMA; 06/25/2015 9:33 AM) Appendectomy Oral Surgery Spinal Surgery - Lower Back  Diagnostic Studies History Fay Records(Ashley Beck, CMA; 06/25/2015 9:33 AM) Colonoscopy never  Allergies Fay Records(Ashley Beck, CMA; 06/25/2015 9:33 AM) Penicillin V *PENICILLINS* MetFORMIN HCl *CHEMICALS* Statins Support *DIETARY PRODUCTS/DIETARY MANAGEMENT PRODUCTS*  Medication History Fay Records(Ashley Beck, CMA; 06/25/2015 9:34  AM) Hydrocodone-Acetaminophen (10-325MG  Tablet, Oral) Active. Accu-Chek Aviva Plus (In Vitro) Active. HumaLOG (100UNIT/ML Solution, Subcutaneous) Active. HydroCHLOROthiazide (25MG  Tablet, Oral) Active. Lantus (100UNIT/ML Solution, Subcutaneous) Active. Lidocaine (5% Ointment, External) Active. Gabapentin (600MG  Tablet, Oral) Active. Voltaren (1% Gel, External) Active. Aspirin (81MG  Tablet, Oral) Active. Medications Reconciled  Social History Fay Records(Ashley Beck, [place]; 06/25/2015 9:33 AM) Alcohol use Remotely quit alcohol use. Caffeine use Coffee. Illicit drug use Remotely quit drug use. Tobacco use Former smoker.  Family History Fay Records(Ashley Beck, [place]; 06/25/2015 9:33 AM) Arthritis Father, Mother, Sister. Depression Mother, Son. Diabetes Mellitus Mother. Heart Disease Mother. Heart disease in male family member before age 48 Hypertension Mother. Respiratory Condition Mother.     Review of Systems Fay Records(Ashley Beck CMA; 06/25/2015 9:33 AM) General Present- Weight Gain. Not Present- Appetite Loss, Chills, Fatigue, Fever, Night Sweats and Weight Loss. Skin Present- Dryness and Non-Healing Wounds. Not Present- Change in Wart/Mole, Hives, Jaundice, New Lesions, Rash and Ulcer. HEENT Present- Hearing Loss, Ringing in the Ears and Wears glasses/contact lenses. Not Present- Earache, Hoarseness, Nose Bleed, Oral Ulcers, Seasonal Allergies, Sinus Pain, Sore Throat, Visual Disturbances and Yellow Eyes. Respiratory Present- Snoring. Not Present- Bloody sputum, Chronic Cough, Difficulty Breathing and Wheezing. Cardiovascular Present- Leg Cramps and Swelling of Extremities. Not Present- Chest Pain, Difficulty Breathing Lying Down, Palpitations, Rapid Heart Rate and Shortness of Breath. Gastrointestinal Present- Bloating. Not Present- Abdominal Pain, Bloody Stool, Change in Bowel Habits, Chronic diarrhea, Constipation, Difficulty Swallowing, Excessive gas, Gets full quickly at meals,  Hemorrhoids, Indigestion, Nausea, Rectal Pain and Vomiting. Male Genitourinary Present- Frequency and Urgency. Not Present- Blood in Urine, Change in Urinary Stream, Impotence, Nocturia, Painful Urination and Urine Leakage. Musculoskeletal Present- Back Pain, Joint Pain and Swelling of Extremities. Not Present- Joint Stiffness, Muscle Pain and Muscle Weakness. Neurological Present-  Numbness, Tingling, Trouble walking and Weakness. Not Present- Decreased Memory, Fainting, Headaches, Seizures and Tremor. Psychiatric Not Present- Anxiety, Bipolar, Change in Sleep Pattern, Depression, Fearful and Frequent crying. Hematology Not Present- Easy Bruising, Excessive bleeding, Gland problems, HIV and Persistent Infections.  Vitals Fay Records CMA; 06/25/2015 9:35 AM) 06/25/2015 9:35 AM Weight: 274 lb Height: 68in Body Surface Area: 2.34 m Body Mass Index: 41.66 kg/m  Temp.: 98.33F(Temporal)  Pulse: 68 (Regular)  BP: 136/78 (Sitting, Left Arm, Standard)      Physical Exam Ardeth Sportsman MD; 06/25/2015 10:20 AM)  General Mental Status-Alert. General Appearance-Not in acute distress, Not Sickly. Orientation-Oriented X3. Hydration-Well hydrated. Voice-Normal. Note: Morbidly obese. Walks with a cane.  Integumentary Global Assessment Upon inspection and palpation of skin surfaces of the - Axillae: non-tender, no inflammation or ulceration, no drainage. and Distribution of scalp and body hair is normal. General Characteristics Temperature - normal warmth is noted. Note: Numerous tattoos on trunk and extremities.  Head and Neck Head-normocephalic, atraumatic with no lesions or palpable masses. Face Global Assessment - atraumatic, no absence of expression. Neck Global Assessment - no abnormal movements, no bruit auscultated on the right, no bruit auscultated on the left, no decreased range of motion, non-tender. Trachea-midline. Thyroid Gland Characteristics -  non-tender.  Eye Eyeball - Left-Extraocular movements intact, No Nystagmus. Eyeball - Right-Extraocular movements intact, No Nystagmus. Cornea - Left-No Hazy. Cornea - Right-No Hazy. Sclera/Conjunctiva - Left-No scleral icterus, No Discharge. Sclera/Conjunctiva - Right-No scleral icterus, No Discharge. Pupil - Left-Direct reaction to light normal. Pupil - Right-Direct reaction to light normal.  ENMT Ears Pinna - Left - no drainage observed, no generalized tenderness observed. Right - no drainage observed, no generalized tenderness observed. Nose and Sinuses External Inspection of the Nose - no destructive lesion observed. Inspection of the nares - Left - quiet respiration. Right - quiet respiration. Mouth and Throat Lips - Upper Lip - no fissures observed, no pallor noted. Lower Lip - no fissures observed, no pallor noted. Nasopharynx - no discharge present. Oral Cavity/Oropharynx - Tongue - no dryness observed. Oral Mucosa - no cyanosis observed. Hypopharynx - no evidence of airway distress observed.  Chest and Lung Exam Inspection Movements - Normal and Symmetrical. Accessory muscles - No use of accessory muscles in breathing. Palpation Palpation of the chest reveals - Non-tender. Auscultation Breath sounds - Normal and Clear.  Cardiovascular Auscultation Rhythm - Regular. Murmurs & Other Heart Sounds - Auscultation of the heart reveals - No Murmurs and No Systolic Clicks.  Abdomen Inspection Inspection of the abdomen reveals - No Visible peristalsis and No Abnormal pulsations. Umbilicus - No Bleeding, No Urine drainage. Palpation/Percussion Palpation and Percussion of the abdomen reveal - Soft, Non Tender, No Rebound tenderness, No Rigidity (guarding) and No Cutaneous hyperesthesia. Note: Morbidly obese but soft. Mild diastases recti. Sensitive umbilicus but no hernia  Male Genitourinary Sexual Maturity Tanner 5 - Adult hair pattern and Adult penile size  and shape. Note: Prominent mons pubis but no infection. No panniculitis. No abscess.  Rectal Note: Perirectal skin clear. No abscess or infection. Deep intergluteal cleft with mild hair burden. Perhaps 2 small pits but no disease. Scar on LEFT intergluteal region but no active area.  Low lumbar incision. Between this region and the upper sacrum is a 4 x 3 cm somewhat firm and sensitive subcutaneous mass. No cellulitis. No fluctuance or purulence. But tender.  Peripheral Vascular Upper Extremity Inspection - Left - No Cyanotic nailbeds, Not Ischemic. Right - No Cyanotic nailbeds, Not  Ischemic.  Neurologic Neurologic evaluation reveals -normal attention span and ability to concentrate, able to name objects and repeat phrases. Appropriate fund of knowledge , normal sensation and normal coordination. Mental Status Affect - not angry, not paranoid. Cranial Nerves-Normal Bilaterally. Gait-Normal.  Neuropsychiatric Mental status exam performed with findings of-able to articulate well with normal speech/language, rate, volume and coherence, thought content normal with ability to perform basic computations and apply abstract reasoning and no evidence of hallucinations, delusions, obsessions or homicidal/suicidal ideation.  Musculoskeletal Global Assessment Spine, Ribs and Pelvis - no instability, subluxation or laxity. Right Upper Extremity - no instability, subluxation or laxity.  Lymphatic Head & Neck  General Head & Neck Lymphatics: Bilateral - Description - No Localized lymphadenopathy. Axillary  General Axillary Region: Bilateral - Description - No Localized lymphadenopathy. Femoral & Inguinal  Generalized Femoral & Inguinal Lymphatics: Left - Description - No Localized lymphadenopathy. Right - Description - No Localized lymphadenopathy.    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 Simmering for a long time. Never fully healed. Suspicious of cavity on MRI of spine in August. Not consistent with sarcoma or other tumor.  I think this area needs to be removed. Discuss my partner Dr. Gerrit Friends. The lack of redundant skin, suspect we'll have to leave it partially or completely open to let it granulate closed.  I will send a prescription for clindamycin in case it flares back up and an until can calm down. He would like to try and wait a few weeks until he has some family and other issues taken care of.  I noted the fact that he quit smoking in his tried to lose weight and he has aggressive diabetic control are good insurance policies against further attacks. Hopefully we can minimize further infections once we get this last area taking care of  Current Plans The anatomy of the gluteal and sacral region was discussed. Pathophysiology of pilonidal disease due to ingrown hairs was discussed. Importance of good hygiene discussed. Importance of keeping hairs trimmed in the intergluteal crease from anus to top of sacrum/tailbone spine noted. Need for good hygiene stressed. Use of electric razor or nose hair trimmers to keep the hairs trimmed discussed.  Risk of worsening progression needing surgical drainage of abscess or perhaps excision of chronic pilonidal disease with skin flap closure over drains discussed. Possible need for her to leave it open with packing to allow the wound close over several weeks/months discussed. Risks benefits alternatives to surgery discussed as well. Risk of recurrent disease discussed. Patient was expressed understanding. Literature given to patient. We will see if we can control this without an operation first.  Pt Education - [facility] Pilonidal Disease (AT) Pt Education - [facility] Pilonidal Surgery HCI - post op instructions:  discussed with patient and provided information. You are being scheduled for surgery - Our schedulers will call you.  You should hear from our office's scheduling department within 5 working days about the location, date, and time of surgery. We try to make accommodations for patient's preferences in scheduling surgery, but sometimes the OR schedule or the surgeon's schedule prevents [place] from making those accommodations.  If you have not heard from our office [phone]) in 5 working days, call the office and ask for your surgeon's nurse.  If you have other questions about your diagnosis, plan, or  surgery, call the office and ask for your surgeon's nurse.  Pt Education - [facility] Pain Control (Karlee Staff) Started Clindamycin HCl 300MG , 1 (one) Capsule four times daily, #30, 06/25/2015, Ref. Andres Labrum, M.D., F.A.C.S. Gastrointestinal and Minimally Invasive Surgery [facility] [address] [place], [place] [address] [phone] Main / Paging

## 2015-07-03 ENCOUNTER — Telehealth: Payer: Self-pay | Admitting: *Deleted

## 2015-07-03 NOTE — Telephone Encounter (Signed)
Received faxed refill request from [facility] in [place], [place] (ph [phone], fax [phone]) for CBTT*T 1/5/1/0.05% Quantity 180 g Apply 1-2 pumps (grams) to acne areas 2-3 times daily. Wash hands after use. This med is not on current or historical med list. Please advise. Fax placed in Dr. Derek Mound box for review. Fredderick Severance, RN

## 2015-07-03 NOTE — Telephone Encounter (Signed)
CBTT*T: Clindamycin 1%, Benzoyl Peroxide 5%, Tea Tree Oil 1%, Tretinoin 0.05%

## 2015-07-03 NOTE — Telephone Encounter (Signed)
Refilled and faxed.

## 2015-07-07 ENCOUNTER — Telehealth: Payer: Self-pay | Admitting: Family Medicine

## 2015-07-07 NOTE — Telephone Encounter (Signed)
Will forward to Dr. Leonides Schanz to send in insulin syringes.  Clovis Pu, RN

## 2015-07-07 NOTE — Telephone Encounter (Signed)
[facility]:  Need verbal order to refill insulin syringes...31 gauge Faxes were sent over but never received a response

## 2015-07-08 NOTE — Telephone Encounter (Signed)
Spoke with the patient as this was filled through [facility]. He stated he has syringes, they do NOT need to be filled in by [facility].  Thanks, Joanna Puff, MD [facility] Resident  07/08/2015, 1:10 PM

## 2015-07-10 DIAGNOSIS — M5137 Other intervertebral disc degeneration, lumbosacral region: Secondary | ICD-10-CM | POA: Diagnosis not present

## 2015-07-10 DIAGNOSIS — Z79891 Long term (current) use of opiate analgesic: Secondary | ICD-10-CM | POA: Diagnosis not present

## 2015-07-10 DIAGNOSIS — G894 Chronic pain syndrome: Secondary | ICD-10-CM | POA: Diagnosis not present

## 2015-07-10 DIAGNOSIS — Z79899 Other long term (current) drug therapy: Secondary | ICD-10-CM | POA: Diagnosis not present

## 2015-07-10 DIAGNOSIS — M961 Postlaminectomy syndrome, not elsewhere classified: Secondary | ICD-10-CM | POA: Diagnosis not present

## 2015-07-15 ENCOUNTER — Telehealth: Payer: Self-pay | Admitting: *Deleted

## 2015-07-15 NOTE — Telephone Encounter (Signed)
Received faxed request from [facility] (ph [phone] fax [phone]) stating that KGDLP: Ketoprofen 10%-Gabapentin 6%-Diclofenac 3% in EMLA (Lidocaine-Prilocaine) Base is not covered by patient's insurance. Requesting change to ADGALP: Amantadine 5%- diclofenac 3%- Gabapentin 8%- Amitriptyline 3% in EMLA (Lidocaine- Prilocaine) Base Qty 240 Apply 1-2 pumps(1-2 grams) to the painful areas 3-4 times a day. Rub in well.Wash hands after use. Patient has diclofenac 1% gel and lidocaine 5% ointment listed on current med list. Fax placed in Dr. Derek Mound box for review. Fredderick Severance, RN

## 2015-07-17 NOTE — Telephone Encounter (Signed)
Alternative sent.  [name] [facility]

## 2015-08-01 ENCOUNTER — Encounter (HOSPITAL_BASED_OUTPATIENT_CLINIC_OR_DEPARTMENT_OTHER): Payer: Self-pay | Admitting: *Deleted

## 2015-08-01 NOTE — Progress Notes (Signed)
NPO AFTER MN.  ARRIVE AT 1030.  NEEDS ISTAT 8 AND EKG.  WILL TAKE GABAPENTIN AND HYDROCODONE AM DOS W/ SIPS OF WATER.  WILL DO HIBICLENS SHOWER HS BEFORE AND AM DOS.

## 2015-08-07 ENCOUNTER — Encounter (HOSPITAL_BASED_OUTPATIENT_CLINIC_OR_DEPARTMENT_OTHER): Payer: Self-pay | Admitting: Anesthesiology

## 2015-08-07 ENCOUNTER — Ambulatory Visit (HOSPITAL_BASED_OUTPATIENT_CLINIC_OR_DEPARTMENT_OTHER): Payer: Medicare Other | Admitting: Anesthesiology

## 2015-08-07 ENCOUNTER — Ambulatory Visit (HOSPITAL_BASED_OUTPATIENT_CLINIC_OR_DEPARTMENT_OTHER)
Admission: RE | Admit: 2015-08-07 | Discharge: 2015-08-07 | Disposition: A | Discharge status: Home / Self Care | Payer: Medicare Other | Source: Ambulatory Visit | Attending: Surgery | Admitting: Surgery

## 2015-08-07 ENCOUNTER — Encounter (HOSPITAL_BASED_OUTPATIENT_CLINIC_OR_DEPARTMENT_OTHER)
Admission: RE | Disposition: A | Discharge status: Home / Self Care | Payer: Self-pay | Source: Ambulatory Visit | Attending: Surgery

## 2015-08-07 DIAGNOSIS — Z87891 Personal history of nicotine dependence: Secondary | ICD-10-CM | POA: Diagnosis not present

## 2015-08-07 DIAGNOSIS — I10 Essential (primary) hypertension: Secondary | ICD-10-CM | POA: Diagnosis not present

## 2015-08-07 DIAGNOSIS — Z79891 Long term (current) use of opiate analgesic: Secondary | ICD-10-CM | POA: Insufficient documentation

## 2015-08-07 DIAGNOSIS — Z6841 Body Mass Index (BMI) 40.0 and over, adult: Secondary | ICD-10-CM | POA: Diagnosis not present

## 2015-08-07 DIAGNOSIS — G4730 Sleep apnea, unspecified: Secondary | ICD-10-CM | POA: Insufficient documentation

## 2015-08-07 DIAGNOSIS — E119 Type 2 diabetes mellitus without complications: Secondary | ICD-10-CM | POA: Insufficient documentation

## 2015-08-07 DIAGNOSIS — Z7982 Long term (current) use of aspirin: Secondary | ICD-10-CM | POA: Diagnosis not present

## 2015-08-07 DIAGNOSIS — M1990 Unspecified osteoarthritis, unspecified site: Secondary | ICD-10-CM | POA: Insufficient documentation

## 2015-08-07 DIAGNOSIS — Z79899 Other long term (current) drug therapy: Secondary | ICD-10-CM | POA: Diagnosis not present

## 2015-08-07 DIAGNOSIS — Z794 Long term (current) use of insulin: Secondary | ICD-10-CM | POA: Diagnosis not present

## 2015-08-07 DIAGNOSIS — L988 Other specified disorders of the skin and subcutaneous tissue: Secondary | ICD-10-CM | POA: Diagnosis present

## 2015-08-07 DIAGNOSIS — Z9989 Dependence on other enabling machines and devices: Secondary | ICD-10-CM | POA: Insufficient documentation

## 2015-08-07 DIAGNOSIS — M549 Dorsalgia, unspecified: Secondary | ICD-10-CM | POA: Diagnosis not present

## 2015-08-07 DIAGNOSIS — L0591 Pilonidal cyst without abscess: Secondary | ICD-10-CM | POA: Insufficient documentation

## 2015-08-07 HISTORY — DX: Type 2 diabetes mellitus without complications: E11.9

## 2015-08-07 HISTORY — DX: Carbuncle, unspecified: L02.93

## 2015-08-07 HISTORY — DX: Polyneuropathy, unspecified: G62.9

## 2015-08-07 HISTORY — DX: Localized edema: R60.0

## 2015-08-07 HISTORY — DX: Dependence on other enabling machines and devices: Z99.89

## 2015-08-07 HISTORY — DX: Personal history of other specified conditions: Z87.898

## 2015-08-07 HISTORY — DX: Personal history of traumatic brain injury: Z87.820

## 2015-08-07 HISTORY — PX: MASS EXCISION: SHX2000

## 2015-08-07 HISTORY — DX: Personal history of (healed) traumatic fracture: Z87.81

## 2015-08-07 HISTORY — DX: Other chronic pain: G89.29

## 2015-08-07 HISTORY — DX: Other cervical disc degeneration, unspecified cervical region: M50.30

## 2015-08-07 HISTORY — DX: Presence of spectacles and contact lenses: Z97.3

## 2015-08-07 HISTORY — DX: Essential (primary) hypertension: I10

## 2015-08-07 HISTORY — DX: Low back pain: M54.5

## 2015-08-07 HISTORY — DX: Other cervical disc displacement, unspecified cervical region: M50.20

## 2015-08-07 HISTORY — DX: Obstructive sleep apnea (adult) (pediatric): G47.33

## 2015-08-07 HISTORY — DX: Low back pain, unspecified: M54.50

## 2015-08-07 HISTORY — DX: Furuncle, unspecified: L02.92

## 2015-08-07 HISTORY — DX: Personal history of other diseases of the respiratory system: Z87.09

## 2015-08-07 HISTORY — DX: Disorder of the skin and subcutaneous tissue, unspecified: L98.9

## 2015-08-07 LAB — POCT I-STAT, CHEM 8
BUN: 19 mg/dL (ref 6–20)
CHLORIDE: 102 mmol/L (ref 101–111)
CREATININE: 0.9 mg/dL (ref 0.61–1.24)
Calcium, Ion: 1.3 mmol/L — ABNORMAL HIGH (ref 1.12–1.23)
Glucose, Bld: 130 mg/dL — ABNORMAL HIGH (ref 65–99)
HEMATOCRIT: 47 % (ref 39.0–52.0)
Hemoglobin: 16 g/dL (ref 13.0–17.0)
POTASSIUM: 4.2 mmol/L (ref 3.5–5.1)
SODIUM: 141 mmol/L (ref 135–145)
TCO2: 28 mmol/L (ref 0–100)

## 2015-08-07 LAB — GLUCOSE, CAPILLARY: Glucose-Capillary: 133 mg/dL — ABNORMAL HIGH (ref 65–99)

## 2015-08-07 SURGERY — EXCISION MASS
Anesthesia: General | Site: Buttocks

## 2015-08-07 MED ORDER — PROPOFOL 10 MG/ML IV BOLUS
INTRAVENOUS | Status: DC | PRN
  Administered 2015-08-07: 200 mg via INTRAVENOUS

## 2015-08-07 MED ORDER — FENTANYL CITRATE (PF) 250 MCG/5ML IJ SOLN
INTRAMUSCULAR | Status: AC
  Filled 2015-08-07: qty 5

## 2015-08-07 MED ORDER — GENTAMICIN IN SALINE 1-0.9 MG/ML-% IV SOLN
100.0000 mg | INTRAVENOUS | Status: DC
  Filled 2015-08-07: qty 100

## 2015-08-07 MED ORDER — OXYCODONE HCL 5 MG/5ML PO SOLN
5.0000 mg | Freq: Once | ORAL | Status: DC | PRN
  Filled 2015-08-07: qty 5

## 2015-08-07 MED ORDER — FENTANYL CITRATE (PF) 100 MCG/2ML IJ SOLN
INTRAMUSCULAR | Status: DC | PRN
Start: 2015-08-07 — End: 2015-08-07
  Administered 2015-08-07: 100 ug via INTRAVENOUS

## 2015-08-07 MED ORDER — HYDROCODONE-ACETAMINOPHEN 10-325 MG PO TABS: 1.0000 | ORAL_TABLET | ORAL | Status: DC | PRN

## 2015-08-07 MED ORDER — MIDAZOLAM HCL 5 MG/5ML IJ SOLN
INTRAMUSCULAR | Status: DC | PRN
  Administered 2015-08-07: 2 mg via INTRAVENOUS

## 2015-08-07 MED ORDER — LIDOCAINE HCL 4 % EX SOLN
CUTANEOUS | Status: DC | PRN
  Administered 2015-08-07: 100 mL via TOPICAL

## 2015-08-07 MED ORDER — DEXAMETHASONE SODIUM PHOSPHATE 10 MG/ML IJ SOLN
INTRAMUSCULAR | Status: AC
  Filled 2015-08-07: qty 1

## 2015-08-07 MED ORDER — BUPIVACAINE-EPINEPHRINE 0.25% -1:200000 IJ SOLN
INTRAMUSCULAR | Status: DC | PRN
  Administered 2015-08-07: 30 mL

## 2015-08-07 MED ORDER — GENTAMICIN SULFATE 40 MG/ML IJ SOLN
460.0000 mg | Freq: Once | INTRAVENOUS | Status: AC
  Administered 2015-08-07: 460 mg via INTRAVENOUS
  Filled 2015-08-07: qty 11.5

## 2015-08-07 MED ORDER — FENTANYL CITRATE (PF) 100 MCG/2ML IJ SOLN
INTRAMUSCULAR | Status: AC
  Filled 2015-08-07: qty 2

## 2015-08-07 MED ORDER — SUCCINYLCHOLINE CHLORIDE 20 MG/ML IJ SOLN
INTRAMUSCULAR | Status: DC | PRN
  Administered 2015-08-07: 100 mg via INTRAVENOUS

## 2015-08-07 MED ORDER — HYDROMORPHONE HCL 1 MG/ML IJ SOLN
0.2500 mg | INTRAMUSCULAR | Status: DC | PRN
  Filled 2015-08-07: qty 1

## 2015-08-07 MED ORDER — BUPIVACAINE LIPOSOME 1.3 % IJ SUSP
INTRAMUSCULAR | Status: DC | PRN
  Administered 2015-08-07: 20 mL

## 2015-08-07 MED ORDER — PROPOFOL 10 MG/ML IV BOLUS
INTRAVENOUS | Status: AC
  Filled 2015-08-07: qty 20

## 2015-08-07 MED ORDER — CHLORHEXIDINE GLUCONATE 4 % EX LIQD
1.0000 "application " | Freq: Once | CUTANEOUS | Status: DC
  Filled 2015-08-07: qty 15

## 2015-08-07 MED ORDER — OXYCODONE HCL 5 MG PO TABS
5.0000 mg | ORAL_TABLET | Freq: Once | ORAL | Status: DC | PRN
  Filled 2015-08-07: qty 1

## 2015-08-07 MED ORDER — SODIUM CHLORIDE 0.9 % IJ SOLN
INTRAMUSCULAR | Status: DC | PRN
  Administered 2015-08-07: 50 mL

## 2015-08-07 MED ORDER — LACTATED RINGERS IV SOLN
INTRAVENOUS | Status: DC
  Administered 2015-08-07: 12:00:00 via INTRAVENOUS
  Filled 2015-08-07: qty 1000

## 2015-08-07 MED ORDER — ONDANSETRON HCL 4 MG/2ML IJ SOLN
INTRAMUSCULAR | Status: AC
  Filled 2015-08-07: qty 2

## 2015-08-07 MED ORDER — ONDANSETRON HCL 4 MG/2ML IJ SOLN
4.0000 mg | Freq: Four times a day (QID) | INTRAMUSCULAR | Status: DC | PRN
  Filled 2015-08-07: qty 2

## 2015-08-07 MED ORDER — CLINDAMYCIN PHOSPHATE 600 MG/50ML IV SOLN
600.0000 mg | INTRAVENOUS | Status: AC
  Administered 2015-08-07: 600 mg via INTRAVENOUS
  Filled 2015-08-07: qty 50

## 2015-08-07 MED ORDER — BUPIVACAINE LIPOSOME 1.3 % IJ SUSP
20.0000 mL | INTRAMUSCULAR | Status: DC
  Filled 2015-08-07: qty 20

## 2015-08-07 MED ORDER — NEOSTIGMINE METHYLSULFATE 10 MG/10ML IV SOLN
INTRAVENOUS | Status: AC
  Filled 2015-08-07: qty 1

## 2015-08-07 MED ORDER — CLINDAMYCIN PHOSPHATE 600 MG/50ML IV SOLN
INTRAVENOUS | Status: AC
  Filled 2015-08-07: qty 50

## 2015-08-07 MED ORDER — LIDOCAINE HCL (CARDIAC) 20 MG/ML IV SOLN
INTRAVENOUS | Status: AC
  Filled 2015-08-07: qty 5

## 2015-08-07 MED ORDER — MIDAZOLAM HCL 2 MG/2ML IJ SOLN
INTRAMUSCULAR | Status: AC
  Filled 2015-08-07: qty 2

## 2015-08-07 MED ORDER — ONDANSETRON HCL 4 MG/2ML IJ SOLN
INTRAMUSCULAR | Status: DC | PRN
  Administered 2015-08-07: 4 mg via INTRAVENOUS

## 2015-08-07 MED ORDER — KETOROLAC TROMETHAMINE 30 MG/ML IJ SOLN
INTRAMUSCULAR | Status: DC | PRN
  Administered 2015-08-07: 30 mg via INTRAVENOUS

## 2015-08-07 SURGICAL SUPPLY — 61 items
BENZOIN TINCTURE PRP APPL 2/3 (GAUZE/BANDAGES/DRESSINGS) IMPLANT
BLADE CLIPPER SURG (BLADE) ×3 IMPLANT
BLADE HEX COATED 2.75 (ELECTRODE) ×3 IMPLANT
BLADE SURG 10 STRL SS (BLADE) ×3 IMPLANT
BLADE SURG 15 STRL LF DISP TIS (BLADE) ×1 IMPLANT
BLADE SURG 15 STRL SS (BLADE) ×2
BRIEF STRETCH FOR OB PAD LRG (UNDERPADS AND DIAPERS) IMPLANT
CANISTER SUCTION 1200CC (MISCELLANEOUS) ×3 IMPLANT
CANISTER SUCTION 2500CC (MISCELLANEOUS) IMPLANT
CHLORAPREP W/TINT 26ML (MISCELLANEOUS) IMPLANT
CLEANER CAUTERY TIP 5X5 PAD (MISCELLANEOUS) ×1 IMPLANT
CLOSURE WOUND 1/2 X4 (GAUZE/BANDAGES/DRESSINGS) ×1
COVER BACK TABLE 60X90IN (DRAPES) ×3 IMPLANT
COVER MAYO STAND STRL (DRAPES) ×3 IMPLANT
DRAPE LAPAROTOMY 100X72 PEDS (DRAPES) ×3 IMPLANT
DRSG PAD ABDOMINAL 8X10 ST (GAUZE/BANDAGES/DRESSINGS) ×3 IMPLANT
DRSG TEGADERM 4X4.75 (GAUZE/BANDAGES/DRESSINGS) ×3 IMPLANT
ELECT BLADE 6.5 .24CM SHAFT (ELECTRODE) ×3 IMPLANT
ELECT REM PT RETURN 9FT ADLT (ELECTROSURGICAL) ×3
ELECTRODE REM PT RTRN 9FT ADLT (ELECTROSURGICAL) ×1 IMPLANT
GAUZE PACKING IODOFORM 2 (PACKING) IMPLANT
GAUZE SPONGE 4X4 12PLY STRL (GAUZE/BANDAGES/DRESSINGS) IMPLANT
GAUZE SPONGE 4X4 16PLY XRAY LF (GAUZE/BANDAGES/DRESSINGS) ×3 IMPLANT
GLOVE BIOGEL PI IND STRL 7.5 (GLOVE) ×1 IMPLANT
GLOVE BIOGEL PI INDICATOR 7.5 (GLOVE) ×2
GLOVE ECLIPSE 8.0 STRL XLNG CF (GLOVE) ×3 IMPLANT
GLOVE INDICATOR 7.5 STRL GRN (GLOVE) ×6 IMPLANT
GLOVE INDICATOR 8.0 STRL GRN (GLOVE) ×3 IMPLANT
GOWN STRL REUS W/ TWL XL LVL3 (GOWN DISPOSABLE) ×1 IMPLANT
GOWN STRL REUS W/TWL LRG LVL3 (GOWN DISPOSABLE) ×3 IMPLANT
GOWN STRL REUS W/TWL XL LVL3 (GOWN DISPOSABLE) ×2
KIT ROOM TURNOVER WOR (KITS) ×3 IMPLANT
NEEDLE HYPO 22GX1.5 SAFETY (NEEDLE) IMPLANT
NS IRRIG 500ML POUR BTL (IV SOLUTION) ×3 IMPLANT
PACK BASIN DAY SURGERY FS (CUSTOM PROCEDURE TRAY) ×3 IMPLANT
PAD ABD 8X10 STRL (GAUZE/BANDAGES/DRESSINGS) ×3 IMPLANT
PAD CLEANER CAUTERY TIP 5X5 (MISCELLANEOUS) ×2
PENCIL BUTTON HOLSTER BLD 10FT (ELECTRODE) ×3 IMPLANT
SPONGE GAUZE 2X2 8PLY STER LF (GAUZE/BANDAGES/DRESSINGS) ×1
SPONGE GAUZE 2X2 8PLY STRL LF (GAUZE/BANDAGES/DRESSINGS) ×2 IMPLANT
SPONGE LAP 4X18 X RAY DECT (DISPOSABLE) IMPLANT
STRIP CLOSURE SKIN 1/2X4 (GAUZE/BANDAGES/DRESSINGS) ×2 IMPLANT
SUT MNCRL AB 3-0 PS2 18 (SUTURE) ×3 IMPLANT
SUT MNCRL AB 4-0 PS2 18 (SUTURE) IMPLANT
SUT SILK 2 0 SH (SUTURE) ×3 IMPLANT
SUT VIC AB 2-0 SH 18 (SUTURE) ×6 IMPLANT
SUT VIC AB 2-0 SH 27 (SUTURE) ×6
SUT VIC AB 2-0 SH 27XBRD (SUTURE) ×3 IMPLANT
SUT VIC AB 3-0 SH 18 (SUTURE) IMPLANT
SUT VIC AB 3-0 SH 27 (SUTURE)
SUT VIC AB 3-0 SH 27X BRD (SUTURE) IMPLANT
SYR BULB IRRIGATION 50ML (SYRINGE) ×3 IMPLANT
SYR CONTROL 10ML LL (SYRINGE) IMPLANT
TAPE PAPER 2X10 WHT MICROPORE (GAUZE/BANDAGES/DRESSINGS) ×3 IMPLANT
TAPE UMBILICAL COTTON 1/8X30 (MISCELLANEOUS) ×3 IMPLANT
TOWEL OR 17X24 6PK STRL BLUE (TOWEL DISPOSABLE) ×6 IMPLANT
TRAY DSU PREP LF (CUSTOM PROCEDURE TRAY) IMPLANT
TUBE CONNECTING 12'X1/4 (SUCTIONS) ×1
TUBE CONNECTING 12X1/4 (SUCTIONS) ×2 IMPLANT
WATER STERILE IRR 500ML POUR (IV SOLUTION) IMPLANT
YANKAUER SUCT BULB TIP NO VENT (SUCTIONS) ×3 IMPLANT

## 2015-08-07 NOTE — Op Note (Signed)
08/07/2015  2:35 PM  PATIENT:  [name]  50 y.o. male  Patient Care Team: Joanna Puff, MD as PCP - General (Family Medicine) Carney Living, MD as Attending Physician (Family Medicine) Karie Soda, MD as Consulting Physician (General Surgery) Jene Every, MD as Consulting Physician (Orthopedic Surgery)  PRE-OPERATIVE DIAGNOSIS:  chronically inflamed subcutaneous tissue on lumbosacral spine.  Probable pilonidal disease.  POST-OPERATIVE DIAGNOSIS:  chronically inflamed subcutaneous tissue on lumbosacral spine.  Probable pilonidal disease.  PROCEDURE:  Procedure(s): EXCISION OF LOWER BACK TISSUE/ PILONIDAL DISEASE.  SURGEON:   Karie Soda, MD  ASSISTANT: RN   ANESTHESIA:   local and general  EBL:  Total I/O In: 1290 [P.O.:240; I.V.:1050] Out: 25 [Blood:25]  Delay start of Pharmacological VTE agent (>24hrs) due to surgical blood loss or risk of bleeding:  no  DRAINS:  2 WICKS (umbilical tape) IN THE LOWER BACK secured with silk suture  1.  Right =superificial SQ 2.  Left = deep   SPECIMEN:  Source of Specimen:  PILONIDAL DISEASE  DISPOSITION OF SPECIMEN:  PATHOLOGY  COUNTS:  YES  PLAN OF CARE: Discharge to home after PACU  PATIENT DISPOSITION:  PACU - hemodynamically stable.  INDICATION: Pleasant person with chronic drainage in the intergluteal space and pits suspicious for pilonidal disease.  I recommended excision with layered closure over wicks:  The anatomy of the intragluteal cleft was discussed. Pathophysiology of pilonidal disease was discussed. The importance of keeping hairs trimmed to avoid recurrence was discussed. Discussion of options such as curretage, excision with closure vs leaving open was discussed. Risks of infection with need for incision and drainage & antibiotics were discussed.  I noted a good likelihood this will help address the problem.    At this point, I think the patient would best served with considering surgery to  excise the diseased tissue. I will make an attempt to close but it may need to be left open to allow it to heal with secondary intention and wound packing. Possible recurrences need reoperation or different techniques were discussed as well. I noted that recurrence is higher with poor compliance on hair removal hygiene & overall health.  Risks of bleeding, infection, injury to other organs, need for repair of tissues / organs, need for further treatment, and other risks discussed. The patient's questions were answered. The patient agrees to proceed.  OR FINDINGS: Intragluteal upper midline pits & left paragluteal scarring consistent with chronic pilonidal disease.  No major abscess.  Layered closure with absorbable suture.  Wicks as noted above.  The right lower back wick is the superficial one.  That is the one that should be removed first in 10 days.  DESCRIPTION:   Informed consent was confirmed.  Patient underwent general anesthesia without difficulty.  She was positioned prone.  The lower back and intergluteal region was prepped and draped in the sterile fashion.  Excess air is removed.  Surgical timeout confirmed our plan.  I did examination with findings noted above.  I did a paramedian vertical fusiform excision of the pilonidal diseas, focusing on the left side where most of the scarring was.  I came down to the sacrum to remove all scar tissue and sinus tracts/pilonidal disease.  Specimen removed intact.  I created deep flaps off the skin and subcutaneous tissue fascia and superficial gluteus muscle off the sacrum 4 centimeters laterally on both sides to have good mobility for thick flaps.  I did copious irrigation after I had assured hemostasis.  I placed a deep wick overlying the sacrum.  I closed at the level of the gluteal fascia and muscle using interrupted 2-0 Vicryl suture to good result.  I few tacked to the sacral periosteum to close the dead space.  I placed a wick in the  superficial region.  I did another layer closure of absorbable Vicryl suture at Scarpa's fascia as he was thick.  I did another layered closure of deep dermal sutures interrupted.  I then closed the skin with a running Monocryl subcuticular suture.  I then used steristrips to seal the incision.  I had secured the wicks with silk suture.  I placed a sterile dressing over the wicks.  Patient was extubated & sent to the recovery room.  I had discussed postop care with the patient.  Patient had received instructions in the office.  I updated the patient's status to the atient's significant other.  Recommendations were made.  Questions were answered.  She expressed understanding & appreciation.   Ardeth Sportsman, M.D., F.A.C.S. Gastrointestinal and Minimally Invasive Surgery [facility] [address], [address] [place], [place] [address] [phone] Main / Paging

## 2015-08-07 NOTE — H&P (Signed)
[name] 06/25/2015 9:32 AM Location: [facility] Patient #: [id number] DOB: [birth year] Married / Language: English / Race: White Male   History of Present Illness   The patient is a 50 year old male who presents with a pilonidal cyst. Note for "Pilonidal cyst": Patient sent by primary care physician for concern of repeated subcutaneous infections and tailbone concerning for chronic pilonidal disease.  He's had a long history of subcutaneous skin infections. He used to smoke. He quit 4 years ago. Diagnosed with diabetes. Hemoglobin A1c 6.6 on insulin. To a more vegetarian diet. Most of inspections of gone away. Also history history of a lot of back issues. He squired back surgeries by [facility] orthopedics. Had a lot of scarring and burs. Mild sciatica to LEFT leg. However these felt not just above his buttocks but below incision. Intermittently swells and gets very painful. Had to be drained a few times. Another flare. Permit care office recommended considering surgical evaluation. Sometimes the infections can be controlled clindamycin. Sometimes requires drainage. Feels like some needs to be done. He is ready for surgery  Other Problems Fay Records, CMA; 06/25/2015 9:33 AM) Anxiety Disorder Arthritis Back Pain Diabetes Mellitus Hemorrhoids Seizure Disorder Sleep Apnea  Past Surgical History Fay Records, CMA; 06/25/2015 9:33 AM) Appendectomy Oral Surgery Spinal Surgery - Lower Back  Diagnostic Studies History Fay Records, CMA; 06/25/2015 9:33 AM) Colonoscopy never  Allergies Fay Records, CMA; 06/25/2015 9:33 AM) Penicillin V *PENICILLINS* MetFORMIN HCl *CHEMICALS* Statins Support *DIETARY PRODUCTS/DIETARY MANAGEMENT PRODUCTS*  Medication History Fay Records, CMA; 06/25/2015 9:34 AM) Hydrocodone-Acetaminophen (10-325MG  Tablet, Oral) Active. Accu-Chek Aviva Plus (In Vitro) Active. HumaLOG (100UNIT/ML Solution, Subcutaneous)  Active. HydroCHLOROthiazide (  Tablet, Oral) Active. Lantus (100UNIT/ML Solution, Subcutaneous) Active. Lidocaine (5% Ointment, External) Active. Gabapentin (  Tablet, Oral) Active. Voltaren (1% Gel, External) Active. Aspirin (  Tablet, Oral) Active. Medications Reconciled  Social History Fay Records, [place]; 06/25/2015 9:33 AM) Alcohol use Remotely quit alcohol use. Caffeine use Coffee. Illicit drug use Remotely quit drug use. Tobacco use Former smoker.  Family History Fay Records, [place]; 06/25/2015 9:33 AM) Arthritis Father, Mother, Sister. Depression Mother, Son. Diabetes Mellitus Mother. Heart Disease Mother. Heart disease in male family member before age 3 Hypertension Mother. Respiratory Condition Mother.    Review of Systems Fay Records CMA; 06/25/2015 9:33 AM) General Present- Weight Gain. Not Present- Appetite Loss, Chills, Fatigue, Fever, Night Sweats and Weight Loss. Skin Present- Dryness and Non-Healing Wounds. Not Present- Change in Wart/Mole, Hives, Jaundice, New Lesions, Rash and Ulcer. HEENT Present- Hearing Loss, Ringing in the Ears and Wears glasses/contact lenses. Not Present- Earache, Hoarseness, Nose Bleed, Oral Ulcers, Seasonal Allergies, Sinus Pain, Sore Throat, Visual Disturbances and Yellow Eyes. Respiratory Present- Snoring. Not Present- Bloody sputum, Chronic Cough, Difficulty Breathing and Wheezing. Cardiovascular Present- Leg Cramps and Swelling of Extremities. Not Present- Chest Pain, Difficulty Breathing Lying Down, Palpitations, Rapid Heart Rate and Shortness of Breath. Gastrointestinal Present- Bloating. Not Present- Abdominal Pain, Bloody Stool, Change in Bowel Habits, Chronic diarrhea, Constipation, Difficulty Swallowing, Excessive gas, Gets full quickly at meals, Hemorrhoids, Indigestion, Nausea, Rectal Pain and Vomiting. Male Genitourinary Present- Frequency and Urgency. Not Present- Blood in Urine, Change in Urinary  Stream, Impotence, Nocturia, Painful Urination and Urine Leakage. Musculoskeletal Present- Back Pain, Joint Pain and Swelling of Extremities. Not Present- Joint Stiffness, Muscle Pain and Muscle Weakness. Neurological Present- Numbness, Tingling, Trouble walking and Weakness. Not Present- Decreased Memory, Fainting, Headaches, Seizures and Tremor. Psychiatric Not Present- Anxiety, Bipolar, Change in Sleep Pattern, Depression, Fearful and  Frequent crying. Hematology Not Present- Easy Bruising, Excessive bleeding, Gland problems, HIV and Persistent Infections.  Vitals Fay Records CMA; 06/25/2015 9:35 AM) 06/25/2015 9:35 AM Weight: 274 lb Height: 68in Body Surface Area: 2.34 m Body Mass Index: 41.66 kg/m  Temp.: 98.14F(Temporal)  Pulse: 68 (Regular)  BP: 136/78 (Sitting, Left Arm, Standard)       Physical Exam Ardeth Sportsman MD; 06/25/2015 10:20 AM) General Mental Status-Alert. General Appearance-Not in acute distress, Not Sickly. Orientation-Oriented X3. Hydration-Well hydrated. Voice-Normal. Note: Morbidly obese. Walks with a cane.   Integumentary Global Assessment Upon inspection and palpation of skin surfaces of the - Axillae: non-tender, no inflammation or ulceration, no drainage. and Distribution of scalp and body hair is normal. General Characteristics Temperature - normal warmth is noted. Note: Numerous tattoos on trunk and extremities.   Head and Neck Head-normocephalic, atraumatic with no lesions or palpable masses. Face Global Assessment - atraumatic, no absence of expression. Neck Global Assessment - no abnormal movements, no bruit auscultated on the right, no bruit auscultated on the left, no decreased range of motion, non-tender. Trachea-midline. Thyroid Gland Characteristics - non-tender.  Eye Eyeball - Left-Extraocular movements intact, No Nystagmus. Eyeball - Right-Extraocular movements intact, No Nystagmus. Cornea -  Left-No Hazy. Cornea - Right-No Hazy. Sclera/Conjunctiva - Left-No scleral icterus, No Discharge. Sclera/Conjunctiva - Right-No scleral icterus, No Discharge. Pupil - Left-Direct reaction to light normal. Pupil - Right-Direct reaction to light normal.  ENMT Ears Pinna - Left - no drainage observed, no generalized tenderness observed. Right - no drainage observed, no generalized tenderness observed. Nose and Sinuses External Inspection of the Nose - no destructive lesion observed. Inspection of the nares - Left - quiet respiration. Right - quiet respiration. Mouth and Throat Lips - Upper Lip - no fissures observed, no pallor noted. Lower Lip - no fissures observed, no pallor noted. Nasopharynx - no discharge present. Oral Cavity/Oropharynx - Tongue - no dryness observed. Oral Mucosa - no cyanosis observed. Hypopharynx - no evidence of airway distress observed.  Chest and Lung Exam Inspection Movements - Normal and Symmetrical. Accessory muscles - No use of accessory muscles in breathing. Palpation Palpation of the chest reveals - Non-tender. Auscultation Breath sounds - Normal and Clear.  Cardiovascular Auscultation Rhythm - Regular. Murmurs & Other Heart Sounds - Auscultation of the heart reveals - No Murmurs and No Systolic Clicks.  Abdomen Inspection Inspection of the abdomen reveals - No Visible peristalsis and No Abnormal pulsations. Umbilicus - No Bleeding, No Urine drainage. Palpation/Percussion Palpation and Percussion of the abdomen reveal - Soft, Non Tender, No Rebound tenderness, No Rigidity (guarding) and No Cutaneous hyperesthesia. Note: Morbidly obese but soft. Mild diastases recti. Sensitive umbilicus but no hernia   Male Genitourinary Sexual Maturity Tanner 5 - Adult hair pattern and Adult penile size and shape. Note: Prominent mons pubis but no infection. No panniculitis. No abscess.   Rectal Note: Perirectal skin clear. No abscess or  infection. Deep intergluteal cleft with mild hair burden. Perhaps 2 small pits but no disease. Scar on LEFT intergluteal region but no active area.  Low lumbar incision. Between this region and the upper sacrum is a 4 x 3 cm somewhat firm and sensitive subcutaneous mass. No cellulitis. No fluctuance or purulence. But tender.   Peripheral Vascular Upper Extremity Inspection - Left - No Cyanotic nailbeds, Not Ischemic. Right - No Cyanotic nailbeds, Not Ischemic.  Neurologic Neurologic evaluation reveals -normal attention span and ability to concentrate, able to name objects and repeat phrases. Appropriate fund  of knowledge , normal sensation and normal coordination. Mental Status Affect - not angry, not paranoid. Cranial Nerves-Normal Bilaterally. Gait-Normal.  Neuropsychiatric Mental status exam performed with findings of-able to articulate well with normal speech/language, rate, volume and coherence, thought content normal with ability to perform basic computations and apply abstract reasoning and no evidence of hallucinations, delusions, obsessions or homicidal/suicidal ideation.  Musculoskeletal Global Assessment Spine, Ribs and Pelvis - no instability, subluxation or laxity. Right Upper Extremity - no instability, subluxation or laxity.  Lymphatic Head & Neck  General Head & Neck Lymphatics: Bilateral - Description - No Localized lymphadenopathy. Axillary  General Axillary Region: Bilateral - Description - No Localized lymphadenopathy. Femoral & Inguinal  Generalized Femoral & Inguinal Lymphatics: Left - Description - No Localized lymphadenopathy. Right - Description - No Localized lymphadenopathy.    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 Simmering for a long time. Never fully healed.  Suspicious of cavity on MRI of spine in August. Not consistent with sarcoma or other tumor.  I think this area needs to be removed. Discuss my partner Dr. Gerrit Friends. The lack of redundant skin, suspect we'll have to leave it partially or completely open to let it granulate closed.  I will send a prescription for clindamycin in case it flares back up and an until can calm down. He would like to try and wait a few weeks until he has some family and other issues taken care of.  I noted the fact that he quit smoking in his tried to lose weight and he has aggressive diabetic control are good insurance policies against further attacks. Hopefully we can minimize further infections once we get this last area taking care of Current Plans The anatomy of the gluteal and sacral region was discussed. Pathophysiology of pilonidal disease due to ingrown hairs was discussed. Importance of good hygiene discussed. Importance of keeping hairs trimmed in the intergluteal crease from anus to top of sacrum/tailbone spine noted. Need for good hygiene stressed. Use of electric razor or nose hair trimmers to keep the hairs trimmed discussed.  Risk of worsening progression needing surgical drainage of abscess or perhaps excision of chronic pilonidal disease with skin flap closure over drains discussed. Possible need for her to leave it open with packing to allow the wound close over several weeks/months discussed. Risks benefits alternatives to surgery discussed as well. Risk of recurrent disease discussed. Patient was expressed understanding. Literature given to patient. We will see if we can control this without an operation first.  Pt Education - [facility] Pilonidal Disease (AT) Pt Education - [facility] Pilonidal Surgery HCI - post op instructions: discussed with patient and provided information. You are being scheduled for surgery - Our schedulers will call you.  You should hear from our office's scheduling department within 5 working  days about the location, date, and time of surgery. We try to make accommodations for patient's preferences in scheduling surgery, but sometimes the OR schedule or the surgeon's schedule prevents [place] from making those accommodations.  If you have not heard from our office [phone] in 5 working days, call the office and ask for your surgeon's nurse.  If you have other questions about your diagnosis, plan, or surgery, call the office and ask for your surgeon's nurse.  Pt Education - [facility] Pain Control ([facility] Trower) Started Clindamycin HCl 300MG , 1 (one) Capsule four times daily, #30, 06/25/2015,  Ref. Andres Labrum, M.D., F.A.C.S. Gastrointestinal and Minimally Invasive Surgery [facility] [address], [address] [place], [place] [address] [phone] Main / Paging

## 2015-08-07 NOTE — Discharge Instructions (Signed)
GENERAL SURGERY: POST OP INSTRUCTIONS  1. DIET: Follow a light bland diet the first 24 hours after arrival home, such as soup, liquids, crackers, etc.  Be sure to include lots of fluids daily.  Avoid fast food or heavy meals as your are more likely to get nauseated.   2. Take your usually prescribed home medications unless otherwise directed. 3. PAIN CONTROL: a. Pain is best controlled by a usual combination of three different methods TOGETHER: i. Ice/Heat ii. Over the counter pain medication iii. Prescription pain medication b. Most patients will experience some swelling and bruising around the incisions.  Ice packs or heating pads (30-60 minutes up to 6 times a day) will help. Use ice for the first few days to help decrease swelling and bruising, then switch to heat to help relax tight/sore spots and speed recovery.  Some people prefer to use ice alone, heat alone, alternating between ice & heat.  Experiment to what works for you.  Swelling and bruising can take several weeks to resolve.   c. It is helpful to take an over-the-counter pain medication regularly for the first few weeks.  Choose one of the following that works best for you: i. Naproxen (Aleve, etc)  Two 220mg  tabs twice a day ii. Ibuprofen (Advil, etc) Three 200mg  tabs four times a day (every meal & bedtime) iii. Acetaminophen (Tylenol, etc) 500-650mg  four times a day (every meal & bedtime) d. A  prescription for pain medication (such as oxycodone, hydrocodone, etc) should be given to you upon discharge.  Take your pain medication as prescribed.  i. If you are having problems/concerns with the prescription medicine (does not control pain, nausea, vomiting, rash, itching, etc), please call us [phone] to see if we need to switch you to a different pain medicine that will work better for you and/or control your side effect better. ii. If you need a refill on your pain medication, please contact your pharmacy.  They will contact our  office to request authorization. Prescriptions will not be filled after 5 pm or on week-ends. 4. Avoid getting constipated.  Between the surgery and the pain medications, it is common to experience some constipation.  Increasing fluid intake and taking a fiber supplement (such as Metamucil, Citrucel, FiberCon, MiraLax, etc) 1-2 times a day regularly will usually help prevent this problem from occurring.  A mild laxative (prune juice, Milk of Magnesia, MiraLax, etc) should be taken according to package directions if there are no bowel movements after 48 hours.    5. Wash / shower every day.  You may shower over the dressings as they are waterproof.  Continue to shower over incision(s) after the dressing is off.  6. Remove your outside bandages the next day after surgery.  If your incision was left open with a wound, see the separate "Wound Care Instructions."  If your incision ois closed, you may leave most of your incision open to air.  You will most likely skin tapes (Steri Strips) covering the incision(s).  Leave them on until one week, then remove.  You most likely have small ribbon wicks next to the upper part of your incision that will drain fluid (usually thinly bloody).  Replace a dressing/Band-Aid to cover the wicks that drain 1-2/day.  Keep the area clean & dry.      7. ACTIVITIES as tolerated:   a. You may resume regular (light) daily activities beginning the next day--such as daily self-care, walking, climbing stairs--gradually increasing activities as tolerated.  If you can walk 30 minutes without difficulty, it is safe to try more intense activity such as jogging, treadmill, bicycling, low-impact aerobics, swimming, etc. b. Save the most intensive and strenuous activity for last such as sit-ups, heavy lifting, contact sports, etc  Refrain from any heavy lifting or straining until you are off narcotics for pain control.   c. DO NOT PUSH THROUGH PAIN.  Let pain be your guide: If it hurts to  do something, don't do it.  Pain is your body warning you to avoid that activity for another week until the pain goes down. d. You may drive when you are no longer taking prescription pain medication, you can comfortably wear a seatbelt, and you can safely maneuver your car and apply brakes. e. Bonita Quin may have sexual intercourse when it is comfortable.  8. FOLLOW UP in our office a. Please call [facility] at [phone] to set up an appointment to see your surgeon in the office for a follow-up appointment approximately 7-10 days after your surgery to have one of your ribbon wicks removed by the office nurse.  The other remaining wick usually is removed the next week after that when you meet your surgeon again for a post-operative visit. b. Make sure that you call for these appointments the day you arrive home to ensure a convenient appointment time. 9. IF YOU HAVE DISABILITY OR FAMILY LEAVE FORMS, BRING THEM TO THE OFFICE FOR PROCESSING.  DO NOT GIVE THEM TO YOUR DOCTOR.   WHEN TO CALL us [phone]: 1. Poor pain control 2. Reactions / problems with new medications (rash/itching, nausea, etc)  3. Fever over 101.5 F (38.5 C) 4. Worsening swelling or bruising 5. Continued bleeding from incision. 6. Increased pain, redness, or drainage from the incision 7. Difficulty breathing / swallowing   The clinic staff is available to answer your questions during regular business hours (8:30am-5pm).  Please dont hesitate to call and ask to speak to one of our nurses for clinical concerns.   If you have a medical emergency, go to the nearest emergency room or call 911.  A surgeon from [facility] is always on call at the [facility], [place] [address], [place], [place]  [address] ? MAIN: [phone] ? TOLL FREE: [phone] ?  FAX [phone] [email or web address]  Managing Pain  Pain after surgery or related to activity is often  due to strain/injury to muscle, tendon, nerves and/or incisions.  This pain is usually short-term and will improve in a few months.   Many people find it helpful to do the following things TOGETHER to help speed the process of healing and to get back to regular activity more quickly:  1. Avoid heavy physical activity at first a. No lifting greater than 20 pounds at first, then increase to lifting as tolerated over the next few weeks b. Do not push through the pain.  Listen to your body and avoid positions and maneuvers than reproduce the pain.  Wait a few days before trying something more intense c. Walking is okay as tolerated, but go slowly and stop when getting sore.  If you can walk 30 minutes without stopping or pain, you can try more intense activity (running, jogging, aerobics, cycling, swimming, treadmill, sex, sports, weightlifting, etc ) d. Remember: If it hurts to do it, then dont do it!  2. Take Anti-inflammatory medication a. Choose ONE of the following over-the-counter medications: i.  Acetaminophen  tabs (Tylenol) 1-2 pills with every meal and just before bedtime (avoid if you have liver problems) ii.            Naproxen  tabs (ex. Aleve) 1-2 pills twice a day (avoid if you have kidney, stomach, IBD, or bleeding problems) iii. Ibuprofen  tabs (ex. Advil, Motrin) 3-4 pills with every meal and just before bedtime (avoid if you have kidney, stomach, IBD, or bleeding problems) b. Take with food/snack around the clock for 1-2 weeks i. This helps the muscle and nerve tissues become less irritable and calm down faster  3. Use a Heating pad or Ice/Cold Pack a. 4-6 times a day b. May use warm bath/hottub  or showers  4. Try Gentle Massage and/or Stretching  a. at the area of pain many times a day b. stop if you feel pain - do not overdo it  Try these steps together to help you body heal faster and avoid making things get worse.  Doing just one of these  things may not be enough.    If you are not getting better after two weeks or are noticing you are getting worse, contact our office for further advice; we may need to re-evaluate you & see what other things we can do to help.  GETTING TO GOOD BOWEL HEALTH. Irregular bowel habits such as constipation and diarrhea can lead to many problems over time.  Having one soft bowel movement a day is the most important way to prevent further problems.  The anorectal canal is designed to handle stretching and feces to safely manage our ability to get rid of solid waste (feces, poop, stool) out of our body.  BUT, hard constipated stools can act like ripping concrete bricks and diarrhea can be a burning fire to this very sensitive area of our body, causing inflamed hemorrhoids, anal fissures, increasing risk is perirectal abscesses, abdominal pain/bloating, an making irritable bowel worse.      The goal: ONE SOFT BOWEL MOVEMENT A DAY!  To have soft, regular bowel movements:   Drink plenty of fluids, consider 4-6 tall glasses of water a day.    Take plenty of fiber.  Fiber is the undigested part of plant food that passes into the colon, acting s natures broom to encourage bowel motility and movement.  Fiber can absorb and hold large amounts of water. This results in a larger, bulkier stool, which is soft and easier to pass. Work gradually over several weeks up to 6 servings a day of fiber (25g a day even more if needed) in the form of: o Vegetables -- Root (potatoes, carrots, turnips), leafy green (lettuce, salad greens, celery, spinach), or cooked high residue (cabbage, broccoli, etc) o Fruit -- Fresh (unpeeled skin & pulp), Dried (prunes, apricots, cherries, etc ),  or stewed ( applesauce)  o Whole grain breads, pasta, etc (whole wheat)  o Bran cereals   Bulking Agents -- This type of water-retaining fiber generally is easily obtained each day by one of the following:  o Psyllium bran -- The psyllium plant is  remarkable because its ground seeds can retain so much water. This product is available as Metamucil, Konsyl, Effersyllium, Per Diem Fiber, or the less expensive generic preparation in drug and health food stores. Although labeled a laxative, it really is not a laxative.  o Methylcellulose -- This is another fiber derived from wood which also retains water. It is available as Citrucel. o Polyethylene Glycol - and artificial fiber  commonly called Miralax or Glycolax.  It is helpful for people with gassy or bloated feelings with regular fiber o Flax Seed - a less gassy fiber than psyllium  No reading or other relaxing activity while on the toilet. If bowel movements take longer than 5 minutes, you are too constipated  AVOID CONSTIPATION.  High fiber and water intake usually takes care of this.  Sometimes a laxative is needed to stimulate more frequent bowel movements, but   Laxatives are not a good long-term solution as it can wear the colon out.  They can help jump-start bowels if constipated, but should be relied on constantly without discussing with your doctor o Osmotics (Milk of Magnesia, Fleets phosphosoda, Magnesium citrate, MiraLax, GoLytely) are safer than  o Stimulants (Senokot, Castor Oil, Dulcolax, Ex Lax)    o Avoid taking laxatives for more than 7 days in a row.   IF SEVERELY CONSTIPATED, try a Bowel Retraining Program: o Do not use laxatives.  o Eat a diet high in roughage, such as bran cereals and leafy vegetables.  o Drink six (6) ounces of prune or apricot juice each morning.  o Eat two (2) large servings of stewed fruit each day.  o Take one (1) heaping tablespoon of a psyllium-based bulking agent twice a day. Use sugar-free sweetener when possible to avoid excessive calories.  o Eat a normal breakfast.  o Set aside 15 minutes after breakfast to sit on the toilet, but do not strain to have a bowel movement.  o If you do not have a bowel movement by the third day, use an enema  and repeat the above steps.   Controlling diarrhea o Switch to liquids and simpler foods for a few days to avoid stressing your intestines further. o Avoid dairy products (especially milk & ice cream) for a short time.  The intestines often can lose the ability to digest lactose when stressed. o Avoid foods that cause gassiness or bloating.  Typical foods include beans and other legumes, cabbage, broccoli, and dairy foods.  Every person has some sensitivity to other foods, so listen to our body and avoid those foods that trigger problems for you. o Adding fiber (Citrucel, Metamucil, psyllium, Miralax) gradually can help thicken stools by absorbing excess fluid and retrain the intestines to act more normally.  Slowly increase the dose over a few weeks.  Too much fiber too soon can backfire and cause cramping & bloating. o Probiotics (such as active yogurt, Align, etc) may help repopulate the intestines and colon with normal bacteria and calm down a sensitive digestive tract.  Most studies show it to be of mild help, though, and such products can be costly. o Medicines: - Bismuth subsalicylate (ex. Kayopectate, Pepto Bismol) every 30 minutes for up to 6 doses can help control diarrhea.  Avoid if pregnant. - Loperamide (Immodium) can slow down diarrhea.  Start with two tablets (  total) first and then try one tablet every 6 hours.  Avoid if you are having fevers or severe pain.  If you are not better or start feeling worse, stop all medicines and call your doctor for advice o Call your doctor if you are getting worse or not better.  Sometimes further testing (cultures, endoscopy, X-ray studies, bloodwork, etc) may be needed to help diagnose and treat the cause of the diarrhea.  TROUBLESHOOTING IRREGULAR BOWELS 1) Avoid extremes of bowel movements (no bad constipation/diarrhea) 2) Miralax 17gm mixed in 8oz. water or juice-daily. May use BID as needed.  3) Gas-x,Phazyme, etc. as needed for gas &  bloating.  4) Soft,bland diet. No spicy,greasy,fried foods.  5) Prilosec over-the-counter as needed  6) May hold gluten/wheat products from diet to see if symptoms improve.  7)  May try probiotics (Align, Activa, etc) to help calm the bowels down 7) If symptoms become worse call back immediately.  Pilonidal Cyst A pilonidal cyst is a fluid-filled sac. It forms beneath the skin near your tailbone, at the top of the crease of your buttocks. A pilonidal cyst that is not large or infected may not cause symptoms or problems. If the cyst becomes irritated or infected, it may fill with pus. This causes pain and swelling (pilonidal abscess). An infected cyst may need to be treated with medicine, drained, or removed. CAUSES The cause of a pilonidal cyst is not known. One cause may be a hair that grows into your skin (ingrown hair). RISK FACTORS Pilonidal cysts are more common in boys and men. Risk factors include: 1. Having lots of hair near the crease of the buttocks. 2. Being overweight. 3. Having a pilonidal dimple. 4. Wearing tight clothing. 5. Not bathing or showering frequently. 6. Sitting for long periods of time. SIGNS AND SYMPTOMS Signs and symptoms of a pilonidal cyst may include: 1. Redness. 2. Pain and tenderness. 3. Warmth. 4. Swelling. 5. Pus. 6. Fever. DIAGNOSIS Your health care provider may diagnose a pilonidal cyst based on your symptoms and a physical exam. The health care provider may do a blood test to check for infection. If your cyst is draining pus, your health care provider may take a sample of the drainage to be tested at a laboratory. TREATMENT Surgery is the usual treatment for an infected pilonidal cyst. You may also have to take medicines before surgery. The type of surgery you have depends on the size and severity of the infected cyst. The different kinds of surgery include: 1. Incision and drainage. This is a procedure to open and drain the  cyst. 2. Marsupialization. In this procedure, a large cyst or abscess may be opened and kept open by stitching the edges of the skin to the cyst walls. 3. Cyst removal. This procedure involves opening the skin and removing all or part of the cyst. HOME CARE INSTRUCTIONS 1. Follow all of your surgeon's instructions carefully if you had surgery. 2. Take medicines only as directed by your health care provider. 3. If you were prescribed an antibiotic medicine, finish it all even if you start to feel better. 4. Keep the area around your pilonidal cyst clean and dry. 5. Clean the area as directed by your health care provider. Pat the area dry with a clean towel. Do not rub it as this may cause bleeding. 6. Remove hair from the area around the cyst as directed by your health care provider. 7. Do not wear tight clothing or sit in one place for long periods of time. 8. There are many different ways to close and cover an incision, including stitches, skin glue, and adhesive strips. Follow your health care provider's instructions on: 1. Incision care. 2. Bandage (dressing) changes and removal. 3. Incision closure removal. SEEK MEDICAL CARE IF:   You have drainage, redness, swelling, or pain at the site of the cyst.  You have a fever.   This information is not intended to replace advice given to you by your health care provider. Make sure you discuss any questions you have with your health care provider.   Document Released:  05/28/2000 Document Revised: 06/21/2014 Document Reviewed: 10/18/2013 Elsevier Interactive Patient Education 2016 [employer].  Information for Discharge Teaching: EXPAREL (bupivacaine liposome injectable suspension)   Your surgeon gave you EXPAREL(bupivacaine) in your surgical incision to help control your pain after surgery.   EXPAREL is a local anesthetic that provides pain relief by numbing the tissue around the surgical site.  EXPAREL is designed to release pain  medication over time and can control pain for up to 72 hours.  Depending on how you respond to EXPAREL, you may require less pain medication during your recovery.  Possible side effects:  Temporary loss of sensation or ability to move in the area where bupivacaine was injected.  Nausea, vomiting, constipation  Rarely, numbness and tingling in your mouth or lips, lightheadedness, or anxiety may occur.  Call your doctor right away if you think you may be experiencing any of these sensations, or if you have other questions regarding possible side effects.  Follow all other discharge instructions given to you by your surgeon or nurse. Eat a healthy diet and drink plenty of water or other fluids.  If you return to the hospital for any reason within 96 hours following the administration of EXPAREL, please inform your health care providers.   Post Anesthesia Home Care Instructions  Activity: Get plenty of rest for the remainder of the day. A responsible adult should stay with you for 24 hours following the procedure.  For the next 24 hours, DO NOT: -Drive a car -Advertising copywriter -Drink alcoholic beverages -Take any medication unless instructed by your physician -Make any legal decisions or sign important papers.  Meals: Start with liquid foods such as gelatin or soup. Progress to regular foods as tolerated. Avoid greasy, spicy, heavy foods. If nausea and/or vomiting occur, drink only clear liquids until the nausea and/or vomiting subsides. Call your physician if vomiting continues.  Special Instructions/Symptoms: Your throat may feel dry or sore from the anesthesia or the breathing tube placed in your throat during surgery. If this causes discomfort, gargle with warm salt water. The discomfort should disappear within 24 hours.  If you had a scopolamine patch placed behind your ear for the management of post- operative nausea and/or vomiting:  1. The medication in the patch is  effective for 72 hours, after which it should be removed.  Wrap patch in a tissue and discard in the trash. Wash hands thoroughly with soap and water. 2. You may remove the patch earlier than 72 hours if you experience unpleasant side effects which may include dry mouth, dizziness or visual disturbances. 3. Avoid touching the patch. Wash your hands with soap and water after contact with the patch.

## 2015-08-07 NOTE — Transfer of Care (Signed)
Immediate Anesthesia Transfer of Care Note  Patient: [name]  Procedure(s) Performed: Procedure(s): EXCISION OF LOWER BACK TISSUE/ PILONIDAL DISEASE. (N/A)  Patient Location: PACU  Anesthesia Type:General  Level of Consciousness: awake, alert , oriented and patient cooperative  Airway & Oxygen Therapy: Patient Spontanous Breathing and Patient connected to face mask oxygen  Post-op Assessment: Report given to RN and Post -op Vital signs reviewed and stable  Post vital signs: Reviewed and stable SaO2 100% 87-20 BP 142/100  Last Vitals:  Filed Vitals:   08/07/15 1008  BP: 125/84  Pulse: 84  Temp: 37.2 C  Resp: 12    Complications: No apparent anesthesia complications

## 2015-08-07 NOTE — Anesthesia Preprocedure Evaluation (Addendum)
Anesthesia Evaluation  Patient identified by MRN, date of birth, ID band Patient awake    Reviewed: Allergy & Precautions, NPO status , Patient's Chart, lab work & pertinent test results  Airway Mallampati: II   Neck ROM: full    Dental  (+) Missing,    Pulmonary sleep apnea and Continuous Positive Airway Pressure Ventilation , former smoker,    breath sounds clear to auscultation       Cardiovascular Exercise Tolerance: Poor hypertension, Pt. on medications  Rhythm:regular Rate:Normal     Neuro/Psych  Neuromuscular disease    GI/Hepatic   Endo/Other  diabetes, Type obesity  Renal/GU      Musculoskeletal  (+) Arthritis ,   Abdominal   Peds  Hematology   Anesthesia Other Findings   Reproductive/Obstetrics                            Anesthesia Physical Anesthesia Plan  ASA: II  Anesthesia Plan: General   Post-op Pain Management:    Induction: Intravenous  Airway Management Planned: Oral ETT  Additional Equipment:   Intra-op Plan:   Post-operative Plan: Extubation in OR  Informed Consent: I have reviewed the patients History and Physical, chart, labs and discussed the procedure including the risks, benefits and alternatives for the proposed anesthesia with the patient or authorized representative who has indicated his/her understanding and acceptance.     Plan Discussed with: CRNA, Anesthesiologist and Surgeon  Anesthesia Plan Comments:         Anesthesia Quick Evaluation

## 2015-08-07 NOTE — Interval H&P Note (Signed)
History and Physical Interval Note:  08/07/2015 11:52 AM  [name]  has presented today for surgery, with the diagnosis of chronically inflamed subcutaneous tissue on lumbosacral spine.  Probable pilonidal disease.  The various methods of treatment have been discussed with the patient and family. After consideration of risks, benefits and other options for treatment, the patient has consented to  Procedure(s): EXCISION OF LOWER BACK TISSUE/PROBABLY PILONIDAL DISEASE. (N/A) as a surgical intervention .  The patient's history has been reviewed, patient examined, no change in status, stable for surgery.  I have reviewed the patient's chart and labs.  Questions were answered to the patient's satisfaction.     [name] C.

## 2015-08-07 NOTE — Anesthesia Procedure Notes (Signed)
Procedure Name: Intubation Date/Time: 08/07/2015 12:14 PM Performed by: Tyrone Nine Pre-anesthesia Checklist: Patient identified, Timeout performed, Emergency Drugs available, Suction available and Patient being monitored Patient Re-evaluated:Patient Re-evaluated prior to inductionOxygen Delivery Method: Circle system utilized Preoxygenation: Pre-oxygenation with 100% oxygen Intubation Type: IV induction and Rapid sequence Ventilation: Two handed mask ventilation required Grade View: Grade II Tube type: Oral Tube size: 7.0 mm Number of attempts: 1 Airway Equipment and Method: Stylet and Oral airway Placement Confirmation: ETT inserted through vocal cords under direct vision,  positive ETCO2 and breath sounds checked- equal and bilateral Secured at: 23 cm Tube secured with: Tape Dental Injury: Teeth and Oropharynx as per pre-operative assessment  Comments: Full beard, short neck.

## 2015-08-08 ENCOUNTER — Encounter (HOSPITAL_BASED_OUTPATIENT_CLINIC_OR_DEPARTMENT_OTHER): Payer: Self-pay | Admitting: Surgery

## 2015-08-09 NOTE — Anesthesia Postprocedure Evaluation (Signed)
Anesthesia Post Note  Patient: [name]  Procedure(s) Performed: Procedure(s) (LRB): EXCISION OF LOWER BACK TISSUE/ PILONIDAL DISEASE. (N/A)  Patient location during evaluation: PACU Anesthesia Type: General Level of consciousness: awake and alert Pain management: pain level controlled Vital Signs Assessment: post-procedure vital signs reviewed and stable Respiratory status: spontaneous breathing, nonlabored ventilation, respiratory function stable and patient connected to nasal cannula oxygen Cardiovascular status: blood pressure returned to baseline and stable Postop Assessment: no signs of nausea or vomiting Anesthetic complications: no    Last Vitals:  Filed Vitals:   08/07/15 1415 08/07/15 1530  BP: 126/92 150/97  Pulse: 80 74  Temp:  36.9 C  Resp: 18 16    Last Pain:  Filed Vitals:   08/08/15 1517  PainSc: 10-Worst pain ever                 Jamariya Davidoff JENNETTE

## 2015-08-14 DIAGNOSIS — M5137 Other intervertebral disc degeneration, lumbosacral region: Secondary | ICD-10-CM | POA: Diagnosis not present

## 2015-08-14 DIAGNOSIS — Z79899 Other long term (current) drug therapy: Secondary | ICD-10-CM | POA: Diagnosis not present

## 2015-08-14 DIAGNOSIS — M961 Postlaminectomy syndrome, not elsewhere classified: Secondary | ICD-10-CM | POA: Diagnosis not present

## 2015-08-14 DIAGNOSIS — M47817 Spondylosis without myelopathy or radiculopathy, lumbosacral region: Secondary | ICD-10-CM | POA: Diagnosis not present

## 2015-08-14 DIAGNOSIS — G894 Chronic pain syndrome: Secondary | ICD-10-CM | POA: Diagnosis not present

## 2015-08-14 DIAGNOSIS — E669 Obesity, unspecified: Secondary | ICD-10-CM | POA: Diagnosis not present

## 2015-09-11 ENCOUNTER — Other Ambulatory Visit: Payer: Self-pay | Admitting: Family Medicine

## 2015-09-11 DIAGNOSIS — M5137 Other intervertebral disc degeneration, lumbosacral region: Secondary | ICD-10-CM | POA: Diagnosis not present

## 2015-09-11 DIAGNOSIS — M47817 Spondylosis without myelopathy or radiculopathy, lumbosacral region: Secondary | ICD-10-CM | POA: Diagnosis not present

## 2015-09-11 DIAGNOSIS — Z79891 Long term (current) use of opiate analgesic: Secondary | ICD-10-CM | POA: Diagnosis not present

## 2015-09-11 DIAGNOSIS — M961 Postlaminectomy syndrome, not elsewhere classified: Secondary | ICD-10-CM | POA: Diagnosis not present

## 2015-09-11 DIAGNOSIS — G894 Chronic pain syndrome: Secondary | ICD-10-CM | POA: Diagnosis not present

## 2015-09-11 DIAGNOSIS — Z79899 Other long term (current) drug therapy: Secondary | ICD-10-CM | POA: Diagnosis not present

## 2015-09-11 NOTE — Telephone Encounter (Signed)
Pt called because he needs a refill on his Lidocaine since his pain management will no longer fill this. They want the PCP to fill this medication. Also his Humalog is sent in every 17 days and this is more expensive this way. He would like this called in at a 30 day supply and he could get this for $9.00 vs $34.00. jw

## 2015-09-12 MED ORDER — LIDOCAINE 5 % EX OINT: 1.0000 "application " | TOPICAL_OINTMENT | CUTANEOUS | Status: DC | PRN

## 2015-09-12 MED ORDER — INSULIN LISPRO 100 UNIT/ML ~~LOC~~ SOLN: 20.0000 [IU] | Freq: Three times a day (TID) | SUBCUTANEOUS | Status: DC

## 2015-09-12 NOTE — Telephone Encounter (Signed)
Placed Rx for lidocaine and Humalog.  Thanks, Joanna Puffrystal S. Bryce Kimble, MD [facility] Resident

## 2015-09-19 ENCOUNTER — Telehealth: Payer: Self-pay | Admitting: *Deleted

## 2015-09-19 MED ORDER — INSULIN LISPRO 100 UNIT/ML ~~LOC~~ SOLN: 20.0000 [IU] | Freq: Three times a day (TID) | SUBCUTANEOUS | Status: DC

## 2015-09-19 MED ORDER — LIDOCAINE 5 % EX OINT
1.0000 | TOPICAL_OINTMENT | CUTANEOUS | Status: DC | PRN
Start: 2015-09-19 — End: 2015-10-20

## 2015-09-19 NOTE — Telephone Encounter (Signed)
Rx sent in.  Katina Degreealeb M. Jimmey RalphParker, MD [facility] Resident PGY-2 09/19/2015 9:21 AM

## 2015-09-19 NOTE — Telephone Encounter (Signed)
Patient called stating that meds (lidocaine and humalog) were recently sent to [facility] but they do not carry the lidocaine ointment and the humalog is too expensive with this pahrmacy. He would like meds resent to [facility].

## 2015-10-06 ENCOUNTER — Other Ambulatory Visit: Payer: Self-pay | Admitting: Family Medicine

## 2015-10-09 DIAGNOSIS — G894 Chronic pain syndrome: Secondary | ICD-10-CM | POA: Diagnosis not present

## 2015-10-09 DIAGNOSIS — M5137 Other intervertebral disc degeneration, lumbosacral region: Secondary | ICD-10-CM | POA: Diagnosis not present

## 2015-10-09 DIAGNOSIS — Z79899 Other long term (current) drug therapy: Secondary | ICD-10-CM | POA: Diagnosis not present

## 2015-10-09 DIAGNOSIS — M961 Postlaminectomy syndrome, not elsewhere classified: Secondary | ICD-10-CM | POA: Diagnosis not present

## 2015-10-09 DIAGNOSIS — M47817 Spondylosis without myelopathy or radiculopathy, lumbosacral region: Secondary | ICD-10-CM | POA: Diagnosis not present

## 2015-10-09 DIAGNOSIS — Z79891 Long term (current) use of opiate analgesic: Secondary | ICD-10-CM | POA: Diagnosis not present

## 2015-10-20 ENCOUNTER — Encounter: Payer: Self-pay | Admitting: Family Medicine

## 2015-10-20 ENCOUNTER — Ambulatory Visit (INDEPENDENT_AMBULATORY_CARE_PROVIDER_SITE_OTHER): Payer: Medicare Other | Admitting: Family Medicine

## 2015-10-20 DIAGNOSIS — M25512 Pain in left shoulder: Secondary | ICD-10-CM

## 2015-10-20 DIAGNOSIS — M25511 Pain in right shoulder: Secondary | ICD-10-CM | POA: Diagnosis not present

## 2015-10-20 MED ORDER — LIDOCAINE 5 % EX OINT: 1.0000 "application " | TOPICAL_OINTMENT | CUTANEOUS | Status: DC | PRN

## 2015-10-21 NOTE — Assessment & Plan Note (Addendum)
Recurrent/chronic bilateral shoulder pain and ROM limitations associated with arthritis, responds well to steroid injections q6-8 months - repeat injections today - f/u prn

## 2015-10-21 NOTE — Progress Notes (Addendum)
   Subjective:   [name] is a 50 y.o. male with a history of OA, DM, HTN, OSA here for shoulder pain  Reports progressive worsening of bilateral shoulder pain over the past few weeks. This is a chronic issue for him and responds well to corticosteroid injections which he receives every 6-8 months. He has limited abduction R>L d/t pain. Last injection done by me in October with good response.  Review of Systems:  Per HPI. All other systems reviewed and are negative.   PMH, PSH, Medications, Allergies, and FmHx reviewed and updated in EMR.  Social History: current smoker  Objective:  BP 140/90 mmHg  Pulse 79  Temp(Src) 98 F (36.7 C) (Oral)  Wt 283 lb (128.368 kg)  Gen:  50 y.o. male in NAD HEENT: NCAT, MMM, anicteric sclerae MSK: Limited abduction of R shoulder, bilateral pain with abduction, normal strength, no tenderness or visible swelling Neuro: Alert and oriented, speech normal    Assessment & Plan:     [name] is a 50 y.o. male here for shoulder pain and flank pain  Bilateral shoulder pain Recurrent/chronic bilateral shoulder pain and ROM limitations associated with arthritis, responds well to steroid injections q6-8 months - repeat injections today - f/u prn     Beverely LowElena Humaira Sculley, MD, MPH [facility] PGY-3 10/21/2015 11:12 AM    Consent obtained and verified. Sterile betadine prep. Furthur cleansed with alcohol. Topical analgesic spray: Ethyl chloride. Joint: bilateral shoulders Approached in typical fashion with: posterior approach Completed without difficulty Meds: 1:4 depomedrol and lidocaine Needle: 26 gauge Aftercare instructions and Red flags advised.

## 2015-10-27 NOTE — Addendum Note (Signed)
Addended by: Abram SanderADAMO, Karyssa Amaral M on: 10/27/2015 12:14 PM   Modules accepted: Kipp BroodSmartSet

## 2015-11-06 DIAGNOSIS — M961 Postlaminectomy syndrome, not elsewhere classified: Secondary | ICD-10-CM | POA: Diagnosis not present

## 2015-11-06 DIAGNOSIS — M5137 Other intervertebral disc degeneration, lumbosacral region: Secondary | ICD-10-CM | POA: Diagnosis not present

## 2015-11-06 DIAGNOSIS — Z79891 Long term (current) use of opiate analgesic: Secondary | ICD-10-CM | POA: Diagnosis not present

## 2015-11-06 DIAGNOSIS — M47817 Spondylosis without myelopathy or radiculopathy, lumbosacral region: Secondary | ICD-10-CM | POA: Diagnosis not present

## 2015-11-06 DIAGNOSIS — G894 Chronic pain syndrome: Secondary | ICD-10-CM | POA: Diagnosis not present

## 2015-11-06 DIAGNOSIS — Z79899 Other long term (current) drug therapy: Secondary | ICD-10-CM | POA: Diagnosis not present

## 2015-12-04 DIAGNOSIS — Z79899 Other long term (current) drug therapy: Secondary | ICD-10-CM | POA: Diagnosis not present

## 2015-12-04 DIAGNOSIS — G894 Chronic pain syndrome: Secondary | ICD-10-CM | POA: Diagnosis not present

## 2015-12-04 DIAGNOSIS — M47817 Spondylosis without myelopathy or radiculopathy, lumbosacral region: Secondary | ICD-10-CM | POA: Diagnosis not present

## 2015-12-04 DIAGNOSIS — Z79891 Long term (current) use of opiate analgesic: Secondary | ICD-10-CM | POA: Diagnosis not present

## 2015-12-04 DIAGNOSIS — M5137 Other intervertebral disc degeneration, lumbosacral region: Secondary | ICD-10-CM | POA: Diagnosis not present

## 2015-12-04 DIAGNOSIS — M961 Postlaminectomy syndrome, not elsewhere classified: Secondary | ICD-10-CM | POA: Diagnosis not present

## 2016-01-07 ENCOUNTER — Other Ambulatory Visit: Payer: Self-pay | Admitting: *Deleted

## 2016-01-07 MED ORDER — HYDROCHLOROTHIAZIDE 25 MG PO TABS: ORAL_TABLET | ORAL | 2 refills | Status: DC

## 2016-01-09 DIAGNOSIS — M961 Postlaminectomy syndrome, not elsewhere classified: Secondary | ICD-10-CM | POA: Diagnosis not present

## 2016-01-09 DIAGNOSIS — M47817 Spondylosis without myelopathy or radiculopathy, lumbosacral region: Secondary | ICD-10-CM | POA: Diagnosis not present

## 2016-01-09 DIAGNOSIS — G894 Chronic pain syndrome: Secondary | ICD-10-CM | POA: Diagnosis not present

## 2016-01-09 DIAGNOSIS — Z79899 Other long term (current) drug therapy: Secondary | ICD-10-CM | POA: Diagnosis not present

## 2016-01-09 DIAGNOSIS — M545 Low back pain: Secondary | ICD-10-CM | POA: Diagnosis not present

## 2016-01-09 DIAGNOSIS — M5137 Other intervertebral disc degeneration, lumbosacral region: Secondary | ICD-10-CM | POA: Diagnosis not present

## 2016-01-09 DIAGNOSIS — Z79891 Long term (current) use of opiate analgesic: Secondary | ICD-10-CM | POA: Diagnosis not present

## 2016-01-19 ENCOUNTER — Telehealth: Payer: Self-pay | Admitting: Family Medicine

## 2016-01-19 NOTE — Telephone Encounter (Signed)
Pt is calling because he would like his CPAP supplies sent to [facility]. Please send them orders so that he can get these delivered. jw

## 2016-01-26 NOTE — Telephone Encounter (Signed)
I have attempted to contact the patient several times. From EMR review, it looks like he never had titration of his CPAP after his initial sleep study and hospitalization in 2013. I need this so I can send in the right parameters.  Thanks, Joanna Puffrystal S. Rhonda Linan, MD [facility] Resident

## 2016-02-09 DIAGNOSIS — M5137 Other intervertebral disc degeneration, lumbosacral region: Secondary | ICD-10-CM | POA: Diagnosis not present

## 2016-02-09 DIAGNOSIS — Z79899 Other long term (current) drug therapy: Secondary | ICD-10-CM | POA: Diagnosis not present

## 2016-02-09 DIAGNOSIS — Z79891 Long term (current) use of opiate analgesic: Secondary | ICD-10-CM | POA: Diagnosis not present

## 2016-02-09 DIAGNOSIS — M47817 Spondylosis without myelopathy or radiculopathy, lumbosacral region: Secondary | ICD-10-CM | POA: Diagnosis not present

## 2016-02-09 DIAGNOSIS — G894 Chronic pain syndrome: Secondary | ICD-10-CM | POA: Diagnosis not present

## 2016-02-09 DIAGNOSIS — M961 Postlaminectomy syndrome, not elsewhere classified: Secondary | ICD-10-CM | POA: Diagnosis not present

## 2016-02-11 ENCOUNTER — Telehealth: Payer: Self-pay | Admitting: Family Medicine

## 2016-02-11 DIAGNOSIS — G4730 Sleep apnea, unspecified: Secondary | ICD-10-CM

## 2016-02-11 NOTE — Telephone Encounter (Signed)
Called the patient after getting reports from Dr. Roxy CedarYoung's office. Dr. Roxy CedarYoung's office only has a diagnostic report, they do not have a titration report (and there's no report in our system). They will not provide DME equipment without a titration report. Patient needs face mask, new tubing, etc. He is very frustrated as he notes he's called the sleep study place, [facility], and our office at least 12 times (I only have 1 documented message from the patient and was unable to get in touch with him after several attempts at that time).    I have placed and order for the CPAP titration, is there any way we can get him in sooner rather than later?  Thank you, Joanna Puffrystal S. Dorsey, MD [facility] Resident  02/11/2016, 11:04 AM

## 2016-02-12 NOTE — Telephone Encounter (Signed)
Spoke with [name], patient will be contacted directly by the sleep disorders center.

## 2016-02-20 ENCOUNTER — Telehealth: Payer: Self-pay | Admitting: Family Medicine

## 2016-02-20 DIAGNOSIS — G4730 Sleep apnea, unspecified: Secondary | ICD-10-CM

## 2016-02-20 NOTE — Telephone Encounter (Signed)
Terri with the sleep center needs the order changed to a split night study.

## 2016-02-23 NOTE — Telephone Encounter (Signed)
Ordered sleep study.  Thanks, Joanna Puffrystal S. Rockie Schnoor, MD [facility] Res[facility]  02/23/2016, 10:20 AM

## 2016-03-08 DIAGNOSIS — M5137 Other intervertebral disc degeneration, lumbosacral region: Secondary | ICD-10-CM | POA: Diagnosis not present

## 2016-03-08 DIAGNOSIS — Z79899 Other long term (current) drug therapy: Secondary | ICD-10-CM | POA: Diagnosis not present

## 2016-03-08 DIAGNOSIS — G894 Chronic pain syndrome: Secondary | ICD-10-CM | POA: Diagnosis not present

## 2016-03-08 DIAGNOSIS — M47817 Spondylosis without myelopathy or radiculopathy, lumbosacral region: Secondary | ICD-10-CM | POA: Diagnosis not present

## 2016-03-08 DIAGNOSIS — Z79891 Long term (current) use of opiate analgesic: Secondary | ICD-10-CM | POA: Diagnosis not present

## 2016-03-08 DIAGNOSIS — M79606 Pain in leg, unspecified: Secondary | ICD-10-CM | POA: Diagnosis not present

## 2016-03-08 DIAGNOSIS — M961 Postlaminectomy syndrome, not elsewhere classified: Secondary | ICD-10-CM | POA: Diagnosis not present

## 2016-03-15 ENCOUNTER — Ambulatory Visit (HOSPITAL_BASED_OUTPATIENT_CLINIC_OR_DEPARTMENT_OTHER): Payer: Medicare Other | Attending: Family Medicine | Admitting: Internal Medicine

## 2016-03-15 DIAGNOSIS — I493 Ventricular premature depolarization: Secondary | ICD-10-CM | POA: Insufficient documentation

## 2016-03-15 DIAGNOSIS — R5383 Other fatigue: Secondary | ICD-10-CM | POA: Insufficient documentation

## 2016-03-15 DIAGNOSIS — Z6841 Body Mass Index (BMI) 40.0 and over, adult: Secondary | ICD-10-CM | POA: Insufficient documentation

## 2016-03-15 DIAGNOSIS — R0683 Snoring: Secondary | ICD-10-CM | POA: Insufficient documentation

## 2016-03-15 DIAGNOSIS — G4733 Obstructive sleep apnea (adult) (pediatric): Secondary | ICD-10-CM | POA: Diagnosis not present

## 2016-03-15 DIAGNOSIS — E669 Obesity, unspecified: Secondary | ICD-10-CM | POA: Diagnosis not present

## 2016-03-15 DIAGNOSIS — G4730 Sleep apnea, unspecified: Secondary | ICD-10-CM | POA: Diagnosis present

## 2016-03-15 DIAGNOSIS — E119 Type 2 diabetes mellitus without complications: Secondary | ICD-10-CM | POA: Insufficient documentation

## 2016-03-15 DIAGNOSIS — G4719 Other hypersomnia: Secondary | ICD-10-CM | POA: Diagnosis not present

## 2016-03-21 DIAGNOSIS — G4733 Obstructive sleep apnea (adult) (pediatric): Secondary | ICD-10-CM | POA: Diagnosis not present

## 2016-03-21 NOTE — Procedures (Signed)
Patient Name: [name], [name] Date: 03/15/2016 Gender: Male D.O.B: [birth year] Age (years): 49 Referring Provider: Blane Ohara McDiarmid Height (inches): 68 Interpreting Physician: Baird Lyons MD, ABSM Weight (lbs): 270 RPSGT: Carolin Coy BMI: 41 MRN: [id number] Neck Size: 20.50 CLINICAL INFORMATION Sleep Study Type: Split Night CPAP Indication for sleep study: Diabetes, Excessive Daytime Sleepiness, Fatigue, Obesity, Snoring Epworth Sleepiness Score: 21  SLEEP STUDY TECHNIQUE As per the AASM Manual for the Scoring of Sleep and Associated Events v2.3 (April 2016) with a hypopnea requiring 4% desaturations. The channels recorded and monitored were frontal, central and occipital EEG, electrooculogram (EOG), submentalis EMG (chin), nasal and oral airflow, thoracic and abdominal wall motion, anterior tibialis EMG, snore microphone, electrocardiogram, and pulse oximetry. Continuous positive airway pressure (CPAP) was initiated when the patient met split night criteria and was titrated according to treat sleep-disordered breathing.  MEDICATIONS Medications taken by the patient : charted for review Medications administered by patient during sleep study : otc nighttime sleep aid  RESPIRATORY PARAMETERS Diagnostic Total AHI (/hr): 114.3 RDI (/hr): 114.7 OA Index (/hr): 29.2 CA Index (/hr): 0.4 REM AHI (/hr): N/A NREM AHI (/hr): 114.3 Supine AHI (/hr): 126.3 Non-supine AHI (/hr): 109.55 Min O2 Sat (%): 74.00 Mean O2 (%): 89.44 Time below 88% (min): 62.7   Titration Optimal Pressure (cm): 19 AHI at Optimal Pressure (/hr): 7.4 Min O2 at Optimal Pressure (%): 93.0 Supine % at Optimal (%): 0 Sleep % at Optimal (%): 100    SLEEP ARCHITECTURE The recording time for the entire night was 453.7 minutes. During a baseline period of 188.0 minutes, the patient slept for 148.0 minutes in REM and nonREM, yielding a sleep efficiency of 78.7%. Sleep onset after lights out was 3.9 minutes with a  REM latency of N/A minutes. The patient spent 30.74% of the night in stage N1 sleep, 69.26% in stage N2 sleep, 0.00% in stage N3 and 0.00% in REM. During the titration period of 261.1 minutes, the patient slept for 255.3 minutes in REM and nonREM, yielding a sleep efficiency of 97.8%. Sleep onset after CPAP initiation was 5.3 minutes with a REM latency of 79.0 minutes. The patient spent 3.53% of the night in stage N1 sleep, 56.41% in stage N2 sleep, 0.00% in stage N3 and 40.07% in REM.  CARDIAC DATA The 2 lead EKG demonstrated sinus rhythm. The mean heart rate was 89.45 beats per minute. Other EKG findings include: PVCs.  LEG MOVEMENT DATA The total Periodic Limb Movements of Sleep (PLMS) were 0. The PLMS index was 0.00 .  IMPRESSIONS - Severe obstructive sleep apnea occurred during the diagnostic portion of the study (AHI = 114.3/hour). An optimal PAP pressure was selected for this patient ( 19 cm of water) - No significant central sleep apnea occurred during the diagnostic portion of the study (CAI = 0.4/hour). - Severe oxygen desaturation was noted during the diagnostic portion of the study (Min O2 = 74.00%). - The patient snored with Moderate snoring volume during the diagnostic portion of the study. - EKG findings include PVCs. - Clinically significant periodic limb movements did not occur during sleep.  DIAGNOSIS - Obstructive Sleep Apnea (327.23 [G47.33 ICD-10])  RECOMMENDATIONS - Trial of CPAP therapy on 19 cm H2O with a Small size Fisher&Paykel Full Face Mask Simplus mask and heated humidification. - Avoid alcohol, sedatives and other CNS depressants that may worsen sleep apnea and disrupt normal sleep architecture. - Sleep hygiene should be reviewed to assess factors that may improve sleep quality. - Weight management  and regular exercise should be initiated or continued.  [Electronically signed] 03/21/2016 08:28 PM  Baird Lyons MD, [place], American Board of Sleep  Medicine   NPI: [id number]  [place], [place] of Sleep Medicine  ELECTRONICALLY SIGNED ON:  03/21/2016, 8:26 PM [place] PH: [phone]   FX: [phone] [place]

## 2016-03-24 ENCOUNTER — Telehealth: Payer: Self-pay | Admitting: Family Medicine

## 2016-03-24 NOTE — Telephone Encounter (Signed)
Attempted to call patient as I just received his sleep study titration. Unsure if whether the reading MD sent the appropriate information to [facility] for his supplies.  No answer.  Faxed sleep study to [facility] for insurance purposes as previously requested from patient to [phone].  Joanna Puffrystal S. Delisia Mcquiston, MD [facility] Resident  03/24/2016, 11:38 AM

## 2016-04-05 DIAGNOSIS — M961 Postlaminectomy syndrome, not elsewhere classified: Secondary | ICD-10-CM | POA: Diagnosis not present

## 2016-04-05 DIAGNOSIS — Z79899 Other long term (current) drug therapy: Secondary | ICD-10-CM | POA: Diagnosis not present

## 2016-04-05 DIAGNOSIS — M47817 Spondylosis without myelopathy or radiculopathy, lumbosacral region: Secondary | ICD-10-CM | POA: Diagnosis not present

## 2016-04-05 DIAGNOSIS — Z79891 Long term (current) use of opiate analgesic: Secondary | ICD-10-CM | POA: Diagnosis not present

## 2016-04-05 DIAGNOSIS — G894 Chronic pain syndrome: Secondary | ICD-10-CM | POA: Diagnosis not present

## 2016-04-05 DIAGNOSIS — M545 Low back pain: Secondary | ICD-10-CM | POA: Diagnosis not present

## 2016-04-05 DIAGNOSIS — M5137 Other intervertebral disc degeneration, lumbosacral region: Secondary | ICD-10-CM | POA: Diagnosis not present

## 2016-04-12 ENCOUNTER — Other Ambulatory Visit: Payer: Self-pay | Admitting: Family Medicine

## 2016-04-26 ENCOUNTER — Telehealth: Payer: Self-pay | Admitting: Family Medicine

## 2016-04-26 NOTE — Telephone Encounter (Signed)
I had this paperwork faxed to [facility] on 10/11 (after I was unable to get in touch with the patient).   When I called [facility] today, she said she would see if they had a file on him, unfortunately that person was out of the office today.   She will check with the person in charge tomorrow (11/14) and have them contact our office.   Please let the patient know that we are working on this.  Thanks, Joanna Puffrystal S. Dorsey, MD [facility] Resident  04/26/2016, 4:59 PM

## 2016-04-26 NOTE — Telephone Encounter (Signed)
Pt called because [facility] has never received the patients orders for CPAP supplies. He is very upset and tired of waiting for this to happen. We faxed this on 03/24/16.     ````````````````````

## 2016-04-29 NOTE — Telephone Encounter (Signed)
Spoke with Misty StanleyLisa at [facility], she said they touched base with the patient yesterday. They have everything they need (face to face, etc). She said she would be faxing over one last thing (a billable order) today, but this would not delay him getting supplies.  Joanna Puffrystal S. Dorsey, MD [facility] Resident  04/29/2016, 12:08 PM

## 2016-05-04 DIAGNOSIS — G4733 Obstructive sleep apnea (adult) (pediatric): Secondary | ICD-10-CM | POA: Diagnosis not present

## 2016-05-10 ENCOUNTER — Other Ambulatory Visit: Payer: Self-pay | Admitting: *Deleted

## 2016-05-10 DIAGNOSIS — M47817 Spondylosis without myelopathy or radiculopathy, lumbosacral region: Secondary | ICD-10-CM | POA: Diagnosis not present

## 2016-05-10 DIAGNOSIS — M961 Postlaminectomy syndrome, not elsewhere classified: Secondary | ICD-10-CM | POA: Diagnosis not present

## 2016-05-10 DIAGNOSIS — Z79891 Long term (current) use of opiate analgesic: Secondary | ICD-10-CM | POA: Diagnosis not present

## 2016-05-10 DIAGNOSIS — M5137 Other intervertebral disc degeneration, lumbosacral region: Secondary | ICD-10-CM | POA: Diagnosis not present

## 2016-05-10 DIAGNOSIS — Z79899 Other long term (current) drug therapy: Secondary | ICD-10-CM | POA: Diagnosis not present

## 2016-05-10 DIAGNOSIS — G894 Chronic pain syndrome: Secondary | ICD-10-CM | POA: Diagnosis not present

## 2016-05-10 NOTE — Telephone Encounter (Signed)
Refill request for 90 day supply.  Farin Buhman L, RN  

## 2016-05-11 MED ORDER — HYDROCHLOROTHIAZIDE 25 MG PO TABS: ORAL_TABLET | ORAL | 2 refills | Status: DC

## 2016-05-13 ENCOUNTER — Other Ambulatory Visit: Payer: Self-pay | Admitting: Family Medicine

## 2016-06-03 DIAGNOSIS — G4733 Obstructive sleep apnea (adult) (pediatric): Secondary | ICD-10-CM | POA: Diagnosis not present

## 2016-06-16 DIAGNOSIS — G894 Chronic pain syndrome: Secondary | ICD-10-CM | POA: Diagnosis not present

## 2016-06-16 DIAGNOSIS — M961 Postlaminectomy syndrome, not elsewhere classified: Secondary | ICD-10-CM | POA: Diagnosis not present

## 2016-06-16 DIAGNOSIS — Z79891 Long term (current) use of opiate analgesic: Secondary | ICD-10-CM | POA: Diagnosis not present

## 2016-06-16 DIAGNOSIS — M5137 Other intervertebral disc degeneration, lumbosacral region: Secondary | ICD-10-CM | POA: Diagnosis not present

## 2016-06-16 DIAGNOSIS — M47817 Spondylosis without myelopathy or radiculopathy, lumbosacral region: Secondary | ICD-10-CM | POA: Diagnosis not present

## 2016-06-16 DIAGNOSIS — Z79899 Other long term (current) drug therapy: Secondary | ICD-10-CM | POA: Diagnosis not present

## 2016-07-04 DIAGNOSIS — G4733 Obstructive sleep apnea (adult) (pediatric): Secondary | ICD-10-CM | POA: Diagnosis not present

## 2016-07-12 DIAGNOSIS — M47817 Spondylosis without myelopathy or radiculopathy, lumbosacral region: Secondary | ICD-10-CM | POA: Diagnosis not present

## 2016-07-12 DIAGNOSIS — Z79891 Long term (current) use of opiate analgesic: Secondary | ICD-10-CM | POA: Diagnosis not present

## 2016-07-12 DIAGNOSIS — M5137 Other intervertebral disc degeneration, lumbosacral region: Secondary | ICD-10-CM | POA: Diagnosis not present

## 2016-07-12 DIAGNOSIS — G894 Chronic pain syndrome: Secondary | ICD-10-CM | POA: Diagnosis not present

## 2016-07-12 DIAGNOSIS — M961 Postlaminectomy syndrome, not elsewhere classified: Secondary | ICD-10-CM | POA: Diagnosis not present

## 2016-07-12 DIAGNOSIS — Z79899 Other long term (current) drug therapy: Secondary | ICD-10-CM | POA: Diagnosis not present

## 2016-07-19 ENCOUNTER — Telehealth: Payer: Self-pay

## 2016-07-19 ENCOUNTER — Ambulatory Visit (INDEPENDENT_AMBULATORY_CARE_PROVIDER_SITE_OTHER): Payer: Medicare Other | Admitting: Family Medicine

## 2016-07-19 ENCOUNTER — Encounter: Payer: Self-pay | Admitting: Family Medicine

## 2016-07-19 VITALS — BP 132/86 | HR 84 | Temp 97.8°F | Ht 68.0 in | Wt 291.0 lb

## 2016-07-19 DIAGNOSIS — L219 Seborrheic dermatitis, unspecified: Secondary | ICD-10-CM

## 2016-07-19 DIAGNOSIS — E1142 Type 2 diabetes mellitus with diabetic polyneuropathy: Secondary | ICD-10-CM | POA: Diagnosis not present

## 2016-07-19 LAB — POCT GLYCOSYLATED HEMOGLOBIN (HGB A1C): Hemoglobin A1C: 7.2

## 2016-07-19 MED ORDER — KETOCONAZOLE 2 % EX SHAM: MEDICATED_SHAMPOO | Freq: Every day | CUTANEOUS | 6 refills | Status: DC

## 2016-07-19 MED ORDER — CLINDAMYCIN HCL 300 MG PO CAPS: 300.0000 mg | ORAL_CAPSULE | Freq: Three times a day (TID) | ORAL | 0 refills | Status: DC

## 2016-07-19 MED ORDER — TRIAMCINOLONE ACETONIDE 0.1 % EX CREA: TOPICAL_CREAM | Freq: Two times a day (BID) | CUTANEOUS | 2 refills | Status: DC | PRN

## 2016-07-19 NOTE — Telephone Encounter (Signed)
Pharmacy is saying they did not get Rx Shampoo for pt. Please resend. Sunday SpillersSharon T Jacques Willingham, CMA

## 2016-07-19 NOTE — Progress Notes (Signed)
Subjective:     Patient ID: [name], male   DOB: 05/10/1966, 51 y.o.   MRN: [id number]  HPI Mr. Sheral FlowBentley is a 51yo male presenting today for rash of the face and scalp. Reports she first noticed the rash starting 2 weeks ago in his scalp. It has now extended down to include his beard. Notes dry skin that is itchy. Notes 2-3 years ago he was diagnosed with severe seborrheic dermatitis. The caused him to shave his head and his beard. He had to get rid of all of his pillows and blankets. The required treatment with ketoconazole shampoo and triamcinolone cream. Also notes it was so severe it required courses of Clindamycin and Bactrim and requests antibiotic today--worried if he does not get an antibiotic it will once again become severe and he wishes to head it off before he has to shave again.  Review of Systems Per HPI    Objective:   Physical Exam  Constitutional: He appears well-developed and well-nourished. No distress.  Cardiovascular: Normal rate and regular rhythm.   No murmur heard. Pulmonary/Chest: Effort normal. No respiratory distress. He has no wheezes.  Abdominal: Soft. He exhibits no distension. There is no tenderness.  Skin:  Scaly dry rash noted along beard and scalp, consistent with Seborrheic dermatitis  Psychiatric: He has a normal mood and affect. His behavior is normal.      Assessment and Plan:     1. Seborrheic dermatitis of scalp Exam consistent with seborrheic dermatitis given appearance and location. Triamcinolone cream and Ketoconazole shampoo refilled. Initial prescription for Ketoconazole did not go through to pharmacy, so shampoo refilled a second time. Prescription for Clindamycin given, although instructed not to fill unless above regimen does not work for 2 weeks; discussed that antibiotics are often not necessary for this condition however he reports prior flare required several doses of antibiotics. Return if no improvement.  2. Type 2 diabetes  mellitus with diabetic polyneuropathy, unspecified long term insulin use status (HCC) A1C increased to 6.2. Instructed to return to discuss his diabetes soon with PCP.

## 2016-07-19 NOTE — Patient Instructions (Signed)
Thank you so much for coming to visit today! I have placed a refill for your Ketoconazole shampoo and Triamcinolone ointment. I have also sent a prescription for Clindamycin to the pharmacy. This is not usually needed for Seborrheic dermatitis, so I encourage you to try the creams and shampoos for another 1-2 weeks and then if no improvement you may fill the antibiotic. Please return for your Diabetes visit soon! Your A1C increased to 7.2.  Thanks! Dr. Caroleen Hammanumley

## 2016-08-04 DIAGNOSIS — G4733 Obstructive sleep apnea (adult) (pediatric): Secondary | ICD-10-CM | POA: Diagnosis not present

## 2016-08-09 DIAGNOSIS — Z79891 Long term (current) use of opiate analgesic: Secondary | ICD-10-CM | POA: Diagnosis not present

## 2016-08-09 DIAGNOSIS — M5137 Other intervertebral disc degeneration, lumbosacral region: Secondary | ICD-10-CM | POA: Diagnosis not present

## 2016-08-09 DIAGNOSIS — G894 Chronic pain syndrome: Secondary | ICD-10-CM | POA: Diagnosis not present

## 2016-08-09 DIAGNOSIS — M47817 Spondylosis without myelopathy or radiculopathy, lumbosacral region: Secondary | ICD-10-CM | POA: Diagnosis not present

## 2016-08-09 DIAGNOSIS — Z79899 Other long term (current) drug therapy: Secondary | ICD-10-CM | POA: Diagnosis not present

## 2016-08-09 DIAGNOSIS — M961 Postlaminectomy syndrome, not elsewhere classified: Secondary | ICD-10-CM | POA: Diagnosis not present

## 2016-08-31 DIAGNOSIS — G4733 Obstructive sleep apnea (adult) (pediatric): Secondary | ICD-10-CM | POA: Diagnosis not present

## 2016-09-01 DIAGNOSIS — G4733 Obstructive sleep apnea (adult) (pediatric): Secondary | ICD-10-CM | POA: Diagnosis not present

## 2016-09-06 DIAGNOSIS — Z79891 Long term (current) use of opiate analgesic: Secondary | ICD-10-CM | POA: Diagnosis not present

## 2016-09-06 DIAGNOSIS — M47817 Spondylosis without myelopathy or radiculopathy, lumbosacral region: Secondary | ICD-10-CM | POA: Diagnosis not present

## 2016-09-06 DIAGNOSIS — Z79899 Other long term (current) drug therapy: Secondary | ICD-10-CM | POA: Diagnosis not present

## 2016-09-06 DIAGNOSIS — M5137 Other intervertebral disc degeneration, lumbosacral region: Secondary | ICD-10-CM | POA: Diagnosis not present

## 2016-09-06 DIAGNOSIS — M961 Postlaminectomy syndrome, not elsewhere classified: Secondary | ICD-10-CM | POA: Diagnosis not present

## 2016-09-06 DIAGNOSIS — G894 Chronic pain syndrome: Secondary | ICD-10-CM | POA: Diagnosis not present

## 2016-09-20 ENCOUNTER — Ambulatory Visit (INDEPENDENT_AMBULATORY_CARE_PROVIDER_SITE_OTHER): Payer: Medicare Other | Admitting: Family Medicine

## 2016-09-20 ENCOUNTER — Encounter: Payer: Self-pay | Admitting: Family Medicine

## 2016-09-20 ENCOUNTER — Ambulatory Visit (HOSPITAL_COMMUNITY)
Admission: RE | Admit: 2016-09-20 | Discharge: 2016-09-20 | Disposition: A | Discharge status: Home / Self Care | Payer: Medicare Other | Source: Ambulatory Visit | Attending: Family Medicine | Admitting: Family Medicine

## 2016-09-20 VITALS — BP 120/84 | HR 94 | Temp 98.3°F | Wt 280.0 lb

## 2016-09-20 DIAGNOSIS — R079 Chest pain, unspecified: Secondary | ICD-10-CM | POA: Diagnosis not present

## 2016-09-20 NOTE — Patient Instructions (Signed)
I have referred you to the cardiologist.  If your chest pain worsens or changes in characteristic at all, please seek care immediately.  Look out for a rash, although it would be very unusual to have symptoms for 6-8 weeks of shingles without a rash.   Nonspecific Chest Pain Chest pain can be caused by many different conditions. There is always a chance that your pain could be related to something serious, such as a heart attack or a blood clot in your lungs. Chest pain can also be caused by conditions that are not life-threatening. If you have chest pain, it is very important to follow up with your health care provider. What are the causes? Causes of this condition include:  Heartburn.  Pneumonia or bronchitis.  Anxiety or stress.  Inflammation around your heart (pericarditis) or lung (pleuritis or pleurisy).  A blood clot in your lung.  A collapsed lung (pneumothorax). This can develop suddenly on its own (spontaneous pneumothorax) or from trauma to the chest.  Shingles infection (varicella-zoster virus).  Heart attack.  Damage to the bones, muscles, and cartilage that make up your chest wall. This can include:  Bruised bones due to injury.  Strained muscles or cartilage due to frequent or repeated coughing or overwork.  Fracture to one or more ribs.  Sore cartilage due to inflammation (costochondritis). What increases the risk? Risk factors for this condition may include:  Activities that increase your risk for trauma or injury to your chest.  Respiratory infections or conditions that cause frequent coughing.  Medical conditions or overeating that can cause heartburn.  Heart disease or family history of heart disease.  Conditions or health behaviors that increase your risk of developing a blood clot.  Having had chicken pox (varicella zoster). What are the signs or symptoms? Chest pain can feel like:  Burning or tingling on the surface of your chest or deep in  your chest.  Crushing, pressure, aching, or squeezing pain.  Dull or sharp pain that is worse when you move, cough, or take a deep breath.  Pain that is also felt in your back, neck, shoulder, or arm, or pain that spreads to any of these areas. Your chest pain may come and go, or it may stay constant. How is this diagnosed? Lab tests or other studies may be needed to find the cause of your pain. Your health care provider may have you take a test called an ECG (electrocardiogram). An ECG records your heartbeat patterns at the time the test is performed. You may also have other tests, such as:  Transthoracic echocardiogram (TTE). In this test, sound waves are used to create a picture of the heart structures and to look at how blood flows through your heart.  Transesophageal echocardiogram (TEE).This is a more advanced imaging test that takes images from inside your body. It allows your health care provider to see your heart in finer detail.  Cardiac monitoring. This allows your health care provider to monitor your heart rate and rhythm in real time.  Holter monitor. This is a portable device that records your heartbeat and can help to diagnose abnormal heartbeats. It allows your health care provider to track your heart activity for several days, if needed.  Stress tests. These can be done through exercise or by taking medicine that makes your heart beat more quickly.  Blood tests.  Other imaging tests. How is this treated? Treatment depends on what is causing your chest pain. Treatment may include:  Medicines. These  may include:  Acid blockers for heartburn.  Anti-inflammatory medicine.  Pain medicine for inflammatory conditions.  Antibiotic medicine, if an infection is present.  Medicines to dissolve blood clots.  Medicines to treat coronary artery disease (CAD).  Supportive care for conditions that do not require medicines. This may include:  Resting.  Applying heat or  cold packs to injured areas.  Limiting activities until pain decreases. Follow these instructions at home: Medicines   If you were prescribed an antibiotic, take it as told by your health care provider. Do not stop taking the antibiotic even if you start to feel better.  Take over-the-counter and prescription medicines only as told by your health care provider. Lifestyle   Do not use any products that contain nicotine or tobacco, such as cigarettes and e-cigarettes. If you need help quitting, ask your health care provider.  Do not drink alcohol.  Make lifestyle changes as directed by your health care provider. These may include:  Getting regular exercise. Ask your health care provider to suggest some activities that are safe for you.  Eating a heart-healthy diet. A registered dietitian can help you to learn healthy eating options.  Maintaining a healthy weight.  Managing diabetes, if necessary.  Reducing stress, such as with yoga or relaxation techniques. General instructions   Avoid any activities that bring on chest pain.  If heartburn is the cause for your chest pain, raise (elevate) the head of your bed about 6 inches (15 cm) by putting blocks under the legs. Sleeping with more pillows does not effectively relieve heartburn because it only changes the position of your head.  Keep all follow-up visits as told by your health care provider. This is important. This includes any further testing if your chest pain does not go away. Contact a health care provider if:  Your chest pain does not go away.  You have a rash with blisters on your chest.  You have a fever.  You have chills. Get help right away if:  Your chest pain is worse.  You have a cough that gets worse, or you cough up blood.  You have severe pain in your abdomen.  You have severe weakness.  You faint.  You have sudden, unexplained chest discomfort.  You have sudden, unexplained discomfort in your  arms, back, neck, or jaw.  You have shortness of breath at any time.  You suddenly start to sweat, or your skin gets clammy.  You feel nauseous or you vomit.  You suddenly feel light-headed or dizzy.  Your heart begins to beat quickly, or it feels like it is skipping beats. These symptoms may represent a serious problem that is an emergency. Do not wait to see if the symptoms will go away. Get medical help right away. Call your local emergency services (911 in the [place] Do not drive yourself to the hospital. This information is not intended to replace advice given to you by your health care provider. Make sure you discuss any questions you have with your health care provider. Document Released: 03/10/2005 Document Revised: 02/23/2016 Document Reviewed: 02/23/2016 Elsevier Interactive Patient Education  2017 [employer].

## 2016-09-20 NOTE — Progress Notes (Signed)
Subjective: CC: chest pain HPI: Patient is a 51 y.o. male with a past medical history of HTN, T2DM, ED, hypertriglyceridemia  presenting to clinic today for chest pain intermittently over the last month.  Patient notes intermittent chest pain for the last 6-8 weeks. It is left sided and substernal. Pain is sometimes dull ache, sometimes, sharp, other times burning sensation. It sometimes goes into the axilla. Pain comes on when sitting. Has never noticed it while ambulation. Often occurs at night while watching TV in bed. It lasts any where from a few minutes to hours. No SOB, no diaphoresis, jaw claudication, reflux, rash. No chest pain currently.  BP checked during this time is not extremely high 150/90. His mom, maternal grandmother, maternal aunts, and cousins with a h/o MI.   He stopped taking gabapentin a few months ago due to GI upset. He takes Norco for chronic back pain but it hasn't helped with chest pain. Nothing has made his symptoms better or worse   Social History: former smoker (quit 5 years ago), no drug use   ROS: All other systems reviewed and are negative.  Past Medical History Patient Active Problem List   Diagnosis Date Noted  . Chest pain 09/20/2016  . Pilonidal disease 06/03/2015  . Flank pain, acute 03/25/2015  . Weight loss counseling, encounter for 12/20/2013  . Other malaise and fatigue 09/03/2013  . Difficulty concentrating 09/03/2013  . Tobacco dependence in remission 04/10/2013  . Seborrheic dermatitis of scalp 01/23/2013  . Onychomycosis 01/23/2013  . Bilateral shoulder pain 01/23/2013  . Skin tag 04/25/2012  . 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 OSA (obstructive sleep apnea) 01/31/2012  . Heel callus 12/11/2011  . ED (erectile dysfunction) of organic origin 12/11/2011  . Venous stasis 11/02/2011  . Recurrent boils 08/13/2009  . DISTURBANCE OF SKIN SENSATION 06/17/2009  . Pain in joint 04/04/2008  .  TRANSAMINASES, SERUM, ELEVATED 04/02/2008  . HYPERTRIGLYCERIDEMIA, SEVERE 04/27/2007  . LEG EDEMA, BILATERAL 03/10/2007  . TACHYCARDIA 01/30/2007  . OBESITY, NOS 08/11/2006  . HYPERTENSION, BENIGN SYSTEMIC 08/11/2006  . BACK PAIN W/RADIATION, UNSPECIFIED 08/11/2006    Medications- reviewed and updated Current Outpatient Prescriptions  Medication Sig Dispense Refill  . aspirin 81 MG tablet Take 81 mg by mouth daily.    . clindamycin (CLEOCIN) 300 MG capsule Take 1 capsule (300 mg total) by mouth 3 (three) times daily. 21 capsule 0  . diclofenac sodium (VOLTAREN) 1 % GEL Apply 4 g topically 4 (four) times daily as needed.     . hydrochlorothiazide (HYDRODIURIL) 25 MG tablet TAKE 1/2 TABLET OR 1 TABLET BY MOUTH FOR FLUID RETENTION 30 tablet 2  . HYDROcodone-acetaminophen (NORCO) 10-325 MG tablet Take 1 tablet by mouth every 4 (four) hours as needed. 50 tablet 0  . insulin lispro (HUMALOG) 100 UNIT/ML injection Inject 0.2 mLs (20 Units total) into the skin 3 (three) times daily before meals. 20 mL 11  . ketoconazole (NIZORAL) 2 % shampoo Apply topically daily. 120 mL 6  . LANTUS 100 UNIT/ML injection INJECT 0.36 MLS (36 UNITS TOTAL) INTO THE SKIN DAILY. 20 mL 5  . lidocaine (XYLOCAINE) 5 % ointment Apply 1 application topically as needed. 2500 g 5  . Multiple Vitamin (MULTIVITAMIN WITH MINERALS) TABS tablet Take 1 tablet by mouth daily.    Marland Kitchen triamcinolone cream (KENALOG) 0.1 % Apply topically 2 (two) times daily as needed. 80 g 2   No current facility-administered medications for this visit.  Objective: Office vital signs reviewed. BP 120/84   Pulse 94   Temp 98.3 F (36.8 C) (Oral)   Wt 280 lb (127 kg)   SpO2 97%   BMI 42.57 kg/m    Physical Examination:  General: Awake, alert, well- nourished, NAD Cardio: RRR, no m/r/g noted. No thrill. No reproducible pain with palpation over anterior chest wall.  Pulm: No increased WOB.  CTAB, without wheezes, rhonchi or crackles noted.    Extremities: no edema. Skin: dry, intact, no rashes or lesions over the anterior chest wall.   EKG: NSR HR 92. No ST elevation or depression. No significant change since previous EKG 07/2015  Assessment/Plan: Chest pain Atypical chest pain, not associated with exertion. Initially suspected herpes zoster, however no rash noted on exam. He quite gabapentin several months ago and he notes symptoms intermittently feel "burning" in nature, however this would be a very odd place to have neuropathic pain.  Pt has significant risk factors such as HTN and DM. No chest pain currently and EKG re-assuring. He's on an aspirin.  - referral to cardiology, I believe he'd benefit from a stress test. - will get BMET, CBC, and magnesium level. - strict reasons to seek care immediately.    Orders Placed This Encounter  Procedures  . Basic metabolic panel  . CBC with Differential/Platelet  . Magnesium  . Ambulatory referral to Cardiology    Referral Priority:   Routine    Referral Type:   Consultation    Referral Reason:   Specialty Services Required    Requested Specialty:   Cardiology    Number of Visits Requested:   1  . EKG 12-Lead    No orders of the defined types were placed in this encounter.   Joanna Puff PGY-3, [facility]

## 2016-09-20 NOTE — Assessment & Plan Note (Signed)
Atypical chest pain, not associated with exertion. Initially suspected herpes zoster, however no rash noted on exam. He quite gabapentin several months ago and he notes symptoms intermittently feel "burning" in nature, however this would be a very odd place to have neuropathic pain.  Pt has significant risk factors such as HTN and DM. No chest pain currently and EKG re-assuring. He's on an aspirin.  - referral to cardiology, I believe he'd benefit from a stress test. - will get BMET, CBC, and magnesium level. - strict reasons to seek care immediately.

## 2016-09-21 ENCOUNTER — Encounter: Payer: Self-pay | Admitting: Cardiology

## 2016-09-21 LAB — CBC WITH DIFFERENTIAL/PLATELET
BASOS ABS: 0 10*3/uL (ref 0.0–0.2)
Basos: 0 %
EOS (ABSOLUTE): 0.1 10*3/uL (ref 0.0–0.4)
Eos: 1 %
HEMOGLOBIN: 14.6 g/dL (ref 13.0–17.7)
Hematocrit: 44.7 % (ref 37.5–51.0)
IMMATURE GRANS (ABS): 0 10*3/uL (ref 0.0–0.1)
Immature Granulocytes: 0 %
LYMPHS: 32 %
Lymphocytes Absolute: 3.2 10*3/uL — ABNORMAL HIGH (ref 0.7–3.1)
MCH: 28.6 pg (ref 26.6–33.0)
MCHC: 32.7 g/dL (ref 31.5–35.7)
MCV: 88 fL (ref 79–97)
MONOCYTES: 4 %
Monocytes Absolute: 0.4 10*3/uL (ref 0.1–0.9)
NEUTROS ABS: 6.3 10*3/uL (ref 1.4–7.0)
Neutrophils: 63 %
PLATELETS: 221 10*3/uL (ref 150–379)
RBC: 5.1 x10E6/uL (ref 4.14–5.80)
RDW: 13.6 % (ref 12.3–15.4)
WBC: 10 10*3/uL (ref 3.4–10.8)

## 2016-09-21 LAB — BASIC METABOLIC PANEL
BUN/Creatinine Ratio: 16 (ref 9–20)
BUN: 15 mg/dL (ref 6–24)
CALCIUM: 9.9 mg/dL (ref 8.7–10.2)
CHLORIDE: 99 mmol/L (ref 96–106)
CO2: 27 mmol/L (ref 18–29)
Creatinine, Ser: 0.92 mg/dL (ref 0.76–1.27)
GFR calc non Af Amer: 98 mL/min/{1.73_m2} (ref >59)
GFR, EST AFRICAN AMERICAN: 113 mL/min/{1.73_m2} (ref >59)
GLUCOSE: 160 mg/dL — AB (ref 65–99)
POTASSIUM: 4.7 mmol/L (ref 3.5–5.2)
Sodium: 140 mmol/L (ref 134–144)

## 2016-09-21 LAB — MAGNESIUM: MAGNESIUM: 2 mg/dL (ref 1.6–2.3)

## 2016-09-22 ENCOUNTER — Encounter: Payer: Self-pay | Admitting: Family Medicine

## 2016-09-23 ENCOUNTER — Ambulatory Visit: Payer: Medicare Other | Admitting: Cardiology

## 2016-10-02 DIAGNOSIS — G4733 Obstructive sleep apnea (adult) (pediatric): Secondary | ICD-10-CM | POA: Diagnosis not present

## 2016-10-04 ENCOUNTER — Ambulatory Visit (INDEPENDENT_AMBULATORY_CARE_PROVIDER_SITE_OTHER): Payer: Medicare Other | Admitting: Cardiology

## 2016-10-04 ENCOUNTER — Encounter: Payer: Self-pay | Admitting: Cardiology

## 2016-10-04 VITALS — BP 138/90 | HR 86 | Ht 67.0 in | Wt 285.0 lb

## 2016-10-04 DIAGNOSIS — R079 Chest pain, unspecified: Secondary | ICD-10-CM
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
## 2016-10-04 NOTE — Patient Instructions (Signed)
Medication Instructions: Your physician recommends that you continue on your current medications as directed. Please refer to the Current Medication list given to you today.   Labwork: None ordered  Procedures/Testing:  Your physician has requested that you have a lexiscan myoview. For further information please visit [email or web address] Please follow instruction sheet, as given.    Follow-Up: To be determined depending on lexi scan results.  Any Additional Special Instructions Will Be Listed Below (If Applicable).     Pharmacologic Stress Electrocardiogram Introduction A pharmacologic stress electrocardiogram is a heart (cardiac) test that uses nuclear imaging to evaluate the blood supply to your heart. This test may also be called a pharmacologic stress electrocardiography. Pharmacologic means that a medicine is used to increase your heart rate and blood pressure. This stress test is done to find areas of poor blood flow to the heart by determining the extent of coronary artery disease (CAD). Some people exercise on a treadmill, which naturally increases the blood flow to the heart. For those people unable to exercise on a treadmill, a medicine is used. This medicine stimulates your heart and will cause your heart to beat harder and more quickly, as if you were exercising. Pharmacologic stress tests can help determine:  The adequacy of blood flow to your heart during increased levels of activity in order to clear you for discharge home.  The extent of coronary artery blockage caused by CAD.  Your prognosis if you have suffered a heart attack.  The effectiveness of cardiac procedures done, such as an angioplasty, which can increase the circulation in your coronary arteries.  Causes of chest pain or pressure. LET [facility] CARE PROVIDER KNOW ABOUT:  Any allergies you have.  All medicines you are taking, including vitamins, herbs, eye drops, creams, and over-the-counter  medicines.  Previous problems you or members of your family have had with the use of anesthetics.  Any blood disorders you have.  Previous surgeries you have had.  Medical conditions you have.  Possibility of pregnancy, if this applies.  If you are currently breastfeeding. RISKS AND COMPLICATIONS Generally, this is a safe procedure. However, as with any procedure, complications can occur. Possible complications include:  You develop pain or pressure in the following areas:  Chest.  Jaw or neck.  Between your shoulder blades.  Radiating down your left arm.  Headache.  Dizziness or light-headedness.  Shortness of breath.  Increased or irregular heartbeat.  Low blood pressure.  Nausea or vomiting.  Flushing.  Redness going up the arm and slight pain during injection of medicine.  Heart attack (rare). BEFORE THE PROCEDURE  Avoid all forms of caffeine for 24 hours before your test or as directed by your health care provider. This includes coffee, tea (even decaffeinated tea), caffeinated sodas, chocolate, cocoa, and certain pain medicines.  Follow your health care provider's instructions regarding eating and drinking before the test.  Take your medicines as directed at regular times with water unless instructed otherwise. Exceptions may include:  If you have diabetes, ask how you are to take your insulin or pills. It is common to adjust insulin dosing the morning of the test.  If you are taking beta-blocker medicines, it is important to talk to your health care provider about these medicines well before the date of your test. Taking beta-blocker medicines may interfere with the test. In some cases, these medicines need to be changed or stopped 24 hours or more before the test.  If you wear a nitroglycerin patch, it  may need to be removed prior to the test. Ask your health care provider if the patch should be removed before the test.  If you use an inhaler for any  breathing condition, bring it with you to the test.  If you are an outpatient, bring a snack so you can eat right after the stress phase of the test.  Do not smoke for 4 hours prior to the test or as directed by your health care provider.  Do not apply lotions, powders, creams, or oils on your chest prior to the test.  Wear comfortable shoes and clothing. Let your health care provider know if you were unable to complete or follow the preparations for your test. PROCEDURE  Multiple patches (electrodes) will be put on your chest. If needed, small areas of your chest may be shaved to get better contact with the electrodes. Once the electrodes are attached to your body, multiple wires will be attached to the electrodes, and your heart rate will be monitored.  An IV access will be started. A nuclear trace (isotope) is given. The isotope may be given intravenously, or it may be swallowed. Nuclear refers to several types of radioactive isotopes, and the nuclear isotope lights up the arteries so that the nuclear images are clear. The isotope is absorbed by your body. This results in low radiation exposure.  A resting nuclear image is taken to show how your heart functions at rest.  A medicine is given through the IV access.  A second scan is done about 1 hour after the medicine injection and determines how your heart functions under stress.  During this stress phase, you will be connected to an electrocardiogram machine. Your blood pressure and oxygen levels will be monitored. What to expect after the procedure  Your heart rate and blood pressure will be monitored after the test.  You may return to your normal schedule, including diet,activities, and medicines, unless your health care provider tells you otherwise. This information is not intended to replace advice given to you by your health care provider. Make sure you discuss any questions you have with your health care provider. Document  Released: 10/17/2008 Document Revised: 11/06/2015 Document Reviewed: 12/08/2015 Elsevier Interactive Patient Education  2017 [employer].       If you need a refill on your cardiac medications before your next appointment, please call your pharmacy.

## 2016-10-11 DIAGNOSIS — M79606 Pain in leg, unspecified: Secondary | ICD-10-CM | POA: Diagnosis not present

## 2016-10-11 DIAGNOSIS — M961 Postlaminectomy syndrome, not elsewhere classified: Secondary | ICD-10-CM | POA: Diagnosis not present

## 2016-10-11 DIAGNOSIS — Z79899 Other long term (current) drug therapy: Secondary | ICD-10-CM | POA: Diagnosis not present

## 2016-10-11 DIAGNOSIS — Z79891 Long term (current) use of opiate analgesic: Secondary | ICD-10-CM | POA: Diagnosis not present

## 2016-10-11 DIAGNOSIS — G894 Chronic pain syndrome: Secondary | ICD-10-CM | POA: Diagnosis not present

## 2016-10-11 DIAGNOSIS — M5137 Other intervertebral disc degeneration, lumbosacral region: Secondary | ICD-10-CM | POA: Diagnosis not present

## 2016-10-12 ENCOUNTER — Other Ambulatory Visit: Payer: Self-pay | Admitting: Family Medicine

## 2016-10-12 NOTE — Telephone Encounter (Signed)
[facility] told pt they have been sending faxes every 3 days requesting refill on his needles.  Please fax refill to them

## 2016-10-13 MED ORDER — "SYRINGE/NEEDLE (DISP) 25G X 5/8"" 3 ML MISC": 12 refills | Status: DC

## 2016-10-13 NOTE — Telephone Encounter (Signed)
Please inform the patient that I have not received anything from his pharmacy, I have sent over a Rx for his needles.  Let our office know if there is still a problem.  Thanks, Joanna Puff, MD [facility] Family Medicine Resident  10/13/2016, 8:32 AM

## 2016-10-14 ENCOUNTER — Other Ambulatory Visit: Payer: Self-pay | Admitting: Family Medicine

## 2016-10-14 ENCOUNTER — Telehealth (HOSPITAL_COMMUNITY): Payer: Self-pay | Admitting: *Deleted

## 2016-10-14 NOTE — Telephone Encounter (Signed)
Left message on voicemail in reference to upcoming appointment scheduled for 10/18/16. Phone number given for a call back so details instructions can be given.  [name], [name]

## 2016-10-18 ENCOUNTER — Ambulatory Visit (HOSPITAL_COMMUNITY): Payer: Medicare Other | Attending: Cardiology

## 2016-10-18 DIAGNOSIS — R079 Chest pain, unspecified: Secondary | ICD-10-CM

## 2016-10-18 MED ORDER — REGADENOSON 0.4 MG/5ML IV SOLN
0.4000 mg | Freq: Once | INTRAVENOUS | Status: AC
  Administered 2016-10-18: 0.4 mg via INTRAVENOUS

## 2016-10-18 MED ORDER — TECHNETIUM TC 99M TETROFOSMIN IV KIT
33.0000 | PACK | Freq: Once | INTRAVENOUS | Status: AC | PRN
  Administered 2016-10-18: 33 via INTRAVENOUS
  Filled 2016-10-18: qty 33

## 2016-10-19 ENCOUNTER — Ambulatory Visit (HOSPITAL_COMMUNITY): Payer: Medicare Other | Attending: Cardiovascular Disease

## 2016-10-19 LAB — MYOCARDIAL PERFUSION IMAGING
CHL CUP NUCLEAR SSS: 3
LHR: 0.29
LVDIAVOL: 170 mL (ref 62–150)
LVSYSVOL: 73 mL
Peak HR: 82 {beats}/min
Rest HR: 71 {beats}/min
SDS: 1
SRS: 2
TID: 0.84

## 2016-10-19 MED ORDER — TECHNETIUM TC 99M TETROFOSMIN IV KIT
32.0000 | PACK | Freq: Once | INTRAVENOUS | Status: AC | PRN
  Administered 2016-10-19: 32 via INTRAVENOUS
  Filled 2016-10-19: qty 32

## 2016-10-21 ENCOUNTER — Other Ambulatory Visit: Payer: Self-pay | Admitting: Family Medicine

## 2016-11-01 DIAGNOSIS — G4733 Obstructive sleep apnea (adult) (pediatric): Secondary | ICD-10-CM | POA: Diagnosis not present

## 2016-11-09 DIAGNOSIS — M47817 Spondylosis without myelopathy or radiculopathy, lumbosacral region: Secondary | ICD-10-CM | POA: Diagnosis not present

## 2016-11-09 DIAGNOSIS — Z79891 Long term (current) use of opiate analgesic: Secondary | ICD-10-CM | POA: Diagnosis not present

## 2016-11-09 DIAGNOSIS — Z79899 Other long term (current) drug therapy: Secondary | ICD-10-CM | POA: Diagnosis not present

## 2016-11-09 DIAGNOSIS — M961 Postlaminectomy syndrome, not elsewhere classified: Secondary | ICD-10-CM | POA: Diagnosis not present

## 2016-11-09 DIAGNOSIS — M5137 Other intervertebral disc degeneration, lumbosacral region: Secondary | ICD-10-CM | POA: Diagnosis not present

## 2016-11-09 DIAGNOSIS — G894 Chronic pain syndrome: Secondary | ICD-10-CM | POA: Diagnosis not present

## 2016-11-18 ENCOUNTER — Other Ambulatory Visit: Payer: Self-pay | Admitting: Pain Medicine

## 2016-11-18 DIAGNOSIS — M545 Low back pain: Secondary | ICD-10-CM

## 2016-11-22 ENCOUNTER — Other Ambulatory Visit: Payer: Self-pay | Admitting: *Deleted

## 2016-11-22 NOTE — Telephone Encounter (Signed)
Pharmacy requesting 90 day supply

## 2016-11-23 MED ORDER — HYDROCHLOROTHIAZIDE 25 MG PO TABS: ORAL_TABLET | ORAL | 2 refills | Status: DC

## 2016-11-25 DIAGNOSIS — M5137 Other intervertebral disc degeneration, lumbosacral region: Secondary | ICD-10-CM | POA: Diagnosis not present

## 2016-11-25 DIAGNOSIS — Z79899 Other long term (current) drug therapy: Secondary | ICD-10-CM | POA: Diagnosis not present

## 2016-11-25 DIAGNOSIS — M961 Postlaminectomy syndrome, not elsewhere classified: Secondary | ICD-10-CM | POA: Diagnosis not present

## 2016-11-25 DIAGNOSIS — M47817 Spondylosis without myelopathy or radiculopathy, lumbosacral region: Secondary | ICD-10-CM | POA: Diagnosis not present

## 2016-11-25 DIAGNOSIS — G894 Chronic pain syndrome: Secondary | ICD-10-CM | POA: Diagnosis not present

## 2016-11-25 DIAGNOSIS — Z79891 Long term (current) use of opiate analgesic: Secondary | ICD-10-CM | POA: Diagnosis not present

## 2016-12-02 DIAGNOSIS — G4733 Obstructive sleep apnea (adult) (pediatric): Secondary | ICD-10-CM | POA: Diagnosis not present

## 2016-12-03 ENCOUNTER — Ambulatory Visit
Admission: RE | Admit: 2016-12-03 | Discharge: 2016-12-03 | Disposition: A | Discharge status: Home / Self Care | Payer: Medicare Other | Source: Ambulatory Visit | Attending: Pain Medicine | Admitting: Pain Medicine

## 2016-12-03 DIAGNOSIS — M545 Low back pain: Secondary | ICD-10-CM

## 2016-12-03 DIAGNOSIS — M48061 Spinal stenosis, lumbar region without neurogenic claudication: Secondary | ICD-10-CM | POA: Diagnosis not present

## 2016-12-07 DIAGNOSIS — Z79899 Other long term (current) drug therapy: Secondary | ICD-10-CM | POA: Diagnosis not present

## 2016-12-07 DIAGNOSIS — Z79891 Long term (current) use of opiate analgesic: Secondary | ICD-10-CM | POA: Diagnosis not present

## 2016-12-07 DIAGNOSIS — M47817 Spondylosis without myelopathy or radiculopathy, lumbosacral region: Secondary | ICD-10-CM | POA: Diagnosis not present

## 2016-12-07 DIAGNOSIS — M961 Postlaminectomy syndrome, not elsewhere classified: Secondary | ICD-10-CM | POA: Diagnosis not present

## 2016-12-07 DIAGNOSIS — G894 Chronic pain syndrome: Secondary | ICD-10-CM | POA: Diagnosis not present

## 2016-12-07 DIAGNOSIS — M5137 Other intervertebral disc degeneration, lumbosacral region: Secondary | ICD-10-CM | POA: Diagnosis not present

## 2016-12-28 DIAGNOSIS — M545 Low back pain: Secondary | ICD-10-CM | POA: Diagnosis not present

## 2017-01-01 DIAGNOSIS — G4733 Obstructive sleep apnea (adult) (pediatric): Secondary | ICD-10-CM | POA: Diagnosis not present

## 2017-01-14 DIAGNOSIS — G894 Chronic pain syndrome: Secondary | ICD-10-CM | POA: Diagnosis not present

## 2017-01-14 DIAGNOSIS — Z79899 Other long term (current) drug therapy: Secondary | ICD-10-CM | POA: Diagnosis not present

## 2017-01-14 DIAGNOSIS — Z79891 Long term (current) use of opiate analgesic: Secondary | ICD-10-CM | POA: Diagnosis not present

## 2017-01-14 DIAGNOSIS — M47817 Spondylosis without myelopathy or radiculopathy, lumbosacral region: Secondary | ICD-10-CM | POA: Diagnosis not present

## 2017-01-14 DIAGNOSIS — M961 Postlaminectomy syndrome, not elsewhere classified: Secondary | ICD-10-CM | POA: Diagnosis not present

## 2017-01-14 DIAGNOSIS — M5137 Other intervertebral disc degeneration, lumbosacral region: Secondary | ICD-10-CM | POA: Diagnosis not present

## 2017-01-20 DIAGNOSIS — G4733 Obstructive sleep apnea (adult) (pediatric): Secondary | ICD-10-CM | POA: Diagnosis not present

## 2017-02-01 DIAGNOSIS — G4733 Obstructive sleep apnea (adult) (pediatric): Secondary | ICD-10-CM | POA: Diagnosis not present

## 2017-02-09 DIAGNOSIS — Z79891 Long term (current) use of opiate analgesic: Secondary | ICD-10-CM | POA: Diagnosis not present

## 2017-02-09 DIAGNOSIS — G894 Chronic pain syndrome: Secondary | ICD-10-CM | POA: Diagnosis not present

## 2017-02-09 DIAGNOSIS — M47817 Spondylosis without myelopathy or radiculopathy, lumbosacral region: Secondary | ICD-10-CM | POA: Diagnosis not present

## 2017-02-09 DIAGNOSIS — M545 Low back pain: Secondary | ICD-10-CM | POA: Diagnosis not present

## 2017-02-09 DIAGNOSIS — Z79899 Other long term (current) drug therapy: Secondary | ICD-10-CM | POA: Diagnosis not present

## 2017-02-09 DIAGNOSIS — M961 Postlaminectomy syndrome, not elsewhere classified: Secondary | ICD-10-CM | POA: Diagnosis not present

## 2017-02-16 ENCOUNTER — Telehealth: Payer: Self-pay | Admitting: Family Medicine

## 2017-02-16 DIAGNOSIS — M961 Postlaminectomy syndrome, not elsewhere classified: Secondary | ICD-10-CM | POA: Diagnosis not present

## 2017-02-16 DIAGNOSIS — Z79891 Long term (current) use of opiate analgesic: Secondary | ICD-10-CM | POA: Diagnosis not present

## 2017-02-16 DIAGNOSIS — M549 Dorsalgia, unspecified: Secondary | ICD-10-CM

## 2017-02-16 DIAGNOSIS — M545 Low back pain: Secondary | ICD-10-CM | POA: Diagnosis not present

## 2017-02-16 DIAGNOSIS — Z79899 Other long term (current) drug therapy: Secondary | ICD-10-CM | POA: Diagnosis not present

## 2017-02-16 DIAGNOSIS — M5137 Other intervertebral disc degeneration, lumbosacral region: Secondary | ICD-10-CM | POA: Diagnosis not present

## 2017-02-16 DIAGNOSIS — G894 Chronic pain syndrome: Secondary | ICD-10-CM | POA: Diagnosis not present

## 2017-02-16 DIAGNOSIS — M47817 Spondylosis without myelopathy or radiculopathy, lumbosacral region: Secondary | ICD-10-CM | POA: Diagnosis not present

## 2017-02-16 NOTE — Telephone Encounter (Signed)
Pt is calling and would like a new referral to [facility]. He wants to transfer to this office over the one he is currently at. Please fax referral to them at [phone] attention Leretha DykesAnn Maynard. jw

## 2017-02-22 NOTE — Telephone Encounter (Signed)
Family Medicine Update Note  I have made the referral to [facility] and I have faxed the referral  Myrene BuddyJacob Damire Remedios MD PGY-1 [facility]

## 2017-03-01 ENCOUNTER — Other Ambulatory Visit: Payer: Self-pay | Admitting: *Deleted

## 2017-03-01 MED ORDER — HYDROCHLOROTHIAZIDE 25 MG PO TABS: ORAL_TABLET | ORAL | 2 refills | Status: DC

## 2017-03-03 ENCOUNTER — Telehealth: Payer: Self-pay | Admitting: Family Medicine

## 2017-03-03 NOTE — Telephone Encounter (Signed)
I have submitted his form as of today.

## 2017-03-03 NOTE — Telephone Encounter (Signed)
Pt called and wanted to know if we sent his referral to [facility] attention Leretha Dykes? If not can we re-fax this and put this attention to Leretha Dykes. jw

## 2017-03-04 DIAGNOSIS — G4733 Obstructive sleep apnea (adult) (pediatric): Secondary | ICD-10-CM | POA: Diagnosis not present

## 2017-03-14 ENCOUNTER — Telehealth: Payer: Self-pay | Admitting: Family Medicine

## 2017-03-14 NOTE — Telephone Encounter (Signed)
Pt called again.  He says [facility] has never received the referral. He will be glad to come pick up the referral and take it over there if needed. Please advise

## 2017-03-16 DIAGNOSIS — G894 Chronic pain syndrome: Secondary | ICD-10-CM | POA: Diagnosis not present

## 2017-03-16 DIAGNOSIS — M961 Postlaminectomy syndrome, not elsewhere classified: Secondary | ICD-10-CM | POA: Diagnosis not present

## 2017-03-16 DIAGNOSIS — M47817 Spondylosis without myelopathy or radiculopathy, lumbosacral region: Secondary | ICD-10-CM | POA: Diagnosis not present

## 2017-03-16 DIAGNOSIS — Z79899 Other long term (current) drug therapy: Secondary | ICD-10-CM | POA: Diagnosis not present

## 2017-03-16 DIAGNOSIS — M5137 Other intervertebral disc degeneration, lumbosacral region: Secondary | ICD-10-CM | POA: Diagnosis not present

## 2017-03-16 DIAGNOSIS — Z79891 Long term (current) use of opiate analgesic: Secondary | ICD-10-CM | POA: Diagnosis not present

## 2017-03-17 NOTE — Telephone Encounter (Signed)
Family Medicine Progress Note  Called to inform patient that I have received word from [facility] management that he is out of network and would have to pay out of pocket. He said that this was not an option and that he would try and figure out which insurance is in network for [employer]. Informed him let us know if he needs any help with this process.  Myrene Buddy MD PGY-1 Family Medicine Resident

## 2017-04-04 DIAGNOSIS — Z79899 Other long term (current) drug therapy: Secondary | ICD-10-CM | POA: Diagnosis not present

## 2017-04-04 DIAGNOSIS — M79606 Pain in leg, unspecified: Secondary | ICD-10-CM | POA: Diagnosis not present

## 2017-04-04 DIAGNOSIS — M545 Low back pain: Secondary | ICD-10-CM | POA: Diagnosis not present

## 2017-04-04 DIAGNOSIS — G894 Chronic pain syndrome: Secondary | ICD-10-CM | POA: Diagnosis not present

## 2017-04-04 DIAGNOSIS — M961 Postlaminectomy syndrome, not elsewhere classified: Secondary | ICD-10-CM | POA: Diagnosis not present

## 2017-04-04 DIAGNOSIS — Z79891 Long term (current) use of opiate analgesic: Secondary | ICD-10-CM | POA: Diagnosis not present

## 2017-04-13 ENCOUNTER — Other Ambulatory Visit: Payer: Self-pay | Admitting: *Deleted

## 2017-04-13 NOTE — Telephone Encounter (Signed)
Patient left message on nurse line requesting refill of lantus today as he is completely out. Kinnie FeilL. Job Holtsclaw, RN, BSN

## 2017-04-14 MED ORDER — INSULIN GLARGINE 100 UNIT/ML ~~LOC~~ SOLN: SUBCUTANEOUS | 5 refills | Status: DC

## 2017-04-18 ENCOUNTER — Telehealth: Payer: Self-pay | Admitting: *Deleted

## 2017-04-18 DIAGNOSIS — M47817 Spondylosis without myelopathy or radiculopathy, lumbosacral region: Secondary | ICD-10-CM | POA: Diagnosis not present

## 2017-04-18 DIAGNOSIS — G894 Chronic pain syndrome: Secondary | ICD-10-CM | POA: Diagnosis not present

## 2017-04-18 DIAGNOSIS — M545 Low back pain: Secondary | ICD-10-CM | POA: Diagnosis not present

## 2017-04-18 DIAGNOSIS — Z79891 Long term (current) use of opiate analgesic: Secondary | ICD-10-CM | POA: Diagnosis not present

## 2017-04-18 DIAGNOSIS — Z79899 Other long term (current) drug therapy: Secondary | ICD-10-CM | POA: Diagnosis not present

## 2017-04-18 DIAGNOSIS — M961 Postlaminectomy syndrome, not elsewhere classified: Secondary | ICD-10-CM | POA: Diagnosis not present

## 2017-04-18 NOTE — Telephone Encounter (Signed)
Patient left message on nurse line requesting to speak with MD, state his insurance will not cover is CPAP supplies because MD is not certified through Medicare.

## 2017-04-25 NOTE — Telephone Encounter (Signed)
Family Medicine Telephone Note  Issue appears to be that medicare never received clinic notes prior to or the day of his sleep study. Have printed off the full 36 page pulmonology note and have faxed to [facility] at [phone]. I believe this should clear up this issue. Will allow fax to go through and follow up with [facility] once they have received. Patient informed and is aware.  Myrene BuddyJacob Terrye Dombrosky MD PGY-1 Family Medicine Resident

## 2017-04-25 NOTE — Telephone Encounter (Signed)
Pt is calling again because he is saying his insurance will not pay for any of his CPAP supplies. Pt's insurance company is saying none of the dr's at our practice have the certificate to do so. Pt needs to speak to a dr or nurse asap because his insurance is making him pay in full out of his own pocket. Please give him a call about this issue. Pt says he's been trying to get this figured out with his dr for a while now.

## 2017-04-27 NOTE — Progress Notes (Signed)
   Subjective:    Patient ID: [name], male    DOB: 1966-06-05, 51 y.o.   MRN: [id number]   CC: fungal infection on scalp & ED  HPI: Fungal scalp infection  Patient today with complaints of redness and pruritis of scalp. Patient has history of seborrheic dermatitis and wants to obtain treatment before rash becomes severe. Symptoms worsen after hot shower. States that he has used triamcinolone cream (on beard area) and ketoconazole shampoo (on scalp) with great improvement in the past. Patient was most recently seen in 07/2016 at which time he received prescriptions and had noted improvement. Patient reports that rash is bilateral on temporal portions of scalp. No drainage noted. No recent changes in shampoos or detergents.   Erectile Dysfunction  Patient with concerns of low testosterone. States that he is very tired, has no libido, and noticed he has had erectile dysfunctions. Symptoms have occurred x 6 months. Patient states she talked to his dad, who has history of low testosterone, who informed him these were the symptoms he presented with when diagnosed. Patient notes no recent stressors, stress of caring for parents but this has been a life long stress. Patient has been married to his wife of 25 years and reports normal libido prior to 6 mo ago. No nocturnal tumescence noted. States some urinary changes of increased frequency at night and some urine leakage. Notes some change in stream. States no history of prostate disorders in himself or family. No cardiac history in patient. Patient attempted to have Cialis in past but insurance would not cover medication.   Smoking status reviewed. Former smoker, quit in 2013.   Objective:  BP 128/82   Pulse 78   Temp 98.6 F (37 C) (Oral)   Ht 5\' 7"  (1.702 m)   Wt 284 lb (128.8 kg)   SpO2 95%   BMI 44.48 kg/m  Vitals and nursing note reviewed  General: well nourished, in no acute distress Cardiac: RRR, clear S1 and S2, no murmurs, rubs,  or gallops Respiratory: clear to auscultation bilaterally, no increased work of breathing Abdomen: soft, nontender, nondistended, no masses or organomegaly. Bowel sounds present Extremities: no edema or cyanosis. Warm, well perfused. Tenderness to palpation bilaterally  Skin: warm and dry, erythema noted in temporal region bilaterally. Excoriations noted on temples. Slight scaling on face.  Neuro: alert and oriented, no focal deficits  Assessment & Plan:   Seborrheic dermatitis of scalp Patient with begging rash of seborrheic dermatitis of scalp. Pruritis noted and worsens after hot shower. Patient with improvement in past with triamcinolone cream and ketoconazole shampoo. No signs of skin or soft tissue infection noted. Unlikely contact dermatitis given no new shampoos or soaps.  -prescribed ketoconazole shampoo and triamcinolone cream -follow up if no improvement   Erectile dysfunction Patient with fatigue, decreased, libido, and erectile dysfunction. Differentials include low testosterone, poor diabetes control, hypothyroidism, or psychosocial reasoning. Last A1C 7.2  -will obtain am testosterone and TSH. Will call or send letter with results. Further lab work and treatment pending lab results.  -encouraged good diabetic control  -prescribed Viagra 50mg  once daily as needed  -follow up as needed    Return if symptoms worsen or fail to improve.  Oralia ManisSherin Hiram Mciver, DO, PGY-1

## 2017-04-28 ENCOUNTER — Other Ambulatory Visit: Payer: Self-pay

## 2017-04-28 ENCOUNTER — Encounter: Payer: Self-pay | Admitting: Family Medicine

## 2017-04-28 ENCOUNTER — Ambulatory Visit (INDEPENDENT_AMBULATORY_CARE_PROVIDER_SITE_OTHER): Payer: Medicare Other | Admitting: Family Medicine

## 2017-04-28 VITALS — BP 128/82 | HR 78 | Temp 98.6°F | Ht 67.0 in | Wt 284.0 lb

## 2017-04-28 DIAGNOSIS — R5383 Other fatigue: Secondary | ICD-10-CM

## 2017-04-28 DIAGNOSIS — N529 Male erectile dysfunction, unspecified: Secondary | ICD-10-CM

## 2017-04-28 DIAGNOSIS — L219 Seborrheic dermatitis, unspecified: Secondary | ICD-10-CM

## 2017-04-28 MED ORDER — KETOCONAZOLE 2 % EX SHAM: 1.0000 "application " | MEDICATED_SHAMPOO | Freq: Every day | CUTANEOUS | 0 refills | Status: DC

## 2017-04-28 MED ORDER — SILDENAFIL CITRATE 50 MG PO TABS: 50.0000 mg | ORAL_TABLET | Freq: Every day | ORAL | 0 refills | Status: DC | PRN

## 2017-04-28 MED ORDER — TRIAMCINOLONE ACETONIDE 0.1 % EX CREA: 1.0000 "application " | TOPICAL_CREAM | Freq: Two times a day (BID) | CUTANEOUS | 0 refills | Status: DC

## 2017-04-28 NOTE — Assessment & Plan Note (Addendum)
Patient with begging rash of seborrheic dermatitis of scalp. Pruritis noted and worsens after hot shower. Patient with improvement in past with triamcinolone cream and ketoconazole shampoo. No signs of skin or soft tissue infection noted. Unlikely contact dermatitis given no new shampoos or soaps.  -prescribed ketoconazole shampoo and triamcinolone cream -follow up if no improvement

## 2017-04-28 NOTE — Patient Instructions (Addendum)
Seborrheic Dermatitis, Adult Seborrheic dermatitis is a skin disease that causes red, scaly patches. It usually occurs on the scalp, and it is often called dandruff. The patches may appear on other parts of the body. Skin patches tend to appear where there are many oil glands in the skin. Areas of the body that are commonly affected include:  Scalp.  Skin folds of the body.  Ears.  Eyebrows.  Neck.  Face.  Armpits.  The bearded area of men's faces.  The condition may come and go for no known reason, and it is often long-lasting (chronic). What are the causes? The cause of this condition is not known. What increases the risk? This condition is more likely to develop in people who:  Have certain conditions, such as: ? HIV (human immunodeficiency virus). ? AIDS (acquired immunodeficiency syndrome). ? Parkinson disease. ? Mood disorders, such as depression.  Are 3640-49 years old.  What are the signs or symptoms? Symptoms of this condition include:  Thick scales on the scalp.  Redness on the face or in the armpits.  Skin that is flaky. The flakes may be white or yellow.  Skin that seems oily or dry but is not helped with moisturizers.  Itching or burning in the affected areas.  How is this diagnosed? This condition is diagnosed with a medical history and physical exam. A sample of your skin may be tested (skin biopsy). You may need to see a skin specialist (dermatologist). How is this treated? There is no cure for this condition, but treatment can help to manage the symptoms. You may get treatment to remove scales, lower the risk of skin infection, and reduce swelling or itching. Treatment may include:  Creams that reduce swelling and irritation (steroids).  Creams that reduce skin yeast.  Medicated shampoo, soaps, moisturizing creams, or ointments.  Medicated moisturizing creams or ointments.  Follow these instructions at home:  Apply over-the-counter and  prescription medicines only as told by your health care provider.  Use any medicated shampoo, soaps, skin creams, or ointments only as told by your health care provider.  Keep all follow-up visits as told by your health care provider. This is important. Contact a health care provider if:  Your symptoms do not improve with treatment.  Your symptoms get worse.  You have new symptoms. This information is not intended to replace advice given to you by your health care provider. Make sure you discuss any questions you have with your health care provider. Document Released: 05/31/2005 Document Revised: 12/19/2015 Document Reviewed: 09/18/2015 Elsevier Interactive Patient Education  [employer]2018 Elsevier Inc.   It was a pleasure meeting you today.   Today we discussed your scalp rash and your fatigue.  For your rash: I have prescribed you the ketoconazole shampoo and triamcinolone cream. Please be careful using the cream on your face as this can thin your skin. Use sparingly.   For your fatigue: I have ordered a TSH and am testosterone. This level must be taken first thing in the morning so you must make an appointment to have this drawn. The order for the lab is complete.   For erectile dysfunction: I have ordered viagra to be taken as needed 1 hour before intercourse as needed. Uncontrolled diabetes can also be a cause of ED.   I will either call or send a letter with results.   Please follow up as needed or sooner if symptoms persist or worsen. Please call the clinic immediately if you  Have concerns.  Our clinic's number is [phone]. Please call with questions or concerns.   Thank you,  Oralia ManisSherin Thursa Emme, DO

## 2017-04-28 NOTE — Assessment & Plan Note (Signed)
Patient with fatigue, decreased, libido, and erectile dysfunction. Differentials include low testosterone, poor diabetes control, hypothyroidism, or psychosocial reasoning. Last A1C 7.2  -will obtain am testosterone and TSH. Will call or send letter with results. Further lab work and treatment pending lab results.  -encouraged good diabetic control  -prescribed Viagra 50mg  once daily as needed  -follow up as needed

## 2017-05-02 ENCOUNTER — Other Ambulatory Visit: Payer: Medicare Other

## 2017-05-02 DIAGNOSIS — R5383 Other fatigue: Secondary | ICD-10-CM | POA: Diagnosis not present

## 2017-05-02 DIAGNOSIS — N529 Male erectile dysfunction, unspecified: Secondary | ICD-10-CM

## 2017-05-03 LAB — TESTOSTERONE: Testosterone: 147 ng/dL — ABNORMAL LOW (ref 264–916)

## 2017-05-03 LAB — TSH: TSH: 1.66 u[IU]/mL (ref 0.450–4.500)

## 2017-05-10 ENCOUNTER — Other Ambulatory Visit: Payer: Self-pay | Admitting: Family Medicine

## 2017-05-10 DIAGNOSIS — N529 Male erectile dysfunction, unspecified: Secondary | ICD-10-CM

## 2017-05-10 NOTE — Progress Notes (Signed)
Patient with low testosterone on 05/02/17 of 147. Called patient to inform of results and plan. Possible differentials include obesity and diabetes. Possible endocrine origin.  -plan for repeat am testosterone. Patient aware that this is taken first thing in the morning.  -measure free T4.  -LH, FSH -CBC, will need to monitor hematocrit if beginning treatment  -PSA -if repeat labs also show low testosterone, consider topical therapy daily. Will need to inform patient that he should be very careful to not have topical therapy touch other objects or other contacts. If beginning therapy will need to monitor testosterone every 3 months and then can transition to every 6 month checks if stabilized. Will need to monitor hematocrit if beginning treatment  -if [facility] are also altered, consider referral to endocrinology for further work up  -patient verbalized agreement with plan and stated he will make follow up appointment for am labs.  -plan discussed with preceptor, Dr. Laruth BouchardWendling  Ninah Moccio, DO, PGY-1 [facility] 05/10/2017 12:20 PM

## 2017-05-16 ENCOUNTER — Other Ambulatory Visit: Payer: Medicare Other

## 2017-05-16 DIAGNOSIS — M961 Postlaminectomy syndrome, not elsewhere classified: Secondary | ICD-10-CM | POA: Diagnosis not present

## 2017-05-16 DIAGNOSIS — N529 Male erectile dysfunction, unspecified: Secondary | ICD-10-CM

## 2017-05-16 DIAGNOSIS — Z79899 Other long term (current) drug therapy: Secondary | ICD-10-CM | POA: Diagnosis not present

## 2017-05-16 DIAGNOSIS — Z79891 Long term (current) use of opiate analgesic: Secondary | ICD-10-CM | POA: Diagnosis not present

## 2017-05-16 DIAGNOSIS — G894 Chronic pain syndrome: Secondary | ICD-10-CM | POA: Diagnosis not present

## 2017-05-16 DIAGNOSIS — M47817 Spondylosis without myelopathy or radiculopathy, lumbosacral region: Secondary | ICD-10-CM | POA: Diagnosis not present

## 2017-05-16 DIAGNOSIS — M79606 Pain in leg, unspecified: Secondary | ICD-10-CM | POA: Diagnosis not present

## 2017-05-17 LAB — CBC WITH DIFFERENTIAL/PLATELET
BASOS ABS: 0 10*3/uL (ref 0.0–0.2)
BASOS: 0 %
EOS (ABSOLUTE): 0.2 10*3/uL (ref 0.0–0.4)
Eos: 3 %
HEMOGLOBIN: 14.5 g/dL (ref 13.0–17.7)
Hematocrit: 42 % (ref 37.5–51.0)
IMMATURE GRANS (ABS): 0 10*3/uL (ref 0.0–0.1)
Immature Granulocytes: 1 %
LYMPHS: 44 %
Lymphocytes Absolute: 3.1 10*3/uL (ref 0.7–3.1)
MCH: 30.9 pg (ref 26.6–33.0)
MCHC: 34.5 g/dL (ref 31.5–35.7)
MCV: 90 fL (ref 79–97)
MONOCYTES: 6 %
Monocytes Absolute: 0.4 10*3/uL (ref 0.1–0.9)
NEUTROS PCT: 46 %
Neutrophils Absolute: 3.4 10*3/uL (ref 1.4–7.0)
Platelets: 224 10*3/uL (ref 150–379)
RBC: 4.69 x10E6/uL (ref 4.14–5.80)
RDW: 14.2 % (ref 12.3–15.4)
WBC: 7.1 10*3/uL (ref 3.4–10.8)

## 2017-05-17 LAB — FSH/LH
FSH: 3.1 m[IU]/mL (ref 1.5–12.4)
LH: 3.9 m[IU]/mL (ref 1.7–8.6)

## 2017-05-17 LAB — TESTOSTERONE: Testosterone: 174 ng/dL — ABNORMAL LOW (ref 264–916)

## 2017-05-17 LAB — PSA: PROSTATE SPECIFIC AG, SERUM: 0.2 ng/mL (ref 0.0–4.0)

## 2017-05-19 ENCOUNTER — Other Ambulatory Visit: Payer: Self-pay | Admitting: Family Medicine

## 2017-05-19 DIAGNOSIS — R7989 Other specified abnormal findings of blood chemistry: Secondary | ICD-10-CM

## 2017-05-19 DIAGNOSIS — N529 Male erectile dysfunction, unspecified: Secondary | ICD-10-CM

## 2017-05-19 MED ORDER — TESTOSTERONE CYPIONATE 200 MG/ML IM SOLN: 150.0000 mg | INTRAMUSCULAR | 0 refills | Status: DC

## 2017-05-19 MED ORDER — TADALAFIL 10 MG PO TABS: 10.0000 mg | ORAL_TABLET | ORAL | 3 refills | Status: DC | PRN

## 2017-05-19 NOTE — Progress Notes (Signed)
Received lab readings of 2nd low testosterone reading. LH, FSH wnl. CBC with diff showing baseline Hgb of 14.5 and Hct of 42.0 prior to therapy initiation. PSA wnl.  -will begin IM therapy daily (testosterone cypionate 150 mg every 2 weeks). Have informed patient that insurance may not cover it.  -will need to monitor testosterone q3 months (can transition to 6 months once stabilized). Patient aware and agreed to follow up -will need to monitor hematocrit during treatment  -will need to follow up in 3 months  When calling patient, patient informed me insurance will not cover Viagra, asking if we can prescribe Cialis instead.  -will try to prescribe Cialis but informed patient that insurance may not cover it   Oralia ManisSherin Prinston Kynard, DO, PGY-1 [facility] 05/19/2017 5:11 PM

## 2017-05-25 ENCOUNTER — Telehealth: Payer: Self-pay | Admitting: *Deleted

## 2017-05-25 NOTE — Telephone Encounter (Signed)
LMOVM for pt to call us back. Please see message below. Daria Mcmeekin, CMA  

## 2017-05-25 NOTE — Telephone Encounter (Signed)
-----   Message from Oralia ManisSherin Abraham, DO sent at 05/19/2017  5:15 PM EST ----- Please call patient to inform him IM testosterone prescription is at front ready for pick up and Cialis prescription has been sent to pharmacy. Attempted to call patient but was unable to reach him.

## 2017-05-26 ENCOUNTER — Telehealth: Payer: Self-pay | Admitting: *Deleted

## 2017-05-26 NOTE — Telephone Encounter (Signed)
Lm on nurse line.   He received a letter dismissing him from his pain management center.  Pt states that this is over him questioning about a billing issue.   Pt states that he needs this before the beginning of the year. Fleeger, Maryjo RochesterJessica Dawn, CMA

## 2017-05-31 NOTE — Telephone Encounter (Signed)
Patient on my schedule later in the week, will address at that visit.  Myrene BuddyJacob Vashon Arch MD PGY-1 Family Medicine Resident

## 2017-06-03 ENCOUNTER — Ambulatory Visit (INDEPENDENT_AMBULATORY_CARE_PROVIDER_SITE_OTHER): Payer: Medicare Other | Admitting: Family Medicine

## 2017-06-03 ENCOUNTER — Other Ambulatory Visit: Payer: Self-pay

## 2017-06-03 ENCOUNTER — Encounter: Payer: Self-pay | Admitting: Family Medicine

## 2017-06-03 VITALS — BP 110/70 | HR 77 | Temp 98.3°F | Ht 67.0 in | Wt 281.0 lb

## 2017-06-03 DIAGNOSIS — M5417 Radiculopathy, lumbosacral region: Secondary | ICD-10-CM

## 2017-06-03 DIAGNOSIS — E1142 Type 2 diabetes mellitus with diabetic polyneuropathy: Secondary | ICD-10-CM

## 2017-06-03 LAB — POCT UA - MICROALBUMIN
Creatinine, POC: 50 mg/dL
MICROALBUMIN (UR) POC: 150 mg/L

## 2017-06-03 LAB — POCT GLYCOSYLATED HEMOGLOBIN (HGB A1C): Hemoglobin A1C: 8.2

## 2017-06-03 NOTE — Patient Instructions (Signed)
It was a pleasure finally meeting you [name]. We discussed your pain management at today's clinic. I am sorry that you have had issues with your previous pain management clinic. I will make a referral when your insurance gets approved in early January. Regarding your diabetes we drew an A1c and will get a urine microalbumin. I am glad that your CPAP supplies have been approved. Have a merry christmas, I look forward to hearing from you in early January.

## 2017-06-04 DIAGNOSIS — G4733 Obstructive sleep apnea (adult) (pediatric): Secondary | ICD-10-CM | POA: Diagnosis not present

## 2017-06-10 NOTE — Progress Notes (Signed)
   HPI Patient presents to get a referral to a new pain management office. He describes his dismissal from his last pain management clinic as a billing issue which largely stems from him switching insurance companies to one that was not accepted by his former pain management. His new insurance will likely be approved in early January. 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  A1c last check in February. Will obtain check today. Patient feels that his a1c likely increased as he has not been as strict about his glucose management. Foot exam and urine microalbumin also performed.  CC: needs a new referral to pain management   ROS: Review of Systems  Constitutional: Negative for chills and fever.  HENT: Negative for hearing loss and sore throat.   Eyes: Negative for photophobia and pain.  Respiratory: Negative for cough, sputum production and shortness of breath.   Cardiovascular: Negative for chest pain and palpitations.  Gastrointestinal: Negative for abdominal pain, constipation, diarrhea, nausea and vomiting.  Genitourinary: Negative for dysuria and urgency.  Musculoskeletal: Positive for back pain. Negative for joint pain, myalgias and neck pain.  Skin: Negative for itching and rash.  Neurological: Negative for dizziness, weakness and headaches.  Psychiatric/Behavioral: Negative for depression.    CC, SH/smoking status, and VS noted  Objective: BP 110/70   Pulse 77   Temp 98.3 F (36.8 C) (Oral)   Ht 5\' 7"  (1.702 m)   Wt 281 lb (127.5 kg)   SpO2 96%   BMI 44.01 kg/m  Gen: NAD, obese, alert, cooperative, and pleasant. HEENT: NCAT, EOMI, PERRL CV: RRR, no murmur Resp: CTAB, no wheezes, non-labored Abd: SNTND, BS present, no guarding or organomegaly Ext: No edema, warm Neuro: Alert and oriented, Speech clear, No gross deficits, 5/5 strength BLE, 5/5 strength BUE. Tenderness to palpation in lumbar  spine.   Assessment and plan:  Lumbosacral neuritis Patient is contact clinic in early January when he has his insurance. I am concerned that if I place a referral now, then he will get placed into a pain management clinic which will also not take his insurance.  Type 2 diabetes mellitus with neurologic complication a1c up to 8.2 from 7.2 in February. He states that due to the holidays he has not been very good about his glucose management. He would like to try and improve his sugar with diet and see what happens in 3 months at his next check. Urine microalbumin negative. Will need to get optho exam. Performed foot exam in office.   Orders Placed This Encounter  Procedures  . POCT glycosylated hemoglobin (Hb A1C)  . POCT UA - Microalbumin    No orders of the defined types were placed in this encounter.   Myrene BuddyJacob Shyra Emile MD PGY-1 Family Medicine Resident 06/10/2017 9:29 AM

## 2017-06-10 NOTE — Assessment & Plan Note (Signed)
Patient is contact clinic in early January when he has his insurance. I am concerned that if I place a referral now, then he will get placed into a pain management clinic which will also not take his insurance.

## 2017-06-10 NOTE — Assessment & Plan Note (Signed)
a1c up to 8.2 from 7.2 in February. He states that due to the holidays he has not been very good about his glucose management. He would like to try and improve his sugar with diet and see what happens in 3 months at his next check. Urine microalbumin negative. Will need to get optho exam. Performed foot exam in office.

## 2017-06-17 ENCOUNTER — Telehealth: Payer: Self-pay | Admitting: Family Medicine

## 2017-06-17 DIAGNOSIS — G8929 Other chronic pain: Secondary | ICD-10-CM

## 2017-06-17 MED ORDER — HYDROCODONE-ACETAMINOPHEN 10-325 MG PO TABS: 1.0000 | ORAL_TABLET | ORAL | 0 refills | Status: DC | PRN

## 2017-06-17 NOTE — Telephone Encounter (Signed)
Needs refill on hydrocodone.  Dr Primitivo Gauzefletcher agreed to refill his pain meds until pt finds a pain mgt company

## 2017-06-21 NOTE — Telephone Encounter (Signed)
Have placed hydrocodone refill at front desk under "B" Folder. Please let patient know this is ready to pick up.  Myrene BuddyJacob Estela Vinal MD PGY-1 Family Medicine Resident

## 2017-06-21 NOTE — Telephone Encounter (Signed)
Pt already picked up rx, pt states that you wrote it for 30 tablets and that's only going to last him for a week. He states his insurance company is figuring out which pain mtg clinic he can go to and that you would have to write rx until he can find one. Deseree Bruna PotterBlount, CMA

## 2017-06-29 NOTE — Telephone Encounter (Signed)
Patient calling again asking for additional refills on hydrocone-acet till he can be seen by Pain Clinic. Kinnie FeilL. Suheyb Raucci, RN, BSN

## 2017-06-30 ENCOUNTER — Other Ambulatory Visit: Payer: Self-pay | Admitting: Family Medicine

## 2017-06-30 MED ORDER — HYDROCODONE-ACETAMINOPHEN 10-325 MG PO TABS: 1.0000 | ORAL_TABLET | ORAL | 0 refills | Status: DC | PRN

## 2017-06-30 NOTE — Telephone Encounter (Addendum)
Have refilled prescription of Norco and placed under folder marked "B" at the front desk. Please let patient know this is ready.  Myrene BuddyJacob Maryclaire Stoecker MD PGY-1 Family Medicine Resident

## 2017-07-01 NOTE — Telephone Encounter (Addendum)
Patient notified that Rx for hydrocodone-acet is ready to be picked up. Patient reports he is having difficulty finding Pain Clinic who will accept his insurance [facility]([employer]). Will route to Referral Coordinator for assistance. Kinnie FeilL. Ducatte, RN, BSN

## 2017-07-02 NOTE — Addendum Note (Signed)
Addended by: Delman CheadleFLETCHER, Tea Collums M on: 07/02/2017 07:27 AM   Modules accepted: Orders

## 2017-07-15 ENCOUNTER — Other Ambulatory Visit: Payer: Self-pay | Admitting: Family Medicine

## 2017-07-15 NOTE — Telephone Encounter (Signed)
Pt needs refill on his hydrocodone.  He has been getting a Rx for one week at a time because he is waiting to get into a pain mgt program. Please advise

## 2017-07-18 ENCOUNTER — Other Ambulatory Visit: Payer: Self-pay | Admitting: Family Medicine

## 2017-07-18 MED ORDER — HYDROCODONE-ACETAMINOPHEN 10-325 MG PO TABS: 1.0000 | ORAL_TABLET | ORAL | 0 refills | Status: DC | PRN

## 2017-07-18 NOTE — Addendum Note (Signed)
Addended by: Delman CheadleFLETCHER, Lymon Kidney M on: 07/18/2017 01:47 PM   Modules accepted: Orders

## 2017-07-18 NOTE — Telephone Encounter (Signed)
Pt called and would like a refill on his pain medication. I talked with him because the pain clinic contacted me and said that it will be 4-6 weeks before they can get him in. Can we supply one month of his pain medication since it will be March before they can see him. Myriam Jacobsonjw

## 2017-07-18 NOTE — Telephone Encounter (Signed)
Yes that will be ok. Please let him know that I will have his supply ready for pickup in the late afternoon.  Myrene BuddyJacob Whittany Parish MD PGY-1 Family Medicine Resident

## 2017-08-03 ENCOUNTER — Other Ambulatory Visit: Payer: Self-pay | Admitting: *Deleted

## 2017-08-03 MED ORDER — INSULIN LISPRO 100 UNIT/ML ~~LOC~~ SOLN: 20.0000 [IU] | Freq: Three times a day (TID) | SUBCUTANEOUS | 5 refills | Status: DC

## 2017-08-08 ENCOUNTER — Telehealth: Payer: Self-pay

## 2017-08-08 NOTE — Telephone Encounter (Signed)
Patient left message on nurse line. He has been working with Annice PihJackie on a referral to pain management. Has not received an appt yet and Annice PihJackie is out for several days. Requests a refill on Vicodin. Ples SpecterAlisa Charlottie Peragine, RN [facility] RN)

## 2017-08-10 MED ORDER — HYDROCODONE-ACETAMINOPHEN 10-325 MG PO TABS: 1.0000 | ORAL_TABLET | ORAL | 0 refills | Status: DC | PRN

## 2017-08-10 NOTE — Addendum Note (Signed)
Addended by: Delman CheadleFLETCHER, Tarra Pence M on: 08/10/2017 01:30 PM   Modules accepted: Orders

## 2017-08-10 NOTE — Telephone Encounter (Signed)
I will place a refill script for his vicodin up front under the "B" tab. Please let the patient know this will be available for pickup in the early afternoon today.  Myrene BuddyJacob Teffany Blaszczyk MD PGY-1 Family Medicine Resident

## 2017-08-10 NOTE — Telephone Encounter (Signed)
Attempted to call pt, no answer. Will try again later. Reata Petrov, CMA  

## 2017-08-11 NOTE — Telephone Encounter (Signed)
Pt already picked up scripts. Deseree Bruna PotterBlount, CMA

## 2017-08-23 ENCOUNTER — Other Ambulatory Visit: Payer: Self-pay

## 2017-08-23 DIAGNOSIS — M25512 Pain in left shoulder: Principal | ICD-10-CM

## 2017-08-23 DIAGNOSIS — M25511 Pain in right shoulder: Secondary | ICD-10-CM

## 2017-08-23 NOTE — Telephone Encounter (Signed)
Pt called stating the pharmacy told him we denied his Rx for lidocaine. I see nowhere in the chart where we even received a refill request. Can you please send a refill to [facility] [address]? Pt call back [phone] Shawna OrleansMeredith B Thomsen, RN

## 2017-08-24 MED ORDER — LIDOCAINE 5 % EX OINT: 1.0000 "application " | TOPICAL_OINTMENT | CUTANEOUS | 5 refills | Status: DC | PRN

## 2017-09-08 ENCOUNTER — Other Ambulatory Visit: Payer: Self-pay | Admitting: Family Medicine

## 2017-09-08 ENCOUNTER — Telehealth: Payer: Self-pay | Admitting: Family Medicine

## 2017-09-08 MED ORDER — HYDROCODONE-ACETAMINOPHEN 10-325 MG PO TABS: 1.0000 | ORAL_TABLET | ORAL | 0 refills | Status: DC | PRN

## 2017-09-08 NOTE — Telephone Encounter (Signed)
Dr. Frances FurbishWinfrey I see that you are covering for Dr. Primitivo GauzeFletcher so this is why I am sending this to you. Pt is calling and requesting a refill on his pain medication. He has finally has an appointment [facility] for 10/11/2017. This is the soonest he can get in. Can we refill his medication one more time to hold him over. He has been taking OTC medication and been without the pain medication for a few days now. This would be the last time again his appointment is set. I Annice Pih(jackie ) have verifed this also know that this is has been a very long road getting him with pain management.Please call him or send me Annice Pih(jackie ) a message and I can call him. If you can or cannot refill his pain medication one last time. jw

## 2017-09-08 NOTE — Telephone Encounter (Signed)
Since [name] has an appointment with pain management for next month, I will refill his pain medication to get him to that appointment.  I just sent in the prescription to his pharmacy.  I'd appreciate it if you would call him to let him know that it's been sent.  Thank you for your help!

## 2017-09-12 DIAGNOSIS — G4733 Obstructive sleep apnea (adult) (pediatric): Secondary | ICD-10-CM | POA: Diagnosis not present

## 2017-09-22 ENCOUNTER — Other Ambulatory Visit: Payer: Self-pay

## 2017-09-22 ENCOUNTER — Ambulatory Visit (INDEPENDENT_AMBULATORY_CARE_PROVIDER_SITE_OTHER): Payer: Medicare Other | Admitting: Internal Medicine

## 2017-09-22 ENCOUNTER — Encounter: Payer: Self-pay | Admitting: Internal Medicine

## 2017-09-22 VITALS — BP 138/80 | HR 88 | Temp 98.5°F | Wt 282.0 lb

## 2017-09-22 DIAGNOSIS — E1142 Type 2 diabetes mellitus with diabetic polyneuropathy: Secondary | ICD-10-CM

## 2017-09-22 DIAGNOSIS — Z794 Long term (current) use of insulin: Secondary | ICD-10-CM | POA: Diagnosis not present

## 2017-09-22 LAB — POCT GLYCOSYLATED HEMOGLOBIN (HGB A1C): HEMOGLOBIN A1C: 10.1

## 2017-09-22 MED ORDER — INSULIN GLARGINE 100 UNIT/ML ~~LOC~~ SOLN: SUBCUTANEOUS | 5 refills | Status: DC

## 2017-09-22 NOTE — Progress Notes (Signed)
[facility] Progress Note  Subjective:  [name] is a 52 y.o. male with history of T2DM, OSA, peripheral neuropathy, PVD of lower extremities with swelling who presents for follow-up of diabetes.  Patient concerned because he has been noticing trend of increased blood sugars despite not changing eating habits. He started testosterone therapy earlier this year and saw sugars in the 400s, so he stopped testosterone. However, fasting sugars have still been typically in the 200s over the last 6 weeks. He reports limiting carbohydrates (no rice, potatoes, sweet tea or soda). He does report eating fruit and putting raw honey or molasses in green tea or coffee. If he has fast food, he says he'll have a grilled chicken sandwich. If he goes to the [employer], he eats just meat and vegetables. He says he has been eating this way for a while and does not understand why sugars would be higher. He reports having low-carb snacks like celery and blue cheese dressing. He does not exercise due to leg pain and neuropathy--uses cane but says he thinks he could walk with his son at the park. He is taking 36 U lantus and 16 units of humalog TID after meals (though recent prescription for 20 U TID prior to meals). He denies fever or skin changes. Only medication changes he can recall is stopping hctz around December for improved LE edema and stopping testosterone when he saw higher blood sugars. Has not seen eye doctor recently; would like to see [facility] provider to have access to discounted glasses. ROS: No increased frequency of urination ("pees a lot" at baseline), no abdominal pain  Allergies  Allergen Reactions  . Penicillins Anaphylaxis  . Metformin And Related Diarrhea and Nausea Only  . Statins Other (See Comments)    Severe systemic side effects (GI, body aches)    Social History   Tobacco Use  . Smoking status: Former Smoker    Packs/day: 1.50    Years: 32.00    Pack years: 48.00     Types: Cigarettes    Last attempt to quit: 01/30/2012    Years since quitting: 5.6  . Smokeless tobacco: Former Neurosurgeon    Types: Snuff    Quit date: 08/01/2011  Substance Use Topics  . Alcohol use: Yes    Comment: occasional    Objective: Blood pressure 138/80, pulse 88, temperature 98.5 F (36.9 C), temperature source Oral, weight 282 lb (127.9 kg), SpO2 97 %. Body mass index is 44.17 kg/m. (wt stable) Constitutional: Obese male, pleasant, in NAD, cane a side Cardiovascular: RRR, S1, S2, no m/r/g.  Pulmonary/Chest: Effort normal and breath sounds normal.  Musculoskeletal: No LE edema Skin: Venous stasis changes of lower legs. No wounds noted.  Psychiatric: Normal mood and affect.  Vitals reviewed  Assessment/Plan: Type 2 diabetes mellitus with neurologic complication - A1c worsened today at 10.1 > 8.2 three months ago and > 7.2 about 10 months ago. 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 - Recommended increasing lantus up from 36 units daily by 1 unit every day that fasting blood sugar is above 150. Stop at 50 units to be re-evaluated. - Recommended keeping a food diary for at least a few days, including snacks and drinks as well - Recommended diabetic retinopathy evaluation (patient may need referral--he will let us know)  Follow-up in a couple weeks to review blood sugar log.  Dani Gobble, MD  [facility], PGY-3

## 2017-09-22 NOTE — Patient Instructions (Signed)
[name],  Please increase lantus up from 36 units daily by 1 unit every day that your fasting blood sugar is about 150. Stop at 50 units.  Please also keep a food diary for at least a few days, including snacks and drinks as well.  Return in 2 weeks to discuss blood sugar log.  Tell the eye doctor you need to be checked for diabetic retinopathy.  Best, Dr. Sampson GoonFitzgerald

## 2017-09-23 NOTE — Assessment & Plan Note (Signed)
-   A1c worsened today at 10.1 > 8.2 three months ago and > 7.2 about 10 months ago. 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 - Recommended increasing lantus up from 36 units daily by 1 unit every day that fasting blood sugar is above 150. Stop at 50 units to be re-evaluated. - Recommended keeping a food diary for at least a few days, including snacks and drinks as well - Recommended diabetic retinopathy evaluation (patient may need referral--he will let us know)

## 2017-10-11 ENCOUNTER — Encounter: Payer: Self-pay | Admitting: Student in an Organized Health Care Education/Training Program

## 2017-10-11 ENCOUNTER — Ambulatory Visit
Payer: Medicare Other | Attending: Student in an Organized Health Care Education/Training Program | Admitting: Student in an Organized Health Care Education/Training Program

## 2017-10-11 ENCOUNTER — Other Ambulatory Visit: Payer: Self-pay

## 2017-10-11 VITALS — BP 155/98 | HR 79 | Temp 98.5°F | Resp 16 | Ht 69.0 in | Wt 279.0 lb

## 2017-10-11 DIAGNOSIS — Z87891 Personal history of nicotine dependence: Secondary | ICD-10-CM | POA: Insufficient documentation

## 2017-10-11 DIAGNOSIS — M5136 Other intervertebral disc degeneration, lumbar region: Secondary | ICD-10-CM | POA: Diagnosis not present

## 2017-10-11 DIAGNOSIS — M25511 Pain in right shoulder: Secondary | ICD-10-CM | POA: Diagnosis not present

## 2017-10-11 DIAGNOSIS — M79606 Pain in leg, unspecified: Secondary | ICD-10-CM | POA: Diagnosis not present

## 2017-10-11 DIAGNOSIS — N529 Male erectile dysfunction, unspecified: Secondary | ICD-10-CM | POA: Insufficient documentation

## 2017-10-11 DIAGNOSIS — M549 Dorsalgia, unspecified: Secondary | ICD-10-CM | POA: Diagnosis not present

## 2017-10-11 DIAGNOSIS — M5417 Radiculopathy, lumbosacral region: Secondary | ICD-10-CM

## 2017-10-11 DIAGNOSIS — E1142 Type 2 diabetes mellitus with diabetic polyneuropathy: Secondary | ICD-10-CM

## 2017-10-11 DIAGNOSIS — Z7982 Long term (current) use of aspirin: Secondary | ICD-10-CM | POA: Insufficient documentation

## 2017-10-11 DIAGNOSIS — G894 Chronic pain syndrome: Secondary | ICD-10-CM

## 2017-10-11 DIAGNOSIS — M25512 Pain in left shoulder: Secondary | ICD-10-CM | POA: Insufficient documentation

## 2017-10-11 DIAGNOSIS — M5416 Radiculopathy, lumbar region: Secondary | ICD-10-CM

## 2017-10-11 DIAGNOSIS — Z79899 Other long term (current) drug therapy: Secondary | ICD-10-CM | POA: Insufficient documentation

## 2017-10-11 DIAGNOSIS — G4733 Obstructive sleep apnea (adult) (pediatric): Secondary | ICD-10-CM | POA: Diagnosis not present

## 2017-10-11 DIAGNOSIS — I10 Essential (primary) hypertension: Secondary | ICD-10-CM | POA: Insufficient documentation

## 2017-10-11 DIAGNOSIS — Z9889 Other specified postprocedural states: Secondary | ICD-10-CM | POA: Insufficient documentation

## 2017-10-11 DIAGNOSIS — M47816 Spondylosis without myelopathy or radiculopathy, lumbar region: Secondary | ICD-10-CM | POA: Diagnosis not present

## 2017-10-11 DIAGNOSIS — E119 Type 2 diabetes mellitus without complications: Secondary | ICD-10-CM | POA: Diagnosis not present

## 2017-10-11 DIAGNOSIS — Z794 Long term (current) use of insulin: Secondary | ICD-10-CM | POA: Insufficient documentation

## 2017-10-11 MED ORDER — DULOXETINE HCL 30 MG PO CPEP: 30.0000 mg | ORAL_CAPSULE | Freq: Every day | ORAL | 0 refills | Status: DC

## 2017-10-11 NOTE — Progress Notes (Signed)
Safety precautions to be maintained throughout the outpatient stay will include: orient to surroundings, keep bed in low position, maintain call bell within reach at all times, provide assistance with transfer out of bed and ambulation.  

## 2017-10-11 NOTE — Patient Instructions (Signed)
You were given one prescription for Cymbalta,

## 2017-10-11 NOTE — Progress Notes (Signed)
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 previous visit. Psychological-Psychiatric: Anxiousness and Depressed Gastrointestinal: No reported gastrointestinal signs or symptoms such as vomiting or evacuating blood, reflux, heartburn, alternating episodes of diarrhea and constipation, inflamed or scarred liver, or pancreas or irrregular and/or infrequent bowel movements Genitourinary: No reported renal or genitourinary signs or symptoms such as difficulty voiding or producing urine, peeing blood, non-functioning kidney, kidney stones, difficulty emptying the bladder, difficulty controlling the flow of urine, or chronic kidney disease Hematological: No reported hematological signs or symptoms such as prolonged bleeding, low or poor functioning platelets, bruising or bleeding easily, hereditary bleeding problems, low energy levels due to low hemoglobin or being anemic Endocrine: High blood sugar requiring insulin (IDDM) and High blood sugar  controlled without the use of insulin (NIDDM) Rheumatologic: No reported rheumatological signs and symptoms such as fatigue, joint pain, tenderness, swelling, redness, heat, stiffness, decreased range of motion, with or without associated rash Musculoskeletal: Negative for myasthenia gravis, muscular dystrophy, multiple sclerosis or malignant hyperthermia Work History: Disabled due to many things  Allergies  [name] is allergic to penicillins; metformin and related; and statins.  Laboratory Chemistry  Inflammation Markers (CRP:  Acute Phase) (ESR: Chronic Phase) Lab Results  Component Value Date   ESRSEDRATE 82 (H) 04/09/2008                         Rheumatology Markers Lab Results  Component Value Date   RF < 20 04/04/2008   LABURIC 5.7 04/04/2008                        Renal Function Markers Lab Results  Component Value Date   BUN 15 09/20/2016   CREATININE 0.92 09/20/2016   GFRAA 113 09/20/2016   GFRNONAA 98 09/20/2016                              Hepatic Function Markers Lab Results  Component Value Date   AST 22 03/25/2015   ALT 25 03/25/2015   ALBUMIN 4.1 03/25/2015   ALKPHOS 65 03/25/2015   HCVAB NEG 04/02/2008   LIPASE 24 03/25/2015                        Electrolytes Lab Results  Component Value Date   NA 140 09/20/2016   K 4.7 09/20/2016   CL 99 09/20/2016   CALCIUM 9.9 09/20/2016   MG 2.0 09/20/2016                        Neuropathy Markers Lab Results  Component Value Date   VITAMINB12 1199 (H) 06/17/2009   FOLATE >20.0 ng/mL 06/17/2009   HGBA1C 10.1 09/22/2017                        Bone Pathology Markers Lab Results  Component Value Date   TESTOSTERONE 174 (L) 05/16/2017                         Coagulation Parameters Lab Results  Component Value Date   PLT 224 05/16/2017   DDIMER (H) 04/04/2008    0.54        AT THE INHOUSE ESTABLISHED CUTOFF VALUE OF 0.48 ug/mL FEU, THIS ASSAY HAS BEEN DOCUMENTED IN THE LITERATURE TO HAVE                         Cardiovascular Markers Lab Results  Component Value Date   CKTOTAL 106 04/04/2008   CKMB 4.1 (H) 04/04/2008   TROPONINI 0.01        NO INDICATION OF MYOCARDIAL INJURY. 04/04/2008   HGB 14.5 05/16/2017   HCT 42.0 05/16/2017                         CA Markers No results found for: CEA, CA125, LABCA2                      Note: Lab results reviewed.  PFSH  Drug: [name]  reports that he does not use drugs. Alcohol:  reports that he drinks alcohol. Tobacco:  reports that he quit smoking about 5 years ago. His smoking use included cigarettes. He has a 48.00 pack-year smoking history. He quit smokeless tobacco use about 6 years ago. His smokeless tobacco use included snuff. Medical:  has a past medical history of Arthritis, Bilateral edema of lower extremity, Bulge of cervical disc without  myelopathy, Chronic low back pain, Disorder of subcutaneous tissue, Environmental allergies, History of cervical fracture, History of multiple concussions, History of pneumothorax, History of seizures (pt states takes gabapentin to control seizures and for neuropathy---  followed by PCP  dr  Lorenso Courier ([facility])  seizures are note mentioned as hx in her last note or previous notes), Hypertension, OSA on CPAP, Peripheral neuropathy, Recurrent boils, Type 2 diabetes mellitus ([place]), and Wears glasses. Family: family history includes Asthma in his maternal aunt, maternal grandfather, maternal grandmother, maternal uncle, and mother; COPD in his mother; Diabetes in his maternal aunt, maternal grandfather, maternal grandmother, maternal uncle, and mother; Obesity in his maternal aunt, maternal grandfather, maternal grandmother, maternal uncle, and mother.  Past Surgical History:  Procedure Laterality Date  . APPENDECTOMY  age 9  . I & D RIGHT INDEX FINGER PURULENT FLEXOR TENOSYNOVITIS  08-23-2001  . LUMBAR DISC SURGERY  02-08-2002   L4 - 5  . MASS EXCISION N/A 08/07/2015    Procedure: EXCISION OF LOWER BACK TISSUE/ PILONIDAL DISEASE.;  Surgeon: Michael Boston, MD;  Location: [place];  Service: General;  Laterality: N/A;  . REATTACHMENT LEFT INDEX FINGER  1999  . TRANSTHORACIC ECHOCARDIOGRAM  05-03-2007   normal LV, ef 55-65%   Active Ambulatory Problems    Diagnosis Date Noted  . HYPERTRIGLYCERIDEMIA, SEVERE 04/27/2007  . OBESITY, NOS 08/11/2006  . HYPERTENSION, BENIGN SYSTEMIC 08/11/2006  . Recurrent boils 08/13/2009  . Pain in joint 04/04/2008  . Lumbosacral neuritis 08/11/2006  . DISTURBANCE OF SKIN SENSATION 06/17/2009  . LEG EDEMA, BILATERAL 03/10/2007  . TACHYCARDIA 01/30/2007  . TRANSAMINASES, SERUM, ELEVATED 04/02/2008  . Venous stasis 11/02/2011  . Heel callus 12/11/2011  . Erectile dysfunction 12/11/2011  . OSA (obstructive sleep apnea) 01/31/2012  . Type 2 diabetes mellitus with neurologic complication ([place]) 50/93/2671  . Skin tag 04/25/2012  . Seborrheic dermatitis of scalp 01/23/2013  . Onychomycosis 01/23/2013  . Bilateral shoulder pain 01/23/2013  . Tobacco dependence in remission 04/10/2013  . Other malaise and fatigue 09/03/2013  . Difficulty concentrating 09/03/2013  . Weight loss counseling, encounter for 12/20/2013  . Flank pain, acute 03/25/2015  . Pilonidal disease 06/03/2015  . Chest pain 09/20/2016   Resolved Ambulatory Problems    Diagnosis Date Noted  . TOBACCO DEPENDENCE 08/11/2006  . OTITIS EXTERNA, ACUTE 01/30/2007  . PRURITUS 04/01/2008  . FREQUENCY, URINARY 08/11/2006  . ANAPHYLACTIC REACTION 04/01/2008  . Left shoulder pain 08/12/2010  . Bronchitis 11/26/2010  . Tinea pedis 11/02/2011  . Fasting hyperglycemia 01/31/2012  . Hypercarbia 01/31/2012  . Altered mental status 01/31/2012  . Tinea pedis 07/09/2012   Past Medical History:  Diagnosis Date  . Arthritis   . Bilateral edema of lower extremity   . Bulge of cervical disc without myelopathy   . Chronic low back pain   . Disorder of  subcutaneous tissue   . Environmental allergies   . History of cervical fracture   . History of multiple concussions   . History of pneumothorax   . History of seizures pt states takes gabapentin to control seizures and for neuropathy---  followed by PCP  dr  Lorenso Courier ([facility])  seizures are note mentioned as hx in her last note or previous notes  . Hypertension   . OSA on CPAP   . Peripheral neuropathy   . Recurrent boils   . Type 2 diabetes mellitus ([place])   . Wears glasses    Constitutional Exam  General appearance:  alert, cooperative and morbidly obese Vitals:   10/11/17 0812  BP: (!) 155/98  Pulse: 79  Resp: 16  Temp: 98.5 F (36.9 C)  SpO2: 98%  Weight: 279 lb (126.6 kg)  Height: _0  (1.753 m)   BMI Assessment: Estimated body mass index is 41.2 kg/m as calculated from the following:   Height as of this encounter: _1  (1.753 m).   Weight as of this encounter: 279 lb (126.6 kg).  BMI interpretation table: BMI level Category Range association with higher incidence of chronic pain  <18 kg/m2 Underweight   18.5-24.9 kg/m2 Ideal body weight   25-29.9 kg/m2 Overweight Increased incidence by 20%  30-34.9 kg/m2 Obese (Class I) Increased incidence by 68%  35-39.9 kg/m2 Severe obesity (Class II) Increased incidence by 136%  >40 kg/m2 Extreme obesity (Class III) Increased incidence by 254%   Patient's current BMI Ideal Body weight  Body mass index is 41.2 kg/m. Ideal body weight: 70.7 kg (155 lb 13.8 oz) Adjusted ideal body weight: 93 kg (205 lb 1.9 oz)   BMI Readings from Last 4 Encounters:  10/11/17 41.20 kg/m  09/22/17 44.17 kg/m  06/03/17 44.01 kg/m  04/28/17 44.48 kg/m   Wt Readings from Last 4 Encounters:  10/11/17 279 lb (126.6 kg)  09/22/17 282 lb (127.9 kg)  06/03/17 281 lb (127.5 kg)  04/28/17 284 lb (128.8 kg)  Psych/Mental status: Alert, oriented x 3 (person, place, & time)       Eyes: PERLA Respiratory: No evidence of acute respiratory  distress  Cervical Spine Area Exam  Skin & Axial Inspection: No masses, redness, edema, swelling, or associated skin lesions Alignment: Symmetrical Functional ROM: Decreased ROM      Stability: No instability detected Muscle Tone/Strength: Functionally intact. No obvious neuro-muscular anomalies detected. Sensory (Neurological): Neuropathic pain pattern Palpation: Complains of area being tender to palpation              Upper Extremity (UE) Exam    Side: Right upper extremity  Side: Left upper extremity  Skin & Extremity Inspection: Skin color, temperature, and hair growth are WNL. No peripheral edema or cyanosis. No masses, redness, swelling, asymmetry, or associated skin lesions. No contractures.  Skin & Extremity Inspection: Skin color, temperature, and hair growth are WNL. No peripheral edema or cyanosis. No masses, redness, swelling, asymmetry, or associated skin lesions. No contractures.  Functional ROM: Unrestricted ROM          Functional ROM: Unrestricted ROM          Muscle Tone/Strength: Functionally intact. No obvious neuro-muscular anomalies detected.  Muscle Tone/Strength: Functionally intact. No obvious neuro-muscular anomalies detected.  Sensory (Neurological): Unimpaired          Sensory (Neurological): Unimpaired          Palpation: No palpable anomalies              Palpation: No palpable anomalies              Specialized Test(s): Deferred         Specialized Test(s): Deferred          Thoracic Spine Area Exam  Skin & Axial Inspection: No masses, redness, or swelling Alignment: Symmetrical Functional ROM: Unrestricted ROM Stability: No instability detected Muscle Tone/Strength: Functionally intact. No obvious neuro-muscular anomalies detected. Sensory (Neurological): Unimpaired Muscle strength & Tone: No palpable anomalies  Lumbar Spine Area Exam  Skin & Axial Inspection: Well healed scar from previous spine surgery detected Alignment: Symmetrical Functional ROM:  Decreased ROM affecting both sides Stability: No instability detected Muscle Tone/Strength: Functionally intact. No obvious neuro-muscular anomalies detected. Sensory (Neurological): Dermatomal pain pattern Palpation: Complains of area being tender to palpation Bilateral Fist Percussion Test Provocative Tests: Lumbar Hyperextension and rotation test: Positive bilaterally for facet joint pain. Lumbar Lateral bending test: Positive ipsilateral radicular pain, bilaterally. Positive for bilateral foraminal stenosis. Patrick's Maneuver: evaluation deferred today                    Gait & Posture Assessment  Ambulation: Patient ambulates using a cane Gait: Antalgic Posture: Difficulty standing up straight, due to pain   Lower Extremity Exam    Side: Right lower extremity  Side: Left lower extremity  Skin & Extremity Inspection: Skin color, temperature, and hair growth are WNL. No peripheral edema or cyanosis. No masses, redness, swelling, asymmetry, or associated skin lesions. No contractures.  Skin & Extremity Inspection: Skin color, temperature, and hair growth are WNL. No peripheral edema or cyanosis. No masses, redness, swelling, asymmetry, or associated skin lesions. No contractures.  Functional ROM: Unrestricted ROM          Functional ROM: Decreased ROM          Muscle Tone/Strength: Functionally intact. No obvious neuro-muscular anomalies detected.  Muscle Tone/Strength: Functionally intact. No obvious neuro-muscular anomalies detected.  Sensory (Neurological): Unimpaired  Sensory (Neurological): Neuropathic pain pattern  Palpation: No palpable anomalies  Palpation: Tender   Assessment  Primary Diagnosis & Pertinent Problem List: The primary encounter diagnosis was History of lumbar laminectomy (left L4/5). Diagnoses of Lumbar radiculopathy, Lumbar degenerative disc disease, OSA (obstructive sleep apnea), Lumbosacral neuritis, Lumbar spondylosis, Diabetic polyneuropathy associated with  type 2 diabetes mellitus ([place]), and Chronic pain syndrome were also pertinent to this visit.  Visit Diagnosis (New problems to examiner): 1. History of lumbar laminectomy (left L4/5)   2. Lumbar radiculopathy   3. Lumbar degenerative disc disease   4. OSA (obstructive sleep apnea)   5. Lumbosacral neuritis   6. Lumbar spondylosis   7. Diabetic polyneuropathy associated with type 2 diabetes mellitus ([place])   8. Chronic pain syndrome   General Recommendations: The pain condition that the patient suffers from is best treated with a multidisciplinary approach that involves an increase in physical activity to prevent de-conditioning and worsening of the pain cycle, as well as psychological counseling (formal and/or informal) to address the co-morbid psychological affects of pain. Treatment will often involve judicious use of pain medications and interventional procedures to decrease the pain, allowing the patient to participate in the physical activity that will ultimately produce long-lasting pain reductions. The goal of the multidisciplinary approach is to return the patient to a higher level of overall function and to restore their ability to perform activities of daily living.  52 year old male with a history of chronic axial low back pain that radiates down into his left leg status post L4-L5 laminectomy as well as lumbar cyst removal who has been on chronic opioid therapy for greater than 10 years who presents primarily for medication management.  Patient has tried various medications including NSAIDs such as ibuprofen, naproxen, Mobic along with neuropathic agent such as Lyrica, gabapentin which she did not find effective and resulted in mental status changes.  Patient wants to avoid chronic NSAID therapy given his type 2 diabetes, on insulin.  Patient has lower extreme neuropathy, left greater than right secondary to diabetic polyneuropathy.  Patient has been on chronic opioid therapy for greater  than 20 years, PMP  dating back to 2013.  His chronic opioid regimen consist of hydrocodone 10 mg 4 times daily as needed, quantity 44-month MME equals 40.  Regards to previous interventional therapies, patient has tried epidural steroid injections which have been moderately effective for his radicular symptoms.  Patient is also underwent a spinal cord stimulator trial with [employer] in October 2018 which was not effective for his lower extremity symptoms.  Patient also has obstructive sleep apnea.  I had an extensive discussion with the patient about clinical practice as well as clinic policy.  Patient was very honest and forthright about his prior chronic pain history.  Will obtain a urine drug screen today which should be positive for hydrocodone and its metabolites.  We also discussed starting Cymbalta at 30 mg to help with his chronic neuropathic pain since the patient has tried and failed Lyrica and gabapentin.  I also notify the patient that we would not escalate beyond his dose of hydrocodone 10 mg 4 times daily as needed, quantity 115-monthhich she has been on over the last 7 years.  Also since the patient has found epidural steroid injections effective, we can consider PRN ESI's when he has acute on chronic radicular pain flares.  Patient is a type II diabetic on chronic insulin therapy so we will be mindful of total cumulative steroid exposure.  Plan: -UDS today.  Should be positive for hydrocodone and its metabolites. -Start Cymbalta at 30 mg daily. -[facility] -Consider monthly to q2 month visits  Ordered Lab-work, Procedure(s), Referral(s), & Consult(s): Orders Placed This Encounter  Procedures  . Compliance Drug Analysis, Ur   Pharmacotherapy (current): Medications ordered:  Meds ordered this encounter  Medications  . DULoxetine (CYMBALTA) 30 MG capsule    Sig: Take 1 capsule (30 mg total) by mouth daily.     Dispense:  30 capsule    Refill:  0   Medications administered during this visit: BrJavarious. BeFortinad no medications administered during this visit.   Pharmacological management options:  Opioid Analgesics: The patient was informed that there is no guarantee that he would be a candidate for opioid analgesics. The decision will be made following CDC guidelines. This decision will be based on the results of diagnostic studies, as well as [name] profile.   Membrane stabilizer: Tried and failed (Gabapentin and Lyrica), will trial Cymbalta  Muscle relaxant: To be determined at a later time however has tried Flexeril, baclofen which made him sedated  NSAID: To be determined at a later time however should avoid given his diabetic polyneuropathy  Other analgesic(s): To be determined at a later time   Interventional management options: [name] informed that there is no guarantee that he would be a candidate for interventional therapies. The decision will be based on the results of diagnostic studies, as well as Mr. BeCostlowisk profile.  Procedure(s) under consideration:  Lumbar epidural steroid injection PRN   Provider-requested follow-up: Return in about 2 weeks (around 10/25/2017) for Medication Management.  No future appointments.  Primary Care Physician: FlGuadalupe DawnMD [facility] Note by: BiGillis SantaM.D, Date: 10/11/2017; Time: 8:50 AM  There are no Patient Instructions on file for this visit.

## 2017-10-15 LAB — COMPLIANCE DRUG ANALYSIS, UR

## 2017-10-18 ENCOUNTER — Other Ambulatory Visit: Payer: Self-pay | Admitting: Student in an Organized Health Care Education/Training Program

## 2017-10-18 DIAGNOSIS — G894 Chronic pain syndrome: Secondary | ICD-10-CM

## 2017-10-18 NOTE — Progress Notes (Signed)
Patient's previous urine drug screen was dilute.  Urine creatinine less than 20.  Called patient to discuss this.  Will repeat UDS.  Patient will come in to provide urine sample (next 1-2 days).  Next appointment with me is on 10/25/2017.  Orders Placed This Encounter  Procedures  . Compliance Drug Analysis, Ur    Volume: 30 ml(s). Minimum 3 ml of urine is needed. Document temperature of fresh sample. Indications: Long term (current) use of opiate analgesic (Z79.891) Test#: [id number] (Comprehensive Profile)    Standing Status:   Future    Standing Expiration Date:   10/19/2018

## 2017-10-20 ENCOUNTER — Other Ambulatory Visit: Payer: Self-pay | Admitting: Student in an Organized Health Care Education/Training Program

## 2017-10-20 ENCOUNTER — Other Ambulatory Visit: Payer: Self-pay

## 2017-10-20 DIAGNOSIS — G894 Chronic pain syndrome: Secondary | ICD-10-CM

## 2017-10-25 ENCOUNTER — Other Ambulatory Visit: Payer: Self-pay

## 2017-10-25 ENCOUNTER — Encounter: Payer: Self-pay | Admitting: Student in an Organized Health Care Education/Training Program

## 2017-10-25 ENCOUNTER — Ambulatory Visit
Payer: Medicare Other | Attending: Student in an Organized Health Care Education/Training Program | Admitting: Student in an Organized Health Care Education/Training Program

## 2017-10-25 VITALS — BP 136/94 | HR 79 | Temp 98.5°F | Resp 16 | Ht 69.0 in | Wt 279.0 lb

## 2017-10-25 DIAGNOSIS — M47896 Other spondylosis, lumbar region: Secondary | ICD-10-CM | POA: Diagnosis not present

## 2017-10-25 DIAGNOSIS — Z87891 Personal history of nicotine dependence: Secondary | ICD-10-CM | POA: Diagnosis not present

## 2017-10-25 DIAGNOSIS — M5416 Radiculopathy, lumbar region: Secondary | ICD-10-CM | POA: Diagnosis not present

## 2017-10-25 DIAGNOSIS — M79604 Pain in right leg: Secondary | ICD-10-CM | POA: Insufficient documentation

## 2017-10-25 DIAGNOSIS — M5417 Radiculopathy, lumbosacral region: Secondary | ICD-10-CM | POA: Diagnosis not present

## 2017-10-25 DIAGNOSIS — G4733 Obstructive sleep apnea (adult) (pediatric): Secondary | ICD-10-CM | POA: Diagnosis not present

## 2017-10-25 DIAGNOSIS — I10 Essential (primary) hypertension: Secondary | ICD-10-CM | POA: Insufficient documentation

## 2017-10-25 DIAGNOSIS — Z7982 Long term (current) use of aspirin: Secondary | ICD-10-CM | POA: Insufficient documentation

## 2017-10-25 DIAGNOSIS — Z9889 Other specified postprocedural states: Secondary | ICD-10-CM

## 2017-10-25 DIAGNOSIS — E1149 Type 2 diabetes mellitus with other diabetic neurological complication: Secondary | ICD-10-CM | POA: Insufficient documentation

## 2017-10-25 DIAGNOSIS — G894 Chronic pain syndrome: Secondary | ICD-10-CM

## 2017-10-25 DIAGNOSIS — R600 Localized edema: Secondary | ICD-10-CM | POA: Diagnosis not present

## 2017-10-25 DIAGNOSIS — E781 Pure hyperglyceridemia: Secondary | ICD-10-CM | POA: Diagnosis not present

## 2017-10-25 DIAGNOSIS — M79605 Pain in left leg: Secondary | ICD-10-CM | POA: Insufficient documentation

## 2017-10-25 DIAGNOSIS — Z794 Long term (current) use of insulin: Secondary | ICD-10-CM | POA: Diagnosis not present

## 2017-10-25 DIAGNOSIS — M5136 Other intervertebral disc degeneration, lumbar region: Secondary | ICD-10-CM | POA: Diagnosis not present

## 2017-10-25 DIAGNOSIS — E1142 Type 2 diabetes mellitus with diabetic polyneuropathy: Secondary | ICD-10-CM | POA: Diagnosis not present

## 2017-10-25 DIAGNOSIS — N529 Male erectile dysfunction, unspecified: Secondary | ICD-10-CM | POA: Diagnosis not present

## 2017-10-25 DIAGNOSIS — Z6841 Body Mass Index (BMI) 40.0 and over, adult: Secondary | ICD-10-CM | POA: Insufficient documentation

## 2017-10-25 DIAGNOSIS — M25512 Pain in left shoulder: Secondary | ICD-10-CM | POA: Diagnosis not present

## 2017-10-25 DIAGNOSIS — M25511 Pain in right shoulder: Secondary | ICD-10-CM | POA: Insufficient documentation

## 2017-10-25 DIAGNOSIS — B351 Tinea unguium: Secondary | ICD-10-CM | POA: Diagnosis not present

## 2017-10-25 DIAGNOSIS — R209 Unspecified disturbances of skin sensation: Secondary | ICD-10-CM | POA: Diagnosis not present

## 2017-10-25 DIAGNOSIS — L218 Other seborrheic dermatitis: Secondary | ICD-10-CM | POA: Insufficient documentation

## 2017-10-25 DIAGNOSIS — Z888 Allergy status to other drugs, medicaments and biological substances status: Secondary | ICD-10-CM | POA: Insufficient documentation

## 2017-10-25 DIAGNOSIS — Z880 Allergy status to penicillin: Secondary | ICD-10-CM | POA: Insufficient documentation

## 2017-10-25 DIAGNOSIS — M47816 Spondylosis without myelopathy or radiculopathy, lumbar region: Secondary | ICD-10-CM | POA: Diagnosis not present

## 2017-10-25 DIAGNOSIS — Z79899 Other long term (current) drug therapy: Secondary | ICD-10-CM | POA: Insufficient documentation

## 2017-10-25 MED ORDER — HYDROCODONE-ACETAMINOPHEN 10-325 MG PO TABS: 1.0000 | ORAL_TABLET | Freq: Four times a day (QID) | ORAL | 0 refills | Status: DC | PRN

## 2017-10-25 NOTE — Progress Notes (Signed)
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 neuropathy---  followed by PCP  dr  Lorenso Courier ([facility])  seizures are note mentioned as hx in her last note or previous notes), Hypertension, OSA on CPAP, Peripheral neuropathy, Recurrent boils, Type 2 diabetes mellitus ([place]), and Wears glasses. Surgical: Mr. Rout  has a past surgical history that includes I & D RIGHT INDEX FINGER PURULENT FLEXOR TENOSYNOVITIS (08-23-2001); Lumbar disc surgery (02-08-2002); transthoracic echocardiogram (05-03-2007); REATTACHMENT LEFT INDEX FINGER (1999); Appendectomy (age 27); and Mass excision (N/A, 08/07/2015). Family: family history includes Asthma in his maternal aunt, maternal grandfather, maternal grandmother, maternal uncle, and mother; COPD in his mother; Diabetes in his maternal aunt, maternal grandfather, maternal grandmother,  maternal uncle, and mother; Obesity in his maternal aunt, maternal grandfather, maternal grandmother, maternal uncle, and mother.  Constitutional Exam  General appearance: Well nourished, well developed, and well hydrated. In no apparent acute distress Vitals:   10/25/17 0843  BP: (!) 136/94  Pulse: 79  Resp: 16  Temp: 98.5 F (36.9 C)  TempSrc: Oral  SpO2: 98%  Weight: 279 lb (126.6 kg)  Height: 5' 9"  (1.753 m)   BMI Assessment: Estimated body mass index is 41.2 kg/m as calculated from the following:   Height as of this encounter: 5' 9"  (1.753 m).   Weight as of this encounter: 279 lb (126.6 kg).  BMI interpretation table: BMI level Category Range association with higher incidence of chronic pain  <18 kg/m2 Underweight   18.5-24.9 kg/m2 Ideal body weight   25-29.9 kg/m2 Overweight Increased incidence by 20%  30-34.9 kg/m2 Obese (Class I) Increased incidence by 68%  35-39.9 kg/m2 Severe obesity (Class II) Increased incidence by 136%  >40 kg/m2 Extreme obesity (Class III) Increased incidence by 254%   Patient's current BMI Ideal Body weight  Body mass index is 41.2 kg/m. Ideal body weight: 70.7 kg (155 lb 13.8 oz) Adjusted ideal body weight: 93 kg (205 lb 1.9 oz)   BMI Readings from Last 4 Encounters:  10/25/17 41.20 kg/m  10/11/17 41.20 kg/m  09/22/17 44.17 kg/m  06/03/17 44.01 kg/m   Wt Readings from Last 4 Encounters:  10/25/17 279 lb (126.6 kg)  10/11/17 279 lb (126.6 kg)  09/22/17 282 lb (127.9 kg)  06/03/17 281 lb (127.5 kg)  Psych/Mental status: Alert, oriented x 3 (person, place, & time)       Eyes: PERLA Respiratory: No evidence of acute respiratory distress  Cervical Spine Area Exam  Skin & Axial Inspection: No masses, redness, edema, swelling, or associated skin lesions Alignment: Symmetrical Functional ROM: Unrestricted ROM      Stability: No instability detected Muscle Tone/Strength: Functionally intact. No obvious neuro-muscular anomalies  detected. Sensory (Neurological): Unimpaired Palpation: No palpable anomalies              Upper Extremity (  UE) Exam    Side: Right upper extremity  Side: Left upper extremity  Skin & Extremity Inspection: Skin color, temperature, and hair growth are WNL. No peripheral edema or cyanosis. No masses, redness, swelling, asymmetry, or associated skin lesions. No contractures.  Skin & Extremity Inspection: Skin color, temperature, and hair growth are WNL. No peripheral edema or cyanosis. No masses, redness, swelling, asymmetry, or associated skin lesions. No contractures.  Functional ROM: Unrestricted ROM          Functional ROM: Unrestricted ROM          Muscle Tone/Strength: Functionally intact. No obvious neuro-muscular anomalies detected.  Muscle Tone/Strength: Functionally intact. No obvious neuro-muscular anomalies detected.  Sensory (Neurological): Unimpaired          Sensory (Neurological): Unimpaired          Palpation: No palpable anomalies              Palpation: No palpable anomalies              Specialized Test(s): Deferred         Specialized Test(s): Deferred          Thoracic Spine Area Exam  Skin & Axial Inspection: No masses, redness, or swelling Alignment: Symmetrical Functional ROM: Unrestricted ROM Stability: No instability detected Muscle Tone/Strength: Functionally intact. No obvious neuro-muscular anomalies detected. Sensory (Neurological): Unimpaired Muscle strength & Tone: No palpable anomalies   Lumbar Spine Area Exam  Skin & Axial Inspection: Well healed scar from previous spine surgery detected Alignment: Symmetrical Functional ROM: Decreased ROM affecting both sides Stability: No instability detected Muscle Tone/Strength: Functionally intact. No obvious neuro-muscular anomalies detected. Sensory (Neurological): Dermatomal pain pattern Palpation: Complains of area being tender to palpation Bilateral Fist Percussion Test Provocative Tests: Lumbar  Hyperextension and rotation test: Positive bilaterally for facet joint pain. Lumbar Lateral bending test: Positive ipsilateral radicular pain, bilaterally. Positive for bilateral foraminal stenosis. Patrick's Maneuver: evaluation deferred today                    Gait & Posture Assessment  Ambulation: Patient ambulates using a cane Gait: Antalgic Posture: Difficulty standing up straight, due to pain   Lower Extremity Exam    Side: Right lower extremity  Side: Left lower extremity  Stability: No instability observed          Stability: No instability observed          Skin & Extremity Inspection: Positive color changes  Skin & Extremity Inspection: Positive color changes  Functional ROM: Unrestricted ROM                  Functional ROM: Unrestricted ROM                  Muscle Tone/Strength: Functionally intact. No obvious neuro-muscular anomalies detected.  Muscle Tone/Strength: Functionally intact. No obvious neuro-muscular anomalies detected.  Sensory (Neurological): Unimpaired  Sensory (Neurological): Unimpaired  Palpation: No palpable anomalies  Palpation: No palpable anomalies   Assessment & Plan  Primary Diagnosis & Pertinent Problem List: The primary encounter diagnosis was Chronic pain syndrome. Diagnoses of History of lumbar laminectomy (left L4/5), Lumbar radiculopathy, Lumbar degenerative disc disease, OSA (obstructive sleep apnea), Lumbosacral neuritis, Lumbar spondylosis, and Diabetic polyneuropathy associated with type 2 diabetes mellitus ([place]) were also pertinent to this visit.  Visit Diagnosis: 1. Chronic pain syndrome   2. History of lumbar laminectomy (left L4/5)   3. Lumbar radiculopathy   4. Lumbar degenerative disc disease  5. OSA (obstructive sleep apnea)   6. Lumbosacral neuritis   7. Lumbar spondylosis   8. Diabetic polyneuropathy associated with type 2 diabetes mellitus ([place])    General Recommendations: The pain condition that the patient suffers from is  best treated with a multidisciplinary approach that involves an increase in physical activity to prevent de-conditioning and worsening of the pain cycle, as well as psychological counseling (formal and/or informal) to address the co-morbid psychological affects of pain. Treatment will often involve judicious use of pain medications and interventional procedures to decrease the pain, allowing the patient to participate in the physical activity that will ultimately produce long-lasting pain reductions. The goal of the multidisciplinary approach is to return the patient to a higher level of overall function and to restore their ability to perform activities of daily living.  52 year old male with a history of chronic axial low back pain that radiates down into his left leg status post L4-L5 laminectomy as well as lumbar cyst removal who has been on chronic opioid therapy for greater than 10 years who presents primarily for medication management.  Patient has tried various medications including NSAIDs such as ibuprofen, naproxen, Mobic along with neuropathic agent such as Lyrica, gabapentin which she did not find effective and resulted in mental status changes.  Patient wants to avoid chronic NSAID therapy given his type 2 diabetes, on insulin.  Patient has lower extreme neuropathy, left greater than right secondary to diabetic polyneuropathy.  Patient has been on chronic opioid therapy for greater than 20 years, PMP dating back to 2013.  His chronic opioid regimen consist of hydrocodone 10 mg 4 times daily as needed, quantity 21-month MME equals 40.  Regards to previous interventional therapies, patient has tried epidural steroid injections which have been moderately effective for his radicular symptoms.  Patient is also underwent a spinal cord stimulator trial with [employer] in October 2018 which was not effective for his lower extremity symptoms.  Patient also has obstructive sleep apnea.  Patient  returns today for medication management.  UDS completed.  Urine sample showed low urine creatinine so a repeat urine sample was obtained last week results of which are still pending.  There were no aberrant findings in terms of illicit substances in his UDS.  We will continue his chronic opioid regimen of hydrocodone 10 mg every 6 hours as needed, 120 a month, MME equals 40.  Patient has been on this regimen for over the last 7 years.  Furthermore patient does find Cymbalta 30 mg effective for his both neuropathic pain as well as his mood symptoms.  Patient states that he is not as irritable as before.  Plan: -Sign opioid agreement -Prescription for hydrocodone for 2 weeks below -Continue Cymbalta 30 mg daily.  Consider increasing to 60 mg daily at next visit -[facility] -Consider monthly to q2 month visits   Plan of Care  Pharmacotherapy (Medications Ordered): Meds ordered this encounter  Medications  . HYDROcodone-acetaminophen (NORCO) 10-325 MG tablet    Sig: Take 1 tablet by mouth every 6 (six) hours as needed for up to 14 days for severe pain.    Dispense:  60 tablet    Refill:  0    Do not place this medication, or any other prescription from our practice, on "Automatic Refill". Patient may have prescription filled one day early if pharmacy is closed on scheduled refill date.  To last for 14 days from fill date   Pharmacological  management options:  Opioid Analgesics: The patient was informed that there is no guarantee that he would be a candidate for opioid analgesics. The decision will be made following CDC guidelines. This decision will be based on the results of diagnostic studies, as well as Mr. Buenger risk profile.   Membrane stabilizer: Tried and failed (Gabapentin and Lyrica), will trial Cymbalta  Muscle relaxant: To be determined at a later time however has tried Flexeril, baclofen which made him sedated   NSAID: To be determined at a later time however should avoid given his diabetic polyneuropathy  Other analgesic(s): To be determined at a later time   Interventional management options: Mr. Wieck was informed that there is no guarantee that he would be a candidate for interventional therapies. The decision will be based on the results of diagnostic studies, as well as Mr. Hickox risk profile.  Procedure(s) under consideration:  Lumbar epidural steroid injection PRN   Time Note: Greater than 50% of the 25 minute(s) of face-to-face time spent with Mr. Whedbee, was spent in counseling/coordination of care regarding: Mr. Kirkwood primary cause of pain, medication side effects, the opioid analgesic risks and possible complications, the appropriate use of his medications, realistic expectations, the goals of pain management (increased in functionality), the need to bring and keep the BMI below 30, the medication agreement and the patient's responsibilities when it comes to controlled substances. Provider-requested follow-up: Return in about 8 days (around 11/02/2017) for Medication Management.  Future Appointments  Date Time Provider [place]  11/02/2017  1:00 PM Gillis Santa, MD [facility] None    Primary Care Physician: Guadalupe Dawn, MD Location: [facility] Note by: Gillis Santa, M.D Date: 10/25/2017; Time: 10:06 AM  Patient Instructions  1. Sign opioid agreement 2. Follow up in 2 weeks  You have been given Rx for hydrocodone to last for 2 weeks.

## 2017-10-25 NOTE — Patient Instructions (Addendum)
1. Sign opioid agreement 2. Follow up in 2 weeks  You have been given Rx for hydrocodone to last for 2 weeks.

## 2017-10-25 NOTE — Progress Notes (Signed)
Safety precautions to be maintained throughout the outpatient stay will include: orient to surroundings, keep bed in low position, maintain call bell within reach at all times, provide assistance with transfer out of bed and ambulation.  

## 2017-10-26 LAB — COMPLIANCE DRUG ANALYSIS, UR

## 2017-11-02 ENCOUNTER — Encounter: Payer: Self-pay | Admitting: Student in an Organized Health Care Education/Training Program

## 2017-11-02 ENCOUNTER — Ambulatory Visit
Payer: Medicare Other | Attending: Student in an Organized Health Care Education/Training Program | Admitting: Student in an Organized Health Care Education/Training Program

## 2017-11-02 ENCOUNTER — Other Ambulatory Visit: Payer: Self-pay

## 2017-11-02 VITALS — BP 142/93 | HR 78 | Temp 98.2°F | Resp 18 | Ht 69.0 in | Wt 275.0 lb

## 2017-11-02 DIAGNOSIS — Z888 Allergy status to other drugs, medicaments and biological substances status: Secondary | ICD-10-CM | POA: Diagnosis not present

## 2017-11-02 DIAGNOSIS — I10 Essential (primary) hypertension: Secondary | ICD-10-CM | POA: Insufficient documentation

## 2017-11-02 DIAGNOSIS — M5136 Other intervertebral disc degeneration, lumbar region: Secondary | ICD-10-CM | POA: Insufficient documentation

## 2017-11-02 DIAGNOSIS — Z79891 Long term (current) use of opiate analgesic: Secondary | ICD-10-CM | POA: Diagnosis not present

## 2017-11-02 DIAGNOSIS — E1142 Type 2 diabetes mellitus with diabetic polyneuropathy: Secondary | ICD-10-CM | POA: Insufficient documentation

## 2017-11-02 DIAGNOSIS — M25511 Pain in right shoulder: Secondary | ICD-10-CM | POA: Diagnosis not present

## 2017-11-02 DIAGNOSIS — R000 Tachycardia, unspecified: Secondary | ICD-10-CM | POA: Insufficient documentation

## 2017-11-02 DIAGNOSIS — R109 Unspecified abdominal pain: Secondary | ICD-10-CM | POA: Diagnosis not present

## 2017-11-02 DIAGNOSIS — Z825 Family history of asthma and other chronic lower respiratory diseases: Secondary | ICD-10-CM | POA: Diagnosis not present

## 2017-11-02 DIAGNOSIS — Z79899 Other long term (current) drug therapy: Secondary | ICD-10-CM | POA: Diagnosis not present

## 2017-11-02 DIAGNOSIS — G894 Chronic pain syndrome: Secondary | ICD-10-CM

## 2017-11-02 DIAGNOSIS — N529 Male erectile dysfunction, unspecified: Secondary | ICD-10-CM | POA: Diagnosis not present

## 2017-11-02 DIAGNOSIS — R079 Chest pain, unspecified: Secondary | ICD-10-CM | POA: Diagnosis not present

## 2017-11-02 DIAGNOSIS — Z87891 Personal history of nicotine dependence: Secondary | ICD-10-CM | POA: Insufficient documentation

## 2017-11-02 DIAGNOSIS — M51369 Other intervertebral disc degeneration, lumbar region without mention of lumbar back pain or lower extremity pain: Secondary | ICD-10-CM

## 2017-11-02 DIAGNOSIS — M25512 Pain in left shoulder: Secondary | ICD-10-CM | POA: Diagnosis not present

## 2017-11-02 DIAGNOSIS — Z9889 Other specified postprocedural states: Secondary | ICD-10-CM

## 2017-11-02 DIAGNOSIS — E1149 Type 2 diabetes mellitus with other diabetic neurological complication: Secondary | ICD-10-CM | POA: Insufficient documentation

## 2017-11-02 DIAGNOSIS — E781 Pure hyperglyceridemia: Secondary | ICD-10-CM | POA: Insufficient documentation

## 2017-11-02 DIAGNOSIS — Z794 Long term (current) use of insulin: Secondary | ICD-10-CM | POA: Diagnosis not present

## 2017-11-02 DIAGNOSIS — M5416 Radiculopathy, lumbar region: Secondary | ICD-10-CM | POA: Diagnosis not present

## 2017-11-02 DIAGNOSIS — M5417 Radiculopathy, lumbosacral region: Secondary | ICD-10-CM | POA: Insufficient documentation

## 2017-11-02 DIAGNOSIS — Z880 Allergy status to penicillin: Secondary | ICD-10-CM | POA: Diagnosis not present

## 2017-11-02 DIAGNOSIS — Z7982 Long term (current) use of aspirin: Secondary | ICD-10-CM | POA: Insufficient documentation

## 2017-11-02 DIAGNOSIS — B351 Tinea unguium: Secondary | ICD-10-CM | POA: Diagnosis not present

## 2017-11-02 DIAGNOSIS — G4733 Obstructive sleep apnea (adult) (pediatric): Secondary | ICD-10-CM | POA: Diagnosis not present

## 2017-11-02 DIAGNOSIS — Z833 Family history of diabetes mellitus: Secondary | ICD-10-CM | POA: Insufficient documentation

## 2017-11-02 MED ORDER — DULOXETINE HCL 30 MG PO CPEP: 60.0000 mg | ORAL_CAPSULE | Freq: Every day | ORAL | 4 refills | Status: DC

## 2017-11-02 MED ORDER — HYDROCODONE-ACETAMINOPHEN 10-325 MG PO TABS: 1.0000 | ORAL_TABLET | Freq: Four times a day (QID) | ORAL | 0 refills | Status: DC | PRN

## 2017-11-02 NOTE — Progress Notes (Signed)
Nursing Pain Medication Assessment:  Safety precautions to be maintained throughout the outpatient stay will include: orient to surroundings, keep bed in low position, maintain call bell within reach at all times, provide assistance with transfer out of bed and ambulation.  Medication Inspection Compliance: Pill count conducted under aseptic conditions, in front of the patient. Neither the pills nor the bottle was removed from the patient's sight at any time. Once count was completed pills were immediately returned to the patient in their original bottle.  Medication: Hydrocodone/APAP Pill/Patch Count: 33 of 60 pills remain Pill/Patch Appearance: Markings consistent with prescribed medication Bottle Appearance: Standard pharmacy container. Clearly labeled. Filled Date: 05 / 14 / 2019 Last Medication intake:  Today

## 2017-11-02 NOTE — Progress Notes (Signed)
Patient's Name: [name]  MRN: [id number]  Referring Provider: Guadalupe Dawn, MD  DOB: 1965-08-26  PCP: Guadalupe Dawn, MD  DOS: 11/02/2017  Note by: Gillis Santa, MD  Service setting: Ambulatory outpatient  Specialty: Interventional Pain Management  Location: [facility] (AMB) Pain Management Facility    Patient type: Established   Primary Reason(s) for Visit: Encounter for prescription drug management. (Level of risk: moderate)  CC: Back Pain (low and left) and Leg Pain (worse on the left)  HPI  [name] is a 52 y.o. year old, male patient, who comes today for a medication management evaluation. He has HYPERTRIGLYCERIDEMIA, SEVERE; OBESITY, NOS; HYPERTENSION, BENIGN SYSTEMIC; Recurrent boils; Pain in joint; Lumbosacral neuritis; DISTURBANCE OF SKIN SENSATION; LEG EDEMA, BILATERAL; TACHYCARDIA; TRANSAMINASES, SERUM, ELEVATED; Venous stasis; Heel callus; Erectile dysfunction; OSA (obstructive sleep apnea); Type 2 diabetes mellitus with neurologic complication ([place]); Skin tag; Seborrheic dermatitis of scalp; Onychomycosis; Bilateral shoulder pain; Tobacco dependence in remission; Other malaise and fatigue; Difficulty concentrating; Weight loss counseling, encounter for; Flank pain, acute; Pilonidal disease; and Chest pain on their problem list. His primarily concern today is the Back Pain (low and left) and Leg Pain (worse on the left)  Pain Assessment: Location: Lower Back Radiating: left leg esp Onset: More than a month ago Duration: Chronic pain Quality: Constant, Dull, Pressure Severity: 4 /10 (subjective, self-reported pain score)  Note: Reported level is compatible with observation.                         When using our objective Pain Scale, levels between 6 and 10/10 are said to belong in an emergency room, as it progressively worsens from a 6/10, described as severely limiting, requiring emergency care not usually available at an outpatient pain management facility. At a 6/10 level,  communication becomes difficult and requires great effort. Assistance to reach the emergency department may be required. Facial flushing and profuse sweating along with potentially dangerous increases in heart rate and blood pressure will be evident. Effect on ADL:   Timing: Constant Modifying factors: medications BP: (!) 142/93  HR: 78  [name] was last scheduled for an appointment on 10/25/2017 for medication management. During today's appointment we reviewed Mr. Noy chronic pain status, as well as his outpatient medication regimen.  Patient returns today for medication refill.  Is endorsing improvement in his mood, outlook, irritability, depression since starting Cymbalta 30 mg.  The patient  reports that he does not use drugs. His body mass index is 40.61 kg/m.  Further details on both, my assessment(s), as well as the proposed treatment plan, please see below.  Controlled Substance Pharmacotherapy Assessment REMS (Risk Evaluation and Mitigation Strategy)  Analgesic: Hydrocodone 10 mg 4 times daily as needed, quantity 26-monthMME/day: 40 mg/day.  SHart Rochester RN  11/02/2017  1:22 PM  Sign at close encounter Nursing Pain Medication Assessment:  Safety precautions to be maintained throughout the outpatient stay will include: orient to surroundings, keep bed in low position, maintain call bell within reach at all times, provide assistance with transfer out of bed and ambulation.  Medication Inspection Compliance: Pill count conducted under aseptic conditions, in front of the patient. Neither the pills nor the bottle was removed from the patient's sight at any time. Once count was completed pills were immediately returned to the patient in their original bottle.  Medication: Hydrocodone/APAP Pill/Patch Count: 33 of 60 pills remain Pill/Patch Appearance: Markings consistent with prescribed medication Bottle Appearance: Standard pharmacy  container. Clearly labeled. Filled  Date: 05 / 14 / 2019 Last Medication intake:  Today   Pharmacokinetics: Liberation and absorption (onset of action): WNL Distribution (time to peak effect): WNL Metabolism and excretion (duration of action): WNL         Pharmacodynamics: Desired effects: Analgesia: Mr. Pease reports >50% benefit. Functional ability: Patient reports that medication allows him to accomplish basic ADLs Clinically meaningful improvement in function (CMIF): Sustained CMIF goals met Perceived effectiveness: Described as relatively effective, allowing for increase in activities of daily living (ADL) Undesirable effects: Side-effects or Adverse reactions: None reported Monitoring: [place] PMP: Online review of the past 70-monthperiod conducted. Compliant with practice rules and regulations Last UDS on record: Summary  Date Value Ref Range Status  10/20/2017 FINAL  Final    Comment:    ==================================================================== TOXASSURE COMP DRUG ANALYSIS,UR ==================================================================== Test                             Result       Flag       Units Drug Present not Declared for Prescription Verification   Diphenhydramine                PRESENT      UNEXPECTED Drug Absent but Declared for Prescription Verification   Duloxetine                     Not Detected UNEXPECTED   Salicylate                     Not Detected UNEXPECTED    Aspirin, as indicated in the declared medication list, is not    always detected even when used as directed.   Lidocaine                      Not Detected UNEXPECTED    Lidocaine, as indicated in the declared medication list, is not    always detected even when used as directed. ==================================================================== Test                      Result    Flag   Units      Ref Range   Creatinine              58               mg/dL       >=20 ==================================================================== Declared Medications:  The flagging and interpretation on this report are based on the  following declared medications.  Unexpected results may arise from  inaccuracies in the declared medications.  **Note: The testing scope of this panel includes these medications:  Duloxetine (Cymbalta)  **Note: The testing scope of this panel does not include small to  moderate amounts of these reported medications:  Aspirin (Aspirin 81)  Lidocaine (Xylocaine)  **Note: The testing scope of this panel does not include following  reported medications:  Insulin (Humalog)  Insulin (Lantus)  Ketoconazole (Nizoral)  Multivitamin (MVI)  Tadalafil (Cialis)  Testosterone (Depo-Testosterone)  Triamcinolone (Kenalog) ==================================================================== For clinical consultation, please call [phone] ====================================================================    UDS interpretation: Compliant          Medication Assessment Form: Reviewed. Patient indicates being compliant with therapy Treatment compliance: Compliant Risk Assessment Profile: Aberrant behavior: See prior evaluations. None observed or detected today Comorbid factors increasing risk of overdose: See prior notes. No  additional risks detected today Risk of substance use disorder (SUD): Low Opioid Risk Tool - 10/25/17 0854      Family History of Substance Abuse   Alcohol  Negative    Illegal Drugs  Positive Male    Rx Drugs  Negative      Personal History of Substance Abuse   Alcohol  Negative    Illegal Drugs  Negative    Rx Drugs  Negative      Age   Age between 26-45 years   No      History of Preadolescent Sexual Abuse   History of Preadolescent Sexual Abuse  Negative or Male      Psychological Disease   Psychological Disease  Negative    Depression  Negative      Total Score   Opioid Risk Tool Scoring   3    Opioid Risk Interpretation  Low Risk      ORT Scoring interpretation table:  Score <3 = Low Risk for SUD  Score between 4-7 = Moderate Risk for SUD  Score >8 = High Risk for Opioid Abuse   Risk Mitigation Strategies:  Patient Counseling: Covered Patient-Prescriber Agreement (PPA): Present and active  Notification to other healthcare providers: Done  Pharmacologic Plan: No change in therapy, at this time.             Laboratory Chemistry  Inflammation Markers (CRP: Acute Phase) (ESR: Chronic Phase) Lab Results  Component Value Date   ESRSEDRATE 82 (H) 04/09/2008                         Rheumatology Markers Lab Results  Component Value Date   RF < 20 04/04/2008   LABURIC 5.7 04/04/2008                        Renal Function Markers Lab Results  Component Value Date   BUN 15 09/20/2016   CREATININE 0.92 09/20/2016   BCR 16 09/20/2016   GFRAA 113 09/20/2016   GFRNONAA 98 09/20/2016                              Hepatic Function Markers Lab Results  Component Value Date   AST 22 03/25/2015   ALT 25 03/25/2015   ALBUMIN 4.1 03/25/2015   ALKPHOS 65 03/25/2015   HCVAB NEG 04/02/2008   LIPASE 24 03/25/2015                        Electrolytes Lab Results  Component Value Date   NA 140 09/20/2016   K 4.7 09/20/2016   CL 99 09/20/2016   CALCIUM 9.9 09/20/2016   MG 2.0 09/20/2016                        Neuropathy Markers Lab Results  Component Value Date   VITAMINB12 1199 (H) 06/17/2009   FOLATE >20.0 ng/mL 06/17/2009   HGBA1C 10.1 09/22/2017                        Bone Pathology Markers Lab Results  Component Value Date   TESTOSTERONE 174 (L) 05/16/2017                         Coagulation Parameters Lab Results  Component Value Date  PLT 224 05/16/2017   DDIMER (H) 04/04/2008    0.54        AT THE INHOUSE ESTABLISHED CUTOFF VALUE OF 0.48 ug/mL FEU, THIS ASSAY HAS BEEN DOCUMENTED IN THE LITERATURE TO HAVE                         Cardiovascular Markers Lab Results  Component Value Date   CKTOTAL 106 04/04/2008   CKMB 4.1 (H) 04/04/2008   TROPONINI 0.01        NO INDICATION OF MYOCARDIAL INJURY. 04/04/2008   HGB 14.5 05/16/2017   HCT 42.0 05/16/2017                         CA Markers No results found for: CEA, CA125, LABCA2                      Note: Lab results reviewed.  Recent Diagnostic Imaging Results  MR LUMBAR SPINE WO CONTRAST CLINICAL DATA:  Low back pain  EXAM: MRI LUMBAR SPINE WITHOUT CONTRAST  TECHNIQUE: Multiplanar, multisequence MR imaging of the lumbar spine was performed. No intravenous contrast was administered.  COMPARISON:  Lumbar MRI 02/11/2015  FINDINGS: Segmentation:  Normal  Alignment:  Normal  Vertebrae: Hemangioma L1 vertebral body. Negative for fracture or mass. Discogenic changes at L4-5  Conus medullaris: Extends to the L1 level and appears normal.  Paraspinal and other soft tissues: Negative  Disc levels:  T12-L1: Broad-based central disc protrusion unchanged. No significant stenosis  L1-2:  Negative  L2-3:  Negative  L3-4: Disc degeneration with disc bulging and mild endplate spurring. No significant stenosis.  L4-5: Postop laminectomy on the left. Disc degeneration and diffuse endplate spurring. Bilateral mild facet hypertrophy. Mild spinal stenosis, unchanged. Mild subarticular and foraminal stenosis due to spurring unchanged  L5-S1:  Negative  IMPRESSION: Disc bulging L3-4 unchanged  Postop laminectomy left L4-5. Disc degeneration and spurring. Mild spinal stenosis unchanged. Mild subarticular and foraminal stenosis unchanged  Overall, no change from the prior MRI of 02/11/2015  Electronically Signed   By: Franchot Gallo M.D.   On: 12/03/2016 09:16  Complexity Note: Imaging results reviewed. Results shared with Mr. Cull, using Layman's terms.                         Meds   Current Outpatient Medications:  .  aspirin 81 MG  tablet, Take 81 mg by mouth daily., Disp: , Rfl:  .  DULoxetine (CYMBALTA) 30 MG capsule, Take 2 capsules (60 mg total) by mouth daily., Disp: 60 capsule, Rfl: 4 .  [START ON 11/07/2017] HYDROcodone-acetaminophen (NORCO) 10-325 MG tablet, Take 1 tablet by mouth every 6 (six) hours as needed for severe pain., Disp: 120 tablet, Rfl: 0 .  insulin glargine (LANTUS) 100 UNIT/ML injection, INJECT up to 50 U into the skin daily., Disp: 20 mL, Rfl: 5 .  insulin lispro (HUMALOG) 100 UNIT/ML injection, Inject 0.2 mLs (20 Units total) into the skin 3 (three) times daily before meals., Disp: 20 mL, Rfl: 5 .  ketoconazole (NIZORAL) 2 % shampoo, Apply 1 application daily topically., Disp: 120 mL, Rfl: 0 .  lidocaine (XYLOCAINE) 5 % ointment, Apply 1 application topically as needed., Disp: 2500 g, Rfl: 5 .  Multiple Vitamin (MULTIVITAMIN WITH MINERALS) TABS tablet, Take 1 tablet by mouth daily., Disp: , Rfl:  .  SYRINGE-NEEDLE, DISP, 3 ML (B-D SYRINGE/NEEDLE 3CC/25GX5/8)  25G X 5/8" 3 ML MISC, Use to administer insulin four times per day., Disp: 100 each, Rfl: 12 .  triamcinolone cream (KENALOG) 0.1 %, Apply 1 application 2 (two) times daily topically. As needed. Avoid excessive use on face, Disp: 30 g, Rfl: 0  ROS  Constitutional: Denies any fever or chills Gastrointestinal: No reported hemesis, hematochezia, vomiting, or acute GI distress Musculoskeletal: Denies any acute onset joint swelling, redness, loss of ROM, or weakness Neurological: No reported episodes of acute onset apraxia, aphasia, dysarthria, agnosia, amnesia, paralysis, loss of coordination, or loss of consciousness  Allergies  Mr. Hemmer is allergic to penicillins; metformin and related; and statins.  PFSH  Drug: Mr. Lill  reports that he does not use drugs. Alcohol:  reports that he drinks alcohol. Tobacco:  reports that he quit smoking about 5 years ago. His smoking use included cigarettes. He has a 48.00 pack-year smoking history. He  quit smokeless tobacco use about 6 years ago. His smokeless tobacco use included snuff. Medical:  has a past medical history of Arthritis, Bilateral edema of lower extremity, Bulge of cervical disc without myelopathy, Chronic low back pain, Disorder of subcutaneous tissue, Environmental allergies, History of cervical fracture, History of multiple concussions, History of pneumothorax, History of seizures (pt states takes gabapentin to control seizures and for neuropathy---  followed by PCP  dr  Lorenso Courier ([facility])  seizures are note mentioned as hx in her last note or previous notes), Hypertension, OSA on CPAP, Peripheral neuropathy, Recurrent boils, Type 2 diabetes mellitus ([place]), and Wears glasses. Surgical: Mr. Menna  has a past surgical history that includes I & D RIGHT INDEX FINGER PURULENT FLEXOR TENOSYNOVITIS (08-23-2001); Lumbar disc surgery (02-08-2002); transthoracic echocardiogram (05-03-2007); REATTACHMENT LEFT INDEX FINGER (1999); Appendectomy (age 54); and Mass excision (N/A, 08/07/2015). Family: family history includes Asthma in his maternal aunt, maternal grandfather, maternal grandmother, maternal uncle, and mother; COPD in his mother; Diabetes in his maternal aunt, maternal grandfather, maternal grandmother, maternal uncle, and mother; Obesity in his maternal aunt, maternal grandfather, maternal grandmother, maternal uncle, and mother.  Constitutional Exam  General appearance: Well nourished, well developed, and well hydrated. In no apparent acute distress Vitals:   11/02/17 1318  BP: (!) 142/93  Pulse: 78  Resp: 18  Temp: 98.2 F (36.8 C)  SpO2: 99%  Weight: 275 lb (124.7 kg)  Height: 5' 9" (1.753 m)   BMI Assessment: Estimated body mass index is 40.61 kg/m as calculated from the following:   Height as of this encounter: 5' 9" (1.753 m).   Weight as of this encounter: 275 lb (124.7 kg).  BMI interpretation table: BMI level Category Range association with higher  incidence of chronic pain  <18 kg/m2 Underweight   18.5-24.9 kg/m2 Ideal body weight   25-29.9 kg/m2 Overweight Increased incidence by 20%  30-34.9 kg/m2 Obese (Class I) Increased incidence by 68%  35-39.9 kg/m2 Severe obesity (Class II) Increased incidence by 136%  >40 kg/m2 Extreme obesity (Class III) Increased incidence by 254%   Patient's current BMI Ideal Body weight  Body mass index is 40.61 kg/m. Ideal body weight: 70.7 kg (155 lb 13.8 oz) Adjusted ideal body weight: 92.3 kg (203 lb 8.3 oz)   BMI Readings from Last 4 Encounters:  11/02/17 40.61 kg/m  10/25/17 41.20 kg/m  10/11/17 41.20 kg/m  09/22/17 44.17 kg/m   Wt Readings from Last 4 Encounters:  11/02/17 275 lb (124.7 kg)  10/25/17 279 lb (126.6 kg)  10/11/17 279 lb (126.6 kg)  09/22/17 282 lb (127.9 kg)  Psych/Mental status: Alert, oriented x 3 (person, place, & time)       Eyes: PERLA Respiratory: No evidence of acute respiratory distress  Cervical Spine Area Exam  Skin & Axial Inspection: No masses, redness, edema, swelling, or associated skin lesions Alignment: Symmetrical Functional ROM: Unrestricted ROM      Stability: No instability detected Muscle Tone/Strength: Functionally intact. No obvious neuro-muscular anomalies detected. Sensory (Neurological): Unimpaired Palpation: No palpable anomalies              Upper Extremity (UE) Exam    Side: Right upper extremity  Side: Left upper extremity  Skin & Extremity Inspection: Skin color, temperature, and hair growth are WNL. No peripheral edema or cyanosis. No masses, redness, swelling, asymmetry, or associated skin lesions. No contractures.  Skin & Extremity Inspection: Skin color, temperature, and hair growth are WNL. No peripheral edema or cyanosis. No masses, redness, swelling, asymmetry, or associated skin lesions. No contractures.  Functional ROM: Unrestricted ROM          Functional ROM: Unrestricted ROM          Muscle Tone/Strength: Functionally  intact. No obvious neuro-muscular anomalies detected.  Muscle Tone/Strength: Functionally intact. No obvious neuro-muscular anomalies detected.  Sensory (Neurological): Unimpaired          Sensory (Neurological): Unimpaired          Palpation: No palpable anomalies              Palpation: No palpable anomalies              Provocative Test(s):  Phalen's test: deferred Tinel's test: deferred Apley's scratch test (touch opposite shoulder):  Action 1 (Across chest): deferred Action 2 (Overhead): deferred Action 3 (LB reach): deferred   Provocative Test(s):  Phalen's test: deferred Tinel's test: deferred Apley's scratch test (touch opposite shoulder):  Action 1 (Across chest): deferred Action 2 (Overhead): deferred Action 3 (LB reach): deferred    Thoracic Spine Area Exam  Skin & Axial Inspection: No masses, redness, or swelling Alignment: Symmetrical Functional ROM: Unrestricted ROM Stability: No instability detected Muscle Tone/Strength: Functionally intact. No obvious neuro-muscular anomalies detected. Sensory (Neurological): Unimpaired Muscle strength & Tone: No palpable anomalies  Lumbar Spine Area Exam  Skin & Axial Inspection:Well healed scar from previous spine surgery detected Alignment:Symmetrical Functional BZJ:IRCVELFYB ROMaffecting both sides Stability:No instability detected Muscle Tone/Strength:Functionally intact. No obvious neuro-muscular anomalies detected. Sensory (Neurological):Dermatomal pain pattern Palpation:Complains of area being tender to palpationBilateral Fist Percussion Test Provocative Tests: Lumbar Hyperextension and rotation test:Positivebilaterally for facet joint pain. Lumbar Lateral bending test:Positiveipsilateral radicular pain, bilaterally. Positive for bilateral foraminal stenosis. Patrick's Maneuver:evaluation deferred today  Gait & Posture Assessment  Ambulation:Patient ambulates using a  cane Gait:Antalgic Posture:Difficulty standing up straight, due to pain  Lower Extremity Exam    Side: Right lower extremity  Side: Left lower extremity  Stability: No instability observed          Stability: No instability observed          Skin & Extremity Inspection: Positive color changes  Skin & Extremity Inspection: Positive color changes  Functional ROM: Unrestricted ROM                  Functional ROM: Unrestricted ROM                  Muscle Tone/Strength: Functionally intact. No obvious neuro-muscular anomalies detected.  Muscle Tone/Strength: Functionally intact. No obvious neuro-muscular anomalies detected.  Sensory (Neurological):  Unimpaired  Sensory (Neurological): Unimpaired  Palpation: No palpable anomalies  Palpation: No palpable anomalies   Assessment  Primary Diagnosis & Pertinent Problem List: The primary encounter diagnosis was Chronic pain syndrome. Diagnoses of History of lumbar laminectomy (left L4/5), Lumbar radiculopathy, Lumbar degenerative disc disease, OSA (obstructive sleep apnea), and Chronic use of opiate drug for therapeutic purpose were also pertinent to this visit.  Status Diagnosis  Controlled Controlled Controlled 1. Chronic pain syndrome   2. History of lumbar laminectomy (left L4/5)   3. Lumbar radiculopathy   4. Lumbar degenerative disc disease   5. OSA (obstructive sleep apnea)   6. Chronic use of opiate drug for therapeutic purpose      General Recommendations: The pain condition that the patient suffers from is best treated with a multidisciplinary approach that involves an increase in physical activity to prevent de-conditioning and worsening of the pain cycle, as well as psychological counseling (formal and/or informal) to address the co-morbid psychological affects of pain. Treatment will often involve judicious use of pain medications and interventional procedures to decrease the pain, allowing the patient to participate in the  physical activity that will ultimately produce long-lasting pain reductions. The goal of the multidisciplinary approach is to return the patient to a higher level of overall function and to restore their ability to perform activities of daily living.  52 year old male with a history of chronic axial low back pain that radiates down into his left leg status post L4-L5 laminectomy as well as lumbar cyst removal who has been on chronic opioid therapy for greater than 10 years who presents primarily for medication management.  Patient has tried various medications including NSAIDs such as ibuprofen, naproxen, Mobic along with neuropathic agent such as Lyrica, gabapentin which she did not find effective and resulted in mental status changes. Patient wants to avoid chronic NSAID therapy given his type 2 diabetes, on insulin. Patient has lower extreme neuropathy, left greater than right secondary to diabetic polyneuropathy. Patient has been on chronic opioid therapy for greater than 20 years, PMP dating back to 2013. His chronic opioid regimen consist of hydrocodone 10 mg 4 times daily as needed, quantity 65-month MME equals 40.  Regards to previous interventional therapies, patient has tried epidural steroid injections which have been moderately effective for his radicular symptoms. Patient is also underwent a spinal cord stimulator trial with [employer] in October 2018 which was not effective for his lower extremity symptoms. Patient also has obstructive sleep apnea.  Patient returns today for medication management.  At his last visit, he was given a two-week prescription for hydrocodone.  His urine drug screen at that time was appropriate.  We will repeat today as well.  Should be positive for hydrocodone and its metabolites and negative for any benzodiazepines or any illicit substances.  So long as this UDS is appropriate, can obtain UDS as needed or every year which is standard protocol.  Patient  endorses improvement in his mood, irritability, outlook since starting Cymbalta 30 mg.  We discussed increasing his dose to 60 mg.  Prescription provided.  Patient has any side effects at this higher dose, but instructed him to decrease back down to his previous dose of 30 mg  Plan: -Refill hydrocodone as below for 1 month.  So long as UDS is appropriate from this visit, can space prescriptions out to every 2 months at next visit with CDionisio David NP. -Increase Cymbalta from 30 mg to 60 mg. Rx provided.  Patient instructed that if  he has any side effects at this higher dose to reduce back down to 30 mg -Consider lumbar epidural steroid injections/caudal's (previous [facility] October 2018) for worsening LE radicular sx -Consider monthly to q2 month visits (MM with NP Crystal Edison Pace- no dose escalation of current opioid regimen unless discussed with MD)   Plan of Care  Pharmacotherapy (Medications Ordered): Meds ordered this encounter  Medications  . HYDROcodone-acetaminophen (NORCO) 10-325 MG tablet    Sig: Take 1 tablet by mouth every 6 (six) hours as needed for severe pain.    Dispense:  120 tablet    Refill:  0    Do not place this medication, or any other prescription from our practice, on "Automatic Refill". Patient may have prescription filled one day early if pharmacy is closed on scheduled refill date.  To last for 30 days from fill date  . DULoxetine (CYMBALTA) 30 MG capsule    Sig: Take 2 capsules (60 mg total) by mouth daily.    Dispense:  60 capsule    Refill:  4   Lab-work, procedure(s), and/or referral(s): Orders Placed This Encounter  Procedures  . ToxASSURE Select 13 (MW), Urine    Pharmacological management options: Opioid Analgesics:The patient was informed that there is no guarantee that hewould be a candidate for opioid analgesics. The decision will be made following CDC guidelines. This decision will be based on the results of diagnostic studies, as well as  Mr.Tanzi's risk profile.   Membrane stabilizer:Tried and failed(Gabapentinand Lyrica),good benefit with Cymbalta, increase dose today 30--> 60 mg  Muscle relaxant:To be determined at a later timehowever has tried Flexeril, baclofen which made him sedated  NSAID:To be determined at a later timehowever should avoid given his diabetic polyneuropathy  Other analgesic(s):To be determined at a later time   Interventional management options: Mr.Bentleywas informed that there is no guarantee that hewould be a candidate for interventional therapies. The decision will be based on the results of diagnostic studies, as well as Mr.Hine's risk profile.  Procedure(s) under consideration: Lumbar epidural steroid injection PRN   Time Note: Greater than 50% of the 25 minute(s) of face-to-face time spent with Mr. Adrian, was spent in counseling/coordination of care regarding: Mr. Doberstein primary cause of pain, the treatment plan, medication side effects, the opioid analgesic risks and possible complications, realistic expectations, the goals of pain management (increased in functionality), the need to bring and keep the BMI below 30, the medication agreement and the patient's responsibilities when it comes to controlled substances.  Provider-requested follow-up: Return in about 1 month (around 11/30/2017) for MM with Crystal.  Future Appointments  Date Time Provider [place]  11/23/2017  8:30 AM Vevelyn Francois, NP [facility] None    Primary Care Physician: Guadalupe Dawn, MD Location: [facility] Note by: Gillis Santa, M.D Date: 11/02/2017; Time: 2:10 PM  Patient Instructions  1. Increase Cymbalta to 60 mg daily 2. Repeat UDS 3. Refill Hydrocodone for 1 month

## 2017-11-02 NOTE — Patient Instructions (Signed)
1. Increase Cymbalta to 60 mg daily 2. Repeat UDS 3. Refill Hydrocodone for 1 month

## 2017-11-11 LAB — TOXASSURE SELECT 13 (MW), URINE

## 2017-11-23 ENCOUNTER — Ambulatory Visit: Payer: Medicare Other | Attending: Nurse Practitioner | Admitting: Nurse Practitioner

## 2017-11-23 ENCOUNTER — Encounter: Payer: Self-pay | Admitting: Nurse Practitioner

## 2017-11-23 ENCOUNTER — Other Ambulatory Visit: Payer: Self-pay

## 2017-11-23 VITALS — BP 146/96 | HR 86 | Temp 98.3°F | Resp 16 | Ht 69.0 in | Wt 270.0 lb

## 2017-11-23 DIAGNOSIS — M25512 Pain in left shoulder: Secondary | ICD-10-CM | POA: Diagnosis not present

## 2017-11-23 DIAGNOSIS — M79605 Pain in left leg: Secondary | ICD-10-CM | POA: Diagnosis not present

## 2017-11-23 DIAGNOSIS — M79604 Pain in right leg: Secondary | ICD-10-CM | POA: Diagnosis not present

## 2017-11-23 DIAGNOSIS — Z79891 Long term (current) use of opiate analgesic: Secondary | ICD-10-CM | POA: Insufficient documentation

## 2017-11-23 DIAGNOSIS — G8929 Other chronic pain: Secondary | ICD-10-CM

## 2017-11-23 DIAGNOSIS — M5442 Lumbago with sciatica, left side: Secondary | ICD-10-CM | POA: Diagnosis not present

## 2017-11-23 DIAGNOSIS — G894 Chronic pain syndrome: Secondary | ICD-10-CM | POA: Diagnosis not present

## 2017-11-23 DIAGNOSIS — M25511 Pain in right shoulder: Secondary | ICD-10-CM

## 2017-11-23 DIAGNOSIS — M5441 Lumbago with sciatica, right side: Secondary | ICD-10-CM

## 2017-11-23 MED ORDER — LIDOCAINE 5 % EX OINT: 1.0000 "application " | TOPICAL_OINTMENT | CUTANEOUS | 1 refills | Status: DC | PRN

## 2017-11-23 MED ORDER — HYDROCODONE-ACETAMINOPHEN 10-325 MG PO TABS: 1.0000 | ORAL_TABLET | Freq: Four times a day (QID) | ORAL | 0 refills | Status: DC | PRN

## 2017-11-23 MED ORDER — DULOXETINE HCL 30 MG PO CPEP: 60.0000 mg | ORAL_CAPSULE | Freq: Every day | ORAL | 1 refills | Status: DC

## 2017-11-23 NOTE — Progress Notes (Signed)
Patient's Name: [name]  MRN: [id number]  Referring Provider: Guadalupe Dawn, MD  DOB: Aug 25, 1965  PCP: Drew Dawn, MD  DOS: 11/23/2017  Note by: Vevelyn Francois NP  Service setting: Ambulatory outpatient  Specialty: Interventional Pain Management  Location: [facility] (AMB) Pain Management Facility    Patient type: Established    Primary Reason(s) for Visit: Encounter for prescription drug management. (Level of risk: moderate)  CC: Back Pain (lower, left side) and Leg Pain (left)  HPI  [name] is a 52 y.o. year old, male patient, who comes today for a medication management evaluation. He has HYPERTRIGLYCERIDEMIA, SEVERE; OBESITY, NOS; HYPERTENSION, BENIGN SYSTEMIC; Recurrent boils; Pain in joint; Lumbosacral neuritis; DISTURBANCE OF SKIN SENSATION; LEG EDEMA, BILATERAL; TACHYCARDIA; TRANSAMINASES, SERUM, ELEVATED; Venous stasis; Heel callus; Erectile dysfunction; OSA (obstructive sleep apnea); Type 2 diabetes mellitus with neurologic complication ([place]); Skin tag; Seborrheic dermatitis of scalp; Chronic pain of both lower extremities (L>R); Bilateral shoulder pain; Tobacco dependence in remission; Other malaise and fatigue; Difficulty concentrating; Weight loss counseling, encounter for; Flank pain, acute; Pilonidal disease; Chest pain; Chronic bilateral low back pain with bilateral sciatica; Chronic pain syndrome; and Long term current use of opiate analgesic on their problem list. His primarily concern today is the Back Pain (lower, left side) and Leg Pain (left)  Pain Assessment: Location: Left, Lower Back Radiating: through left hip down back and side of left leg to left foot including all toes on left side Onset: More than a month ago Duration: Chronic pain Quality: Constant, Sharp, Shooting, Dull, Pressure Severity: 6 /10 (subjective, self-reported pain score)  Note: Reported level is compatible with observation. Clinically the patient looks like a 2/10 A 2/10 is viewed as "Mild  to Moderate" and described as noticeable and distracting. Impossible to hide from other people. More frequent flare-ups. Still possible to adapt and function close to normal. It can be very annoying and may have occasional stronger flare-ups. With discipline, patients may get used to it and adapt. Information on the proper use of the pain scale provided to the patient today. When using our objective Pain Scale, levels between 6 and 10/10 are said to belong in an emergency room, as it progressively worsens from a 6/10, described as severely limiting, requiring emergency care not usually available at an outpatient pain management facility. At a 6/10 level, communication becomes difficult and requires great effort. Assistance to reach the emergency department may be required. Facial flushing and profuse sweating along with potentially dangerous increases in heart rate and blood pressure will be evident. Effect on ADL: must change position frequesntly and cannot lift over 15# Timing: Constant Modifying factors: medications BP: (!) 146/96  HR: 86  [name] was last scheduled for an appointment on Visit date not found for medication management. During today's appointment we reviewed [name] chronic pain status, as well as his outpatient medication regimen. He admits that his pain is stable. He admits that he has PN from the DM in both legs and into the toes. He describes it as stabbing and tingling. He admits that the left is a lot more painful than the right. He admits that interventional procedures are effective for his treatment but because of his diabetes, he limits them. He admits that he will repeat a procedure when the pain progresses. He denies any side effects with nausea or constipation.   The patient  reports that he does not use drugs. His body mass index is 39.87 kg/m.  Further details on both,  my assessment(s), as well as the proposed treatment plan, please see below.  Controlled  Substance Pharmacotherapy Assessment REMS (Risk Evaluation and Mitigation Strategy)  Analgesic: Hydrocodone 10 mg 4 times daily as needed, quantity 3-monthMME/day: 40 mg/day.    WRise Patience RN  11/23/2017  8:36 AM  Sign at close encounter Nursing Pain Medication Assessment:  Safety precautions to be maintained throughout the outpatient stay will include: orient to surroundings, keep bed in low position, maintain call bell within reach at all times, provide assistance with transfer out of bed and ambulation.  Medication Inspection Compliance: Pill count conducted under aseptic conditions, in front of the patient. Neither the pills nor the bottle was removed from the patient's sight at any time. Once count was completed pills were immediately returned to the patient in their original bottle.  Medication: Hydrocodone/APAP Pill/Patch Count: 71 of 120 pills remain Pill/Patch Appearance: Markings consistent with prescribed medication Bottle Appearance: Standard pharmacy container. Clearly labeled. Filled Date: 5 / 30 / 2019 Last Medication intake:  Today   Pharmacokinetics: Liberation and absorption (onset of action): WNL Distribution (time to peak effect): WNL Metabolism and excretion (duration of action): WNL         Pharmacodynamics: Desired effects: Analgesia: Mr. BMillspaughreports >50% benefit. Functional ability: Patient reports that medication allows him to accomplish basic ADLs Clinically meaningful improvement in function (CMIF): Sustained CMIF goals met Perceived effectiveness: Described as relatively effective, allowing for increase in activities of daily living (ADL) Undesirable effects: Side-effects or Adverse reactions: None reported Monitoring: [place] PMP: Online review of the past 146-montheriod conducted. Compliant with practice rules and regulations Last UDS on record: Summary  Date Value Ref Range Status  11/02/2017 FINAL  Final    Comment:     ==================================================================== TOXASSURE SELECT 13 (MW) ==================================================================== Test                             Result       Flag       Units Drug Present and Declared for Prescription Verification   Hydrocodone                    692          EXPECTED   ng/mg creat   Hydromorphone                  105          EXPECTED   ng/mg creat   Norhydrocodone                 417          EXPECTED   ng/mg creat    Sources of hydrocodone include scheduled prescription    medications. Hydromorphone and norhydrocodone are expected    metabolites of hydrocodone. Hydromorphone is also available as a    scheduled prescription medication. ==================================================================== Test                      Result    Flag   Units      Ref Range   Creatinine              93               mg/dL      >=20 ==================================================================== Declared Medications:  The flagging and interpretation on this report are based on the  following declared medications.  Unexpected results  may arise from  inaccuracies in the declared medications.  **Note: The testing scope of this panel includes these medications:  Hydrocodone (Norco)  **Note: The testing scope of this panel does not include following  reported medications:  Acetaminophen (Norco)  Aspirin (Aspirin 81)  Duloxetine  Insulin (Humalog)  Insulin (Lantus)  Ketoconazole (Nizoral)  Lidocaine (Xylocaine)  Multivitamin  Triamcinolone (Kenalog) ==================================================================== For clinical consultation, please call [phone]. ====================================================================    UDS interpretation: Compliant          Medication Assessment Form: Reviewed. Patient indicates being compliant with therapy Treatment compliance: Compliant Risk Assessment  Profile: Aberrant behavior: See prior evaluations. None observed or detected today Comorbid factors increasing risk of overdose: See prior notes. No additional risks detected today Risk of substance use disorder (SUD): Low Opioid Risk Tool - 11/23/17 0832      Family History of Substance Abuse   Alcohol  Negative    Illegal Drugs  Positive Male    Rx Drugs  Negative      Personal History of Substance Abuse   Alcohol  Negative    Illegal Drugs  Negative    Rx Drugs  Negative      Age   Age between 70-45 years   No      History of Preadolescent Sexual Abuse   History of Preadolescent Sexual Abuse  Negative or Male      Psychological Disease   Psychological Disease  Negative    Depression  Negative      Total Score   Opioid Risk Tool Scoring  2    Opioid Risk Interpretation  Low Risk      ORT Scoring interpretation table:  Score <3 = Low Risk for SUD  Score between 4-7 = Moderate Risk for SUD  Score >8 = High Risk for Opioid Abuse   Risk Mitigation Strategies:  Patient Counseling: Covered Patient-Prescriber Agreement (PPA): Present and active  Notification to other healthcare providers: Done  Pharmacologic Plan: No change in therapy, at this time.             Laboratory Chemistry  Inflammation Markers (CRP: Acute Phase) (ESR: Chronic Phase) Lab Results  Component Value Date   ESRSEDRATE 82 (H) 04/09/2008                         Rheumatology Markers Lab Results  Component Value Date   RF < 20 04/04/2008   LABURIC 5.7 04/04/2008                        Renal Function Markers Lab Results  Component Value Date   BUN 15 09/20/2016   CREATININE 0.92 09/20/2016   BCR 16 09/20/2016   GFRAA 113 09/20/2016   GFRNONAA 98 09/20/2016                             Hepatic Function Markers Lab Results  Component Value Date   AST 22 03/25/2015   ALT 25 03/25/2015   ALBUMIN 4.1 03/25/2015   ALKPHOS 65 03/25/2015   HCVAB NEG 04/02/2008   LIPASE 24 03/25/2015                         Electrolytes Lab Results  Component Value Date   NA 140 09/20/2016   K 4.7 09/20/2016   CL 99 09/20/2016   CALCIUM 9.9 09/20/2016  MG 2.0 09/20/2016                        Neuropathy Markers Lab Results  Component Value Date   VITAMINB12 1199 (H) 06/17/2009   FOLATE >20.0 ng/mL 06/17/2009   HGBA1C 10.1 09/22/2017                        Bone Pathology Markers Lab Results  Component Value Date   TESTOSTERONE 174 (L) 05/16/2017                         Coagulation Parameters Lab Results  Component Value Date   PLT 224 05/16/2017   DDIMER (H) 04/04/2008    0.54        AT THE INHOUSE ESTABLISHED CUTOFF VALUE OF 0.48 ug/mL FEU, THIS ASSAY HAS BEEN DOCUMENTED IN THE LITERATURE TO HAVE                        Cardiovascular Markers Lab Results  Component Value Date   CKTOTAL 106 04/04/2008   CKMB 4.1 (H) 04/04/2008   TROPONINI 0.01        NO INDICATION OF MYOCARDIAL INJURY. 04/04/2008   HGB 14.5 05/16/2017   HCT 42.0 05/16/2017                         CA Markers No results found for: CEA, CA125, LABCA2                      Note: Lab results reviewed.  Recent Diagnostic Imaging Results  MR LUMBAR SPINE WO CONTRAST CLINICAL DATA:  Low back pain  EXAM: MRI LUMBAR SPINE WITHOUT CONTRAST  TECHNIQUE: Multiplanar, multisequence MR imaging of the lumbar spine was performed. No intravenous contrast was administered.  COMPARISON:  Lumbar MRI 02/11/2015  FINDINGS: Segmentation:  Normal  Alignment:  Normal  Vertebrae: Hemangioma L1 vertebral body. Negative for fracture or mass. Discogenic changes at L4-5  Conus medullaris: Extends to the L1 level and appears normal.  Paraspinal and other soft tissues: Negative  Disc levels:  T12-L1: Broad-based central disc protrusion unchanged. No significant stenosis  L1-2:  Negative  L2-3:  Negative  L3-4: Disc degeneration with disc bulging and mild endplate spurring. No significant  stenosis.  L4-5: Postop laminectomy on the left. Disc degeneration and diffuse endplate spurring. Bilateral mild facet hypertrophy. Mild spinal stenosis, unchanged. Mild subarticular and foraminal stenosis due to spurring unchanged  L5-S1:  Negative  IMPRESSION: Disc bulging L3-4 unchanged  Postop laminectomy left L4-5. Disc degeneration and spurring. Mild spinal stenosis unchanged. Mild subarticular and foraminal stenosis unchanged  Overall, no change from the prior MRI of 02/11/2015  Electronically Signed   By: Franchot Gallo M.D.   On: 12/03/2016 09:16  Complexity Note: Imaging results reviewed. Results shared with [name], using Layman's terms.                         Meds   Current Outpatient Medications:  .  aspirin 81 MG tablet, Take 81 mg by mouth daily., Disp: , Rfl:  .  DULoxetine (CYMBALTA) 30 MG capsule, Take 2 capsules (60 mg total) by mouth daily., Disp: 60 capsule, Rfl: 1 .  [START ON 12/10/2017] HYDROcodone-acetaminophen (NORCO) 10-325 MG tablet, Take 1 tablet by mouth every 6 (six) hours as  needed for severe pain., Disp: 120 tablet, Rfl: 0 .  insulin glargine (LANTUS) 100 UNIT/ML injection, INJECT up to 50 U into the skin daily., Disp: 20 mL, Rfl: 5 .  insulin lispro (HUMALOG) 100 UNIT/ML injection, Inject 0.2 mLs (20 Units total) into the skin 3 (three) times daily before meals., Disp: 20 mL, Rfl: 5 .  lidocaine (XYLOCAINE) 5 % ointment, Apply 1 application topically as needed., Disp: 2500 g, Rfl: 1 .  Multiple Vitamin (MULTIVITAMIN WITH MINERALS) TABS tablet, Take 1 tablet by mouth daily., Disp: , Rfl:  .  SYRINGE-NEEDLE, DISP, 3 ML (B-D SYRINGE/NEEDLE 3CC/25GX5/8) 25G X 5/8" 3 ML MISC, Use to administer insulin four times per day., Disp: 100 each, Rfl: 12 .  triamcinolone cream (KENALOG) 0.1 %, Apply 1 application 2 (two) times daily topically. As needed. Avoid excessive use on face, Disp: 30 g, Rfl: 0 .  ketoconazole (NIZORAL) 2 % shampoo, Apply 1  application daily topically. (Patient not taking: Reported on 11/23/2017), Disp: 120 mL, Rfl: 0  ROS  Constitutional: Denies any fever or chills Gastrointestinal: No reported hemesis, hematochezia, vomiting, or acute GI distress Musculoskeletal: Denies any acute onset joint swelling, redness, loss of ROM, or weakness Neurological: No reported episodes of acute onset apraxia, aphasia, dysarthria, agnosia, amnesia, paralysis, loss of coordination, or loss of consciousness  Allergies  [name] is allergic to penicillins; metformin and related; and statins.  PFSH  Drug: [name]  reports that he does not use drugs. Alcohol:  reports that he drinks alcohol. Tobacco:  reports that he quit smoking about 5 years ago. His smoking use included cigarettes. He has a 48.00 pack-year smoking history. He quit smokeless tobacco use about 6 years ago. His smokeless tobacco use included snuff. Medical:  has a past medical history of Arthritis, Bilateral edema of lower extremity, Bulge of cervical disc without myelopathy, Chronic low back pain, Disorder of subcutaneous tissue, Environmental allergies, History of cervical fracture, History of multiple concussions, History of pneumothorax, History of seizures (pt states takes gabapentin to control seizures and for neuropathy---  followed by PCP  dr  Lorenso Courier ([facility])  seizures are note mentioned as hx in her last note or previous notes), Hypertension, OSA on CPAP, Peripheral neuropathy, Recurrent boils, Type 2 diabetes mellitus ([place]), and Wears glasses. Surgical: [name]  has a past surgical history that includes I & D RIGHT INDEX FINGER PURULENT FLEXOR TENOSYNOVITIS (08-23-2001); Lumbar disc surgery (02-08-2002); transthoracic echocardiogram (05-03-2007); REATTACHMENT LEFT INDEX FINGER (1999); Appendectomy (age 25); and Mass excision (N/A, 08/07/2015). Family: family history includes Asthma in his maternal aunt, maternal grandfather, maternal  grandmother, maternal uncle, and mother; COPD in his mother; Diabetes in his maternal aunt, maternal grandfather, maternal grandmother, maternal uncle, and mother; Obesity in his maternal aunt, maternal grandfather, maternal grandmother, maternal uncle, and mother.  Constitutional Exam  General appearance: Well nourished, well developed, and well hydrated. In no apparent acute distress Vitals:   11/23/17 0820  BP: (!) 146/96  Pulse: 86  Resp: 16  Temp: 98.3 F (36.8 C)  TempSrc: Oral  SpO2: 99%  Weight: 270 lb (122.5 kg)  Height: _0  (1.753 m)  Psych/Mental status: Alert, oriented x 3 (person, place, & time)       Eyes: PERLA Respiratory: No evidence of acute respiratory distress  Lumbar Spine Area Exam  Skin & Axial Inspection: Well healed scar from previous spine surgery detected Alignment: Symmetrical Functional ROM: Unrestricted ROM       Stability: No instability  detected Muscle Tone/Strength: Functionally intact. No obvious neuro-muscular anomalies detected. Sensory (Neurological): Unimpaired Palpation: Tender       Provocative Tests: Lumbar Hyperextension/rotation test: (-)       Lumbar quadrant test (Kemp's test): deferred today       Lumbar Lateral bending test: (-)       Patrick's Maneuver: deferred today                   FABER test: deferred today       Thigh-thrust test: deferred today       S-I compression test: deferred today       S-I distraction test: deferred today        Gait & Posture Assessment  Ambulation: Patient ambulates using a cane Gait: Relatively normal for age and body habitus Posture: WNL   Lower Extremity Exam    Side: Right lower extremity  Side: Left lower extremity  Stability: No instability observed          Stability: No instability observed          Skin & Extremity Inspection: Positive color changes  Skin & Extremity Inspection: Positive color changes  Functional ROM: Unrestricted ROM                  Functional ROM: Unrestricted  ROM                  Muscle Tone/Strength: Functionally intact. No obvious neuro-muscular anomalies detected.  Muscle Tone/Strength: Functionally intact. No obvious neuro-muscular anomalies detected.  Sensory (Neurological): Unimpaired  Sensory (Neurological): Unimpaired  Palpation: No palpable anomalies  Palpation: No palpable anomalies   Assessment  Primary Diagnosis & Pertinent Problem List: The primary encounter diagnosis was Chronic pain of both lower extremities (L>R). Diagnoses of Chronic bilateral low back pain with bilateral sciatica, Bilateral shoulder pain, Chronic pain syndrome, and Long term current use of opiate analgesic were also pertinent to this visit.  Status Diagnosis  Controlled Controlled Controlled 1. Chronic pain of both lower extremities (L>R)   2. Chronic bilateral low back pain with bilateral sciatica   3. Bilateral shoulder pain   4. Chronic pain syndrome   5. Long term current use of opiate analgesic     Problems updated and reviewed during this visit: Problem  Chronic Bilateral Low Back Pain With Bilateral Sciatica  Chronic Pain Syndrome  Chronic pain of both lower extremities (L>R)  Long Term Current Use of Opiate Analgesic   Plan of Care  Pharmacotherapy (Medications Ordered): Meds ordered this encounter  Medications  . DULoxetine (CYMBALTA) 30 MG capsule    Sig: Take 2 capsules (60 mg total) by mouth daily.    Dispense:  60 capsule    Refill:  1    Order Specific Question:   Supervising Provider    Answer:   Milinda Pointer [phone]  . HYDROcodone-acetaminophen (NORCO) 10-325 MG tablet    Sig: Take 1 tablet by mouth every 6 (six) hours as needed for severe pain.    Dispense:  120 tablet    Refill:  0    Do not place this medication, or any other prescription from our practice, on "Automatic Refill". Patient may have prescription filled one day early if pharmacy is closed on scheduled refill date. May fill on: 12/10/2017   To last for 30  days from fill date    Order Specific Question:   Supervising Provider    Answer:   Milinda Pointer [phone]  . lidocaine (  XYLOCAINE) 5 % ointment    Sig: Apply 1 application topically as needed.    Dispense:  2500 g    Refill:  1    Order Specific Question:   Supervising Provider    AnswerMilinda Pointer [phone]   New Prescriptions   No medications on file   Medications administered today: [name] had no medications administered during this visit. Lab-work, procedure(s), and/or referral(s): No orders of the defined types were placed in this encounter.  Imaging and/or referral(s): None  Interventional therapies: Planned, scheduled, and/or pending:   Not at this time.    Provider-requested follow-up: Return in about 1 month (around 12/21/2017) for MedMgmt with Me Donella Stade Edison Pace).  Future Appointments  Date Time Provider [place]  12/21/2017  9:00 AM Vevelyn Francois, NP [facility] None   Primary Care Physician: Drew Dawn, MD Location: [facility] Management Facility Note by: Vevelyn Francois NP Date: 11/23/2017; Time: 10:17 AM  Pain Score Disclaimer: We use the NRS-11 scale. This is a self-reported, subjective measurement of pain severity with only modest accuracy. It is used primarily to identify changes within a particular patient. It must be understood that outpatient pain scales are significantly less accurate that those used for research, where they can be applied under ideal controlled circumstances with minimal exposure to variables. In reality, the score is likely to be a combination of pain intensity and pain affect, where pain affect describes the degree of emotional arousal or changes in action readiness caused by the sensory experience of pain. Factors such as social and work situation, setting, emotional state, anxiety levels, expectation, and prior pain experience may influence pain perception and show large inter-individual  differences that may also be affected by time variables.  Patient instructions provided during this appointment: Patient Instructions   ____________________________________________________________________________________________  Medication Rules  Applies to: All patients receiving prescriptions (written or electronic).  Pharmacy of record: Pharmacy where electronic prescriptions will be sent. If written prescriptions are taken to a different pharmacy, please inform the nursing staff. The pharmacy listed in the electronic medical record should be the one where you would like electronic prescriptions to be sent.  Prescription refills: Only during scheduled appointments. Applies to both, written and electronic prescriptions.  NOTE: The following applies primarily to controlled substances (Opioid* Pain Medications).   Patient's responsibilities: 1. Pain Pills: Bring all pain pills to every appointment (except for procedure appointments). 2. Pill Bottles: Bring pills in original pharmacy bottle. Always bring newest bottle. Bring bottle, even if empty. 3. Medication refills: You are responsible for knowing and keeping track of what medications you need refilled. The day before your appointment, write a list of all prescriptions that need to be refilled. Bring that list to your appointment and give it to the admitting nurse. Prescriptions will be written only during appointments. If you forget a medication, it will not be "Called in", "Faxed", or "electronically sent". You will need to get another appointment to get these prescribed. 4. Prescription Accuracy: You are responsible for carefully inspecting your prescriptions before leaving our office. Have the discharge nurse carefully go over each prescription with you, before taking them home. Make sure that your name is accurately spelled, that your address is correct. Check the name and dose of your medication to make sure it is accurate. Check the  number of pills, and the written instructions to make sure they are clear and accurate. Make sure that you are given enough medication to last  until your next medication refill appointment. 5. Taking Medication: Take medication as prescribed. Never take more pills than instructed. Never take medication more frequently than prescribed. Taking less pills or less frequently is permitted and encouraged, when it comes to controlled substances (written prescriptions).  6. Inform other Doctors: Always inform, all of your healthcare providers, of all the medications you take. 7. Pain Medication from other Providers: You are not allowed to accept any additional pain medication from any other Doctor or Healthcare provider. There are two exceptions to this rule. (see below) In the event that you require additional pain medication, you are responsible for notifying us, as stated below. 8. Medication Agreement: You are responsible for carefully reading and following our Medication Agreement. This must be signed before receiving any prescriptions from our practice. Safely store a copy of your signed Agreement. Violations to the Agreement will result in no further prescriptions. (Additional copies of our Medication Agreement are available upon request.) 9. Laws, Rules, & Regulations: All patients are expected to follow all Federal and [employer], [employer], Rules, [employer]. Ignorance of the Laws does not constitute a valid excuse. The use of any illegal substances is prohibited. 10. Adopted CDC guidelines & recommendations: Target dosing levels will be at or below 60 MME/day. Use of benzodiazepines** is not recommended.  Exceptions: There are only two exceptions to the rule of not receiving pain medications from other Healthcare Providers. 1. Exception #1 (Emergencies): In the event of an emergency (i.e.: accident requiring emergency care), you are allowed to receive additional pain medication. However, you are  responsible for: As soon as you are able, call our office [phone], at any time of the day or night, and leave a message stating your name, the date and nature of the emergency, and the name and dose of the medication prescribed. In the event that your call is answered by a member of our staff, make sure to document and save the date, time, and the name of the person that took your information.  2. Exception #2 (Planned Surgery): In the event that you are scheduled by another doctor or dentist to have any type of surgery or procedure, you are allowed (for a period no longer than 30 days), to receive additional pain medication, for the acute post-op pain. However, in this case, you are responsible for picking up a copy of our "Post-op Pain Management for Surgeons" handout, and giving it to your surgeon or dentist. This document is available at our office, and does not require an appointment to obtain it. Simply go to our office during business hours (Monday-Thursday from 8:00 AM to 4:00 PM) (Friday 8:00 AM to 12:00 Noon) or if you have a scheduled appointment with [place], prior to your surgery, and ask for it by name. In addition, you will need to provide [place] with your name, name of your surgeon, type of surgery, and date of procedure or surgery.  *Opioid medications include: morphine, codeine, oxycodone, oxymorphone, hydrocodone, hydromorphone, meperidine, tramadol, tapentadol, buprenorphine, fentanyl, methadone. **Benzodiazepine medications include: diazepam (Valium), alprazolam (Xanax), clonazepam (Klonopine), lorazepam (Ativan), clorazepate (Tranxene), chlordiazepoxide (Librium), estazolam (Prosom), oxazepam (Serax), temazepam (Restoril), triazolam (Halcion) (Last updated: 08/11/2017) ____________________________________________________________________________________________   ____________________________________________________________________________________________  Pain Scale  Introduction: The  pain score used by this practice is the Verbal Numerical Rating Scale (VNRS-11). This is an 11-point scale. It is for adults and children 10 years or older. There are significant differences in how the pain score is reported, used, and applied. Forget everything  you learned in the past and learn this scoring system.  General Information: The scale should reflect your current level of pain. Unless you are specifically asked for the level of your worst pain, or your average pain. If you are asked for one of these two, then it should be understood that it is over the past 24 hours.  Basic Activities of Daily Living (ADL): Personal hygiene, dressing, eating, transferring, and using restroom.  Instructions: Most patients tend to report their level of pain as a combination of two factors, their physical pain and their psychosocial pain. This last one is also known as "suffering" and it is reflection of how physical pain affects you socially and psychologically. From now on, report them separately. From this point on, when asked to report your pain level, report only your physical pain. Use the following table for reference.  Pain Clinic Pain Levels (0-5/10)  Pain Level Score  Description  No Pain 0   Mild pain 1 Nagging, annoying, but does not interfere with basic activities of daily living (ADL). Patients are able to eat, bathe, get dressed, toileting (being able to get on and off the toilet and perform personal hygiene functions), transfer (move in and out of bed or a chair without assistance), and maintain continence (able to control bladder and bowel functions). Blood pressure and heart rate are unaffected. A normal heart rate for a healthy adult ranges from 60 to 100 bpm (beats per minute).   Mild to moderate pain 2 Noticeable and distracting. Impossible to hide from other people. More frequent flare-ups. Still possible to adapt and function close to normal. It can be very annoying and may have  occasional stronger flare-ups. With discipline, patients may get used to it and adapt.   Moderate pain 3 Interferes significantly with activities of daily living (ADL). It becomes difficult to feed, bathe, get dressed, get on and off the toilet or to perform personal hygiene functions. Difficult to get in and out of bed or a chair without assistance. Very distracting. With effort, it can be ignored when deeply involved in activities.   Moderately severe pain 4 Impossible to ignore for more than a few minutes. With effort, patients may still be able to manage work or participate in some social activities. Very difficult to concentrate. Signs of autonomic nervous system discharge are evident: dilated pupils (mydriasis); mild sweating (diaphoresis); sleep interference. Heart rate becomes elevated (>115 bpm). Diastolic blood pressure (lower number) rises above 100 mmHg. Patients find relief in laying down and not moving.   Severe pain 5 Intense and extremely unpleasant. Associated with frowning face and frequent crying. Pain overwhelms the senses.  Ability to do any activity or maintain social relationships becomes significantly limited. Conversation becomes difficult. Pacing back and forth is common, as getting into a comfortable position is nearly impossible. Pain wakes you up from deep sleep. Physical signs will be obvious: pupillary dilation; increased sweating; goosebumps; brisk reflexes; cold, clammy hands and feet; nausea, vomiting or dry heaves; loss of appetite; significant sleep disturbance with inability to fall asleep or to remain asleep. When persistent, significant weight loss is observed due to the complete loss of appetite and sleep deprivation.  Blood pressure and heart rate becomes significantly elevated. Caution: If elevated blood pressure triggers a pounding headache associated with blurred vision, then the patient should immediately seek attention at an urgent or emergency care unit, as  these may be signs of an impending stroke.    Emergency Department Pain Levels (  6-10/10)  Emergency Room Pain 6 Severely limiting. Requires emergency care and should not be seen or managed at an outpatient pain management facility. Communication becomes difficult and requires great effort. Assistance to reach the emergency department may be required. Facial flushing and profuse sweating along with potentially dangerous increases in heart rate and blood pressure will be evident.   Distressing pain 7 Self-care is very difficult. Assistance is required to transport, or use restroom. Assistance to reach the emergency department will be required. Tasks requiring coordination, such as bathing and getting dressed become very difficult.   Disabling pain 8 Self-care is no longer possible. At this level, pain is disabling. The individual is unable to do even the most "basic" activities such as walking, eating, bathing, dressing, transferring to a bed, or toileting. Fine motor skills are lost. It is difficult to think clearly.   Incapacitating pain 9 Pain becomes incapacitating. Thought processing is no longer possible. Difficult to remember your own name. Control of movement and coordination are lost.   The worst pain imaginable 10 At this level, most patients pass out from pain. When this level is reached, collapse of the autonomic nervous system occurs, leading to a sudden drop in blood pressure and heart rate. This in turn results in a temporary and dramatic drop in blood flow to the brain, leading to a loss of consciousness. Fainting is one of the body's self defense mechanisms. Passing out puts the brain in a calmed state and causes it to shut down for a while, in order to begin the healing process.    Summary: 1. Refer to this scale when providing [place] with your pain level. 2. Be accurate and careful when reporting your pain level. This will help with your care. 3. Over-reporting your pain level will  lead to loss of credibility. 4. Even a level of 1/10 means that there is pain and will be treated at our facility. 5. High, inaccurate reporting will be documented as "Symptom Exaggeration", leading to loss of credibility and suspicions of possible secondary gains such as obtaining more narcotics, or wanting to appear disabled, for fraudulent reasons. 6. Only pain levels of 5 or below will be seen at our facility. 7. Pain levels of 6 and above will be sent to the Emergency Department and the appointment cancelled. ____________________________________________________________________________________________    BMI Assessment: Estimated body mass index is 39.87 kg/m as calculated from the following:   Height as of this encounter: _0  (1.753 m).   Weight as of this encounter: 270 lb (122.5 kg).  BMI interpretation table: BMI level Category Range association with higher incidence of chronic pain  <18 kg/m2 Underweight   18.5-24.9 kg/m2 Ideal body weight   25-29.9 kg/m2 Overweight Increased incidence by 20%  30-34.9 kg/m2 Obese (Class I) Increased incidence by 68%  35-39.9 kg/m2 Severe obesity (Class II) Increased incidence by 136%  >40 kg/m2 Extreme obesity (Class III) Increased incidence by 254%   Patient's current BMI Ideal Body weight  Body mass index is 39.87 kg/m. Ideal body weight: 70.7 kg (155 lb 13.8 oz) Adjusted ideal body weight: 91.4 kg (201 lb 8.3 oz)   BMI Readings from Last 4 Encounters:  11/23/17 39.87 kg/m  11/02/17 40.61 kg/m  10/25/17 41.20 kg/m  10/11/17 41.20 kg/m   Wt Readings from Last 4 Encounters:  11/23/17 270 lb (122.5 kg)  11/02/17 275 lb (124.7 kg)  10/25/17 279 lb (126.6 kg)  10/11/17 279 lb (126.6 kg)    Lidocaine and cymbalta  sent to pharmacy Hydrocodone prescription given.

## 2017-11-23 NOTE — Patient Instructions (Addendum)
____________________________________________________________________________________________  Medication Rules  Applies to: All patients receiving prescriptions (written or electronic).  Pharmacy of record: Pharmacy where electronic prescriptions will be sent. If written prescriptions are taken to a different pharmacy, please inform the nursing staff. The pharmacy listed in the electronic medical record should be the one where you would like electronic prescriptions to be sent.  Prescription refills: Only during scheduled appointments. Applies to both, written and electronic prescriptions.  NOTE: The following applies primarily to controlled substances (Opioid* Pain Medications).   Patient's responsibilities: 1. Pain Pills: Bring all pain pills to every appointment (except for procedure appointments). 2. Pill Bottles: Bring pills in original pharmacy bottle. Always bring newest bottle. Bring bottle, even if empty. 3. Medication refills: You are responsible for knowing and keeping track of what medications you need refilled. The day before your appointment, write a list of all prescriptions that need to be refilled. Bring that list to your appointment and give it to the admitting nurse. Prescriptions will be written only during appointments. If you forget a medication, it will not be "Called in", "Faxed", or "electronically sent". You will need to get another appointment to get these prescribed. 4. Prescription Accuracy: You are responsible for carefully inspecting your prescriptions before leaving our office. Have the discharge nurse carefully go over each prescription with you, before taking them home. Make sure that your name is accurately spelled, that your address is correct. Check the name and dose of your medication to make sure it is accurate. Check the number of pills, and the written instructions to make sure they are clear and accurate. Make sure that you are given enough medication to last  until your next medication refill appointment. 5. Taking Medication: Take medication as prescribed. Never take more pills than instructed. Never take medication more frequently than prescribed. Taking less pills or less frequently is permitted and encouraged, when it comes to controlled substances (written prescriptions).  6. Inform other Doctors: Always inform, all of your healthcare providers, of all the medications you take. 7. Pain Medication from other Providers: You are not allowed to accept any additional pain medication from any other Doctor or Healthcare provider. There are two exceptions to this rule. (see below) In the event that you require additional pain medication, you are responsible for notifying us, as stated below. 8. Medication Agreement: You are responsible for carefully reading and following our Medication Agreement. This must be signed before receiving any prescriptions from our practice. Safely store a copy of your signed Agreement. Violations to the Agreement will result in no further prescriptions. (Additional copies of our Medication Agreement are available upon request.) 9. Laws, Rules, & Regulations: All patients are expected to follow all Federal and State Laws, Statutes, Rules, & Regulations. Ignorance of the Laws does not constitute a valid excuse. The use of any illegal substances is prohibited. 10. Adopted CDC guidelines & recommendations: Target dosing levels will be at or below 60 MME/day. Use of benzodiazepines** is not recommended.  Exceptions: There are only two exceptions to the rule of not receiving pain medications from other Healthcare Providers. 1. Exception #1 (Emergencies): In the event of an emergency (i.e.: accident requiring emergency care), you are allowed to receive additional pain medication. However, you are responsible for: As soon as you are able, call our office [phone], at any time of the day or night, and leave a message stating your name, the  date and nature of the emergency, and the name and dose of the medication   prescribed. In the event that your call is answered by a member of our staff, make sure to document and save the date, time, and the name of the person that took your information.  2. Exception #2 (Planned Surgery): In the event that you are scheduled by another doctor or dentist to have any type of surgery or procedure, you are allowed (for a period no longer than 30 days), to receive additional pain medication, for the acute post-op pain. However, in this case, you are responsible for picking up a copy of our "Post-op Pain Management for Surgeons" handout, and giving it to your surgeon or dentist. This document is available at our office, and does not require an appointment to obtain it. Simply go to our office during business hours (Monday-Thursday from 8:00 AM to 4:00 PM) (Friday 8:00 AM to 12:00 Noon) or if you have a scheduled appointment with us, prior to your surgery, and ask for it by name. In addition, you will need to provide us with your name, name of your surgeon, type of surgery, and date of procedure or surgery.  *Opioid medications include: morphine, codeine, oxycodone, oxymorphone, hydrocodone, hydromorphone, meperidine, tramadol, tapentadol, buprenorphine, fentanyl, methadone. **Benzodiazepine medications include: diazepam (Valium), alprazolam (Xanax), clonazepam (Klonopine), lorazepam (Ativan), clorazepate (Tranxene), chlordiazepoxide (Librium), estazolam (Prosom), oxazepam (Serax), temazepam (Restoril), triazolam (Halcion) (Last updated: 08/11/2017) ____________________________________________________________________________________________   ____________________________________________________________________________________________  Pain Scale  Introduction: The pain score used by this practice is the Verbal Numerical Rating Scale (VNRS-11). This is an 11-point scale. It is for adults and children 10 years or  older. There are significant differences in how the pain score is reported, used, and applied. Forget everything you learned in the past and learn this scoring system.  General Information: The scale should reflect your current level of pain. Unless you are specifically asked for the level of your worst pain, or your average pain. If you are asked for one of these two, then it should be understood that it is over the past 24 hours.  Basic Activities of Daily Living (ADL): Personal hygiene, dressing, eating, transferring, and using restroom.  Instructions: Most patients tend to report their level of pain as a combination of two factors, their physical pain and their psychosocial pain. This last one is also known as "suffering" and it is reflection of how physical pain affects you socially and psychologically. From now on, report them separately. From this point on, when asked to report your pain level, report only your physical pain. Use the following table for reference.  Pain Clinic Pain Levels (0-5/10)  Pain Level Score  Description  No Pain 0   Mild pain 1 Nagging, annoying, but does not interfere with basic activities of daily living (ADL). Patients are able to eat, bathe, get dressed, toileting (being able to get on and off the toilet and perform personal hygiene functions), transfer (move in and out of bed or a chair without assistance), and maintain continence (able to control bladder and bowel functions). Blood pressure and heart rate are unaffected. A normal heart rate for a healthy adult ranges from 60 to 100 bpm (beats per minute).   Mild to moderate pain 2 Noticeable and distracting. Impossible to hide from other people. More frequent flare-ups. Still possible to adapt and function close to normal. It can be very annoying and may have occasional stronger flare-ups. With discipline, patients may get used to it and adapt.   Moderate pain 3 Interferes significantly with activities of daily  living (ADL).   It becomes difficult to feed, bathe, get dressed, get on and off the toilet or to perform personal hygiene functions. Difficult to get in and out of bed or a chair without assistance. Very distracting. With effort, it can be ignored when deeply involved in activities.   Moderately severe pain 4 Impossible to ignore for more than a few minutes. With effort, patients may still be able to manage work or participate in some social activities. Very difficult to concentrate. Signs of autonomic nervous system discharge are evident: dilated pupils (mydriasis); mild sweating (diaphoresis); sleep interference. Heart rate becomes elevated (>115 bpm). Diastolic blood pressure (lower number) rises above 100 mmHg. Patients find relief in laying down and not moving.   Severe pain 5 Intense and extremely unpleasant. Associated with frowning face and frequent crying. Pain overwhelms the senses.  Ability to do any activity or maintain social relationships becomes significantly limited. Conversation becomes difficult. Pacing back and forth is common, as getting into a comfortable position is nearly impossible. Pain wakes you up from deep sleep. Physical signs will be obvious: pupillary dilation; increased sweating; goosebumps; brisk reflexes; cold, clammy hands and feet; nausea, vomiting or dry heaves; loss of appetite; significant sleep disturbance with inability to fall asleep or to remain asleep. When persistent, significant weight loss is observed due to the complete loss of appetite and sleep deprivation.  Blood pressure and heart rate becomes significantly elevated. Caution: If elevated blood pressure triggers a pounding headache associated with blurred vision, then the patient should immediately seek attention at an urgent or emergency care unit, as these may be signs of an impending stroke.    Emergency Department Pain Levels (6-10/10)  Emergency Room Pain 6 Severely limiting. Requires emergency care  and should not be seen or managed at an outpatient pain management facility. Communication becomes difficult and requires great effort. Assistance to reach the emergency department may be required. Facial flushing and profuse sweating along with potentially dangerous increases in heart rate and blood pressure will be evident.   Distressing pain 7 Self-care is very difficult. Assistance is required to transport, or use restroom. Assistance to reach the emergency department will be required. Tasks requiring coordination, such as bathing and getting dressed become very difficult.   Disabling pain 8 Self-care is no longer possible. At this level, pain is disabling. The individual is unable to do even the most "basic" activities such as walking, eating, bathing, dressing, transferring to a bed, or toileting. Fine motor skills are lost. It is difficult to think clearly.   Incapacitating pain 9 Pain becomes incapacitating. Thought processing is no longer possible. Difficult to remember your own name. Control of movement and coordination are lost.   The worst pain imaginable 10 At this level, most patients pass out from pain. When this level is reached, collapse of the autonomic nervous system occurs, leading to a sudden drop in blood pressure and heart rate. This in turn results in a temporary and dramatic drop in blood flow to the brain, leading to a loss of consciousness. Fainting is one of the body's self defense mechanisms. Passing out puts the brain in a calmed state and causes it to shut down for a while, in order to begin the healing process.    Summary: 1. Refer to this scale when providing us with your pain level. 2. Be accurate and careful when reporting your pain level. This will help with your care. 3. Over-reporting your pain level will lead to loss of credibility. 4. Even   a level of 1/10 means that there is pain and will be treated at our facility. 5. High, inaccurate reporting will be  documented as "Symptom Exaggeration", leading to loss of credibility and suspicions of possible secondary gains such as obtaining more narcotics, or wanting to appear disabled, for fraudulent reasons. 6. Only pain levels of 5 or below will be seen at our facility. 7. Pain levels of 6 and above will be sent to the Emergency Department and the appointment cancelled. ____________________________________________________________________________________________    BMI Assessment: Estimated body mass index is 39.87 kg/m as calculated from the following:   Height as of this encounter: 5\' 9"  (1.753 m).   Weight as of this encounter: 270 lb (122.5 kg).  BMI interpretation table: BMI level Category Range association with higher incidence of chronic pain  <18 kg/m2 Underweight   18.5-24.9 kg/m2 Ideal body weight   25-29.9 kg/m2 Overweight Increased incidence by 20%  30-34.9 kg/m2 Obese (Class I) Increased incidence by 68%  35-39.9 kg/m2 Severe obesity (Class II) Increased incidence by 136%  >40 kg/m2 Extreme obesity (Class III) Increased incidence by 254%   Patient's current BMI Ideal Body weight  Body mass index is 39.87 kg/m. Ideal body weight: 70.7 kg (155 lb 13.8 oz) Adjusted ideal body weight: 91.4 kg (201 lb 8.3 oz)   BMI Readings from Last 4 Encounters:  11/23/17 39.87 kg/m  11/02/17 40.61 kg/m  10/25/17 41.20 kg/m  10/11/17 41.20 kg/m   Wt Readings from Last 4 Encounters:  11/23/17 270 lb (122.5 kg)  11/02/17 275 lb (124.7 kg)  10/25/17 279 lb (126.6 kg)  10/11/17 279 lb (126.6 kg)    Lidocaine and cymbalta sent to pharmacy Hydrocodone prescription given.

## 2017-11-23 NOTE — Progress Notes (Signed)
Nursing Pain Medication Assessment:  Safety precautions to be maintained throughout the outpatient stay will include: orient to surroundings, keep bed in low position, maintain call bell within reach at all times, provide assistance with transfer out of bed and ambulation.  Medication Inspection Compliance: Pill count conducted under aseptic conditions, in front of the patient. Neither the pills nor the bottle was removed from the patient's sight at any time. Once count was completed pills were immediately returned to the patient in their original bottle.  Medication: Hydrocodone/APAP Pill/Patch Count: 71 of 120 pills remain Pill/Patch Appearance: Markings consistent with prescribed medication Bottle Appearance: Standard pharmacy container. Clearly labeled. Filled Date: 5 / 30 / 2019 Last Medication intake:  Today

## 2017-12-21 ENCOUNTER — Encounter: Payer: Self-pay | Admitting: Nurse Practitioner

## 2017-12-21 ENCOUNTER — Other Ambulatory Visit: Payer: Self-pay

## 2017-12-21 ENCOUNTER — Ambulatory Visit: Payer: Medicare Other | Attending: Nurse Practitioner | Admitting: Nurse Practitioner

## 2017-12-21 VITALS — BP 146/98 | HR 77 | Temp 97.8°F | Resp 16 | Ht 68.0 in | Wt 270.0 lb

## 2017-12-21 DIAGNOSIS — E669 Obesity, unspecified: Secondary | ICD-10-CM | POA: Diagnosis not present

## 2017-12-21 DIAGNOSIS — G4733 Obstructive sleep apnea (adult) (pediatric): Secondary | ICD-10-CM | POA: Diagnosis not present

## 2017-12-21 DIAGNOSIS — Z87891 Personal history of nicotine dependence: Secondary | ICD-10-CM | POA: Insufficient documentation

## 2017-12-21 DIAGNOSIS — M79604 Pain in right leg: Secondary | ICD-10-CM | POA: Diagnosis not present

## 2017-12-21 DIAGNOSIS — M79605 Pain in left leg: Secondary | ICD-10-CM | POA: Diagnosis not present

## 2017-12-21 DIAGNOSIS — Z6841 Body Mass Index (BMI) 40.0 and over, adult: Secondary | ICD-10-CM | POA: Diagnosis not present

## 2017-12-21 DIAGNOSIS — I10 Essential (primary) hypertension: Secondary | ICD-10-CM | POA: Diagnosis not present

## 2017-12-21 DIAGNOSIS — M5442 Lumbago with sciatica, left side: Secondary | ICD-10-CM | POA: Diagnosis not present

## 2017-12-21 DIAGNOSIS — M5441 Lumbago with sciatica, right side: Secondary | ICD-10-CM

## 2017-12-21 DIAGNOSIS — M5417 Radiculopathy, lumbosacral region: Secondary | ICD-10-CM | POA: Diagnosis not present

## 2017-12-21 DIAGNOSIS — Z794 Long term (current) use of insulin: Secondary | ICD-10-CM | POA: Diagnosis not present

## 2017-12-21 DIAGNOSIS — Z79891 Long term (current) use of opiate analgesic: Secondary | ICD-10-CM | POA: Diagnosis not present

## 2017-12-21 DIAGNOSIS — M25511 Pain in right shoulder: Secondary | ICD-10-CM | POA: Insufficient documentation

## 2017-12-21 DIAGNOSIS — M25512 Pain in left shoulder: Secondary | ICD-10-CM | POA: Diagnosis not present

## 2017-12-21 DIAGNOSIS — Z7982 Long term (current) use of aspirin: Secondary | ICD-10-CM | POA: Insufficient documentation

## 2017-12-21 DIAGNOSIS — G894 Chronic pain syndrome: Secondary | ICD-10-CM | POA: Insufficient documentation

## 2017-12-21 DIAGNOSIS — Z5181 Encounter for therapeutic drug level monitoring: Secondary | ICD-10-CM | POA: Insufficient documentation

## 2017-12-21 DIAGNOSIS — G8929 Other chronic pain: Secondary | ICD-10-CM | POA: Diagnosis not present

## 2017-12-21 DIAGNOSIS — E119 Type 2 diabetes mellitus without complications: Secondary | ICD-10-CM | POA: Insufficient documentation

## 2017-12-21 MED ORDER — HYDROCODONE-ACETAMINOPHEN 10-325 MG PO TABS: 1.0000 | ORAL_TABLET | Freq: Four times a day (QID) | ORAL | 0 refills | Status: DC | PRN

## 2017-12-21 MED ORDER — LIDOCAINE 5 % EX OINT: 1.0000 "application " | TOPICAL_OINTMENT | CUTANEOUS | 1 refills | Status: DC | PRN

## 2017-12-21 MED ORDER — DULOXETINE HCL 30 MG PO CPEP: 60.0000 mg | ORAL_CAPSULE | Freq: Every day | ORAL | 1 refills | Status: DC

## 2017-12-21 NOTE — Progress Notes (Signed)
Patient's Name: [name]  MRN: [id number]  Referring Provider: Guadalupe Dawn, MD  DOB: May 30, 1966  PCP: Guadalupe Dawn, MD  DOS: 12/21/2017  Note by: Vevelyn Francois NP  Service setting: Ambulatory outpatient  Specialty: Interventional Pain Management  Location: [facility] (AMB) Pain Management Facility    Patient type: Established    Primary Reason(s) for Visit: Encounter for prescription drug management. (Level of risk: moderate)  CC: Back Pain (lower)  HPI  [name] is a 52 y.o. year old, male patient, who comes today for a medication management evaluation. He has HYPERTRIGLYCERIDEMIA, SEVERE; OBESITY, NOS; HYPERTENSION, BENIGN SYSTEMIC; Recurrent boils; Pain in joint; Lumbosacral neuritis; DISTURBANCE OF SKIN SENSATION; LEG EDEMA, BILATERAL; TACHYCARDIA; TRANSAMINASES, SERUM, ELEVATED; Venous stasis; Heel callus; Erectile dysfunction; OSA (obstructive sleep apnea); Type 2 diabetes mellitus with neurologic complication ([place]); Skin tag; Seborrheic dermatitis of scalp; Chronic pain of both lower extremities (L>R); Bilateral shoulder pain; Tobacco dependence in remission; Other malaise and fatigue; Difficulty concentrating; Weight loss counseling, encounter for; Flank pain, acute; Pilonidal disease; Chest pain; Chronic bilateral low back pain with bilateral sciatica; Chronic pain syndrome; and Long term current use of opiate analgesic on their problem list. His primarily concern today is the Back Pain (lower)  Pain Assessment: Location: Left, Lower Back Radiating: through hips down backs of both legs to feet, including toes, : worse on left side Onset: More than a month ago Duration: Chronic pain Quality: Constant, Sharp, Shooting, Dull, Pressure Severity: 5 /10 (subjective, self-reported pain score)  Note: Reported level is compatible with observation.                          Effect on ADL: must change position frequently and cannot lift over 15# Timing: Constant Modifying factors:  medications BP: (!) 146/98  HR: 77  [name] was last scheduled for an appointment on 11/23/2017 for medication management. During today's appointment we reviewed [name] chronic pain status, as well as his outpatient medication regimen. He denies any changes with his pain. He admits that the weather changes like rain makes his pain worse. He admits that then "everything hurts". He denies any side effects of his medication. He has no problem with constipation.  He admits that he his trying keep his Diabetes managed.   The patient  reports that he does not use drugs. His body mass index is 41.05 kg/m.  Further details on both, my assessment(s), as well as the proposed treatment plan, please see below.  Controlled Substance Pharmacotherapy Assessment REMS (Risk Evaluation and Mitigation Strategy)  Analgesic:Hydrocodone 10 mg 4 times daily as needed, quantity 29-monthMME/day:467mday.  WeRise PatienceRN  12/21/2017  9:13 AM  Sign at close encounter Nursing Pain Medication Assessment:  Safety precautions to be maintained throughout the outpatient stay will include: orient to surroundings, keep bed in low position, maintain call bell within reach at all times, provide assistance with transfer out of bed and ambulation.  Medication Inspection Compliance: Pill count conducted under aseptic conditions, in front of the patient. Neither the pills nor the bottle was removed from the patient's sight at any time. Once count was completed pills were immediately returned to the patient in their original bottle.  Medication: Hydrocodone/APAP Pill/Patch Count: 79 of 120 pills remain Pill/Patch Appearance: Markings consistent with prescribed medication Bottle Appearance: Standard pharmacy container. Clearly labeled. Filled Date: 6 / 29 / 2019 Last Medication intake:  Today   Pharmacokinetics: Liberation and absorption (onset of action):  WNL Distribution (time to peak effect): WNL Metabolism  and excretion (duration of action): WNL         Pharmacodynamics: Desired effects: Analgesia: [name] reports >50% benefit. Functional ability: Patient reports that medication allows him to accomplish basic ADLs Clinically meaningful improvement in function (CMIF): Sustained CMIF goals met Perceived effectiveness: Described as relatively effective, allowing for increase in activities of daily living (ADL) Undesirable effects: Side-effects or Adverse reactions: None reported Monitoring: [place] PMP: Online review of the past 83-monthperiod conducted. Compliant with practice rules and regulations Last UDS on record: Summary  Date Value Ref Range Status  11/02/2017 FINAL  Final    Comment:    ==================================================================== TOXASSURE SELECT 13 (MW) ==================================================================== Test                             Result       Flag       Units Drug Present and Declared for Prescription Verification   Hydrocodone                    692          EXPECTED   ng/mg creat   Hydromorphone                  105          EXPECTED   ng/mg creat   Norhydrocodone                 417          EXPECTED   ng/mg creat    Sources of hydrocodone include scheduled prescription    medications. Hydromorphone and norhydrocodone are expected    metabolites of hydrocodone. Hydromorphone is also available as a    scheduled prescription medication. ==================================================================== Test                      Result    Flag   Units      Ref Range   Creatinine              93               mg/dL      >=20 ==================================================================== Declared Medications:  The flagging and interpretation on this report are based on the  following declared medications.  Unexpected results may arise from  inaccuracies in the declared medications.  **Note: The testing scope of this panel  includes these medications:  Hydrocodone (Norco)  **Note: The testing scope of this panel does not include following  reported medications:  Acetaminophen (Norco)  Aspirin (Aspirin 81)  Duloxetine  Insulin (Humalog)  Insulin (Lantus)  Ketoconazole (Nizoral)  Lidocaine (Xylocaine)  Multivitamin  Triamcinolone (Kenalog) ==================================================================== For clinical consultation, please call ([phone] ====================================================================    UDS interpretation: Compliant          Medication Assessment Form: Reviewed. Patient indicates being compliant with therapy Treatment compliance: Compliant Risk Assessment Profile: Aberrant behavior: See prior evaluations. None observed or detected today Comorbid factors increasing risk of overdose: See prior notes. No additional risks detected today Risk of substance use disorder (SUD): Low Opioid Risk Tool - 12/21/17 0908      Family History of Substance Abuse   Alcohol  Negative    Illegal Drugs  Positive Male    Rx Drugs  Negative      Personal History of Substance Abuse   Alcohol  Negative    Illegal Drugs  Negative    Rx Drugs  Negative      Age   Age between 50-45 years   No      History of Preadolescent Sexual Abuse   History of Preadolescent Sexual Abuse  Negative or Male      Psychological Disease   Psychological Disease  Negative    Depression  Negative      Total Score   Opioid Risk Tool Scoring  2    Opioid Risk Interpretation  Low Risk      ORT Scoring interpretation table:  Score <3 = Low Risk for SUD  Score between 4-7 = Moderate Risk for SUD  Score >8 = High Risk for Opioid Abuse   Risk Mitigation Strategies:  Patient Counseling: Covered Patient-Prescriber Agreement (PPA): Present and active  Notification to other healthcare providers: Done  Pharmacologic Plan: No change in therapy, at this time.             Laboratory Chemistry   Inflammation Markers (CRP: Acute Phase) (ESR: Chronic Phase) Lab Results  Component Value Date   ESRSEDRATE 82 (H) 04/09/2008                         Rheumatology Markers Lab Results  Component Value Date   RF < 20 04/04/2008   LABURIC 5.7 04/04/2008                        Renal Function Markers Lab Results  Component Value Date   BUN 15 09/20/2016   CREATININE 0.92 09/20/2016   BCR 16 09/20/2016   GFRAA 113 09/20/2016   GFRNONAA 98 09/20/2016                             Hepatic Function Markers Lab Results  Component Value Date   AST 22 03/25/2015   ALT 25 03/25/2015   ALBUMIN 4.1 03/25/2015   ALKPHOS 65 03/25/2015   HCVAB NEG 04/02/2008   LIPASE 24 03/25/2015                        Electrolytes Lab Results  Component Value Date   NA 140 09/20/2016   K 4.7 09/20/2016   CL 99 09/20/2016   CALCIUM 9.9 09/20/2016   MG 2.0 09/20/2016                        Neuropathy Markers Lab Results  Component Value Date   VITAMINB12 1199 (H) 06/17/2009   FOLATE >20.0 ng/mL 06/17/2009   HGBA1C 10.1 09/22/2017                        Bone Pathology Markers Lab Results  Component Value Date   TESTOSTERONE 174 (L) 05/16/2017                         Coagulation Parameters Lab Results  Component Value Date   PLT 224 05/16/2017   DDIMER (H) 04/04/2008    0.54        AT THE INHOUSE ESTABLISHED CUTOFF VALUE OF 0.48 ug/mL FEU, THIS ASSAY HAS BEEN DOCUMENTED IN THE LITERATURE TO HAVE  Cardiovascular Markers Lab Results  Component Value Date   CKTOTAL 106 04/04/2008   CKMB 4.1 (H) 04/04/2008   TROPONINI 0.01        NO INDICATION OF MYOCARDIAL INJURY. 04/04/2008   HGB 14.5 05/16/2017   HCT 42.0 05/16/2017                         CA Markers No results found for: CEA, CA125, LABCA2                      Note: Lab results reviewed.  Recent Diagnostic Imaging Results  MR LUMBAR SPINE WO CONTRAST CLINICAL DATA:  Low back  pain  EXAM: MRI LUMBAR SPINE WITHOUT CONTRAST  TECHNIQUE: Multiplanar, multisequence MR imaging of the lumbar spine was performed. No intravenous contrast was administered.  COMPARISON:  Lumbar MRI 02/11/2015  FINDINGS: Segmentation:  Normal  Alignment:  Normal  Vertebrae: Hemangioma L1 vertebral body. Negative for fracture or mass. Discogenic changes at L4-5  Conus medullaris: Extends to the L1 level and appears normal.  Paraspinal and other soft tissues: Negative  Disc levels:  T12-L1: Broad-based central disc protrusion unchanged. No significant stenosis  L1-2:  Negative  L2-3:  Negative  L3-4: Disc degeneration with disc bulging and mild endplate spurring. No significant stenosis.  L4-5: Postop laminectomy on the left. Disc degeneration and diffuse endplate spurring. Bilateral mild facet hypertrophy. Mild spinal stenosis, unchanged. Mild subarticular and foraminal stenosis due to spurring unchanged  L5-S1:  Negative  IMPRESSION: Disc bulging L3-4 unchanged  Postop laminectomy left L4-5. Disc degeneration and spurring. Mild spinal stenosis unchanged. Mild subarticular and foraminal stenosis unchanged  Overall, no change from the prior MRI of 02/11/2015  Electronically Signed   By: Franchot Gallo M.D.   On: 12/03/2016 09:16  Complexity Note: Imaging results reviewed. Results shared with [name], using Layman's terms.                         Meds   Current Outpatient Medications:  .  aspirin 81 MG tablet, Take 81 mg by mouth daily., Disp: , Rfl:  .  insulin glargine (LANTUS) 100 UNIT/ML injection, INJECT up to 50 U into the skin daily., Disp: 20 mL, Rfl: 5 .  insulin lispro (HUMALOG) 100 UNIT/ML injection, Inject 0.2 mLs (20 Units total) into the skin 3 (three) times daily before meals., Disp: 20 mL, Rfl: 5 .  Multiple Vitamin (MULTIVITAMIN WITH MINERALS) TABS tablet, Take 1 tablet by mouth daily., Disp: , Rfl:  .  SYRINGE-NEEDLE, DISP, 3 ML (B-D  SYRINGE/NEEDLE 3CC/25GX5/8) 25G X 5/8" 3 ML MISC, Use to administer insulin four times per day., Disp: 100 each, Rfl: 12 .  triamcinolone cream (KENALOG) 0.1 %, Apply 1 application 2 (two) times daily topically. As needed. Avoid excessive use on face, Disp: 30 g, Rfl: 0 .  [START ON 01/09/2018] DULoxetine (CYMBALTA) 30 MG capsule, Take 2 capsules (60 mg total) by mouth daily., Disp: 60 capsule, Rfl: 1 .  [START ON 01/09/2018] HYDROcodone-acetaminophen (NORCO) 10-325 MG tablet, Take 1 tablet by mouth every 6 (six) hours as needed for severe pain., Disp: 120 tablet, Rfl: 0 .  [START ON 01/09/2018] lidocaine (XYLOCAINE) 5 % ointment, Apply 1 application topically as needed., Disp: 2500 g, Rfl: 1  ROS  Constitutional: Denies any fever or chills Gastrointestinal: No reported hemesis, hematochezia, vomiting, or acute GI distress Musculoskeletal: Denies any acute onset joint swelling, redness,  loss of ROM, or weakness Neurological: No reported episodes of acute onset apraxia, aphasia, dysarthria, agnosia, amnesia, paralysis, loss of coordination, or loss of consciousness  Allergies  [name] is allergic to penicillins; metformin and related; and statins.  PFSH  Drug: [name]  reports that he does not use drugs. Alcohol:  reports that he drinks alcohol. Tobacco:  reports that he quit smoking about 5 years ago. His smoking use included cigarettes. He has a 48.00 pack-year smoking history. He quit smokeless tobacco use about 6 years ago. His smokeless tobacco use included snuff. Medical:  has a past medical history of Arthritis, Bilateral edema of lower extremity, Bulge of cervical disc without myelopathy, Chronic low back pain, Disorder of subcutaneous tissue, Environmental allergies, History of cervical fracture, History of multiple concussions, History of pneumothorax, History of seizures (pt states takes gabapentin to control seizures and for neuropathy---  followed by PCP  dr  Lorenso Courier ([facility])  seizures are note mentioned as hx in her last note or previous notes), Hypertension, OSA on CPAP, Peripheral neuropathy, Recurrent boils, Type 2 diabetes mellitus ([place]), and Wears glasses. Surgical: [name]  has a past surgical history that includes I & D RIGHT INDEX FINGER PURULENT FLEXOR TENOSYNOVITIS (08-23-2001); Lumbar disc surgery (02-08-2002); transthoracic echocardiogram (05-03-2007); REATTACHMENT LEFT INDEX FINGER (1999); Appendectomy (age 89); and Mass excision (N/A, 08/07/2015). Family: family history includes Asthma in his maternal aunt, maternal grandfather, maternal grandmother, maternal uncle, and mother; COPD in his mother; Diabetes in his maternal aunt, maternal grandfather, maternal grandmother, maternal uncle, and mother; Obesity in his maternal aunt, maternal grandfather, maternal grandmother, maternal uncle, and mother.  Constitutional Exam  General appearance: Well nourished, well developed, and well hydrated. In no apparent acute distress Vitals:   12/21/17 0858 12/21/17 0905  BP: (!) 146/101 (!) 146/98  Pulse: 77   Resp: 16   Temp: 97.8 F (36.6 C)   TempSrc: Oral   SpO2: 98%   Weight: 270 lb (122.5 kg)   Height: 5' 8"  (1.727 m)   Psych/Mental status: Alert, oriented x 3 (person, place, & time)       Eyes: PERLA Respiratory: No evidence of acute respiratory distress  Cervical Spine Area Exam  Skin & Axial Inspection: No masses, redness, edema, swelling, or associated skin lesions Alignment: Symmetrical Functional ROM: Unrestricted ROM      Stability: No instability detected Muscle Tone/Strength: Functionally intact. No obvious neuro-muscular anomalies detected. Sensory (Neurological): Unimpaired Palpation: No palpable anomalies              Upper Extremity (UE) Exam    Side: Right upper extremity  Side: Left upper extremity  Skin & Extremity Inspection: Skin color, temperature, and hair growth are WNL. No peripheral edema or cyanosis. No masses,  redness, swelling, asymmetry, or associated skin lesions. No contractures.  Skin & Extremity Inspection: Skin color, temperature, and hair growth are WNL. No peripheral edema or cyanosis. No masses, redness, swelling, asymmetry, or associated skin lesions. No contractures.  Functional ROM: Unrestricted ROM          Functional ROM: Unrestricted ROM          Muscle Tone/Strength: Functionally intact. No obvious neuro-muscular anomalies detected.  Muscle Tone/Strength: Functionally intact. No obvious neuro-muscular anomalies detected.  Sensory (Neurological): Unimpaired          Sensory (Neurological): Unimpaired          Palpation: No palpable anomalies  Palpation: No palpable anomalies              Provocative Test(s):  Phalen's test: deferred Tinel's test: deferred Apley's scratch test (touch opposite shoulder):  Action 1 (Across chest): deferred Action 2 (Overhead): deferred Action 3 (LB reach): deferred   Provocative Test(s):  Phalen's test: deferred Tinel's test: deferred Apley's scratch test (touch opposite shoulder):  Action 1 (Across chest): deferred Action 2 (Overhead): deferred Action 3 (LB reach): deferred    Thoracic Spine Area Exam  Skin & Axial Inspection: No masses, redness, or swelling Alignment: Symmetrical Functional ROM: Unrestricted ROM Stability: No instability detected Muscle Tone/Strength: Functionally intact. No obvious neuro-muscular anomalies detected. Sensory (Neurological): Unimpaired Muscle strength & Tone: No palpable anomalies  Lumbar Spine Area Exam  Skin & Axial Inspection: No masses, redness, or swelling Alignment: Symmetrical Functional ROM: Unrestricted ROM       Stability: No instability detected Muscle Tone/Strength: Functionally intact. No obvious neuro-muscular anomalies detected. Sensory (Neurological): Unimpaired Palpation: No palpable anomalies       Provocative Tests: Lumbar Hyperextension/rotation test: deferred today        Lumbar quadrant test (Kemp's test): deferred today       Lumbar Lateral bending test: deferred today       Patrick's Maneuver: deferred today                   FABER test: deferred today       Thigh-thrust test: deferred today       S-I compression test: deferred today       S-I distraction test: deferred today        Gait & Posture Assessment  Ambulation: Patient ambulates using a cane Gait: Relatively normal for age and body habitus Posture: WNL   Lower Extremity Exam    Side: Right lower extremity  Side: Left lower extremity  Stability: No instability observed          Stability: No instability observed          Skin & Extremity Inspection: Positive color changes  Skin & Extremity Inspection: Positive color changes  Functional ROM: Unrestricted ROM                  Functional ROM: Unrestricted ROM                  Muscle Tone/Strength: Functionally intact. No obvious neuro-muscular anomalies detected.  Muscle Tone/Strength: Functionally intact. No obvious neuro-muscular anomalies detected.  Sensory (Neurological): Unimpaired  Sensory (Neurological): Unimpaired  Palpation: No palpable anomalies  Palpation: No palpable anomalies   Assessment  Primary Diagnosis & Pertinent Problem List: The primary encounter diagnosis was Lumbosacral neuritis. Diagnoses of Chronic pain of both lower extremities (L>R), Chronic bilateral low back pain with bilateral sciatica, Chronic pain syndrome, and Bilateral shoulder pain were also pertinent to this visit.  Status Diagnosis  Controlled Controlled Controlled 1. Lumbosacral neuritis   2. Chronic pain of both lower extremities (L>R)   3. Chronic bilateral low back pain with bilateral sciatica   4. Chronic pain syndrome   5. Bilateral shoulder pain     Problems updated and reviewed during this visit: No problems updated. Plan of Care  Pharmacotherapy (Medications Ordered): Meds ordered this encounter  Medications  . DULoxetine (CYMBALTA) 30  MG capsule    Sig: Take 2 capsules (60 mg total) by mouth daily.    Dispense:  60 capsule    Refill:  1    Order Specific Question:  Supervising Provider    Answer:   Milinda Pointer [phone]  . HYDROcodone-acetaminophen (NORCO) 10-325 MG tablet    Sig: Take 1 tablet by mouth every 6 (six) hours as needed for severe pain.    Dispense:  120 tablet    Refill:  0    Do not place this medication, or any other prescription from our practice, on "Automatic Refill". Patient may have prescription filled one day early if pharmacy is closed on scheduled refill date. May fill on: 01/09/2018  To last for 30 days from fill date    Order Specific Question:   Supervising Provider    Answer:   Milinda Pointer [phone]  . lidocaine (XYLOCAINE) 5 % ointment    Sig: Apply 1 application topically as needed.    Dispense:  2500 g    Refill:  1    Order Specific Question:   Supervising Provider    AnswerMilinda Pointer [phone]   New Prescriptions   No medications on file   Medications administered today: [name] had no medications administered during this visit. Lab-work, procedure(s), and/or referral(s): No orders of the defined types were placed in this encounter.  Imaging and/or referral(s): None  Interventional therapies: Planned, scheduled, and/or pending:   Not at this time.   Provider-requested follow-up: Return in about 1 month (around 01/18/2018) for MedMgmt with Me Donella Stade Edison Pace).  Future Appointments  Date Time Provider [place]  01/16/2018  8:30 AM Vevelyn Francois, NP [facility] [address] None   Primary Care Physician: Guadalupe Dawn, MD Location: [facility] Note by: Vevelyn Francois NP Date: 12/21/2017; Time: 2:04 PM  Pain Score Disclaimer: We use the NRS-11 scale. This is a self-reported, subjective measurement of pain severity with only modest accuracy. It is used primarily to identify changes within a particular patient. It must  be understood that outpatient pain scales are significantly less accurate that those used for research, where they can be applied under ideal controlled circumstances with minimal exposure to variables. In reality, the score is likely to be a combination of pain intensity and pain affect, where pain affect describes the degree of emotional arousal or changes in action readiness caused by the sensory experience of pain. Factors such as social and work situation, setting, emotional state, anxiety levels, expectation, and prior pain experience may influence pain perception and show large inter-individual differences that may also be affected by time variables.  Patient instructions provided during this appointment: Patient Instructions   Rx for lidocaine and Cymbalta has been escribed to your pharmacy. You have been given Rx for Hydrocodone with Tylenol to last until 02/08/2018.____________________________________________________________________________________________  Medication Rules  Applies to: All patients receiving prescriptions (written or electronic).  Pharmacy of record: Pharmacy where electronic prescriptions will be sent. If written prescriptions are taken to a different pharmacy, please inform the nursing staff. The pharmacy listed in the electronic medical record should be the one where you would like electronic prescriptions to be sent.  Prescription refills: Only during scheduled appointments. Applies to both, written and electronic prescriptions.  NOTE: The following applies primarily to controlled substances (Opioid* Pain Medications).   Patient's responsibilities: 1. Pain Pills: Bring all pain pills to every appointment (except for procedure appointments). 2. Pill Bottles: Bring pills in original pharmacy bottle. Always bring newest bottle. Bring bottle, even if empty. 3. Medication refills: You are responsible for knowing and keeping track of what medications you need refilled.  The day before your appointment, write  a list of all prescriptions that need to be refilled. Bring that list to your appointment and give it to the admitting nurse. Prescriptions will be written only during appointments. If you forget a medication, it will not be "Called in", "Faxed", or "electronically sent". You will need to get another appointment to get these prescribed. 4. Prescription Accuracy: You are responsible for carefully inspecting your prescriptions before leaving our office. Have the discharge nurse carefully go over each prescription with you, before taking them home. Make sure that your name is accurately spelled, that your address is correct. Check the name and dose of your medication to make sure it is accurate. Check the number of pills, and the written instructions to make sure they are clear and accurate. Make sure that you are given enough medication to last until your next medication refill appointment. 5. Taking Medication: Take medication as prescribed. Never take more pills than instructed. Never take medication more frequently than prescribed. Taking less pills or less frequently is permitted and encouraged, when it comes to controlled substances (written prescriptions).  6. Inform other Doctors: Always inform, all of your healthcare providers, of all the medications you take. 7. Pain Medication from other Providers: You are not allowed to accept any additional pain medication from any other Doctor or Healthcare provider. There are two exceptions to this rule. (see below) In the event that you require additional pain medication, you are responsible for notifying us, as stated below. 8. Medication Agreement: You are responsible for carefully reading and following our Medication Agreement. This must be signed before receiving any prescriptions from our practice. Safely store a copy of your signed Agreement. Violations to the Agreement will result in no further prescriptions.  (Additional copies of our Medication Agreement are available upon request.) 9. Laws, Rules, & Regulations: All patients are expected to follow all Federal and [employer], [employer], Rules, [employer] [facility]. Ignorance of the Laws does not constitute a valid excuse. The use of any illegal substances is prohibited. 10. Adopted CDC guidelines & recommendations: Target dosing levels will be at or below 60 MME/day. Use of benzodiazepines** is not recommended.  Exceptions: There are only two exceptions to the rule of not receiving pain medications from other Healthcare Providers. 1. Exception #1 (Emergencies): In the event of an emergency (i.e.: accident requiring emergency care), you are allowed to receive additional pain medication. However, you are responsible for: As soon as you are able, call our office [phone], at any time of the day or night, and leave a message stating your name, the date and nature of the emergency, and the name and dose of the medication prescribed. In the event that your call is answered by a member of our staff, make sure to document and save the date, time, and the name of the person that took your information.  2. Exception #2 (Planned Surgery): In the event that you are scheduled by another doctor or dentist to have any type of surgery or procedure, you are allowed (for a period no longer than 30 days), to receive additional pain medication, for the acute post-op pain. However, in this case, you are responsible for picking up a copy of our "Post-op Pain Management for Surgeons" handout, and giving it to your surgeon or dentist. This document is available at our office, and does not require an appointment to obtain it. Simply go to our office during business hours (Monday-Thursday from 8:00 AM to 4:00 PM) (Friday 8:00 AM to 12:00 Noon)  or if you have a scheduled appointment with [place], prior to your surgery, and ask for it by name. In addition, you will need to provide [place] with your  name, name of your surgeon, type of surgery, and date of procedure or surgery.  *Opioid medications include: morphine, codeine, oxycodone, oxymorphone, hydrocodone, hydromorphone, meperidine, tramadol, tapentadol, buprenorphine, fentanyl, methadone. **Benzodiazepine medications include: diazepam (Valium), alprazolam (Xanax), clonazepam (Klonopine), lorazepam (Ativan), clorazepate (Tranxene), chlordiazepoxide (Librium), estazolam (Prosom), oxazepam (Serax), temazepam (Restoril), triazolam (Halcion) (Last updated: 08/11/2017) ____________________________________________________________________________________________   BMI Assessment: Estimated body mass index is 41.05 kg/m as calculated from the following:   Height as of this encounter: 5' 8"  (1.727 m).   Weight as of this encounter: 270 lb (122.5 kg).  BMI interpretation table: BMI level Category Range association with higher incidence of chronic pain  <18 kg/m2 Underweight   18.5-24.9 kg/m2 Ideal body weight   25-29.9 kg/m2 Overweight Increased incidence by 20%  30-34.9 kg/m2 Obese (Class I) Increased incidence by 68%  35-39.9 kg/m2 Severe obesity (Class II) Increased incidence by 136%  >40 kg/m2 Extreme obesity (Class III) Increased incidence by 254%   Patient's current BMI Ideal Body weight  Body mass index is 41.05 kg/m. Ideal body weight: 68.4 kg (150 lb 12.7 oz) Adjusted ideal body weight: 90 kg (198 lb 7.6 oz)   BMI Readings from Last 4 Encounters:  12/21/17 41.05 kg/m  11/23/17 39.87 kg/m  11/02/17 40.61 kg/m  10/25/17 41.20 kg/m   Wt Readings from Last 4 Encounters:  12/21/17 270 lb (122.5 kg)  11/23/17 270 lb (122.5 kg)  11/02/17 275 lb (124.7 kg)  10/25/17 279 lb (126.6 kg)

## 2017-12-21 NOTE — Patient Instructions (Addendum)
Rx for lidocaine and Cymbalta has been escribed to your pharmacy. You have been given Rx for Hydrocodone with Tylenol to last until 02/08/2018.____________________________________________________________________________________________  Medication Rules  Applies to: All patients receiving prescriptions (written or electronic).  Pharmacy of record: Pharmacy where electronic prescriptions will be sent. If written prescriptions are taken to a different pharmacy, please inform the nursing staff. The pharmacy listed in the electronic medical record should be the one where you would like electronic prescriptions to be sent.  Prescription refills: Only during scheduled appointments. Applies to both, written and electronic prescriptions.  NOTE: The following applies primarily to controlled substances (Opioid* Pain Medications).   Patient's responsibilities: 1. Pain Pills: Bring all pain pills to every appointment (except for procedure appointments). 2. Pill Bottles: Bring pills in original pharmacy bottle. Always bring newest bottle. Bring bottle, even if empty. 3. Medication refills: You are responsible for knowing and keeping track of what medications you need refilled. The day before your appointment, write a list of all prescriptions that need to be refilled. Bring that list to your appointment and give it to the admitting nurse. Prescriptions will be written only during appointments. If you forget a medication, it will not be "Called in", "Faxed", or "electronically sent". You will need to get another appointment to get these prescribed. 4. Prescription Accuracy: You are responsible for carefully inspecting your prescriptions before leaving our office. Have the discharge nurse carefully go over each prescription with you, before taking them home. Make sure that your name is accurately spelled, that your address is correct. Check the name and dose of your medication to make sure it is accurate. Check  the number of pills, and the written instructions to make sure they are clear and accurate. Make sure that you are given enough medication to last until your next medication refill appointment. 5. Taking Medication: Take medication as prescribed. Never take more pills than instructed. Never take medication more frequently than prescribed. Taking less pills or less frequently is permitted and encouraged, when it comes to controlled substances (written prescriptions).  6. Inform other Doctors: Always inform, all of your healthcare providers, of all the medications you take. 7. Pain Medication from other Providers: You are not allowed to accept any additional pain medication from any other Doctor or Healthcare provider. There are two exceptions to this rule. (see below) In the event that you require additional pain medication, you are responsible for notifying us, as stated below. 8. Medication Agreement: You are responsible for carefully reading and following our Medication Agreement. This must be signed before receiving any prescriptions from our practice. Safely store a copy of your signed Agreement. Violations to the Agreement will result in no further prescriptions. (Additional copies of our Medication Agreement are available upon request.) 9. Laws, Rules, & Regulations: All patients are expected to follow all [address] and [employer], Rules, [employer]. Ignorance of the Laws does not constitute a valid excuse. The use of any illegal substances is prohibited. 10. Adopted CDC guidelines & recommendations: Target dosing levels will be at or below 60 MME/day. Use of benzodiazepines** is not recommended.  Exceptions: There are only two exceptions to the rule of not receiving pain medications from other Healthcare Providers. 1. Exception #1 (Emergencies): In the event of an emergency (i.e.: accident requiring emergency care), you are allowed to receive additional pain medication. However, you are  responsible for: As soon as you are able, call our office [phone], at any time of the day or  night, and leave a message stating your name, the date and nature of the emergency, and the name and dose of the medication prescribed. In the event that your call is answered by a member of our staff, make sure to document and save the date, time, and the name of the person that took your information.  2. Exception #2 (Planned Surgery): In the event that you are scheduled by another doctor or dentist to have any type of surgery or procedure, you are allowed (for a period no longer than 30 days), to receive additional pain medication, for the acute post-op pain. However, in this case, you are responsible for picking up a copy of our "Post-op Pain Management for Surgeons" handout, and giving it to your surgeon or dentist. This document is available at our office, and does not require an appointment to obtain it. Simply go to our office during business hours (Monday-Thursday from 8:00 AM to 4:00 PM) (Friday 8:00 AM to 12:00 Noon) or if you have a scheduled appointment with us, prior to your surgery, and ask for it by name. In addition, you will need to provide us with your name, name of your surgeon, type of surgery, and date of procedure or surgery.  *Opioid medications include: morphine, codeine, oxycodone, oxymorphone, hydrocodone, hydromorphone, meperidine, tramadol, tapentadol, buprenorphine, fentanyl, methadone. **Benzodiazepine medications include: diazepam (Valium), alprazolam (Xanax), clonazepam (Klonopine), lorazepam (Ativan), clorazepate (Tranxene), chlordiazepoxide (Librium), estazolam (Prosom), oxazepam (Serax), temazepam (Restoril), triazolam (Halcion) (Last updated: 08/11/2017) ____________________________________________________________________________________________   BMI Assessment: Estimated body mass index is 41.05 kg/m as calculated from the following:   Height as of this encounter: 5'  8" (1.727 m).   Weight as of this encounter: 270 lb (122.5 kg).  BMI interpretation table: BMI level Category Range association with higher incidence of chronic pain  <18 kg/m2 Underweight   18.5-24.9 kg/m2 Ideal body weight   25-29.9 kg/m2 Overweight Increased incidence by 20%  30-34.9 kg/m2 Obese (Class I) Increased incidence by 68%  35-39.9 kg/m2 Severe obesity (Class II) Increased incidence by 136%  >40 kg/m2 Extreme obesity (Class III) Increased incidence by 254%   Patient's current BMI Ideal Body weight  Body mass index is 41.05 kg/m. Ideal body weight: 68.4 kg (150 lb 12.7 oz) Adjusted ideal body weight: 90 kg (198 lb 7.6 oz)   BMI Readings from Last 4 Encounters:  12/21/17 41.05 kg/m  11/23/17 39.87 kg/m  11/02/17 40.61 kg/m  10/25/17 41.20 kg/m   Wt Readings from Last 4 Encounters:  12/21/17 270 lb (122.5 kg)  11/23/17 270 lb (122.5 kg)  11/02/17 275 lb (124.7 kg)  10/25/17 279 lb (126.6 kg)

## 2017-12-21 NOTE — Progress Notes (Signed)
Nursing Pain Medication Assessment:  Safety precautions to be maintained throughout the outpatient stay will include: orient to surroundings, keep bed in low position, maintain call bell within reach at all times, provide assistance with transfer out of bed and ambulation.  Medication Inspection Compliance: Pill count conducted under aseptic conditions, in front of the patient. Neither the pills nor the bottle was removed from the patient's sight at any time. Once count was completed pills were immediately returned to the patient in their original bottle.  Medication: Hydrocodone/APAP Pill/Patch Count: 79 of 120 pills remain Pill/Patch Appearance: Markings consistent with prescribed medication Bottle Appearance: Standard pharmacy container. Clearly labeled. Filled Date: 6 / 29 / 2019 Last Medication intake:  Today

## 2018-01-16 ENCOUNTER — Other Ambulatory Visit: Payer: Self-pay

## 2018-01-16 ENCOUNTER — Ambulatory Visit: Payer: Medicare Other | Attending: Nurse Practitioner | Admitting: Nurse Practitioner

## 2018-01-16 ENCOUNTER — Encounter: Payer: Self-pay | Admitting: Nurse Practitioner

## 2018-01-16 VITALS — BP 160/95 | HR 69 | Temp 98.1°F | Ht 68.0 in | Wt 270.0 lb

## 2018-01-16 DIAGNOSIS — M25512 Pain in left shoulder: Secondary | ICD-10-CM | POA: Diagnosis not present

## 2018-01-16 DIAGNOSIS — M5441 Lumbago with sciatica, right side: Secondary | ICD-10-CM | POA: Insufficient documentation

## 2018-01-16 DIAGNOSIS — M5442 Lumbago with sciatica, left side: Secondary | ICD-10-CM | POA: Diagnosis not present

## 2018-01-16 DIAGNOSIS — Z794 Long term (current) use of insulin: Secondary | ICD-10-CM | POA: Diagnosis not present

## 2018-01-16 DIAGNOSIS — Z6841 Body Mass Index (BMI) 40.0 and over, adult: Secondary | ICD-10-CM | POA: Diagnosis not present

## 2018-01-16 DIAGNOSIS — G894 Chronic pain syndrome: Secondary | ICD-10-CM | POA: Diagnosis not present

## 2018-01-16 DIAGNOSIS — G4733 Obstructive sleep apnea (adult) (pediatric): Secondary | ICD-10-CM | POA: Insufficient documentation

## 2018-01-16 DIAGNOSIS — L219 Seborrheic dermatitis, unspecified: Secondary | ICD-10-CM | POA: Insufficient documentation

## 2018-01-16 DIAGNOSIS — E669 Obesity, unspecified: Secondary | ICD-10-CM | POA: Insufficient documentation

## 2018-01-16 DIAGNOSIS — G8929 Other chronic pain: Secondary | ICD-10-CM

## 2018-01-16 DIAGNOSIS — M79604 Pain in right leg: Secondary | ICD-10-CM | POA: Diagnosis not present

## 2018-01-16 DIAGNOSIS — E1149 Type 2 diabetes mellitus with other diabetic neurological complication: Secondary | ICD-10-CM | POA: Diagnosis not present

## 2018-01-16 DIAGNOSIS — M25511 Pain in right shoulder: Secondary | ICD-10-CM | POA: Insufficient documentation

## 2018-01-16 DIAGNOSIS — M5417 Radiculopathy, lumbosacral region: Secondary | ICD-10-CM | POA: Diagnosis not present

## 2018-01-16 DIAGNOSIS — M79605 Pain in left leg: Secondary | ICD-10-CM | POA: Insufficient documentation

## 2018-01-16 DIAGNOSIS — Z7982 Long term (current) use of aspirin: Secondary | ICD-10-CM | POA: Insufficient documentation

## 2018-01-16 DIAGNOSIS — I10 Essential (primary) hypertension: Secondary | ICD-10-CM | POA: Insufficient documentation

## 2018-01-16 DIAGNOSIS — Z79891 Long term (current) use of opiate analgesic: Secondary | ICD-10-CM | POA: Insufficient documentation

## 2018-01-16 DIAGNOSIS — Z87891 Personal history of nicotine dependence: Secondary | ICD-10-CM | POA: Insufficient documentation

## 2018-01-16 DIAGNOSIS — Z5181 Encounter for therapeutic drug level monitoring: Secondary | ICD-10-CM | POA: Diagnosis not present

## 2018-01-16 DIAGNOSIS — E781 Pure hyperglyceridemia: Secondary | ICD-10-CM | POA: Insufficient documentation

## 2018-01-16 MED ORDER — LIDOCAINE 5 % EX OINT: 1.0000 "application " | TOPICAL_OINTMENT | CUTANEOUS | 1 refills | Status: DC | PRN

## 2018-01-16 MED ORDER — DULOXETINE HCL 30 MG PO CPEP: 60.0000 mg | ORAL_CAPSULE | Freq: Every day | ORAL | 1 refills | Status: DC

## 2018-01-16 MED ORDER — HYDROCODONE-ACETAMINOPHEN 10-325 MG PO TABS: 1.0000 | ORAL_TABLET | Freq: Four times a day (QID) | ORAL | 0 refills | Status: DC | PRN

## 2018-01-16 NOTE — Progress Notes (Signed)
Patient's Name: [name]  MRN: [id number]  Referring Provider: Guadalupe Dawn, MD  DOB: Jan 08, 1966  PCP: Guadalupe Dawn, MD  DOS: 01/16/2018  Note by: Vevelyn Francois NP  Service setting: Ambulatory outpatient  Specialty: Interventional Pain Management  Location: [facility] (AMB) Pain Management Facility    Patient type: Established    Primary Reason(s) for Visit: Encounter for prescription drug management. (Level of risk: moderate)  CC: Back Pain (lower, left)  HPI  [name] is a 52 y.o. year old, male patient, who comes today for a medication management evaluation. He has HYPERTRIGLYCERIDEMIA, SEVERE; OBESITY, NOS; HYPERTENSION, BENIGN SYSTEMIC; Recurrent boils; Pain in joint; Lumbosacral neuritis; DISTURBANCE OF SKIN SENSATION; LEG EDEMA, BILATERAL; TACHYCARDIA; TRANSAMINASES, SERUM, ELEVATED; Venous stasis; Heel callus; Erectile dysfunction; OSA (obstructive sleep apnea); Type 2 diabetes mellitus with neurologic complication ([place]); Skin tag; Seborrheic dermatitis of scalp; Chronic pain of both lower extremities (L>R); Bilateral shoulder pain; Tobacco dependence in remission; Other malaise and fatigue; Difficulty concentrating; Weight loss counseling, encounter for; Flank pain, acute; Pilonidal disease; Chest pain; Chronic bilateral low back pain with bilateral sciatica; Chronic pain syndrome; and Long term current use of opiate analgesic on their problem list. His primarily concern today is the Back Pain (lower, left)  Pain Assessment: Location: Left, Lower Back Radiating: Pain radiates down both hip down back of left leg to feet, left side is the worse Onset: More than a month ago Duration: Chronic pain Quality: Sharp, Stabbing, Dull, Constant Severity: 4 /10 (subjective, self-reported pain score)  Note: Reported level is compatible with observation. Clinically the patient looks like a 2/10 A 2/10 is viewed as "Mild to Moderate" and described as noticeable and distracting. Impossible to  hide from other people. More frequent flare-ups. Still possible to adapt and function close to normal. It can be very annoying and may have occasional stronger flare-ups. With discipline, patients may get used to it and adapt. Information on the proper use of the pain scale provided to the patient today. When using our objective Pain Scale, levels between 6 and 10/10 are said to belong in an emergency room, as it progressively worsens from a 6/10, described as severely limiting, requiring emergency care not usually available at an outpatient pain management facility. At a 6/10 level, communication becomes difficult and requires great effort. Assistance to reach the emergency department may be required. Facial flushing and profuse sweating along with potentially dangerous increases in heart rate and blood pressure will be evident. Effect on ADL: limits my daily activities Timing: Constant Modifying factors: medications, repositioning BP: (!) 160/95  HR: 69  [name] was last scheduled for an appointment on 12/21/2017 for medication management. During today's appointment we reviewed [name] chronic pain status, as well as his outpatient medication regimen. He has numbness and tingling. He admits that the left side is worse than the right. He admits that he have increased itch in the bottom of his left foot. He denies any side effects of his medication. He denies nausea or constipation.    The patient  reports that he does not use drugs. His body mass index is 41.05 kg/m.  Further details on both, my assessment(s), as well as the proposed treatment plan, please see below.  Controlled Substance Pharmacotherapy Assessment REMS (Risk Evaluation and Mitigation Strategy)  Analgesic:Hydrocodone 10 mg 4 times daily as needed, quantity 73-monthMME/day:432mday.    BrChauncey FischerRN  01/16/2018  8:35 AM  Sign at close encounter Nursing Pain Medication Assessment:  Safety  precautions to be  maintained throughout the outpatient stay will include: orient to surroundings, keep bed in low position, maintain call bell within reach at all times, provide assistance with transfer out of bed and ambulation.  Medication Inspection Compliance: Pill count conducted under aseptic conditions, in front of the patient. Neither the pills nor the bottle was removed from the patient's sight at any time. Once count was completed pills were immediately returned to the patient in their original bottle.  Medication: Hydrocodone/APAP Pill/Patch Count: 94 of 120 pills remain Pill/Patch Appearance: Markings consistent with prescribed medication Bottle Appearance: Standard pharmacy container. Clearly labeled. Filled Date: 7 / 29 / 2018 Last Medication intake:  Today   Pharmacokinetics: Liberation and absorption (onset of action): WNL Distribution (time to peak effect): WNL Metabolism and excretion (duration of action): WNL         Pharmacodynamics: Desired effects: Analgesia: Mr. Nehme reports >50% benefit. Functional ability: Patient reports that medication allows him to accomplish basic ADLs Clinically meaningful improvement in function (CMIF): Sustained CMIF goals met Perceived effectiveness: Described as relatively effective, allowing for increase in activities of daily living (ADL) Undesirable effects: Side-effects or Adverse reactions: None reported Monitoring: [place] PMP: Online review of the past 47-monthperiod conducted. Compliant with practice rules and regulations Last UDS on record: Summary  Date Value Ref Range Status  11/02/2017 FINAL  Final    Comment:    ==================================================================== TOXASSURE SELECT 13 (MW) ==================================================================== Test                             Result       Flag       Units Drug Present and Declared for Prescription Verification   Hydrocodone                    692           EXPECTED   ng/mg creat   Hydromorphone                  105          EXPECTED   ng/mg creat   Norhydrocodone                 417          EXPECTED   ng/mg creat    Sources of hydrocodone include scheduled prescription    medications. Hydromorphone and norhydrocodone are expected    metabolites of hydrocodone. Hydromorphone is also available as a    scheduled prescription medication. ==================================================================== Test                      Result    Flag   Units      Ref Range   Creatinine              93               mg/dL      >=20 ==================================================================== Declared Medications:  The flagging and interpretation on this report are based on the  following declared medications.  Unexpected results may arise from  inaccuracies in the declared medications.  **Note: The testing scope of this panel includes these medications:  Hydrocodone (Norco)  **Note: The testing scope of this panel does not include following  reported medications:  Acetaminophen (Norco)  Aspirin (Aspirin 81)  Duloxetine  Insulin (Humalog)  Insulin (Lantus)  Ketoconazole (Nizoral)  Lidocaine (Xylocaine)  Multivitamin  Triamcinolone (Kenalog) ==================================================================== For clinical consultation, please call [phone]. ====================================================================    UDS interpretation: Compliant          Medication Assessment Form: Reviewed. Patient indicates being compliant with therapy Treatment compliance: Compliant Risk Assessment Profile: Aberrant behavior: See prior evaluations. None observed or detected today Comorbid factors increasing risk of overdose: See prior notes. No additional risks detected today Risk of substance use disorder (SUD): Low Opioid Risk Tool - 12/21/17 0908      Family History of Substance Abuse   Alcohol  Negative    Illegal Drugs   Positive Male    Rx Drugs  Negative      Personal History of Substance Abuse   Alcohol  Negative    Illegal Drugs  Negative    Rx Drugs  Negative      Age   Age between 52-45 years   No      History of Preadolescent Sexual Abuse   History of Preadolescent Sexual Abuse  Negative or Male      Psychological Disease   Psychological Disease  Negative    Depression  Negative      Total Score   Opioid Risk Tool Scoring  2    Opioid Risk Interpretation  Low Risk      ORT Scoring interpretation table:  Score <3 = Low Risk for SUD  Score between 4-7 = Moderate Risk for SUD  Score >8 = High Risk for Opioid Abuse   Risk Mitigation Strategies:  Patient Counseling: Covered Patient-Prescriber Agreement (PPA): Present and active  Notification to other healthcare providers: Done  Pharmacologic Plan: No change in therapy, at this time.             Laboratory Chemistry  Inflammation Markers (CRP: Acute Phase) (ESR: Chronic Phase) Lab Results  Component Value Date   ESRSEDRATE 82 (H) 04/09/2008                         Rheumatology Markers Lab Results  Component Value Date   RF < 20 04/04/2008   LABURIC 5.7 04/04/2008                        Renal Function Markers Lab Results  Component Value Date   BUN 15 09/20/2016   CREATININE 0.92 09/20/2016   BCR 16 09/20/2016   GFRAA 113 09/20/2016   GFRNONAA 98 09/20/2016                             Hepatic Function Markers Lab Results  Component Value Date   AST 22 03/25/2015   ALT 25 03/25/2015   ALBUMIN 4.1 03/25/2015   ALKPHOS 65 03/25/2015   HCVAB NEG 04/02/2008   LIPASE 24 03/25/2015                        Electrolytes Lab Results  Component Value Date   NA 140 09/20/2016   K 4.7 09/20/2016   CL 99 09/20/2016   CALCIUM 9.9 09/20/2016   MG 2.0 09/20/2016                        Neuropathy Markers Lab Results  Component Value Date   VITAMINB12 1199 (H) 06/17/2009   FOLATE >20.0 ng/mL 06/17/2009   HGBA1C  10.1 09/22/2017  Bone Pathology Markers Lab Results  Component Value Date   TESTOSTERONE 174 (L) 05/16/2017                         Coagulation Parameters Lab Results  Component Value Date   PLT 224 05/16/2017   DDIMER (H) 04/04/2008    0.54        AT THE INHOUSE ESTABLISHED CUTOFF VALUE OF 0.48 ug/mL FEU, THIS ASSAY HAS BEEN DOCUMENTED IN THE LITERATURE TO HAVE                        Cardiovascular Markers Lab Results  Component Value Date   CKTOTAL 106 04/04/2008   CKMB 4.1 (H) 04/04/2008   TROPONINI 0.01        NO INDICATION OF MYOCARDIAL INJURY. 04/04/2008   HGB 14.5 05/16/2017   HCT 42.0 05/16/2017                         CA Markers No results found for: CEA, CA125, LABCA2                      Note: Lab results reviewed.  Recent Diagnostic Imaging Results  MR LUMBAR SPINE WO CONTRAST CLINICAL DATA:  Low back pain  EXAM: MRI LUMBAR SPINE WITHOUT CONTRAST  TECHNIQUE: Multiplanar, multisequence MR imaging of the lumbar spine was performed. No intravenous contrast was administered.  COMPARISON:  Lumbar MRI 02/11/2015  FINDINGS: Segmentation:  Normal  Alignment:  Normal  Vertebrae: Hemangioma L1 vertebral body. Negative for fracture or mass. Discogenic changes at L4-5  Conus medullaris: Extends to the L1 level and appears normal.  Paraspinal and other soft tissues: Negative  Disc levels:  T12-L1: Broad-based central disc protrusion unchanged. No significant stenosis  L1-2:  Negative  L2-3:  Negative  L3-4: Disc degeneration with disc bulging and mild endplate spurring. No significant stenosis.  L4-5: Postop laminectomy on the left. Disc degeneration and diffuse endplate spurring. Bilateral mild facet hypertrophy. Mild spinal stenosis, unchanged. Mild subarticular and foraminal stenosis due to spurring unchanged  L5-S1:  Negative  IMPRESSION: Disc bulging L3-4 unchanged  Postop laminectomy left L4-5. Disc  degeneration and spurring. Mild spinal stenosis unchanged. Mild subarticular and foraminal stenosis unchanged  Overall, no change from the prior MRI of 02/11/2015  Electronically Signed   By: Franchot Gallo M.D.   On: 12/03/2016 09:16  Complexity Note: Imaging results reviewed. Results shared with Mr. Chermak, using Layman's terms.                         Meds   Current Outpatient Medications:  .  aspirin 81 MG tablet, Take 81 mg by mouth daily., Disp: , Rfl:  .  DULoxetine (CYMBALTA) 30 MG capsule, Take 2 capsules (60 mg total) by mouth daily., Disp: 60 capsule, Rfl: 1 .  [START ON 02/08/2018] HYDROcodone-acetaminophen (NORCO) 10-325 MG tablet, Take 1 tablet by mouth every 6 (six) hours as needed for severe pain., Disp: 120 tablet, Rfl: 0 .  insulin glargine (LANTUS) 100 UNIT/ML injection, INJECT up to 50 U into the skin daily., Disp: 20 mL, Rfl: 5 .  insulin lispro (HUMALOG) 100 UNIT/ML injection, Inject 0.2 mLs (20 Units total) into the skin 3 (three) times daily before meals., Disp: 20 mL, Rfl: 5 .  lidocaine (XYLOCAINE) 5 % ointment, Apply 1 application topically as needed., Disp: 2500  g, Rfl: 1 .  Multiple Vitamin (MULTIVITAMIN WITH MINERALS) TABS tablet, Take 1 tablet by mouth daily., Disp: , Rfl:  .  SYRINGE-NEEDLE, DISP, 3 ML (B-D SYRINGE/NEEDLE 3CC/25GX5/8) 25G X 5/8" 3 ML MISC, Use to administer insulin four times per day., Disp: 100 each, Rfl: 12 .  triamcinolone cream (KENALOG) 0.1 %, Apply 1 application 2 (two) times daily topically. As needed. Avoid excessive use on face, Disp: 30 g, Rfl: 0  ROS  Constitutional: Denies any fever or chills Gastrointestinal: No reported hemesis, hematochezia, vomiting, or acute GI distress Musculoskeletal: Denies any acute onset joint swelling, redness, loss of ROM, or weakness Neurological: No reported episodes of acute onset apraxia, aphasia, dysarthria, agnosia, amnesia, paralysis, loss of coordination, or loss of  consciousness  Allergies  Mr. Sunga is allergic to penicillins; metformin and related; and statins.  PFSH  Drug: Mr. Lo  reports that he does not use drugs. Alcohol:  reports that he drinks alcohol. Tobacco:  reports that he quit smoking about 5 years ago. His smoking use included cigarettes. He has a 48.00 pack-year smoking history. He quit smokeless tobacco use about 6 years ago. His smokeless tobacco use included snuff. Medical:  has a past medical history of Arthritis, Bilateral edema of lower extremity, Bulge of cervical disc without myelopathy, Chronic low back pain, Disorder of subcutaneous tissue, Environmental allergies, History of cervical fracture, History of multiple concussions, History of pneumothorax, History of seizures (pt states takes gabapentin to control seizures and for neuropathy---  followed by PCP  dr  Lorenso Courier ([facility])  seizures are note mentioned as hx in her last note or previous notes), Hypertension, OSA on CPAP, Peripheral neuropathy, Recurrent boils, Type 2 diabetes mellitus ([place]), and Wears glasses. Surgical: Mr. Vicencio  has a past surgical history that includes I & D RIGHT INDEX FINGER PURULENT FLEXOR TENOSYNOVITIS (08-23-2001); Lumbar disc surgery (02-08-2002); transthoracic echocardiogram (05-03-2007); REATTACHMENT LEFT INDEX FINGER (1999); Appendectomy (age 20); and Mass excision (N/A, 08/07/2015). Family: family history includes Asthma in his maternal aunt, maternal grandfather, maternal grandmother, maternal uncle, and mother; COPD in his mother; Diabetes in his maternal aunt, maternal grandfather, maternal grandmother, maternal uncle, and mother; Obesity in his maternal aunt, maternal grandfather, maternal grandmother, maternal uncle, and mother.  Constitutional Exam  General appearance: alert, cooperative, in no distress and morbidly obese Vitals:   01/16/18 0836  BP: (!) 160/95  Pulse: 69  Temp: 98.1 F (36.7 C)  SpO2: 97%  Weight: 270 lb  (122.5 kg)  Height: 5' 8"  (1.727 m)  Psych/Mental status: Alert, oriented x 3 (person, place, & time)       Eyes: PERLA Respiratory: No evidence of acute respiratory distress  Lumbar Spine Area Exam  Skin & Axial Inspection: No masses, redness, or swelling Alignment: Symmetrical Functional ROM: Unrestricted ROM       Stability: No instability detected Muscle Tone/Strength: Functionally intact. No obvious neuro-muscular anomalies detected. Sensory (Neurological): Unimpaired Palpation: No palpable anomalies       Provocative Tests: Hyperextension/rotation test: deferred today       Lumbar quadrant test (Kemp's test): deferred today       Lateral bending test: deferred today       Patrick's Maneuver: deferred today                   FABER test: deferred today                   S-I anterior distraction/compression test: deferred today  S-I lateral compression test: deferred today         S-I Thigh-thrust test: deferred today         S-I Gaenslen's test: deferred today          Gait & Posture Assessment  Ambulation: Patient ambulates using a cane Gait: Relatively normal for age and body habitus Posture: WNL   Lower Extremity Exam    Side: Right lower extremity  Side: Left lower extremity  Stability: No instability observed          Stability: No instability observed          Skin & Extremity Inspection: Positive color changes  Skin & Extremity Inspection: Positive color changes  Functional ROM: Unrestricted ROM                  Functional ROM: Unrestricted ROM                  Muscle Tone/Strength: Functionally intact. No obvious neuro-muscular anomalies detected.  Muscle Tone/Strength: Functionally intact. No obvious neuro-muscular anomalies detected.  Sensory (Neurological): Unimpaired  Sensory (Neurological): Unimpaired  Palpation: No palpable anomalies  Palpation: No palpable anomalies   Assessment  Primary Diagnosis & Pertinent Problem List: The primary encounter  diagnosis was Chronic pain of both lower extremities (L>R). Diagnoses of Chronic bilateral low back pain with bilateral sciatica, Bilateral shoulder pain, Long term current use of opiate analgesic, and Chronic pain syndrome were also pertinent to this visit.  Status Diagnosis  Persistent Controlled Controlled 1. Chronic pain of both lower extremities (L>R)   2. Chronic bilateral low back pain with bilateral sciatica   3. Bilateral shoulder pain   4. Long term current use of opiate analgesic   5. Chronic pain syndrome     Problems updated and reviewed during this visit: No problems updated. Plan of Care  Pharmacotherapy (Medications Ordered): Meds ordered this encounter  Medications  . DULoxetine (CYMBALTA) 30 MG capsule    Sig: Take 2 capsules (60 mg total) by mouth daily.    Dispense:  60 capsule    Refill:  1    Order Specific Question:   Supervising Provider    Answer:   Milinda Pointer [phone]  . HYDROcodone-acetaminophen (NORCO) 10-325 MG tablet    Sig: Take 1 tablet by mouth every 6 (six) hours as needed for severe pain.    Dispense:  120 tablet    Refill:  0    Do not place this medication, or any other prescription from our practice, on "Automatic Refill". Patient may have prescription filled one day early if pharmacy is closed on scheduled refill date. May fill on: 02/08/2018  To last for 30 days from fill date    Order Specific Question:   Supervising Provider    Answer:   Milinda Pointer [phone]  . lidocaine (XYLOCAINE) 5 % ointment    Sig: Apply 1 application topically as needed.    Dispense:  2500 g    Refill:  1    Order Specific Question:   Supervising Provider    AnswerMilinda Pointer [phone]   New Prescriptions   No medications on file   Medications administered today: Fritz C. Hashimi had no medications administered during this visit. Lab-work, procedure(s), and/or referral(s): Orders Placed This Encounter  Procedures  . ToxASSURE Select  13 (MW), Urine   Imaging and/or referral(s): None  Interventional therapies: Planned, scheduled, and/or pending:   Not at this time.    Provider-requested  follow-up: Return in 6 weeks (on 02/27/2018) for MedMgmt with Me Donella Stade Edison Pace).  Future Appointments  Date Time Provider [place]  02/22/2018  8:00 AM Vevelyn Francois, NP [facility] None   Primary Care Physician: Guadalupe Dawn, MD Location: [facility] Note by: Vevelyn Francois NP Date: 01/16/2018; Time: 1:08 PM  Pain Score Disclaimer: We use the NRS-11 scale. This is a self-reported, subjective measurement of pain severity with only modest accuracy. It is used primarily to identify changes within a particular patient. It must be understood that outpatient pain scales are significantly less accurate that those used for research, where they can be applied under ideal controlled circumstances with minimal exposure to variables. In reality, the score is likely to be a combination of pain intensity and pain affect, where pain affect describes the degree of emotional arousal or changes in action readiness caused by the sensory experience of pain. Factors such as social and work situation, setting, emotional state, anxiety levels, expectation, and prior pain experience may influence pain perception and show large inter-individual differences that may also be affected by time variables.  Patient instructions provided during this appointment: Patient Instructions   ____________________________________________________________________________________________  Medication Rules  Applies to: All patients receiving prescriptions (written or electronic).  Pharmacy of record: Pharmacy where electronic prescriptions will be sent. If written prescriptions are taken to a different pharmacy, please inform the nursing staff. The pharmacy listed in the electronic medical record should be the one where you would like  electronic prescriptions to be sent.  Prescription refills: Only during scheduled appointments. Applies to both, written and electronic prescriptions.  NOTE: The following applies primarily to controlled substances (Opioid* Pain Medications).   Patient's responsibilities: 1. Pain Pills: Bring all pain pills to every appointment (except for procedure appointments). 2. Pill Bottles: Bring pills in original pharmacy bottle. Always bring newest bottle. Bring bottle, even if empty. 3. Medication refills: You are responsible for knowing and keeping track of what medications you need refilled. The day before your appointment, write a list of all prescriptions that need to be refilled. Bring that list to your appointment and give it to the admitting nurse. Prescriptions will be written only during appointments. If you forget a medication, it will not be "Called in", "Faxed", or "electronically sent". You will need to get another appointment to get these prescribed. 4. Prescription Accuracy: You are responsible for carefully inspecting your prescriptions before leaving our office. Have the discharge nurse carefully go over each prescription with you, before taking them home. Make sure that your name is accurately spelled, that your address is correct. Check the name and dose of your medication to make sure it is accurate. Check the number of pills, and the written instructions to make sure they are clear and accurate. Make sure that you are given enough medication to last until your next medication refill appointment. 5. Taking Medication: Take medication as prescribed. Never take more pills than instructed. Never take medication more frequently than prescribed. Taking less pills or less frequently is permitted and encouraged, when it comes to controlled substances (written prescriptions).  6. Inform other Doctors: Always inform, all of your healthcare providers, of all the medications you take. 7. Pain  Medication from other Providers: You are not allowed to accept any additional pain medication from any other Doctor or Healthcare provider. There are two exceptions to this rule. (see below) In the event that you require additional pain medication, you are responsible for notifying us,  as stated below. 8. Medication Agreement: You are responsible for carefully reading and following our Medication Agreement. This must be signed before receiving any prescriptions from our practice. Safely store a copy of your signed Agreement. Violations to the Agreement will result in no further prescriptions. (Additional copies of our Medication Agreement are available upon request.) 9. Laws, Rules, & Regulations: All patients are expected to follow all Federal and [employer], [employer], Rules, [employer]. Ignorance of the Laws does not constitute a valid excuse. The use of any illegal substances is prohibited. 10. Adopted CDC guidelines & recommendations: Target dosing levels will be at or below 60 MME/day. Use of benzodiazepines** is not recommended.  Exceptions: There are only two exceptions to the rule of not receiving pain medications from other Healthcare Providers. 1. Exception #1 (Emergencies): In the event of an emergency (i.e.: accident requiring emergency care), you are allowed to receive additional pain medication. However, you are responsible for: As soon as you are able, call our office [phone], at any time of the day or night, and leave a message stating your name, the date and nature of the emergency, and the name and dose of the medication prescribed. In the event that your call is answered by a member of our staff, make sure to document and save the date, time, and the name of the person that took your information.  2. Exception #2 (Planned Surgery): In the event that you are scheduled by another doctor or dentist to have any type of surgery or procedure, you are allowed (for a period no longer than  30 days), to receive additional pain medication, for the acute post-op pain. However, in this case, you are responsible for picking up a copy of our "Post-op Pain Management for Surgeons" handout, and giving it to your surgeon or dentist. This document is available at our office, and does not require an appointment to obtain it. Simply go to our office during business hours (Monday-Thursday from 8:00 AM to 4:00 PM) (Friday 8:00 AM to 12:00 Noon) or if you have a scheduled appointment with [place], prior to your surgery, and ask for it by name. In addition, you will need to provide [place] with your name, name of your surgeon, type of surgery, and date of procedure or surgery.  *Opioid medications include: morphine, codeine, oxycodone, oxymorphone, hydrocodone, hydromorphone, meperidine, tramadol, tapentadol, buprenorphine, fentanyl, methadone. **Benzodiazepine medications include: diazepam (Valium), alprazolam (Xanax), clonazepam (Klonopine), lorazepam (Ativan), clorazepate (Tranxene), chlordiazepoxide (Librium), estazolam (Prosom), oxazepam (Serax), temazepam (Restoril), triazolam (Halcion) (Last updated: 08/11/2017) ____________________________________________________________________________________________    BMI Assessment: Estimated body mass index is 41.05 kg/m as calculated from the following:   Height as of this encounter: 5' 8"  (1.727 m).   Weight as of this encounter: 270 lb (122.5 kg).  BMI interpretation table: BMI level Category Range association with higher incidence of chronic pain  <18 kg/m2 Underweight   18.5-24.9 kg/m2 Ideal body weight   25-29.9 kg/m2 Overweight Increased incidence by 20%  30-34.9 kg/m2 Obese (Class I) Increased incidence by 68%  35-39.9 kg/m2 Severe obesity (Class II) Increased incidence by 136%  >40 kg/m2 Extreme obesity (Class III) Increased incidence by 254%   Patient's current BMI Ideal Body weight  Body mass index is 41.05 kg/m. Ideal body weight: 68.4 kg (150  lb 12.7 oz) Adjusted ideal body weight: 90 kg (198 lb 7.6 oz)   BMI Readings from Last 4 Encounters:  01/16/18 41.05 kg/m  12/21/17 41.05 kg/m  11/23/17 39.87 kg/m  11/02/17  40.61 kg/m   Wt Readings from Last 4 Encounters:  01/16/18 270 lb (122.5 kg)  12/21/17 270 lb (122.5 kg)  11/23/17 270 lb (122.5 kg)  11/02/17 275 lb (124.7 kg)  ____________________________________________________________________________________________  Pain Scale  Introduction: The pain score used by this practice is the Verbal Numerical Rating Scale (VNRS-11). This is an 11-point scale. It is for adults and children 10 years or older. There are significant differences in how the pain score is reported, used, and applied. Forget everything you learned in the past and learn this scoring system.  General Information: The scale should reflect your current level of pain. Unless you are specifically asked for the level of your worst pain, or your average pain. If you are asked for one of these two, then it should be understood that it is over the past 24 hours.  Basic Activities of Daily Living (ADL): Personal hygiene, dressing, eating, transferring, and using restroom.  Instructions: Most patients tend to report their level of pain as a combination of two factors, their physical pain and their psychosocial pain. This last one is also known as "suffering" and it is reflection of how physical pain affects you socially and psychologically. From now on, report them separately. From this point on, when asked to report your pain level, report only your physical pain. Use the following table for reference.  Pain Clinic Pain Levels (0-5/10)  Pain Level Score  Description  No Pain 0   Mild pain 1 Nagging, annoying, but does not interfere with basic activities of daily living (ADL). Patients are able to eat, bathe, get dressed, toileting (being able to get on and off the toilet and perform personal hygiene functions),  transfer (move in and out of bed or a chair without assistance), and maintain continence (able to control bladder and bowel functions). Blood pressure and heart rate are unaffected. A normal heart rate for a healthy adult ranges from 60 to 100 bpm (beats per minute).   Mild to moderate pain 2 Noticeable and distracting. Impossible to hide from other people. More frequent flare-ups. Still possible to adapt and function close to normal. It can be very annoying and may have occasional stronger flare-ups. With discipline, patients may get used to it and adapt.   Moderate pain 3 Interferes significantly with activities of daily living (ADL). It becomes difficult to feed, bathe, get dressed, get on and off the toilet or to perform personal hygiene functions. Difficult to get in and out of bed or a chair without assistance. Very distracting. With effort, it can be ignored when deeply involved in activities.   Moderately severe pain 4 Impossible to ignore for more than a few minutes. With effort, patients may still be able to manage work or participate in some social activities. Very difficult to concentrate. Signs of autonomic nervous system discharge are evident: dilated pupils (mydriasis); mild sweating (diaphoresis); sleep interference. Heart rate becomes elevated (>115 bpm). Diastolic blood pressure (lower number) rises above 100 mmHg. Patients find relief in laying down and not moving.   Severe pain 5 Intense and extremely unpleasant. Associated with frowning face and frequent crying. Pain overwhelms the senses.  Ability to do any activity or maintain social relationships becomes significantly limited. Conversation becomes difficult. Pacing back and forth is common, as getting into a comfortable position is nearly impossible. Pain wakes you up from deep sleep. Physical signs will be obvious: pupillary dilation; increased sweating; goosebumps; brisk reflexes; cold, clammy hands and feet; nausea, vomiting or  dry heaves; loss of appetite; significant sleep disturbance with inability to fall asleep or to remain asleep. When persistent, significant weight loss is observed due to the complete loss of appetite and sleep deprivation.  Blood pressure and heart rate becomes significantly elevated. Caution: If elevated blood pressure triggers a pounding headache associated with blurred vision, then the patient should immediately seek attention at an urgent or emergency care unit, as these may be signs of an impending stroke.    Emergency Department Pain Levels (6-10/10)  Emergency Room Pain 6 Severely limiting. Requires emergency care and should not be seen or managed at an outpatient pain management facility. Communication becomes difficult and requires great effort. Assistance to reach the emergency department may be required. Facial flushing and profuse sweating along with potentially dangerous increases in heart rate and blood pressure will be evident.   Distressing pain 7 Self-care is very difficult. Assistance is required to transport, or use restroom. Assistance to reach the emergency department will be required. Tasks requiring coordination, such as bathing and getting dressed become very difficult.   Disabling pain 8 Self-care is no longer possible. At this level, pain is disabling. The individual is unable to do even the most "basic" activities such as walking, eating, bathing, dressing, transferring to a bed, or toileting. Fine motor skills are lost. It is difficult to think clearly.   Incapacitating pain 9 Pain becomes incapacitating. Thought processing is no longer possible. Difficult to remember your own name. Control of movement and coordination are lost.   The worst pain imaginable 10 At this level, most patients pass out from pain. When this level is reached, collapse of the autonomic nervous system occurs, leading to a sudden drop in blood pressure and heart rate. This in turn results in a  temporary and dramatic drop in blood flow to the brain, leading to a loss of consciousness. Fainting is one of the body's self defense mechanisms. Passing out puts the brain in a calmed state and causes it to shut down for a while, in order to begin the healing process.    Summary: 1. Refer to this scale when providing [place] with your pain level. 2. Be accurate and careful when reporting your pain level. This will help with your care. 3. Over-reporting your pain level will lead to loss of credibility. 4. Even a level of 1/10 means that there is pain and will be treated at our facility. 5. High, inaccurate reporting will be documented as "Symptom Exaggeration", leading to loss of credibility and suspicions of possible secondary gains such as obtaining more narcotics, or wanting to appear disabled, for fraudulent reasons. 6. Only pain levels of 5 or below will be seen at our facility. 7. Pain levels of 6 and above will be sent to the Emergency Department and the appointment cancelled. ____________________________________________________________________________________________

## 2018-01-16 NOTE — Patient Instructions (Addendum)
____________________________________________________________________________________________  Medication Rules  Applies to: All patients receiving prescriptions (written or electronic).  Pharmacy of record: Pharmacy where electronic prescriptions will be sent. If written prescriptions are taken to a different pharmacy, please inform the nursing staff. The pharmacy listed in the electronic medical record should be the one where you would like electronic prescriptions to be sent.  Prescription refills: Only during scheduled appointments. Applies to both, written and electronic prescriptions.  NOTE: The following applies primarily to controlled substances (Opioid* Pain Medications).   Patient's responsibilities: 1. Pain Pills: Bring all pain pills to every appointment (except for procedure appointments). 2. Pill Bottles: Bring pills in original pharmacy bottle. Always bring newest bottle. Bring bottle, even if empty. 3. Medication refills: You are responsible for knowing and keeping track of what medications you need refilled. The day before your appointment, write a list of all prescriptions that need to be refilled. Bring that list to your appointment and give it to the admitting nurse. Prescriptions will be written only during appointments. If you forget a medication, it will not be "Called in", "Faxed", or "electronically sent". You will need to get another appointment to get these prescribed. 4. Prescription Accuracy: You are responsible for carefully inspecting your prescriptions before leaving our office. Have the discharge nurse carefully go over each prescription with you, before taking them home. Make sure that your name is accurately spelled, that your address is correct. Check the name and dose of your medication to make sure it is accurate. Check the number of pills, and the written instructions to make sure they are clear and accurate. Make sure that you are given enough medication to last  until your next medication refill appointment. 5. Taking Medication: Take medication as prescribed. Never take more pills than instructed. Never take medication more frequently than prescribed. Taking less pills or less frequently is permitted and encouraged, when it comes to controlled substances (written prescriptions).  6. Inform other Doctors: Always inform, all of your healthcare providers, of all the medications you take. 7. Pain Medication from other Providers: You are not allowed to accept any additional pain medication from any other Doctor or Healthcare provider. There are two exceptions to this rule. (see below) In the event that you require additional pain medication, you are responsible for notifying us, as stated below. 8. Medication Agreement: You are responsible for carefully reading and following our Medication Agreement. This must be signed before receiving any prescriptions from our practice. Safely store a copy of your signed Agreement. Violations to the Agreement will result in no further prescriptions. (Additional copies of our Medication Agreement are available upon request.) 9. Laws, Rules, & Regulations: All patients are expected to follow all Federal and State Laws, Statutes, Rules, & Regulations. Ignorance of the Laws does not constitute a valid excuse. The use of any illegal substances is prohibited. 10. Adopted CDC guidelines & recommendations: Target dosing levels will be at or below 60 MME/day. Use of benzodiazepines** is not recommended.  Exceptions: There are only two exceptions to the rule of not receiving pain medications from other Healthcare Providers. 1. Exception #1 (Emergencies): In the event of an emergency (i.e.: accident requiring emergency care), you are allowed to receive additional pain medication. However, you are responsible for: As soon as you are able, call our office [phone], at any time of the day or night, and leave a message stating your name, the  date and nature of the emergency, and the name and dose of the medication   prescribed. In the event that your call is answered by a member of our staff, make sure to document and save the date, time, and the name of the person that took your information.  2. Exception #2 (Planned Surgery): In the event that you are scheduled by another doctor or dentist to have any type of surgery or procedure, you are allowed (for a period no longer than 30 days), to receive additional pain medication, for the acute post-op pain. However, in this case, you are responsible for picking up a copy of our "Post-op Pain Management for Surgeons" handout, and giving it to your surgeon or dentist. This document is available at our office, and does not require an appointment to obtain it. Simply go to our office during business hours (Monday-Thursday from 8:00 AM to 4:00 PM) (Friday 8:00 AM to 12:00 Noon) or if you have a scheduled appointment with [place], prior to your surgery, and ask for it by name. In addition, you will need to provide [place] with your name, name of your surgeon, type of surgery, and date of procedure or surgery.  *Opioid medications include: morphine, codeine, oxycodone, oxymorphone, hydrocodone, hydromorphone, meperidine, tramadol, tapentadol, buprenorphine, fentanyl, methadone. **Benzodiazepine medications include: diazepam (Valium), alprazolam (Xanax), clonazepam (Klonopine), lorazepam (Ativan), clorazepate (Tranxene), chlordiazepoxide (Librium), estazolam (Prosom), oxazepam (Serax), temazepam (Restoril), triazolam (Halcion) (Last updated: 08/11/2017) ____________________________________________________________________________________________    BMI Assessment: Estimated body mass index is 41.05 kg/m as calculated from the following:   Height as of this encounter: 5\' 8"  (1.727 m).   Weight as of this encounter: 270 lb (122.5 kg).  BMI interpretation table: BMI level Category Range association with higher  incidence of chronic pain  <18 kg/m2 Underweight   18.5-24.9 kg/m2 Ideal body weight   25-29.9 kg/m2 Overweight Increased incidence by 20%  30-34.9 kg/m2 Obese (Class I) Increased incidence by 68%  35-39.9 kg/m2 Severe obesity (Class II) Increased incidence by 136%  >40 kg/m2 Extreme obesity (Class III) Increased incidence by 254%   Patient's current BMI Ideal Body weight  Body mass index is 41.05 kg/m. Ideal body weight: 68.4 kg (150 lb 12.7 oz) Adjusted ideal body weight: 90 kg (198 lb 7.6 oz)   BMI Readings from Last 4 Encounters:  01/16/18 41.05 kg/m  12/21/17 41.05 kg/m  11/23/17 39.87 kg/m  11/02/17 40.61 kg/m   Wt Readings from Last 4 Encounters:  01/16/18 270 lb (122.5 kg)  12/21/17 270 lb (122.5 kg)  11/23/17 270 lb (122.5 kg)  11/02/17 275 lb (124.7 kg)  ____________________________________________________________________________________________  Pain Scale  Introduction: The pain score used by this practice is the Verbal Numerical Rating Scale (VNRS-11). This is an 11-point scale. It is for adults and children 10 years or older. There are significant differences in how the pain score is reported, used, and applied. Forget everything you learned in the past and learn this scoring system.  General Information: The scale should reflect your current level of pain. Unless you are specifically asked for the level of your worst pain, or your average pain. If you are asked for one of these two, then it should be understood that it is over the past 24 hours.  Basic Activities of Daily Living (ADL): Personal hygiene, dressing, eating, transferring, and using restroom.  Instructions: Most patients tend to report their level of pain as a combination of two factors, their physical pain and their psychosocial pain. This last one is also known as "suffering" and it is reflection of how physical pain affects you socially and  psychologically. From now on, report them separately. From  this point on, when asked to report your pain level, report only your physical pain. Use the following table for reference.  Pain Clinic Pain Levels (0-5/10)  Pain Level Score  Description  No Pain 0   Mild pain 1 Nagging, annoying, but does not interfere with basic activities of daily living (ADL). Patients are able to eat, bathe, get dressed, toileting (being able to get on and off the toilet and perform personal hygiene functions), transfer (move in and out of bed or a chair without assistance), and maintain continence (able to control bladder and bowel functions). Blood pressure and heart rate are unaffected. A normal heart rate for a healthy adult ranges from 60 to 100 bpm (beats per minute).   Mild to moderate pain 2 Noticeable and distracting. Impossible to hide from other people. More frequent flare-ups. Still possible to adapt and function close to normal. It can be very annoying and may have occasional stronger flare-ups. With discipline, patients may get used to it and adapt.   Moderate pain 3 Interferes significantly with activities of daily living (ADL). It becomes difficult to feed, bathe, get dressed, get on and off the toilet or to perform personal hygiene functions. Difficult to get in and out of bed or a chair without assistance. Very distracting. With effort, it can be ignored when deeply involved in activities.   Moderately severe pain 4 Impossible to ignore for more than a few minutes. With effort, patients may still be able to manage work or participate in some social activities. Very difficult to concentrate. Signs of autonomic nervous system discharge are evident: dilated pupils (mydriasis); mild sweating (diaphoresis); sleep interference. Heart rate becomes elevated (>115 bpm). Diastolic blood pressure (lower number) rises above 100 mmHg. Patients find relief in laying down and not moving.   Severe pain 5 Intense and extremely unpleasant. Associated with frowning face and  frequent crying. Pain overwhelms the senses.  Ability to do any activity or maintain social relationships becomes significantly limited. Conversation becomes difficult. Pacing back and forth is common, as getting into a comfortable position is nearly impossible. Pain wakes you up from deep sleep. Physical signs will be obvious: pupillary dilation; increased sweating; goosebumps; brisk reflexes; cold, clammy hands and feet; nausea, vomiting or dry heaves; loss of appetite; significant sleep disturbance with inability to fall asleep or to remain asleep. When persistent, significant weight loss is observed due to the complete loss of appetite and sleep deprivation.  Blood pressure and heart rate becomes significantly elevated. Caution: If elevated blood pressure triggers a pounding headache associated with blurred vision, then the patient should immediately seek attention at an urgent or emergency care unit, as these may be signs of an impending stroke.    Emergency Department Pain Levels (6-10/10)  Emergency Room Pain 6 Severely limiting. Requires emergency care and should not be seen or managed at an outpatient pain management facility. Communication becomes difficult and requires great effort. Assistance to reach the emergency department may be required. Facial flushing and profuse sweating along with potentially dangerous increases in heart rate and blood pressure will be evident.   Distressing pain 7 Self-care is very difficult. Assistance is required to transport, or use restroom. Assistance to reach the emergency department will be required. Tasks requiring coordination, such as bathing and getting dressed become very difficult.   Disabling pain 8 Self-care is no longer possible. At this level, pain is disabling. The individual is unable to do  even the most "basic" activities such as walking, eating, bathing, dressing, transferring to a bed, or toileting. Fine motor skills are lost. It is difficult to  think clearly.   Incapacitating pain 9 Pain becomes incapacitating. Thought processing is no longer possible. Difficult to remember your own name. Control of movement and coordination are lost.   The worst pain imaginable 10 At this level, most patients pass out from pain. When this level is reached, collapse of the autonomic nervous system occurs, leading to a sudden drop in blood pressure and heart rate. This in turn results in a temporary and dramatic drop in blood flow to the brain, leading to a loss of consciousness. Fainting is one of the body's self defense mechanisms. Passing out puts the brain in a calmed state and causes it to shut down for a while, in order to begin the healing process.    Summary: 1. Refer to this scale when providing us with your pain level. 2. Be accurate and careful when reporting your pain level. This will help with your care. 3. Over-reporting your pain level will lead to loss of credibility. 4. Even a level of 1/10 means that there is pain and will be treated at our facility. 5. High, inaccurate reporting will be documented as "Symptom Exaggeration", leading to loss of credibility and suspicions of possible secondary gains such as obtaining more narcotics, or wanting to appear disabled, for fraudulent reasons. 6. Only pain levels of 5 or below will be seen at our facility. 7. Pain levels of 6 and above will be sent to the Emergency Department and the appointment cancelled. ____________________________________________________________________________________________

## 2018-01-16 NOTE — Progress Notes (Signed)
Nursing Pain Medication Assessment:  Safety precautions to be maintained throughout the outpatient stay will include: orient to surroundings, keep bed in low position, maintain call bell within reach at all times, provide assistance with transfer out of bed and ambulation.  Medication Inspection Compliance: Pill count conducted under aseptic conditions, in front of the patient. Neither the pills nor the bottle was removed from the patient's sight at any time. Once count was completed pills were immediately returned to the patient in their original bottle.  Medication: Hydrocodone/APAP Pill/Patch Count: 94 of 120 pills remain Pill/Patch Appearance: Markings consistent with prescribed medication Bottle Appearance: Standard pharmacy container. Clearly labeled. Filled Date: 7 / 29 / 2018 Last Medication intake:  Today

## 2018-01-19 LAB — TOXASSURE SELECT 13 (MW), URINE

## 2018-02-21 ENCOUNTER — Telehealth: Payer: Self-pay | Admitting: Family Medicine

## 2018-02-21 NOTE — Telephone Encounter (Signed)
LVM for patient to call back and schedule their AWV. Please schedule when they call back. [email or web address]

## 2018-02-22 ENCOUNTER — Ambulatory Visit: Payer: Medicare Other | Attending: Nurse Practitioner | Admitting: Nurse Practitioner

## 2018-02-22 ENCOUNTER — Encounter: Payer: Self-pay | Admitting: Nurse Practitioner

## 2018-02-22 VITALS — BP 168/109 | HR 72 | Temp 97.7°F | Resp 16 | Ht 68.0 in | Wt 270.0 lb

## 2018-02-22 DIAGNOSIS — I10 Essential (primary) hypertension: Secondary | ICD-10-CM | POA: Diagnosis not present

## 2018-02-22 DIAGNOSIS — E1149 Type 2 diabetes mellitus with other diabetic neurological complication: Secondary | ICD-10-CM | POA: Diagnosis not present

## 2018-02-22 DIAGNOSIS — M48061 Spinal stenosis, lumbar region without neurogenic claudication: Secondary | ICD-10-CM | POA: Diagnosis not present

## 2018-02-22 DIAGNOSIS — E669 Obesity, unspecified: Secondary | ICD-10-CM | POA: Insufficient documentation

## 2018-02-22 DIAGNOSIS — Z794 Long term (current) use of insulin: Secondary | ICD-10-CM | POA: Diagnosis not present

## 2018-02-22 DIAGNOSIS — M5136 Other intervertebral disc degeneration, lumbar region: Secondary | ICD-10-CM | POA: Diagnosis not present

## 2018-02-22 DIAGNOSIS — Z6841 Body Mass Index (BMI) 40.0 and over, adult: Secondary | ICD-10-CM | POA: Diagnosis not present

## 2018-02-22 DIAGNOSIS — M79604 Pain in right leg: Secondary | ICD-10-CM | POA: Diagnosis not present

## 2018-02-22 DIAGNOSIS — M25512 Pain in left shoulder: Secondary | ICD-10-CM

## 2018-02-22 DIAGNOSIS — M5441 Lumbago with sciatica, right side: Secondary | ICD-10-CM | POA: Diagnosis not present

## 2018-02-22 DIAGNOSIS — Z79899 Other long term (current) drug therapy: Secondary | ICD-10-CM | POA: Insufficient documentation

## 2018-02-22 DIAGNOSIS — N529 Male erectile dysfunction, unspecified: Secondary | ICD-10-CM | POA: Insufficient documentation

## 2018-02-22 DIAGNOSIS — Z87891 Personal history of nicotine dependence: Secondary | ICD-10-CM | POA: Diagnosis not present

## 2018-02-22 DIAGNOSIS — M25511 Pain in right shoulder: Secondary | ICD-10-CM | POA: Diagnosis not present

## 2018-02-22 DIAGNOSIS — E781 Pure hyperglyceridemia: Secondary | ICD-10-CM | POA: Insufficient documentation

## 2018-02-22 DIAGNOSIS — G4733 Obstructive sleep apnea (adult) (pediatric): Secondary | ICD-10-CM | POA: Insufficient documentation

## 2018-02-22 DIAGNOSIS — M545 Low back pain: Secondary | ICD-10-CM | POA: Diagnosis present

## 2018-02-22 DIAGNOSIS — Z79891 Long term (current) use of opiate analgesic: Secondary | ICD-10-CM | POA: Diagnosis not present

## 2018-02-22 DIAGNOSIS — M79605 Pain in left leg: Secondary | ICD-10-CM

## 2018-02-22 DIAGNOSIS — Z7982 Long term (current) use of aspirin: Secondary | ICD-10-CM | POA: Diagnosis not present

## 2018-02-22 DIAGNOSIS — G8929 Other chronic pain: Secondary | ICD-10-CM

## 2018-02-22 DIAGNOSIS — M5442 Lumbago with sciatica, left side: Secondary | ICD-10-CM

## 2018-02-22 DIAGNOSIS — G894 Chronic pain syndrome: Secondary | ICD-10-CM

## 2018-02-22 MED ORDER — LIDOCAINE 5 % EX OINT: 1.0000 "application " | TOPICAL_OINTMENT | CUTANEOUS | 0 refills | Status: DC | PRN

## 2018-02-22 MED ORDER — HYDROCODONE-ACETAMINOPHEN 10-325 MG PO TABS: 1.0000 | ORAL_TABLET | Freq: Four times a day (QID) | ORAL | 0 refills | Status: DC | PRN

## 2018-02-22 MED ORDER — DULOXETINE HCL 30 MG PO CPEP: 60.0000 mg | ORAL_CAPSULE | Freq: Every day | ORAL | 0 refills | Status: DC

## 2018-02-22 NOTE — Progress Notes (Addendum)
Patient's Name: [name]  MRN: [id number]  Referring Provider: Guadalupe Dawn, MD  DOB: 04-29-1966  PCP: Guadalupe Dawn, MD  DOS: 02/22/2018  Note by: Vevelyn Francois NP  Service setting: Ambulatory outpatient  Specialty: Interventional Pain Management  Location: [facility] (AMB) Pain Management Facility    Patient type: Established    Primary Reason(s) for Visit: Encounter for prescription drug management. (Level of risk: moderate)  CC: Back Pain (lower back left is worse ); Foot Pain (bilateral); and Leg Pain (bilateral )  HPI  [name] is a 52 y.o. year old, male patient, who comes today for a medication management evaluation. He has HYPERTRIGLYCERIDEMIA, SEVERE; OBESITY, NOS; HYPERTENSION, BENIGN SYSTEMIC; Recurrent boils; Pain in joint; Lumbosacral neuritis; DISTURBANCE OF SKIN SENSATION; LEG EDEMA, BILATERAL; TACHYCARDIA; TRANSAMINASES, SERUM, ELEVATED; Venous stasis; Heel callus; Erectile dysfunction; OSA (obstructive sleep apnea); Type 2 diabetes mellitus with neurologic complication ([place]); Skin tag; Seborrheic dermatitis of scalp; Chronic pain of both lower extremities (L>R); Bilateral shoulder pain; Tobacco dependence in remission; Other malaise and fatigue; Difficulty concentrating; Weight loss counseling, encounter for; Flank pain, acute; Pilonidal disease; Chest pain; Chronic bilateral low back pain with bilateral sciatica; Chronic pain syndrome; and Long term current use of opiate analgesic on their problem list. His primarily concern today is the Back Pain (lower back left is worse ); Foot Pain (bilateral); and Leg Pain (bilateral )  Pain Assessment: Location: Left Back Radiating: into legs and feet, left is worse  Onset: More than a month ago Duration: Chronic pain Quality: Discomfort, Sharp, Dull, Shooting, Pressure, Constant, Throbbing Severity: 4 /10 (subjective, self-reported pain score)  Note: Reported level is compatible with observation.                           Effect on ADL: limiting  Timing: Constant Modifying factors: medications, repositioning BP: (!) 168/109  HR: 72  [name] was last scheduled for an appointment on 01/16/2018 for medication management. During today's appointment we reviewed [name] chronic pain status, as well as his outpatient medication regimen. He admits that his pain has not changed. He denies any leg pain on the right. He has weakness in his left leg but no falls. He has constant tingling. He admits that he walked more on last night than normal.  The patient  reports that he does not use drugs. His body mass index is 41.05 kg/m.  Further details on both, my assessment(s), as well as the proposed treatment plan, please see below.  Controlled Substance Pharmacotherapy Assessment REMS (Risk Evaluation and Mitigation Strategy)  Analgesic:Hydrocodone 10 mg 4 times daily as needed, quantity 46-monthMME/day:480mday.  PaJanett BillowRN  02/22/2018  8:54 AM  Signed Nursing Pain Medication Assessment:  Safety precautions to be maintained throughout the outpatient stay will include: orient to surroundings, keep bed in low position, maintain call bell within reach at all times, provide assistance with transfer out of bed and ambulation.  Medication Inspection Compliance: Pill count conducted under aseptic conditions, in front of the patient. Neither the pills nor the bottle was removed from the patient's sight at any time. Once count was completed pills were immediately returned to the patient in their original bottle.  Medication: Hydrocodone/APAP Pill/Patch Count: 68 of 120 pills remain Pill/Patch Appearance: Markings consistent with prescribed medication Bottle Appearance: Standard pharmacy container. Clearly labeled. Filled Date: 08 / 29 / 2019 Last Medication intake:  Today   Pharmacokinetics: Liberation and absorption (onset of  action): WNL Distribution (time to peak effect): WNL Metabolism and  excretion (duration of action): WNL         Pharmacodynamics: Desired effects: Analgesia: [name] reports >50% benefit. Functional ability: Patient reports that medication allows him to accomplish basic ADLs Clinically meaningful improvement in function (CMIF): Sustained CMIF goals met Perceived effectiveness: Described as relatively effective, allowing for increase in activities of daily living (ADL) Undesirable effects: Side-effects or Adverse reactions: None reported Monitoring: [name] PMP: Online review of the past 30-monthperiod conducted. Compliant with practice rules and regulations Last UDS on record: Summary  Date Value Ref Range Status  01/16/2018 FINAL  Final    Comment:    ==================================================================== TOXASSURE SELECT 13 (MW) ==================================================================== Test                             Result       Flag       Units Drug Present and Declared for Prescription Verification   Hydrocodone                    1013         EXPECTED   ng/mg creat   Norhydrocodone                 565          EXPECTED   ng/mg creat    Sources of hydrocodone include scheduled prescription    medications. Norhydrocodone is an expected metabolite of    hydrocodone. ==================================================================== Test                      Result    Flag   Units      Ref Range   Creatinine              23               mg/dL      >=20 ==================================================================== Declared Medications:  The flagging and interpretation on this report are based on the  following declared medications.  Unexpected results may arise from  inaccuracies in the declared medications.  **Note: The testing scope of this panel includes these medications:  Hydrocodone (Norco)  **Note: The testing scope of this panel does not include following  reported medications:  Acetaminophen  (Norco)  Aspirin (Aspirin 81)  Duloxetine (Cymbalta)  Insulin (Humalog)  Insulin (Lantus)  Multivitamin  Topical Lidocaine  Triamcinolone acetonide ==================================================================== For clinical consultation, please call ([phone] ====================================================================    UDS interpretation: Compliant          Medication Assessment Form: Reviewed. Patient indicates being compliant with therapy Treatment compliance: Compliant Risk Assessment Profile: Aberrant behavior: See prior evaluations. None observed or detected today Comorbid factors increasing risk of overdose: See prior notes. No additional risks detected today Opioid risk tool (ORT) (Total Score): 0 Personal History of Substance Abuse (SUD-Substance use disorder):  Alcohol: Negative  Illegal Drugs: Negative  Rx Drugs: Negative  ORT Risk Level calculation: Low Risk Risk of substance use disorder (SUD): Low Opioid Risk Tool - 02/22/18 0821      Family History of Substance Abuse   Alcohol  Negative    Illegal Drugs  Negative    Rx Drugs  Negative      Personal History of Substance Abuse   Alcohol  Negative    Illegal Drugs  Negative    Rx Drugs  Negative  Psychological Disease   Psychological Disease  Negative    Depression  Negative      Total Score   Opioid Risk Tool Scoring  0    Opioid Risk Interpretation  Low Risk      ORT Scoring interpretation table:  Score <3 = Low Risk for SUD  Score between 4-7 = Moderate Risk for SUD  Score >8 = High Risk for Opioid Abuse   Risk Mitigation Strategies:  Patient Counseling: Covered Patient-Prescriber Agreement (PPA): Present and active  Notification to other healthcare providers: Done  Pharmacologic Plan: No change in therapy, at this time.             Laboratory Chemistry  Inflammation Markers (CRP: Acute Phase) (ESR: Chronic Phase) Lab Results  Component Value Date   ESRSEDRATE 82  (H) 04/09/2008                         Rheumatology Markers Lab Results  Component Value Date   RF < 20 04/04/2008   LABURIC 5.7 04/04/2008                        Renal Function Markers Lab Results  Component Value Date   BUN 15 09/20/2016   CREATININE 0.92 09/20/2016   BCR 16 09/20/2016   GFRAA 113 09/20/2016   GFRNONAA 98 09/20/2016                             Hepatic Function Markers Lab Results  Component Value Date   AST 22 03/25/2015   ALT 25 03/25/2015   ALBUMIN 4.1 03/25/2015   ALKPHOS 65 03/25/2015   HCVAB NEG 04/02/2008   LIPASE 24 03/25/2015                        Electrolytes Lab Results  Component Value Date   NA 140 09/20/2016   K 4.7 09/20/2016   CL 99 09/20/2016   CALCIUM 9.9 09/20/2016   MG 2.0 09/20/2016                        Neuropathy Markers Lab Results  Component Value Date   VITAMINB12 1199 (H) 06/17/2009   FOLATE >20.0 ng/mL 06/17/2009   HGBA1C 10.1 09/22/2017                        CNS Tests Lab Results  Component Value Date   SDES ABSCESS BUTTOCKS LEFT 10/31/2008   GRAMSTAIN  10/31/2008    FEW WBC PRESENT, PREDOMINANTLY PMN RARE SQUAMOUS EPITHELIAL CELLS PRESENT RARE GRAM POSITIVE COCCI IN PAIRS   CULT NO GROWTH 3 DAYS 10/31/2008                        Bone Pathology Markers Lab Results  Component Value Date   TESTOSTERONE 174 (L) 05/16/2017                         Coagulation Parameters Lab Results  Component Value Date   PLT 224 05/16/2017   DDIMER (H) 04/04/2008    0.54        AT THE INHOUSE ESTABLISHED CUTOFF VALUE OF 0.48 ug/mL FEU, THIS ASSAY HAS BEEN DOCUMENTED IN THE LITERATURE TO HAVE  Cardiovascular Markers Lab Results  Component Value Date   CKTOTAL 106 04/04/2008   CKMB 4.1 (H) 04/04/2008   TROPONINI 0.01        NO INDICATION OF MYOCARDIAL INJURY. 04/04/2008   HGB 14.5 05/16/2017   HCT 42.0 05/16/2017                         CA Markers No results found for: CEA,  CA125, LABCA2                      Note: Lab results reviewed.  Recent Diagnostic Imaging Results  MR LUMBAR SPINE WO CONTRAST CLINICAL DATA:  Low back pain  EXAM: MRI LUMBAR SPINE WITHOUT CONTRAST  TECHNIQUE: Multiplanar, multisequence MR imaging of the lumbar spine was performed. No intravenous contrast was administered.  COMPARISON:  Lumbar MRI 02/11/2015  FINDINGS: Segmentation:  Normal  Alignment:  Normal  Vertebrae: Hemangioma L1 vertebral body. Negative for fracture or mass. Discogenic changes at L4-5  Conus medullaris: Extends to the L1 level and appears normal.  Paraspinal and other soft tissues: Negative  Disc levels:  T12-L1: Broad-based central disc protrusion unchanged. No significant stenosis  L1-2:  Negative  L2-3:  Negative  L3-4: Disc degeneration with disc bulging and mild endplate spurring. No significant stenosis.  L4-5: Postop laminectomy on the left. Disc degeneration and diffuse endplate spurring. Bilateral mild facet hypertrophy. Mild spinal stenosis, unchanged. Mild subarticular and foraminal stenosis due to spurring unchanged  L5-S1:  Negative  IMPRESSION: Disc bulging L3-4 unchanged  Postop laminectomy left L4-5. Disc degeneration and spurring. Mild spinal stenosis unchanged. Mild subarticular and foraminal stenosis unchanged  Overall, no change from the prior MRI of 02/11/2015  Electronically Signed   By: Franchot Gallo M.D.   On: 12/03/2016 09:16  Complexity Note: Imaging results reviewed. Results shared with Mr. Harn, using Layman's terms.                         Meds   Current Outpatient Medications:  .  aspirin 81 MG tablet, Take 81 mg by mouth daily., Disp: , Rfl:  .  [START ON 03/12/2018] DULoxetine (CYMBALTA) 30 MG capsule, Take 2 capsules (60 mg total) by mouth daily., Disp: 60 capsule, Rfl: 0 .  [START ON 03/11/2018] HYDROcodone-acetaminophen (NORCO) 10-325 MG tablet, Take 1 tablet by mouth every 6 (six) hours  as needed for severe pain., Disp: 120 tablet, Rfl: 0 .  insulin glargine (LANTUS) 100 UNIT/ML injection, INJECT up to 50 U into the skin daily., Disp: 20 mL, Rfl: 5 .  insulin lispro (HUMALOG) 100 UNIT/ML injection, Inject 0.2 mLs (20 Units total) into the skin 3 (three) times daily before meals., Disp: 20 mL, Rfl: 5 .  [START ON 03/12/2018] lidocaine (XYLOCAINE) 5 % ointment, Apply 1 application topically as needed., Disp: 2500 g, Rfl: 0 .  Multiple Vitamin (MULTIVITAMIN WITH MINERALS) TABS tablet, Take 1 tablet by mouth daily., Disp: , Rfl:  .  SYRINGE-NEEDLE, DISP, 3 ML (B-D SYRINGE/NEEDLE 3CC/25GX5/8) 25G X 5/8" 3 ML MISC, Use to administer insulin four times per day., Disp: 100 each, Rfl: 12 .  triamcinolone cream (KENALOG) 0.1 %, Apply 1 application 2 (two) times daily topically. As needed. Avoid excessive use on face, Disp: 30 g, Rfl: 0  ROS  Constitutional: Denies any fever or chills Gastrointestinal: No reported hemesis, hematochezia, vomiting, or acute GI distress Musculoskeletal: Denies any acute onset joint swelling, redness,  loss of ROM, or weakness Neurological: No reported episodes of acute onset apraxia, aphasia, dysarthria, agnosia, amnesia, paralysis, loss of coordination, or loss of consciousness  Allergies  Mr. Coone is allergic to penicillins; metformin and related; and statins.  PFSH  Drug: Mr. Sharps  reports that he does not use drugs. Alcohol:  reports that he drinks alcohol. Tobacco:  reports that he quit smoking about 6 years ago. His smoking use included cigarettes. He has a 48.00 pack-year smoking history. He quit smokeless tobacco use about 6 years ago.  His smokeless tobacco use included snuff. Medical:  has a past medical history of Arthritis, Bilateral edema of lower extremity, Bulge of cervical disc without myelopathy, Chronic low back pain, Disorder of subcutaneous tissue, Environmental allergies, History of cervical fracture, History of multiple concussions,  History of pneumothorax, History of seizures (pt states takes gabapentin to control seizures and for neuropathy---  followed by PCP  dr  Lorenso Courier ([facility])  seizures are note mentioned as hx in her last note or previous notes), Hypertension, OSA on CPAP, Peripheral neuropathy, Recurrent boils, Type 2 diabetes mellitus ([place]), and Wears glasses. Surgical: Mr. Schrack  has a past surgical history that includes I & D RIGHT INDEX FINGER PURULENT FLEXOR TENOSYNOVITIS (08-23-2001); Lumbar disc surgery (02-08-2002); transthoracic echocardiogram (05-03-2007); REATTACHMENT LEFT INDEX FINGER (1999); Appendectomy (age 35); and Mass excision (N/A, 08/07/2015). Family: family history includes Asthma in his maternal aunt, maternal grandfather, maternal grandmother, maternal uncle, and mother; COPD in his mother; Diabetes in his maternal aunt, maternal grandfather, maternal grandmother, maternal uncle, and mother; Obesity in his maternal aunt, maternal grandfather, maternal grandmother, maternal uncle, and mother.  Constitutional Exam  General appearance: Well nourished, well developed, and well hydrated. In no apparent acute distress Vitals:   02/22/18 0816 02/22/18 0828 02/22/18 0851  BP: (!) 170/109 (!) 178/110 (!) 168/109  Pulse: 72    Resp: 16    Temp: 97.7 F (36.5 C)    TempSrc: Oral    SpO2: 98%    Weight: 270 lb (122.5 kg)    Height: 5' 8"  (1.727 m)    Psych/Mental status: Alert, oriented x 3 (person, place, & time)       Eyes: PERLA Respiratory: No evidence of acute respiratory distress   Lumbar Spine Area Exam  Skin & Axial Inspection: Well healed scar from previous spine surgery detected Alignment: Symmetrical Functional ROM: Unrestricted ROM       Stability: No instability detected Muscle Tone/Strength: Functionally intact. No obvious neuro-muscular anomalies detected. Sensory (Neurological): Unimpaired Palpation: Complains of area being tender to palpation       Provocative  Tests: Hyperextension/rotation test: deferred today       Lumbar quadrant test (Kemp's test): deferred today       Lateral bending test: deferred today       Patrick's Maneuver: deferred today                   FABER test: deferred today                   S-I anterior distraction/compression test: deferred today         S-I lateral compression test: deferred today         S-I Thigh-thrust test: deferred today         S-I Gaenslen's test: deferred today          Gait & Posture Assessment  Ambulation: Patient ambulates using a cane Gait: Relatively normal for  age and body habitus Posture: WNL   Lower Extremity Exam    Side: Right lower extremity  Side: Left lower extremity  Stability: No instability observed          Stability: No instability observed          Skin & Extremity Inspection: Skin color, temperature, and hair growth are WNL. No peripheral edema or cyanosis. No masses, redness, swelling, asymmetry, or associated skin lesions. No contractures.  Skin & Extremity Inspection: Skin color, temperature, and hair growth are WNL. No peripheral edema or cyanosis. No masses, redness, swelling, asymmetry, or associated skin lesions. No contractures.  Functional ROM: Unrestricted ROM                  Functional ROM: Unrestricted ROM                  Muscle Tone/Strength: Functionally intact. No obvious neuro-muscular anomalies detected.  Muscle Tone/Strength: Functionally intact. No obvious neuro-muscular anomalies detected.  Sensory (Neurological): Paresthesia (Tingling sensation)  Sensory (Neurological): Paresthesia (Tingling sensation)  Palpation: No palpable anomalies  Palpation: No palpable anomalies   Assessment  Primary Diagnosis & Pertinent Problem List: The primary encounter diagnosis was Chronic bilateral low back pain with bilateral sciatica. Diagnoses of Chronic pain of both lower extremities (L>R), Bilateral shoulder pain, and Chronic pain syndrome were also pertinent to this  visit.  Status Diagnosis  Persistent Persistent Controlled 1. Chronic bilateral low back pain with bilateral sciatica   2. Chronic pain of both lower extremities (L>R)   3. Bilateral shoulder pain   4. Chronic pain syndrome     Problems updated and reviewed during this visit: No problems updated. Plan of Care  Pharmacotherapy (Medications Ordered): Meds ordered this encounter  Medications  . HYDROcodone-acetaminophen (NORCO) 10-325 MG tablet    Sig: Take 1 tablet by mouth every 6 (six) hours as needed for severe pain.    Dispense:  120 tablet    Refill:  0    Do not place this medication on "Automatic Refill". Patient may have prescription filled one day early if pharmacy is closed on scheduled refill date. May fill on: 03/11/2018    Order Specific Question:   Supervising Provider    Answer:   Milinda Pointer [phone]  . DULoxetine (CYMBALTA) 30 MG capsule    Sig: Take 2 capsules (60 mg total) by mouth daily.    Dispense:  60 capsule    Refill:  0    Order Specific Question:   Supervising Provider    Answer:   Milinda Pointer [phone]  . lidocaine (XYLOCAINE) 5 % ointment    Sig: Apply 1 application topically as needed.    Dispense:  2500 g    Refill:  0    Order Specific Question:   Supervising Provider    AnswerMilinda Pointer [phone]   New Prescriptions   No medications on file   Medications administered today: [place] C. Vangorder had no medications administered during this visit. Lab-work, procedure(s), and/or referral(s): No orders of the defined types were placed in this encounter.  Imaging and/or referral(s): None  Interventional therapies: Planned, scheduled, and/or pending:   Not at this time.    Provider-requested follow-up: Return in about 6 weeks (around 04/05/2018) for MedMgmt with Me Donella Stade Edison Pace).  Future Appointments  Date Time Provider [place]  04/05/2018  8:45 AM Vevelyn Francois, NP [facility] None   Primary Care  Physician: Guadalupe Dawn, MD Location: [facility] Note by: Vevelyn Francois NP Date: 02/22/2018; Time: 9:05 AM  Pain Score Disclaimer: We use the NRS-11 scale. This is a self-reported, subjective measurement of pain severity with only modest accuracy. It is used primarily to identify changes within a particular patient. It must be understood that outpatient pain scales are significantly less accurate that those used for research, where they can be applied under ideal controlled circumstances with minimal exposure to variables. In reality, the score is likely to be a combination of pain intensity and pain affect, where pain affect describes the degree of emotional arousal or changes in action readiness caused by the sensory experience of pain. Factors such as social and work situation, setting, emotional state, anxiety levels, expectation, and prior pain experience may influence pain perception and show large inter-individual differences that may also be affected by time variables.  Patient instructions provided during this appointment: Patient Instructions   ____________________________________________________________________________________________  Medication Rules  Applies to: All patients receiving prescriptions (written or electronic).  Pharmacy of record: Pharmacy where electronic prescriptions will be sent. If written prescriptions are taken to a different pharmacy, please inform the nursing staff. The pharmacy listed in the electronic medical record should be the one where you would like electronic prescriptions to be sent.  Prescription refills: Only during scheduled appointments. Applies to both, written and electronic prescriptions.  NOTE: The following applies primarily to controlled substances (Opioid* Pain Medications).   Patient's responsibilities: 1. Pain Pills: Bring all pain pills to every appointment (except for procedure  appointments). 2. Pill Bottles: Bring pills in original pharmacy bottle. Always bring newest bottle. Bring bottle, even if empty. 3. Medication refills: You are responsible for knowing and keeping track of what medications you need refilled. The day before your appointment, write a list of all prescriptions that need to be refilled. Bring that list to your appointment and give it to the admitting nurse. Prescriptions will be written only during appointments. If you forget a medication, it will not be "Called in", "Faxed", or "electronically sent". You will need to get another appointment to get these prescribed. 4. Prescription Accuracy: You are responsible for carefully inspecting your prescriptions before leaving our office. Have the discharge nurse carefully go over each prescription with you, before taking them home. Make sure that your name is accurately spelled, that your address is correct. Check the name and dose of your medication to make sure it is accurate. Check the number of pills, and the written instructions to make sure they are clear and accurate. Make sure that you are given enough medication to last until your next medication refill appointment. 5. Taking Medication: Take medication as prescribed. Never take more pills than instructed. Never take medication more frequently than prescribed. Taking less pills or less frequently is permitted and encouraged, when it comes to controlled substances (written prescriptions).  6. Inform other Doctors: Always inform, all of your healthcare providers, of all the medications you take. 7. Pain Medication from other Providers: You are not allowed to accept any additional pain medication from any other Doctor or Healthcare provider. There are two exceptions to this rule. (see below) In the event that you require additional pain medication, you are responsible for notifying us, as stated below. 8. Medication Agreement: You are responsible for carefully  reading and following our Medication Agreement. This must be signed before receiving any prescriptions from our practice. Safely store a copy of your signed Agreement. Violations to the Agreement will result in no further  prescriptions. (Additional copies of our Medication Agreement are available upon request.) 9. Laws, Rules, & Regulations: All patients are expected to follow all Federal and [employer], [employer], Rules, [employer]. Ignorance of the Laws does not constitute a valid excuse. The use of any illegal substances is prohibited. 10. Adopted CDC guidelines & recommendations: Target dosing levels will be at or below 60 MME/day. Use of benzodiazepines** is not recommended.  Exceptions: There are only two exceptions to the rule of not receiving pain medications from other Healthcare Providers. 1. Exception #1 (Emergencies): In the event of an emergency (i.e.: accident requiring emergency care), you are allowed to receive additional pain medication. However, you are responsible for: As soon as you are able, call our office [phone], at any time of the day or night, and leave a message stating your name, the date and nature of the emergency, and the name and dose of the medication prescribed. In the event that your call is answered by a member of our staff, make sure to document and save the date, time, and the name of the person that took your information.  2. Exception #2 (Planned Surgery): In the event that you are scheduled by another doctor or dentist to have any type of surgery or procedure, you are allowed (for a period no longer than 30 days), to receive additional pain medication, for the acute post-op pain. However, in this case, you are responsible for picking up a copy of our "Post-op Pain Management for Surgeons" handout, and giving it to your surgeon or dentist. This document is available at our office, and does not require an appointment to obtain it. Simply go to our office during  business hours (Monday-Thursday from 8:00 AM to 4:00 PM) (Friday 8:00 AM to 12:00 Noon) or if you have a scheduled appointment with [place], prior to your surgery, and ask for it by name. In addition, you will need to provide [place] with your name, name of your surgeon, type of surgery, and date of procedure or surgery.  *Opioid medications include: morphine, codeine, oxycodone, oxymorphone, hydrocodone, hydromorphone, meperidine, tramadol, tapentadol, buprenorphine, fentanyl, methadone. **Benzodiazepine medications include: diazepam (Valium), alprazolam (Xanax), clonazepam (Klonopine), lorazepam (Ativan), clorazepate (Tranxene), chlordiazepoxide (Librium), estazolam (Prosom), oxazepam (Serax), temazepam (Restoril), triazolam (Halcion) (Last updated: 08/11/2017) ____________________________________________________________________________________________   BMI Assessment: Estimated body mass index is 41.05 kg/m as calculated from the following:   Height as of this encounter: 5' 8"  (1.727 m).   Weight as of this encounter: 270 lb (122.5 kg).  BMI interpretation table: BMI level Category Range association with higher incidence of chronic pain  <18 kg/m2 Underweight   18.5-24.9 kg/m2 Ideal body weight   25-29.9 kg/m2 Overweight Increased incidence by 20%  30-34.9 kg/m2 Obese (Class I) Increased incidence by 68%  35-39.9 kg/m2 Severe obesity (Class II) Increased incidence by 136%  >40 kg/m2 Extreme obesity (Class III) Increased incidence by 254%   Patient's current BMI Ideal Body weight  Body mass index is 41.05 kg/m. Ideal body weight: 68.4 kg (150 lb 12.7 oz) Adjusted ideal body weight: 90 kg (198 lb 7.6 oz)   BMI Readings from Last 4 Encounters:  02/22/18 41.05 kg/m  01/16/18 41.05 kg/m  12/21/17 41.05 kg/m  11/23/17 39.87 kg/m   Wt Readings from Last 4 Encounters:  02/22/18 270 lb (122.5 kg)  01/16/18 270 lb (122.5 kg)  12/21/17 270 lb (122.5 kg)  11/23/17 270 lb (122.5 kg)     Cymbalta 30 mg , hydrocodone - apap 10-325 and  lidocaine ointment 5% escribed to your pharmacy

## 2018-02-22 NOTE — Patient Instructions (Addendum)
____________________________________________________________________________________________  Medication Rules  Applies to: All patients receiving prescriptions (written or electronic).  Pharmacy of record: Pharmacy where electronic prescriptions will be sent. If written prescriptions are taken to a different pharmacy, please inform the nursing staff. The pharmacy listed in the electronic medical record should be the one where you would like electronic prescriptions to be sent.  Prescription refills: Only during scheduled appointments. Applies to both, written and electronic prescriptions.  NOTE: The following applies primarily to controlled substances (Opioid* Pain Medications).   Patient's responsibilities: 1. Pain Pills: Bring all pain pills to every appointment (except for procedure appointments). 2. Pill Bottles: Bring pills in original pharmacy bottle. Always bring newest bottle. Bring bottle, even if empty. 3. Medication refills: You are responsible for knowing and keeping track of what medications you need refilled. The day before your appointment, write a list of all prescriptions that need to be refilled. Bring that list to your appointment and give it to the admitting nurse. Prescriptions will be written only during appointments. If you forget a medication, it will not be "Called in", "Faxed", or "electronically sent". You will need to get another appointment to get these prescribed. 4. Prescription Accuracy: You are responsible for carefully inspecting your prescriptions before leaving our office. Have the discharge nurse carefully go over each prescription with you, before taking them home. Make sure that your name is accurately spelled, that your address is correct. Check the name and dose of your medication to make sure it is accurate. Check the number of pills, and the written instructions to make sure they are clear and accurate. Make sure that you are given enough medication to last  until your next medication refill appointment. 5. Taking Medication: Take medication as prescribed. Never take more pills than instructed. Never take medication more frequently than prescribed. Taking less pills or less frequently is permitted and encouraged, when it comes to controlled substances (written prescriptions).  6. Inform other Doctors: Always inform, all of your healthcare providers, of all the medications you take. 7. Pain Medication from other Providers: You are not allowed to accept any additional pain medication from any other Doctor or Healthcare provider. There are two exceptions to this rule. (see below) In the event that you require additional pain medication, you are responsible for notifying us, as stated below. 8. Medication Agreement: You are responsible for carefully reading and following our Medication Agreement. This must be signed before receiving any prescriptions from our practice. Safely store a copy of your signed Agreement. Violations to the Agreement will result in no further prescriptions. (Additional copies of our Medication Agreement are available upon request.) 9. Laws, Rules, & Regulations: All patients are expected to follow all Federal and State Laws, Statutes, Rules, & Regulations. Ignorance of the Laws does not constitute a valid excuse. The use of any illegal substances is prohibited. 10. Adopted CDC guidelines & recommendations: Target dosing levels will be at or below 60 MME/day. Use of benzodiazepines** is not recommended.  Exceptions: There are only two exceptions to the rule of not receiving pain medications from other Healthcare Providers. 1. Exception #1 (Emergencies): In the event of an emergency (i.e.: accident requiring emergency care), you are allowed to receive additional pain medication. However, you are responsible for: As soon as you are able, call our office [phone], at any time of the day or night, and leave a message stating your name, the  date and nature of the emergency, and the name and dose of the medication   prescribed. In the event that your call is answered by a member of our staff, make sure to document and save the date, time, and the name of the person that took your information.  2. Exception #2 (Planned Surgery): In the event that you are scheduled by another doctor or dentist to have any type of surgery or procedure, you are allowed (for a period no longer than 30 days), to receive additional pain medication, for the acute post-op pain. However, in this case, you are responsible for picking up a copy of our "Post-op Pain Management for Surgeons" handout, and giving it to your surgeon or dentist. This document is available at our office, and does not require an appointment to obtain it. Simply go to our office during business hours (Monday-Thursday from 8:00 AM to 4:00 PM) (Friday 8:00 AM to 12:00 Noon) or if you have a scheduled appointment with [place], prior to your surgery, and ask for it by name. In addition, you will need to provide [place] with your name, name of your surgeon, type of surgery, and date of procedure or surgery.  *Opioid medications include: morphine, codeine, oxycodone, oxymorphone, hydrocodone, hydromorphone, meperidine, tramadol, tapentadol, buprenorphine, fentanyl, methadone. **Benzodiazepine medications include: diazepam (Valium), alprazolam (Xanax), clonazepam (Klonopine), lorazepam (Ativan), clorazepate (Tranxene), chlordiazepoxide (Librium), estazolam (Prosom), oxazepam (Serax), temazepam (Restoril), triazolam (Halcion) (Last updated: 08/11/2017) ____________________________________________________________________________________________   BMI Assessment: Estimated body mass index is 41.05 kg/m as calculated from the following:   Height as of this encounter: 5\' 8"  (1.727 m).   Weight as of this encounter: 270 lb (122.5 kg).  BMI interpretation table: BMI level Category Range association with higher  incidence of chronic pain  <18 kg/m2 Underweight   18.5-24.9 kg/m2 Ideal body weight   25-29.9 kg/m2 Overweight Increased incidence by 20%  30-34.9 kg/m2 Obese (Class I) Increased incidence by 68%  35-39.9 kg/m2 Severe obesity (Class II) Increased incidence by 136%  >40 kg/m2 Extreme obesity (Class III) Increased incidence by 254%   Patient's current BMI Ideal Body weight  Body mass index is 41.05 kg/m. Ideal body weight: 68.4 kg (150 lb 12.7 oz) Adjusted ideal body weight: 90 kg (198 lb 7.6 oz)   BMI Readings from Last 4 Encounters:  02/22/18 41.05 kg/m  01/16/18 41.05 kg/m  12/21/17 41.05 kg/m  11/23/17 39.87 kg/m   Wt Readings from Last 4 Encounters:  02/22/18 270 lb (122.5 kg)  01/16/18 270 lb (122.5 kg)  12/21/17 270 lb (122.5 kg)  11/23/17 270 lb (122.5 kg)    Cymbalta 30 mg , hydrocodone - apap 10-325 and lidocaine ointment 5% escribed to your pharmacy

## 2018-02-22 NOTE — Progress Notes (Signed)
Nursing Pain Medication Assessment:  Safety precautions to be maintained throughout the outpatient stay will include: orient to surroundings, keep bed in low position, maintain call bell within reach at all times, provide assistance with transfer out of bed and ambulation.  Medication Inspection Compliance: Pill count conducted under aseptic conditions, in front of the patient. Neither the pills nor the bottle was removed from the patient's sight at any time. Once count was completed pills were immediately returned to the patient in their original bottle.  Medication: Hydrocodone/APAP Pill/Patch Count: 68 of 120 pills remain Pill/Patch Appearance: Markings consistent with prescribed medication Bottle Appearance: Standard pharmacy container. Clearly labeled. Filled Date: 08 / 29 / 2019 Last Medication intake:  Today

## 2018-03-07 ENCOUNTER — Other Ambulatory Visit: Payer: Self-pay | Admitting: *Deleted

## 2018-03-08 MED ORDER — INSULIN LISPRO 100 UNIT/ML ~~LOC~~ SOLN: 20.0000 [IU] | Freq: Three times a day (TID) | SUBCUTANEOUS | 1 refills | Status: DC

## 2018-04-05 ENCOUNTER — Ambulatory Visit: Payer: Medicare Other | Attending: Nurse Practitioner | Admitting: Nurse Practitioner

## 2018-04-05 ENCOUNTER — Encounter: Payer: Self-pay | Admitting: Nurse Practitioner

## 2018-04-05 DIAGNOSIS — E781 Pure hyperglyceridemia: Secondary | ICD-10-CM | POA: Insufficient documentation

## 2018-04-05 DIAGNOSIS — Z7982 Long term (current) use of aspirin: Secondary | ICD-10-CM | POA: Diagnosis not present

## 2018-04-05 DIAGNOSIS — G894 Chronic pain syndrome: Secondary | ICD-10-CM | POA: Insufficient documentation

## 2018-04-05 DIAGNOSIS — G4733 Obstructive sleep apnea (adult) (pediatric): Secondary | ICD-10-CM | POA: Insufficient documentation

## 2018-04-05 DIAGNOSIS — M79604 Pain in right leg: Secondary | ICD-10-CM | POA: Diagnosis not present

## 2018-04-05 DIAGNOSIS — N529 Male erectile dysfunction, unspecified: Secondary | ICD-10-CM | POA: Diagnosis not present

## 2018-04-05 DIAGNOSIS — Z888 Allergy status to other drugs, medicaments and biological substances status: Secondary | ICD-10-CM | POA: Diagnosis not present

## 2018-04-05 DIAGNOSIS — Z6841 Body Mass Index (BMI) 40.0 and over, adult: Secondary | ICD-10-CM | POA: Insufficient documentation

## 2018-04-05 DIAGNOSIS — M5417 Radiculopathy, lumbosacral region: Secondary | ICD-10-CM | POA: Insufficient documentation

## 2018-04-05 DIAGNOSIS — E1140 Type 2 diabetes mellitus with diabetic neuropathy, unspecified: Secondary | ICD-10-CM | POA: Insufficient documentation

## 2018-04-05 DIAGNOSIS — F17210 Nicotine dependence, cigarettes, uncomplicated: Secondary | ICD-10-CM | POA: Insufficient documentation

## 2018-04-05 DIAGNOSIS — M79605 Pain in left leg: Secondary | ICD-10-CM | POA: Insufficient documentation

## 2018-04-05 DIAGNOSIS — Z79899 Other long term (current) drug therapy: Secondary | ICD-10-CM | POA: Diagnosis not present

## 2018-04-05 DIAGNOSIS — M5441 Lumbago with sciatica, right side: Secondary | ICD-10-CM | POA: Insufficient documentation

## 2018-04-05 DIAGNOSIS — R569 Unspecified convulsions: Secondary | ICD-10-CM | POA: Diagnosis not present

## 2018-04-05 DIAGNOSIS — M545 Low back pain: Secondary | ICD-10-CM | POA: Diagnosis present

## 2018-04-05 DIAGNOSIS — I10 Essential (primary) hypertension: Secondary | ICD-10-CM | POA: Diagnosis not present

## 2018-04-05 DIAGNOSIS — I878 Other specified disorders of veins: Secondary | ICD-10-CM | POA: Diagnosis not present

## 2018-04-05 DIAGNOSIS — M25511 Pain in right shoulder: Secondary | ICD-10-CM | POA: Insufficient documentation

## 2018-04-05 DIAGNOSIS — M5442 Lumbago with sciatica, left side: Secondary | ICD-10-CM | POA: Diagnosis not present

## 2018-04-05 DIAGNOSIS — M25512 Pain in left shoulder: Secondary | ICD-10-CM | POA: Diagnosis not present

## 2018-04-05 DIAGNOSIS — Z794 Long term (current) use of insulin: Secondary | ICD-10-CM | POA: Diagnosis not present

## 2018-04-05 DIAGNOSIS — Z880 Allergy status to penicillin: Secondary | ICD-10-CM | POA: Insufficient documentation

## 2018-04-05 DIAGNOSIS — Z8781 Personal history of (healed) traumatic fracture: Secondary | ICD-10-CM | POA: Insufficient documentation

## 2018-04-05 DIAGNOSIS — R5381 Other malaise: Secondary | ICD-10-CM | POA: Insufficient documentation

## 2018-04-05 DIAGNOSIS — R5383 Other fatigue: Secondary | ICD-10-CM | POA: Insufficient documentation

## 2018-04-05 DIAGNOSIS — G8929 Other chronic pain: Secondary | ICD-10-CM

## 2018-04-05 MED ORDER — HYDROCODONE-ACETAMINOPHEN 10-325 MG PO TABS: 1.0000 | ORAL_TABLET | Freq: Four times a day (QID) | ORAL | 0 refills | Status: DC | PRN

## 2018-04-05 MED ORDER — LIDOCAINE 5 % EX OINT: 1.0000 "application " | TOPICAL_OINTMENT | CUTANEOUS | 0 refills | Status: DC | PRN

## 2018-04-05 MED ORDER — DULOXETINE HCL 30 MG PO CPEP: 60.0000 mg | ORAL_CAPSULE | Freq: Every day | ORAL | 0 refills | Status: DC

## 2018-04-05 NOTE — Progress Notes (Signed)
Nursing Pain Medication Assessment:  Safety precautions to be maintained throughout the outpatient stay will include: orient to surroundings, keep bed in low position, maintain call bell within reach at all times, provide assistance with transfer out of bed and ambulation.  Medication Inspection Compliance: Pill count conducted under aseptic conditions, in front of the patient. Neither the pills nor the bottle was removed from the patient's sight at any time. Once count was completed pills were immediately returned to the patient in their original bottle.  Medication: Hydrocodone/APAP Pill/Patch Count: 19 of 120 pills remain Pill/Patch Appearance: Markings consistent with prescribed medication Bottle Appearance: Standard pharmacy container. Clearly labeled. Filled Date: 09 / 28 / 2019 Last Medication intake:  Today

## 2018-04-05 NOTE — Progress Notes (Signed)
Patient's Name: [name]  MRN: [id number]  Referring Provider: Guadalupe Dawn, MD  DOB: 03/02/1966  PCP: Drew Dawn, MD  DOS: 04/05/2018  Note by: Drew Francois NP  Service setting: Ambulatory outpatient  Specialty: Interventional Pain Management  Location: [facility] (AMB) Pain Management Facility    Patient type: Established    Primary Reason(s) for Visit: Encounter for prescription drug management. (Level of risk: moderate)  CC: Back Pain (lumbar left is worse); Leg Pain (bilateral); and Foot Pain (bilateral left is worse )  HPI  [name] is a 52 y.o. year old, male patient, who comes today for a medication management evaluation. He has HYPERTRIGLYCERIDEMIA, SEVERE; OBESITY, NOS; HYPERTENSION, BENIGN SYSTEMIC; Recurrent boils; Pain in joint; Lumbosacral neuritis; DISTURBANCE OF SKIN SENSATION; LEG EDEMA, BILATERAL; TACHYCARDIA; TRANSAMINASES, SERUM, ELEVATED; Venous stasis; Heel callus; Erectile dysfunction; OSA (obstructive sleep apnea); Type 2 diabetes mellitus with neurologic complication ([place]); Skin tag; Seborrheic dermatitis of scalp; Chronic pain of both lower extremities (L>R); Bilateral shoulder pain; Tobacco dependence in remission; Other malaise and fatigue; Difficulty concentrating; Weight loss counseling, encounter for; Flank pain, acute; Pilonidal disease; Chest pain; Chronic bilateral low back pain with bilateral sciatica; Chronic pain syndrome; and Long term current use of opiate analgesic on their problem list. His primarily concern today is the Back Pain (lumbar left is worse); Leg Pain (bilateral); and Foot Pain (bilateral left is worse )  Pain Assessment: Location: Lower, Left, Right Back(legs and feet ) Radiating: back pain goes into legs, left is worse, bottom of legs are affected by neuropathy.  Onset: More than a month ago Duration: Chronic pain Quality: Discomfort, Aching, Dull, Numbness, Sharp, Tingling, Stabbing, Pins and needles, Pressure,  Constant Severity: 5 /10 (subjective, self-reported pain score)  Note: Reported level is compatible with observation.                          Effect on ADL: limiting. Timing: Constant Modifying factors: medications, heat, back brace that he wears sometimes.  BP: (!) 183/112  HR: 80  [name] was last scheduled for an appointment on 02/22/2018 for medication management. During today's appointment we reviewed [name] chronic pain status, as well as his outpatient medication regimen. He states that he does not have an concerns today related to his pain. He admits that he his BP is high for the second time he will follow up with his primary care provider.   The patient  reports that he does not use drugs. His body mass index is 41.05 kg/m.  Further details on both, my assessment(s), as well as the proposed treatment plan, please see below.  Controlled Substance Pharmacotherapy Assessment REMS (Risk Evaluation and Mitigation Strategy)  Analgesic:Hydrocodone 10 mg 4 times daily as needed, quantity 71-monthMME/day:462mday.  PaJanett BillowRN  04/05/2018  9:23 AM  Sign at close encounter Nursing Pain Medication Assessment:  Safety precautions to be maintained throughout the outpatient stay will include: orient to surroundings, keep bed in low position, maintain call bell within reach at all times, provide assistance with transfer out of bed and ambulation.  Medication Inspection Compliance: Pill count conducted under aseptic conditions, in front of the patient. Neither the pills nor the bottle was removed from the patient's sight at any time. Once count was completed pills were immediately returned to the patient in their original bottle.  Medication: Hydrocodone/APAP Pill/Patch Count: 19 of 120 pills remain Pill/Patch Appearance: Markings consistent with prescribed medication Bottle Appearance: Standard  pharmacy container. Clearly labeled. Filled Date: 09 / 28 / 2019 Last  Medication intake:  Today   Pharmacokinetics: Liberation and absorption (onset of action): WNL Distribution (time to peak effect): WNL Metabolism and excretion (duration of action): WNL         Pharmacodynamics: Desired effects: Analgesia: [name] reports >50% benefit. Functional ability: Patient reports that medication allows him to accomplish basic ADLs Clinically meaningful improvement in function (CMIF): Sustained CMIF goals met Perceived effectiveness: Described as relatively effective, allowing for increase in activities of daily living (ADL) Undesirable effects: Side-effects or Adverse reactions: None reported Monitoring:  PMP: Online review of the past 80-monthperiod conducted. Compliant with practice rules and regulations Last UDS on record: Summary  Date Value Ref Range Status  01/16/2018 FINAL  Final    Comment:    ==================================================================== TOXASSURE SELECT 13 (MW) ==================================================================== Test                             Result       Flag       Units Drug Present and Declared for Prescription Verification   Hydrocodone                    1013         EXPECTED   ng/mg creat   Norhydrocodone                 565          EXPECTED   ng/mg creat    Sources of hydrocodone include scheduled prescription    medications. Norhydrocodone is an expected metabolite of    hydrocodone. ==================================================================== Test                      Result    Flag   Units      Ref Range   Creatinine              23               mg/dL      >=20 ==================================================================== Declared Medications:  The flagging and interpretation on this report are based on the  following declared medications.  Unexpected results may arise from  inaccuracies in the declared medications.  **Note: The testing scope of this panel includes  these medications:  Hydrocodone (Norco)  **Note: The testing scope of this panel does not include following  reported medications:  Acetaminophen (Norco)  Aspirin (Aspirin 81)  Duloxetine (Cymbalta)  Insulin (Humalog)  Insulin (Lantus)  Multivitamin  Topical Lidocaine  Triamcinolone acetonide ==================================================================== For clinical consultation, please call ([phone] ====================================================================    UDS interpretation: Compliant          Medication Assessment Form: Reviewed. Patient indicates being compliant with therapy Treatment compliance: Compliant Risk Assessment Profile: Aberrant behavior: See prior evaluations. None observed or detected today Comorbid factors increasing risk of overdose: See prior notes. No additional risks detected today Opioid risk tool (ORT) (Total Score):   Personal History of Substance Abuse (SUD-Substance use disorder):  Alcohol:    Illegal Drugs:    Rx Drugs:    ORT Risk Level calculation:   Risk of substance use disorder (SUD): Low  ORT Scoring interpretation table:  Score <3 = Low Risk for SUD  Score between 4-7 = Moderate Risk for SUD  Score >8 = High Risk for Opioid Abuse   Risk Mitigation Strategies:  Patient Counseling:  Covered Patient-Prescriber Agreement (PPA): Present and active  Notification to other healthcare providers: Done  Pharmacologic Plan: No change in therapy, at this time.             Laboratory Chemistry  Inflammation Markers (CRP: Acute Phase) (ESR: Chronic Phase) Lab Results  Component Value Date   ESRSEDRATE 82 (H) 04/09/2008                         Rheumatology Markers Lab Results  Component Value Date   RF < 20 04/04/2008   LABURIC 5.7 04/04/2008                        Renal Function Markers Lab Results  Component Value Date   BUN 15 09/20/2016   CREATININE 0.92 09/20/2016   BCR 16 09/20/2016   GFRAA 113  09/20/2016   GFRNONAA 98 09/20/2016                             Hepatic Function Markers Lab Results  Component Value Date   AST 22 03/25/2015   ALT 25 03/25/2015   ALBUMIN 4.1 03/25/2015   ALKPHOS 65 03/25/2015   HCVAB NEG 04/02/2008   LIPASE 24 03/25/2015                        Electrolytes Lab Results  Component Value Date   NA 140 09/20/2016   K 4.7 09/20/2016   CL 99 09/20/2016   CALCIUM 9.9 09/20/2016   MG 2.0 09/20/2016                        Neuropathy Markers Lab Results  Component Value Date   VITAMINB12 1199 (H) 06/17/2009   FOLATE >20.0 ng/mL 06/17/2009   HGBA1C 10.1 09/22/2017                        CNS Tests No results found for: COLORCSF, APPEARCSF, RBCCOUNTCSF, WBCCSF, POLYSCSF, LYMPHSCSF, EOSCSF, PROTEINCSF, GLUCCSF, JCVIRUS, CSFOLI, IGGCSF                      Bone Pathology Markers Lab Results  Component Value Date   TESTOSTERONE 174 (L) 05/16/2017                         Coagulation Parameters Lab Results  Component Value Date   PLT 224 05/16/2017   DDIMER (H) 04/04/2008    0.54        AT THE INHOUSE ESTABLISHED CUTOFF VALUE OF 0.48 ug/mL FEU, THIS ASSAY HAS BEEN DOCUMENTED IN THE LITERATURE TO HAVE                        Cardiovascular Markers Lab Results  Component Value Date   CKTOTAL 106 04/04/2008   CKMB 4.1 (H) 04/04/2008   TROPONINI 0.01        NO INDICATION OF MYOCARDIAL INJURY. 04/04/2008   HGB 14.5 05/16/2017   HCT 42.0 05/16/2017                         CA Markers No results found for: CEA, CA125, LABCA2  Note: Lab results reviewed.  Recent Diagnostic Imaging Results  MR LUMBAR SPINE WO CONTRAST CLINICAL DATA:  Low back pain  EXAM: MRI LUMBAR SPINE WITHOUT CONTRAST  TECHNIQUE: Multiplanar, multisequence MR imaging of the lumbar spine was performed. No intravenous contrast was administered.  COMPARISON:  Lumbar MRI 02/11/2015  FINDINGS: Segmentation:  Normal  Alignment:   Normal  Vertebrae: Hemangioma L1 vertebral body. Negative for fracture or mass. Discogenic changes at L4-5  Conus medullaris: Extends to the L1 level and appears normal.  Paraspinal and other soft tissues: Negative  Disc levels:  T12-L1: Broad-based central disc protrusion unchanged. No significant stenosis  L1-2:  Negative  L2-3:  Negative  L3-4: Disc degeneration with disc bulging and mild endplate spurring. No significant stenosis.  L4-5: Postop laminectomy on the left. Disc degeneration and diffuse endplate spurring. Bilateral mild facet hypertrophy. Mild spinal stenosis, unchanged. Mild subarticular and foraminal stenosis due to spurring unchanged  L5-S1:  Negative  IMPRESSION: Disc bulging L3-4 unchanged  Postop laminectomy left L4-5. Disc degeneration and spurring. Mild spinal stenosis unchanged. Mild subarticular and foraminal stenosis unchanged  Overall, no change from the prior MRI of 02/11/2015  Electronically Signed   By: Franchot Gallo M.D.   On: 12/03/2016 09:16  Complexity Note: Imaging results reviewed. Results shared with Mr. Lina, using Layman's terms.                         Meds   Current Outpatient Medications:  .  aspirin 81 MG tablet, Take 81 mg by mouth daily., Disp: , Rfl:  .  DULoxetine (CYMBALTA) 30 MG capsule, Take 2 capsules (60 mg total) by mouth daily., Disp: 60 capsule, Rfl: 0 .  [START ON 04/09/2018] HYDROcodone-acetaminophen (NORCO) 10-325 MG tablet, Take 1 tablet by mouth every 6 (six) hours as needed for severe pain., Disp: 120 tablet, Rfl: 0 .  insulin glargine (LANTUS) 100 UNIT/ML injection, INJECT up to 50 U into the skin daily., Disp: 20 mL, Rfl: 5 .  insulin lispro (HUMALOG) 100 UNIT/ML injection, Inject 0.2 mLs (20 Units total) into the skin 3 (three) times daily before meals., Disp: 20 mL, Rfl: 1 .  lidocaine (XYLOCAINE) 5 % ointment, Apply 1 application topically as needed., Disp: 2500 g, Rfl: 0 .  Multiple Vitamin  (MULTIVITAMIN WITH MINERALS) TABS tablet, Take 1 tablet by mouth daily., Disp: , Rfl:  .  SYRINGE-NEEDLE, DISP, 3 ML (B-D SYRINGE/NEEDLE 3CC/25GX5/8) 25G X 5/8" 3 ML MISC, Use to administer insulin four times per day., Disp: 100 each, Rfl: 12 .  triamcinolone cream (KENALOG) 0.1 %, Apply 1 application 2 (two) times daily topically. As needed. Avoid excessive use on face, Disp: 30 g, Rfl: 0  ROS  Constitutional: Denies any fever or chills Gastrointestinal: No reported hemesis, hematochezia, vomiting, or acute GI distress Musculoskeletal: Denies any acute onset joint swelling, redness, loss of ROM, or weakness Neurological: No reported episodes of acute onset apraxia, aphasia, dysarthria, agnosia, amnesia, paralysis, loss of coordination, or loss of consciousness  Allergies  Mr. Standing is allergic to penicillins; metformin and related; and statins.  PFSH  Drug: Mr. Serpe  reports that he does not use drugs. Alcohol:  reports that he drinks alcohol. Tobacco:  reports that he quit smoking about 6 years ago. His smoking use included cigarettes. He has a 48.00 pack-year smoking history. He quit smokeless tobacco use about 6 years ago.  His smokeless tobacco use included snuff. Medical:  has a past medical history  of Arthritis, Bilateral edema of lower extremity, Bulge of cervical disc without myelopathy, Chronic low back pain, Disorder of subcutaneous tissue, Environmental allergies, History of cervical fracture, History of multiple concussions, History of pneumothorax, History of seizures (pt states takes gabapentin to control seizures and for neuropathy---  followed by PCP  dr  Lorenso Courier ([facility])  seizures are note mentioned as hx in her last note or previous notes), Hypertension, OSA on CPAP, Peripheral neuropathy, Recurrent boils, Type 2 diabetes mellitus ([place]), and Wears glasses. Surgical: Mr. Carne  has a past surgical history that includes I & D RIGHT INDEX FINGER PURULENT FLEXOR  TENOSYNOVITIS (08-23-2001); Lumbar disc surgery (02-08-2002); transthoracic echocardiogram (05-03-2007); REATTACHMENT LEFT INDEX FINGER (1999); Appendectomy (age 57); and Mass excision (N/A, 08/07/2015). Family: family history includes Asthma in his maternal aunt, maternal grandfather, maternal grandmother, maternal uncle, and mother; COPD in his mother; Diabetes in his maternal aunt, maternal grandfather, maternal grandmother, maternal uncle, and mother; Obesity in his maternal aunt, maternal grandfather, maternal grandmother, maternal uncle, and mother.  Constitutional Exam  General appearance: Well nourished, well developed, and well hydrated. In no apparent acute distress Vitals:   04/05/18 0913  BP: (!) 183/112  Pulse: 80  Resp: 16  Temp: 98.3 F (36.8 C)  TempSrc: Oral  SpO2: 98%  Weight: 270 lb (122.5 kg)  Height: 5' 8"  (1.727 m)  Psych/Mental status: Alert, oriented x 3 (person, place, & time)       Eyes: PERLA Respiratory: No evidence of acute respiratory distress  Lumbar Spine Area Exam  Skin & Axial Inspection: No masses, redness, or swelling Alignment: Symmetrical Functional ROM: Unrestricted ROM       Stability: No instability detected Muscle Tone/Strength: Functionally intact. No obvious neuro-muscular anomalies detected. Sensory (Neurological): Unimpaired Palpation: Tender         Gait & Posture Assessment  Ambulation: Patient ambulates using a cane Gait: Relatively normal for age and body habitus Posture: WNL   Lower Extremity Exam    Side: Right lower extremity  Side: Left lower extremity  Stability: No instability observed          Stability: No instability observed          Skin & Extremity Inspection: Positive color changes with abrasion and mild edema  Skin & Extremity Inspection: Positive color changes mild edema  Functional ROM: Unrestricted ROM                  Functional ROM: Unrestricted ROM                  Muscle Tone/Strength: Functionally intact.  No obvious neuro-muscular anomalies detected.  Muscle Tone/Strength: Functionally intact. No obvious neuro-muscular anomalies detected.  Sensory (Neurological): Unimpaired  Sensory (Neurological): Unimpaired  Palpation: No palpable anomalies  Palpation: No palpable anomalies   Assessment  Primary Diagnosis & Pertinent Problem List: Diagnoses of Chronic bilateral low back pain with bilateral sciatica, Chronic pain of both lower extremities (L>R), Bilateral shoulder pain, and Chronic pain syndrome were pertinent to this visit.  Status Diagnosis  Controlled Controlled Controlled 1. Chronic bilateral low back pain with bilateral sciatica   2. Chronic pain of both lower extremities (L>R)   3. Bilateral shoulder pain   4. Chronic pain syndrome     Problems updated and reviewed during this visit: No problems updated. Plan of Care  Pharmacotherapy (Medications Ordered): Meds ordered this encounter  Medications  . HYDROcodone-acetaminophen (NORCO) 10-325 MG tablet    Sig: Take 1 tablet by  mouth every 6 (six) hours as needed for severe pain.    Dispense:  120 tablet    Refill:  0    Do not add this medication to the electronic "Automatic Refill" notification system. Patient may have prescription filled one day early if pharmacy is closed on scheduled refill date.    Order Specific Question:   Supervising Provider    Answer:   Milinda Pointer [phone]  . lidocaine (XYLOCAINE) 5 % ointment    Sig: Apply 1 application topically as needed.    Dispense:  2500 g    Refill:  0    Order Specific Question:   Supervising Provider    Answer:   Milinda Pointer [phone]  . DULoxetine (CYMBALTA) 30 MG capsule    Sig: Take 2 capsules (60 mg total) by mouth daily.    Dispense:  60 capsule    Refill:  0    Order Specific Question:   Supervising Provider    AnswerMilinda Pointer [phone]   New Prescriptions   No medications on file   Medications administered today: Eulalio C. Wimmer had  no medications administered during this visit. Lab-work, procedure(s), and/or referral(s): No orders of the defined types were placed in this encounter.  Imaging and/or referral(s): None  Interventional therapies: Planned, scheduled, and/or pending:   Not at this time.   Provider-requested follow-up: Return in about 4 weeks (around 05/03/2018) for MedMgmt.  Future Appointments  Date Time Provider [place]  05/09/2018  8:00 AM Drew Francois, NP [facility] None   Primary Care Physician: Drew Dawn, MD Location: [facility] Outpatient Pain Management Facility Note by: Drew Francois NP Date: 04/05/2018; Time: 11:08 AM  Pain Score Disclaimer: We use the NRS-11 scale. This is a self-reported, subjective measurement of pain severity with only modest accuracy. It is used primarily to identify changes within a particular patient. It must be understood that outpatient pain scales are significantly less accurate that those used for research, where they can be applied under ideal controlled circumstances with minimal exposure to variables. In reality, the score is likely to be a combination of pain intensity and pain affect, where pain affect describes the degree of emotional arousal or changes in action readiness caused by the sensory experience of pain. Factors such as social and work situation, setting, emotional state, anxiety levels, expectation, and prior pain experience may influence pain perception and show large inter-individual differences that may also be affected by time variables.  Patient instructions provided during this appointment: Patient Instructions   ____________________________________________________________________________________________  Medication Rules  Applies to: All patients receiving prescriptions (written or electronic).  Pharmacy of record: Pharmacy where electronic prescriptions will be sent. If written prescriptions are taken to a different  pharmacy, please inform the nursing staff. The pharmacy listed in the electronic medical record should be the one where you would like electronic prescriptions to be sent.  Prescription refills: Only during scheduled appointments. Applies to both, written and electronic prescriptions.  NOTE: The following applies primarily to controlled substances (Opioid* Pain Medications).   Patient's responsibilities: 1. Pain Pills: Bring all pain pills to every appointment (except for procedure appointments). 2. Pill Bottles: Bring pills in original pharmacy bottle. Always bring newest bottle. Bring bottle, even if empty. 3. Medication refills: You are responsible for knowing and keeping track of what medications you need refilled. The day before your appointment, write a list of all prescriptions that need to be refilled. Bring that list to your appointment and  give it to the admitting nurse. Prescriptions will be written only during appointments. If you forget a medication, it will not be "Called in", "Faxed", or "electronically sent". You will need to get another appointment to get these prescribed. 4. Prescription Accuracy: You are responsible for carefully inspecting your prescriptions before leaving our office. Have the discharge nurse carefully go over each prescription with you, before taking them home. Make sure that your name is accurately spelled, that your address is correct. Check the name and dose of your medication to make sure it is accurate. Check the number of pills, and the written instructions to make sure they are clear and accurate. Make sure that you are given enough medication to last until your next medication refill appointment. 5. Taking Medication: Take medication as prescribed. Never take more pills than instructed. Never take medication more frequently than prescribed. Taking less pills or less frequently is permitted and encouraged, when it comes to controlled substances (written  prescriptions).  6. Inform other Doctors: Always inform, all of your healthcare providers, of all the medications you take. 7. Pain Medication from other Providers: You are not allowed to accept any additional pain medication from any other Doctor or Healthcare provider. There are two exceptions to this rule. (see below) In the event that you require additional pain medication, you are responsible for notifying us, as stated below. 8. Medication Agreement: You are responsible for carefully reading and following our Medication Agreement. This must be signed before receiving any prescriptions from our practice. Safely store a copy of your signed Agreement. Violations to the Agreement will result in no further prescriptions. (Additional copies of our Medication Agreement are available upon request.) 9. Laws, Rules, & Regulations: All patients are expected to follow all Federal and [employer], [employer], Rules, [employer]. Ignorance of the Laws does not constitute a valid excuse. The use of any illegal substances is prohibited. 10. Adopted CDC guidelines & recommendations: Target dosing levels will be at or below 60 MME/day. Use of benzodiazepines** is not recommended.  Exceptions: There are only two exceptions to the rule of not receiving pain medications from other Healthcare Providers. 1. Exception #1 (Emergencies): In the event of an emergency (i.e.: accident requiring emergency care), you are allowed to receive additional pain medication. However, you are responsible for: As soon as you are able, call our office [phone], at any time of the day or night, and leave a message stating your name, the date and nature of the emergency, and the name and dose of the medication prescribed. In the event that your call is answered by a member of our staff, make sure to document and save the date, time, and the name of the person that took your information.  2. Exception #2 (Planned Surgery): In the event that  you are scheduled by another doctor or dentist to have any type of surgery or procedure, you are allowed (for a period no longer than 30 days), to receive additional pain medication, for the acute post-op pain. However, in this case, you are responsible for picking up a copy of our "Post-op Pain Management for Surgeons" handout, and giving it to your surgeon or dentist. This document is available at our office, and does not require an appointment to obtain it. Simply go to our office during business hours (Monday-Thursday from 8:00 AM to 4:00 PM) (Friday 8:00 AM to 12:00 Noon) or if you have a scheduled appointment with [place], prior to your surgery, and ask for  it by name. In addition, you will need to provide [place] with your name, name of your surgeon, type of surgery, and date of procedure or surgery.  *Opioid medications include: morphine, codeine, oxycodone, oxymorphone, hydrocodone, hydromorphone, meperidine, tramadol, tapentadol, buprenorphine, fentanyl, methadone. **Benzodiazepine medications include: diazepam (Valium), alprazolam (Xanax), clonazepam (Klonopine), lorazepam (Ativan), clorazepate (Tranxene), chlordiazepoxide (Librium), estazolam (Prosom), oxazepam (Serax), temazepam (Restoril), triazolam (Halcion) (Last updated: 08/11/2017) ____________________________________________________________________________________________    BMI Assessment: Estimated body mass index is 41.05 kg/m as calculated from the following:   Height as of this encounter: 5' 8"  (1.727 m).   Weight as of this encounter: 270 lb (122.5 kg).  BMI interpretation table: BMI level Category Range association with higher incidence of chronic pain  <18 kg/m2 Underweight   18.5-24.9 kg/m2 Ideal body weight   25-29.9 kg/m2 Overweight Increased incidence by 20%  30-34.9 kg/m2 Obese (Class I) Increased incidence by 68%  35-39.9 kg/m2 Severe obesity (Class II) Increased incidence by 136%  >40 kg/m2 Extreme obesity (Class III)  Increased incidence by 254%   Patient's current BMI Ideal Body weight  Body mass index is 41.05 kg/m. Ideal body weight: 68.4 kg (150 lb 12.7 oz) Adjusted ideal body weight: 90 kg (198 lb 7.6 oz)   BMI Readings from Last 4 Encounters:  04/05/18 41.05 kg/m  02/22/18 41.05 kg/m  01/16/18 41.05 kg/m  12/21/17 41.05 kg/m   Wt Readings from Last 4 Encounters:  04/05/18 270 lb (122.5 kg)  02/22/18 270 lb (122.5 kg)  01/16/18 270 lb (122.5 kg)  12/21/17 270 lb (122.5 kg)   Cymbalta 30 mg 2 capsules by mouth daily to fill on 04/05/18  Hydrocodone - apap 10-325 x 1 month to fill on 04/09/18   Lidocaine ointment.   All of these medications were escribed to your pharmacy

## 2018-04-05 NOTE — Patient Instructions (Addendum)
____________________________________________________________________________________________  Medication Rules  Applies to: All patients receiving prescriptions (written or electronic).  Pharmacy of record: Pharmacy where electronic prescriptions will be sent. If written prescriptions are taken to a different pharmacy, please inform the nursing staff. The pharmacy listed in the electronic medical record should be the one where you would like electronic prescriptions to be sent.  Prescription refills: Only during scheduled appointments. Applies to both, written and electronic prescriptions.  NOTE: The following applies primarily to controlled substances (Opioid* Pain Medications).   Patient's responsibilities: 1. Pain Pills: Bring all pain pills to every appointment (except for procedure appointments). 2. Pill Bottles: Bring pills in original pharmacy bottle. Always bring newest bottle. Bring bottle, even if empty. 3. Medication refills: You are responsible for knowing and keeping track of what medications you need refilled. The day before your appointment, write a list of all prescriptions that need to be refilled. Bring that list to your appointment and give it to the admitting nurse. Prescriptions will be written only during appointments. If you forget a medication, it will not be "Called in", "Faxed", or "electronically sent". You will need to get another appointment to get these prescribed. 4. Prescription Accuracy: You are responsible for carefully inspecting your prescriptions before leaving our office. Have the discharge nurse carefully go over each prescription with you, before taking them home. Make sure that your name is accurately spelled, that your address is correct. Check the name and dose of your medication to make sure it is accurate. Check the number of pills, and the written instructions to make sure they are clear and accurate. Make sure that you are given enough medication to last  until your next medication refill appointment. 5. Taking Medication: Take medication as prescribed. Never take more pills than instructed. Never take medication more frequently than prescribed. Taking less pills or less frequently is permitted and encouraged, when it comes to controlled substances (written prescriptions).  6. Inform other Doctors: Always inform, all of your healthcare providers, of all the medications you take. 7. Pain Medication from other Providers: You are not allowed to accept any additional pain medication from any other Doctor or Healthcare provider. There are two exceptions to this rule. (see below) In the event that you require additional pain medication, you are responsible for notifying us, as stated below. 8. Medication Agreement: You are responsible for carefully reading and following our Medication Agreement. This must be signed before receiving any prescriptions from our practice. Safely store a copy of your signed Agreement. Violations to the Agreement will result in no further prescriptions. (Additional copies of our Medication Agreement are available upon request.) 9. Laws, Rules, & Regulations: All patients are expected to follow all Federal and State Laws, Statutes, Rules, & Regulations. Ignorance of the Laws does not constitute a valid excuse. The use of any illegal substances is prohibited. 10. Adopted CDC guidelines & recommendations: Target dosing levels will be at or below 60 MME/day. Use of benzodiazepines** is not recommended.  Exceptions: There are only two exceptions to the rule of not receiving pain medications from other Healthcare Providers. 1. Exception #1 (Emergencies): In the event of an emergency (i.e.: accident requiring emergency care), you are allowed to receive additional pain medication. However, you are responsible for: As soon as you are able, call our office [phone], at any time of the day or night, and leave a message stating your name, the  date and nature of the emergency, and the name and dose of the medication   prescribed. In the event that your call is answered by a member of our staff, make sure to document and save the date, time, and the name of the person that took your information.  2. Exception #2 (Planned Surgery): In the event that you are scheduled by another doctor or dentist to have any type of surgery or procedure, you are allowed (for a period no longer than 30 days), to receive additional pain medication, for the acute post-op pain. However, in this case, you are responsible for picking up a copy of our "Post-op Pain Management for Surgeons" handout, and giving it to your surgeon or dentist. This document is available at our office, and does not require an appointment to obtain it. Simply go to our office during business hours (Monday-Thursday from 8:00 AM to 4:00 PM) (Friday 8:00 AM to 12:00 Noon) or if you have a scheduled appointment with [place], prior to your surgery, and ask for it by name. In addition, you will need to provide [place] with your name, name of your surgeon, type of surgery, and date of procedure or surgery.  *Opioid medications include: morphine, codeine, oxycodone, oxymorphone, hydrocodone, hydromorphone, meperidine, tramadol, tapentadol, buprenorphine, fentanyl, methadone. **Benzodiazepine medications include: diazepam (Valium), alprazolam (Xanax), clonazepam (Klonopine), lorazepam (Ativan), clorazepate (Tranxene), chlordiazepoxide (Librium), estazolam (Prosom), oxazepam (Serax), temazepam (Restoril), triazolam (Halcion) (Last updated: 08/11/2017) ____________________________________________________________________________________________    BMI Assessment: Estimated body mass index is 41.05 kg/m as calculated from the following:   Height as of this encounter: 5\' 8"  (1.727 m).   Weight as of this encounter: 270 lb (122.5 kg).  BMI interpretation table: BMI level Category Range association with higher  incidence of chronic pain  <18 kg/m2 Underweight   18.5-24.9 kg/m2 Ideal body weight   25-29.9 kg/m2 Overweight Increased incidence by 20%  30-34.9 kg/m2 Obese (Class I) Increased incidence by 68%  35-39.9 kg/m2 Severe obesity (Class II) Increased incidence by 136%  >40 kg/m2 Extreme obesity (Class III) Increased incidence by 254%   Patient's current BMI Ideal Body weight  Body mass index is 41.05 kg/m. Ideal body weight: 68.4 kg (150 lb 12.7 oz) Adjusted ideal body weight: 90 kg (198 lb 7.6 oz)   BMI Readings from Last 4 Encounters:  04/05/18 41.05 kg/m  02/22/18 41.05 kg/m  01/16/18 41.05 kg/m  12/21/17 41.05 kg/m   Wt Readings from Last 4 Encounters:  04/05/18 270 lb (122.5 kg)  02/22/18 270 lb (122.5 kg)  01/16/18 270 lb (122.5 kg)  12/21/17 270 lb (122.5 kg)   Cymbalta 30 mg 2 capsules by mouth daily to fill on 04/05/18  Hydrocodone - apap 10-325 x 1 month to fill on 04/09/18   Lidocaine ointment.   All of these medications were escribed to your pharmacy

## 2018-04-06 ENCOUNTER — Other Ambulatory Visit: Payer: Self-pay | Admitting: Family Medicine

## 2018-05-09 ENCOUNTER — Encounter: Payer: Self-pay | Admitting: Nurse Practitioner

## 2018-05-09 ENCOUNTER — Other Ambulatory Visit: Payer: Self-pay

## 2018-05-09 ENCOUNTER — Ambulatory Visit: Payer: Medicare Other | Attending: Nurse Practitioner | Admitting: Nurse Practitioner

## 2018-05-09 VITALS — BP 142/105 | HR 72 | Temp 97.7°F | Ht 67.0 in | Wt 270.0 lb

## 2018-05-09 DIAGNOSIS — Z833 Family history of diabetes mellitus: Secondary | ICD-10-CM | POA: Diagnosis not present

## 2018-05-09 DIAGNOSIS — Z79891 Long term (current) use of opiate analgesic: Secondary | ICD-10-CM | POA: Insufficient documentation

## 2018-05-09 DIAGNOSIS — M25512 Pain in left shoulder: Secondary | ICD-10-CM | POA: Insufficient documentation

## 2018-05-09 DIAGNOSIS — M79604 Pain in right leg: Secondary | ICD-10-CM | POA: Diagnosis not present

## 2018-05-09 DIAGNOSIS — Z888 Allergy status to other drugs, medicaments and biological substances status: Secondary | ICD-10-CM | POA: Insufficient documentation

## 2018-05-09 DIAGNOSIS — Z79899 Other long term (current) drug therapy: Secondary | ICD-10-CM | POA: Insufficient documentation

## 2018-05-09 DIAGNOSIS — I878 Other specified disorders of veins: Secondary | ICD-10-CM | POA: Insufficient documentation

## 2018-05-09 DIAGNOSIS — E669 Obesity, unspecified: Secondary | ICD-10-CM | POA: Diagnosis not present

## 2018-05-09 DIAGNOSIS — Z6841 Body Mass Index (BMI) 40.0 and over, adult: Secondary | ICD-10-CM | POA: Insufficient documentation

## 2018-05-09 DIAGNOSIS — M549 Dorsalgia, unspecified: Secondary | ICD-10-CM | POA: Diagnosis present

## 2018-05-09 DIAGNOSIS — E1142 Type 2 diabetes mellitus with diabetic polyneuropathy: Secondary | ICD-10-CM | POA: Diagnosis not present

## 2018-05-09 DIAGNOSIS — G894 Chronic pain syndrome: Secondary | ICD-10-CM | POA: Insufficient documentation

## 2018-05-09 DIAGNOSIS — I10 Essential (primary) hypertension: Secondary | ICD-10-CM | POA: Diagnosis not present

## 2018-05-09 DIAGNOSIS — G4733 Obstructive sleep apnea (adult) (pediatric): Secondary | ICD-10-CM | POA: Insufficient documentation

## 2018-05-09 DIAGNOSIS — Z880 Allergy status to penicillin: Secondary | ICD-10-CM | POA: Diagnosis not present

## 2018-05-09 DIAGNOSIS — Z825 Family history of asthma and other chronic lower respiratory diseases: Secondary | ICD-10-CM | POA: Diagnosis not present

## 2018-05-09 DIAGNOSIS — Z87891 Personal history of nicotine dependence: Secondary | ICD-10-CM | POA: Diagnosis not present

## 2018-05-09 DIAGNOSIS — R569 Unspecified convulsions: Secondary | ICD-10-CM | POA: Insufficient documentation

## 2018-05-09 DIAGNOSIS — N529 Male erectile dysfunction, unspecified: Secondary | ICD-10-CM | POA: Insufficient documentation

## 2018-05-09 DIAGNOSIS — M5441 Lumbago with sciatica, right side: Secondary | ICD-10-CM | POA: Insufficient documentation

## 2018-05-09 DIAGNOSIS — G8929 Other chronic pain: Secondary | ICD-10-CM

## 2018-05-09 DIAGNOSIS — Z8781 Personal history of (healed) traumatic fracture: Secondary | ICD-10-CM | POA: Diagnosis not present

## 2018-05-09 DIAGNOSIS — Z794 Long term (current) use of insulin: Secondary | ICD-10-CM | POA: Insufficient documentation

## 2018-05-09 DIAGNOSIS — E781 Pure hyperglyceridemia: Secondary | ICD-10-CM | POA: Insufficient documentation

## 2018-05-09 DIAGNOSIS — M79605 Pain in left leg: Secondary | ICD-10-CM | POA: Insufficient documentation

## 2018-05-09 DIAGNOSIS — M792 Neuralgia and neuritis, unspecified: Secondary | ICD-10-CM | POA: Insufficient documentation

## 2018-05-09 DIAGNOSIS — M5442 Lumbago with sciatica, left side: Secondary | ICD-10-CM | POA: Diagnosis not present

## 2018-05-09 DIAGNOSIS — M25511 Pain in right shoulder: Secondary | ICD-10-CM | POA: Insufficient documentation

## 2018-05-09 DIAGNOSIS — Z7982 Long term (current) use of aspirin: Secondary | ICD-10-CM | POA: Diagnosis not present

## 2018-05-09 MED ORDER — DULOXETINE HCL 30 MG PO CPEP: 60.0000 mg | ORAL_CAPSULE | Freq: Every day | ORAL | 1 refills | Status: DC

## 2018-05-09 MED ORDER — LIDOCAINE 5 % EX OINT: 1.0000 "application " | TOPICAL_OINTMENT | Freq: Four times a day (QID) | CUTANEOUS | 1 refills | Status: DC | PRN

## 2018-05-09 MED ORDER — HYDROCODONE-ACETAMINOPHEN 10-325 MG PO TABS: 1.0000 | ORAL_TABLET | Freq: Four times a day (QID) | ORAL | 0 refills | Status: DC | PRN

## 2018-05-09 NOTE — Patient Instructions (Addendum)
____________________________________________________________________________________________  Medication Rules  Purpose: To inform patients, and their family members, of our rules and regulations.  Applies to: All patients receiving prescriptions (written or electronic).  Pharmacy of record: Pharmacy where electronic prescriptions will be sent. If written prescriptions are taken to a different pharmacy, please inform the nursing staff. The pharmacy listed in the electronic medical record should be the one where you would like electronic prescriptions to be sent.  Electronic prescriptions: In compliance with the [place] Strengthen Opioid Misuse Prevention (STOP) Act of 2017 (Session Law 2017-74/H243), effective June 14, 2018, all controlled substances must be electronically prescribed. Calling prescriptions to the pharmacy will cease to exist.  Prescription refills: Only during scheduled appointments. Applies to all prescriptions.  NOTE: The following applies primarily to controlled substances (Opioid* Pain Medications).   Patient's responsibilities: 1. Pain Pills: Bring all pain pills to every appointment (except for procedure appointments). 2. Pill Bottles: Bring pills in original pharmacy bottle. Always bring the newest bottle. Bring bottle, even if empty. 3. Medication refills: You are responsible for knowing and keeping track of what medications you take and those you need refilled. The day before your appointment: write a list of all prescriptions that need to be refilled. The day of the appointment: give the list to the admitting nurse. Prescriptions will be written only during appointments. If you forget a medication: it will not be "Called in", "Faxed", or "electronically sent". You will need to get another appointment to get these prescribed. No early refills. Do not call asking to have your prescription filled early. 4. Prescription Accuracy: You are responsible for  carefully inspecting your prescriptions before leaving our office. Have the discharge nurse carefully go over each prescription with you, before taking them home. Make sure that your name is accurately spelled, that your address is correct. Check the name and dose of your medication to make sure it is accurate. Check the number of pills, and the written instructions to make sure they are clear and accurate. Make sure that you are given enough medication to last until your next medication refill appointment. 5. Taking Medication: Take medication as prescribed. When it comes to controlled substances, taking less pills or less frequently than prescribed is permitted and encouraged. Never take more pills than instructed. Never take medication more frequently than prescribed.  6. Inform other Doctors: Always inform, all of your healthcare providers, of all the medications you take. 7. Pain Medication from other Providers: You are not allowed to accept any additional pain medication from any other Doctor or Healthcare provider. There are two exceptions to this rule. (see below) In the event that you require additional pain medication, you are responsible for notifying us, as stated below. 8. Medication Agreement: You are responsible for carefully reading and following our Medication Agreement. This must be signed before receiving any prescriptions from our practice. Safely store a copy of your signed Agreement. Violations to the Agreement will result in no further prescriptions. (Additional copies of our Medication Agreement are available upon request.) 9. Laws, Rules, & Regulations: All patients are expected to follow all Federal and State Laws, Statutes, Rules, & Regulations. Ignorance of the Laws does not constitute a valid excuse. The use of any illegal substances is prohibited. 10. Adopted CDC guidelines & recommendations: Target dosing levels will be at or below 60 MME/day. Use of benzodiazepines** is not  recommended.  Exceptions: There are only two exceptions to the rule of not receiving pain medications from other Healthcare Providers. 1.   Exception #1 (Emergencies): In the event of an emergency (i.e.: accident requiring emergency care), you are allowed to receive additional pain medication. However, you are responsible for: As soon as you are able, call our office [phone], at any time of the day or night, and leave a message stating your name, the date and nature of the emergency, and the name and dose of the medication prescribed. In the event that your call is answered by a member of our staff, make sure to document and save the date, time, and the name of the person that took your information.  2. Exception #2 (Planned Surgery): In the event that you are scheduled by another doctor or dentist to have any type of surgery or procedure, you are allowed (for a period no longer than 30 days), to receive additional pain medication, for the acute post-op pain. However, in this case, you are responsible for picking up a copy of our "Post-op Pain Management for Surgeons" handout, and giving it to your surgeon or dentist. This document is available at our office, and does not require an appointment to obtain it. Simply go to our office during business hours (Monday-Thursday from 8:00 AM to 4:00 PM) (Friday 8:00 AM to 12:00 Noon) or if you have a scheduled appointment with us, prior to your surgery, and ask for it by name. In addition, you will need to provide us with your name, name of your surgeon, type of surgery, and date of procedure or surgery.  *Opioid medications include: morphine, codeine, oxycodone, oxymorphone, hydrocodone, hydromorphone, meperidine, tramadol, tapentadol, buprenorphine, fentanyl, methadone. **Benzodiazepine medications include: diazepam (Valium), alprazolam (Xanax), clonazepam (Klonopine), lorazepam (Ativan), clorazepate (Tranxene), chlordiazepoxide (Librium), estazolam (Prosom),  oxazepam (Serax), temazepam (Restoril), triazolam (Halcion) (Last updated: 08/11/2017) ____________________________________________________________________________________________    BMI Assessment: Estimated body mass index is 42.29 kg/m as calculated from the following:   Height as of this encounter: 5\' 7"  (1.702 m).   Weight as of this encounter: 270 lb (122.5 kg).  BMI interpretation table: BMI level Category Range association with higher incidence of chronic pain  <18 kg/m2 Underweight   18.5-24.9 kg/m2 Ideal body weight   25-29.9 kg/m2 Overweight Increased incidence by 20%  30-34.9 kg/m2 Obese (Class I) Increased incidence by 68%  35-39.9 kg/m2 Severe obesity (Class II) Increased incidence by 136%  >40 kg/m2 Extreme obesity (Class III) Increased incidence by 254%   Patient's current BMI Ideal Body weight  Body mass index is 42.29 kg/m. Ideal body weight: 66.1 kg (145 lb 11.6 oz) Adjusted ideal body weight: 88.6 kg (195 lb 6.9 oz)   BMI Readings from Last 4 Encounters:  05/09/18 42.29 kg/m  04/05/18 41.05 kg/m  02/22/18 41.05 kg/m  01/16/18 41.05 kg/m   Wt Readings from Last 4 Encounters:  05/09/18 270 lb (122.5 kg)  04/05/18 270 lb (122.5 kg)  02/22/18 270 lb (122.5 kg)  01/16/18 270 lb (122.5 kg)

## 2018-05-09 NOTE — Progress Notes (Signed)
Patient's Name: [name]  MRN: [id number]  Referring Provider: Guadalupe Dawn, MD  DOB: 1965/10/16  PCP: Guadalupe Dawn, MD  DOS: 05/09/2018  Note by: Vevelyn Francois NP  Service setting: Ambulatory outpatient  Specialty: Interventional Pain Management  Location: [facility] (AMB) Pain Management Facility    Patient type: Established    Primary Reason(s) for Visit: Encounter for prescription drug management. (Level of risk: moderate)  CC: Back Pain  HPI  [name] is a 52 y.o. year old, male patient, who comes today for a medication management evaluation. He has HYPERTRIGLYCERIDEMIA, SEVERE; OBESITY, NOS; HYPERTENSION, BENIGN SYSTEMIC; Recurrent boils; Pain in joint; Lumbosacral neuritis; DISTURBANCE OF SKIN SENSATION; LEG EDEMA, BILATERAL; TACHYCARDIA; TRANSAMINASES, SERUM, ELEVATED; Venous stasis; Heel callus; Erectile dysfunction; OSA (obstructive sleep apnea); Type 2 diabetes mellitus with neurologic complication ([place]); Skin tag; Seborrheic dermatitis of scalp; Chronic pain of both lower extremities (L>R); Bilateral shoulder pain; Tobacco dependence in remission; Other malaise and fatigue; Difficulty concentrating; Weight loss counseling, encounter for; Flank pain, acute; Pilonidal disease; Chest pain; Chronic bilateral low back pain with bilateral sciatica; Chronic pain syndrome; Long term current use of opiate analgesic; and Neurogenic pain on their problem list. His primarily concern today is the Back Pain  Pain Assessment: Location: Left, Lower Back Radiating: back radiaties down both leg, worse on the left side Onset: More than a month ago Duration: Chronic pain Quality: Dull, Aching, Sharp, Numbness, Tingling, Pressure, Pins and needles Severity: 5 /10 (subjective, self-reported pain score)  Note: Reported level is compatible with observation.                          Effect on ADL: limits my daily activites Timing: Constant Modifying factors: medications, heat, lidocaine  cream BP: (!) 142/105  HR: 72  [name] was last scheduled for an appointment on 04/05/2018 for medication management. During today's appointment we reviewed Mr. Ottey chronic pain status, as well as his outpatient medication regimen.  He denies any new concerns today.  He denies any side effects of his current medication.  The patient  reports that he does not use drugs. His body mass index is 42.29 kg/m.  Further details on both, my assessment(s), as well as the proposed treatment plan, please see below.  Controlled Substance Pharmacotherapy Assessment REMS (Risk Evaluation and Mitigation Strategy)  Analgesic:Hydrocodone 10 mg 4 times daily as needed, quantity 81-monthMME/day:441mday.  BrChauncey FischerRN  05/09/2018  8:48 AM  Sign at close encounter Nursing Pain Medication Assessment:  Safety precautions to be maintained throughout the outpatient stay will include: orient to surroundings, keep bed in low position, maintain call bell within reach at all times, provide assistance with transfer out of bed and ambulation.  Medication Inspection Compliance: Pill count conducted under aseptic conditions, in front of the patient. Neither the pills nor the bottle was removed from the patient's sight at any time. Once count was completed pills were immediately returned to the patient in their original bottle.  Medication: Hydrocodone/APAP Pill/Patch Count: 7 of 120 pills remain Pill/Patch Appearance: Markings consistent with prescribed medication Bottle Appearance: Standard pharmacy container. Clearly labeled. Filled Date: 10 / 27 / 2019 Last Medication intake:  Today   Pharmacokinetics: Liberation and absorption (onset of action): WNL Distribution (time to peak effect): WNL Metabolism and excretion (duration of action): WNL         Pharmacodynamics: Desired effects: Analgesia: Mr. BeDreisbacheports >50% benefit. Functional ability: Patient reports that medication  allows him to  accomplish basic ADLs Clinically meaningful improvement in function (CMIF): Sustained CMIF goals met Perceived effectiveness: Described as relatively effective, allowing for increase in activities of daily living (ADL) Undesirable effects: Side-effects or Adverse reactions: None reported Monitoring: [place] PMP: Online review of the past 38-monthperiod conducted. Compliant with practice rules and regulations Last UDS on record: Summary  Date Value Ref Range Status  01/16/2018 FINAL  Final    Comment:    ==================================================================== TOXASSURE SELECT 13 (MW) ==================================================================== Test                             Result       Flag       Units Drug Present and Declared for Prescription Verification   Hydrocodone                    1013         EXPECTED   ng/mg creat   Norhydrocodone                 565          EXPECTED   ng/mg creat    Sources of hydrocodone include scheduled prescription    medications. Norhydrocodone is an expected metabolite of    hydrocodone. ==================================================================== Test                      Result    Flag   Units      Ref Range   Creatinine              23               mg/dL      >=20 ==================================================================== Declared Medications:  The flagging and interpretation on this report are based on the  following declared medications.  Unexpected results may arise from  inaccuracies in the declared medications.  **Note: The testing scope of this panel includes these medications:  Hydrocodone (Norco)  **Note: The testing scope of this panel does not include following  reported medications:  Acetaminophen (Norco)  Aspirin (Aspirin 81)  Duloxetine (Cymbalta)  Insulin (Humalog)  Insulin (Lantus)  Multivitamin  Topical Lidocaine  Triamcinolone  acetonide ==================================================================== For clinical consultation, please call ([phone] ====================================================================    UDS interpretation: Compliant          Medication Assessment Form: Reviewed. Patient indicates being compliant with therapy Treatment compliance: Compliant Risk Assessment Profile: Aberrant behavior: See prior evaluations. None observed or detected today Comorbid factors increasing risk of overdose: See prior notes. No additional risks detected today Opioid risk tool (ORT) (Total Score): 3 Personal History of Substance Abuse (SUD-Substance use disorder):  Alcohol: Negative  Illegal Drugs: Negative  Rx Drugs: Negative  ORT Risk Level calculation: Low Risk Risk of substance use disorder (SUD): Low Opioid Risk Tool - 05/09/18 0856      Family History of Substance Abuse   Alcohol  Negative    Illegal Drugs  Negative    Rx Drugs  Negative      Personal History of Substance Abuse   Alcohol  Negative    Illegal Drugs  Negative    Rx Drugs  Negative      Age   Age between 151-45years   No      History of Preadolescent Sexual Abuse   History of Preadolescent Sexual Abuse  Negative or Male  Psychological Disease   Psychological Disease  Positive    ADD  Positive    OCD  Positive    Bipolar  Negative    Schizophrenia  Negative    Depression  Positive      Total Score   Opioid Risk Tool Scoring  3    Opioid Risk Interpretation  Low Risk      ORT Scoring interpretation table:  Score <3 = Low Risk for SUD  Score between 4-7 = Moderate Risk for SUD  Score >8 = High Risk for Opioid Abuse   Risk Mitigation Strategies:  Patient Counseling: Covered Patient-Prescriber Agreement (PPA): Present and active  Notification to other healthcare providers: Done  Pharmacologic Plan: No change in therapy, at this time.             Laboratory Chemistry  Inflammation  Markers (CRP: Acute Phase) (ESR: Chronic Phase) Lab Results  Component Value Date   ESRSEDRATE 82 (H) 04/09/2008                         Rheumatology Markers Lab Results  Component Value Date   RF < 20 04/04/2008   LABURIC 5.7 04/04/2008                        Renal Function Markers Lab Results  Component Value Date   BUN 15 09/20/2016   CREATININE 0.92 09/20/2016   BCR 16 09/20/2016   GFRAA 113 09/20/2016   GFRNONAA 98 09/20/2016                             Hepatic Function Markers Lab Results  Component Value Date   AST 22 03/25/2015   ALT 25 03/25/2015   ALBUMIN 4.1 03/25/2015   ALKPHOS 65 03/25/2015   HCVAB NEG 04/02/2008   LIPASE 24 03/25/2015                        Electrolytes Lab Results  Component Value Date   NA 140 09/20/2016   K 4.7 09/20/2016   CL 99 09/20/2016   CALCIUM 9.9 09/20/2016   MG 2.0 09/20/2016                        Neuropathy Markers Lab Results  Component Value Date   VITAMINB12 1199 (H) 06/17/2009   FOLATE >20.0 ng/mL 06/17/2009   HGBA1C 10.1 09/22/2017                        CNS Tests No results found for: COLORCSF, APPEARCSF, RBCCOUNTCSF, WBCCSF, POLYSCSF, LYMPHSCSF, EOSCSF, PROTEINCSF, GLUCCSF, JCVIRUS, CSFOLI, IGGCSF                      Bone Pathology Markers Lab Results  Component Value Date   TESTOSTERONE 174 (L) 05/16/2017                         Coagulation Parameters Lab Results  Component Value Date   PLT 224 05/16/2017   DDIMER (H) 04/04/2008    0.54        AT THE INHOUSE ESTABLISHED CUTOFF VALUE OF 0.48 ug/mL FEU, THIS ASSAY HAS BEEN DOCUMENTED IN THE LITERATURE TO HAVE  Cardiovascular Markers Lab Results  Component Value Date   CKTOTAL 106 04/04/2008   CKMB 4.1 (H) 04/04/2008   TROPONINI 0.01        NO INDICATION OF MYOCARDIAL INJURY. 04/04/2008   HGB 14.5 05/16/2017   HCT 42.0 05/16/2017                         CA Markers No results found for: CEA, CA125, LABCA2                       Note: Lab results reviewed.  Recent Diagnostic Imaging Results  MR LUMBAR SPINE WO CONTRAST CLINICAL DATA:  Low back pain  EXAM: MRI LUMBAR SPINE WITHOUT CONTRAST  TECHNIQUE: Multiplanar, multisequence MR imaging of the lumbar spine was performed. No intravenous contrast was administered.  COMPARISON:  Lumbar MRI 02/11/2015  FINDINGS: Segmentation:  Normal  Alignment:  Normal  Vertebrae: Hemangioma L1 vertebral body. Negative for fracture or mass. Discogenic changes at L4-5  Conus medullaris: Extends to the L1 level and appears normal.  Paraspinal and other soft tissues: Negative  Disc levels:  T12-L1: Broad-based central disc protrusion unchanged. No significant stenosis  L1-2:  Negative  L2-3:  Negative  L3-4: Disc degeneration with disc bulging and mild endplate spurring. No significant stenosis.  L4-5: Postop laminectomy on the left. Disc degeneration and diffuse endplate spurring. Bilateral mild facet hypertrophy. Mild spinal stenosis, unchanged. Mild subarticular and foraminal stenosis due to spurring unchanged  L5-S1:  Negative  IMPRESSION: Disc bulging L3-4 unchanged  Postop laminectomy left L4-5. Disc degeneration and spurring. Mild spinal stenosis unchanged. Mild subarticular and foraminal stenosis unchanged  Overall, no change from the prior MRI of 02/11/2015  Electronically Signed   By: Franchot Gallo M.D.   On: 12/03/2016 09:16  Complexity Note: Imaging results reviewed. Results shared with Mr. Gunderman, using Layman's terms.                         Meds   Current Outpatient Medications:  .  aspirin 81 MG tablet, Take 81 mg by mouth daily., Disp: , Rfl:  .  HUMALOG 100 UNIT/ML injection, INJECT 0.2 MLS (20 UNITS TOTAL) INTO THE SKIN 3 (THREE) TIMES DAILY BEFORE MEALS., Disp: 20 mL, Rfl: 1 .  insulin glargine (LANTUS) 100 UNIT/ML injection, INJECT up to 50 U into the skin daily., Disp: 20 mL, Rfl: 5 .  lidocaine  (XYLOCAINE) 5 % ointment, Apply 1 application topically 4 (four) times daily as needed., Disp: 50 g, Rfl: 1 .  Multiple Vitamin (MULTIVITAMIN WITH MINERALS) TABS tablet, Take 1 tablet by mouth daily., Disp: , Rfl:  .  SYRINGE-NEEDLE, DISP, 3 ML (B-D SYRINGE/NEEDLE 3CC/25GX5/8) 25G X 5/8" 3 ML MISC, Use to administer insulin four times per day., Disp: 100 each, Rfl: 12 .  triamcinolone cream (KENALOG) 0.1 %, Apply 1 application 2 (two) times daily topically. As needed. Avoid excessive use on face, Disp: 30 g, Rfl: 0 .  DULoxetine (CYMBALTA) 30 MG capsule, Take 2 capsules (60 mg total) by mouth daily., Disp: 60 capsule, Rfl: 1 .  [START ON 05/10/2018] HYDROcodone-acetaminophen (NORCO) 10-325 MG tablet, Take 1 tablet by mouth every 6 (six) hours as needed for severe pain., Disp: 120 tablet, Rfl: 0 .  [START ON 06/09/2018] HYDROcodone-acetaminophen (NORCO) 10-325 MG tablet, Take 1 tablet by mouth every 6 (six) hours as needed for severe pain., Disp: 120 tablet, Rfl: 0  ROS  Constitutional: Denies any fever or chills Gastrointestinal: No reported hemesis, hematochezia, vomiting, or acute GI distress Musculoskeletal: Denies any acute onset joint swelling, redness, loss of ROM, or weakness Neurological: No reported episodes of acute onset apraxia, aphasia, dysarthria, agnosia, amnesia, paralysis, loss of coordination, or loss of consciousness  Allergies  Mr. Fortin is allergic to penicillins; metformin and related; and statins.  PFSH  Drug: Mr. Kepner  reports that he does not use drugs. Alcohol:  reports that he drinks alcohol. Tobacco:  reports that he quit smoking about 6 years ago. His smoking use included cigarettes. He has a 48.00 pack-year smoking history. He quit smokeless tobacco use about 6 years ago.  His smokeless tobacco use included snuff. Medical:  has a past medical history of Arthritis, Bilateral edema of lower extremity, Bulge of cervical disc without myelopathy, Chronic low back  pain, Disorder of subcutaneous tissue, Environmental allergies, History of cervical fracture, History of multiple concussions, History of pneumothorax, History of seizures (pt states takes gabapentin to control seizures and for neuropathy---  followed by PCP  dr  Lorenso Courier ([facility])  seizures are note mentioned as hx in her last note or previous notes), Hypertension, OSA on CPAP, Peripheral neuropathy, Recurrent boils, Type 2 diabetes mellitus ([place]), and Wears glasses. Surgical: Mr. Witty  has a past surgical history that includes I & D RIGHT INDEX FINGER PURULENT FLEXOR TENOSYNOVITIS (08-23-2001); Lumbar disc surgery (02-08-2002); transthoracic echocardiogram (05-03-2007); REATTACHMENT LEFT INDEX FINGER (1999); Appendectomy (age 26); and Mass excision (N/A, 08/07/2015). Family: family history includes Asthma in his maternal aunt, maternal grandfather, maternal grandmother, maternal uncle, and mother; COPD in his mother; Diabetes in his maternal aunt, maternal grandfather, maternal grandmother, maternal uncle, and mother; Obesity in his maternal aunt, maternal grandfather, maternal grandmother, maternal uncle, and mother.  Constitutional Exam  General appearance: Well nourished, well developed, and well hydrated. In no apparent acute distress Vitals:   05/09/18 0848  BP: (!) 142/105  Pulse: 72  Temp: 97.7 F (36.5 C)  SpO2: 100%  Weight: 270 lb (122.5 kg)  Height: 5' 7"  (1.702 m)  Psych/Mental status: Alert, oriented x 3 (person, place, & time)       Eyes: PERLA Respiratory: No evidence of acute respiratory distress  Lumbar Spine Area Exam  Skin & Axial Inspection: No masses, redness, or swelling Alignment: Symmetrical Functional ROM: Unrestricted ROM       Stability: No instability detected Muscle Tone/Strength: Functionally intact. No obvious neuro-muscular anomalies detected. Sensory (Neurological): Unimpaired Palpation: No palpable anomalies        Gait & Posture Assessment   Ambulation: Unassisted Gait: Relatively normal for age and body habitus Posture: WNL   Lower Extremity Exam    Side: Right lower extremity  Side: Left lower extremity  Stability: No instability observed          Stability: No instability observed          Skin & Extremity Inspection: Skin color, temperature, and hair growth are WNL. No peripheral edema or cyanosis. No masses, redness, swelling, asymmetry, or associated skin lesions. No contractures.  Skin & Extremity Inspection: Skin color, temperature, and hair growth are WNL. No peripheral edema or cyanosis. No masses, redness, swelling, asymmetry, or associated skin lesions. No contractures.  Functional ROM: Unrestricted ROM                  Functional ROM: Unrestricted ROM  Muscle Tone/Strength: Functionally intact. No obvious neuro-muscular anomalies detected.  Muscle Tone/Strength: Functionally intact. No obvious neuro-muscular anomalies detected.  Sensory (Neurological): Unimpaired        Sensory (Neurological): Unimpaired            Palpation: No palpable anomalies  Palpation: No palpable anomalies   Assessment  Primary Diagnosis & Pertinent Problem List: The primary encounter diagnosis was Chronic bilateral low back pain with bilateral sciatica. Diagnoses of Chronic pain of both lower extremities (L>R), Neurogenic pain, and Chronic pain syndrome were also pertinent to this visit.  Status Diagnosis  Controlled Controlled Controlled 1. Chronic bilateral low back pain with bilateral sciatica   2. Chronic pain of both lower extremities (L>R)   3. Neurogenic pain   4. Chronic pain syndrome     Problems updated and reviewed during this visit: Problem  Neurogenic Pain   Plan of Care  Pharmacotherapy (Medications Ordered): Meds ordered this encounter  Medications  . lidocaine (XYLOCAINE) 5 % ointment    Sig: Apply 1 application topically 4 (four) times daily as needed.    Dispense:  50 g    Refill:  1    Pt  requesting tubes    Order Specific Question:   Supervising Provider    Answer:   Milinda Pointer [phone]  . DULoxetine (CYMBALTA) 30 MG capsule    Sig: Take 2 capsules (60 mg total) by mouth daily.    Dispense:  60 capsule    Refill:  1    Order Specific Question:   Supervising Provider    Answer:   Milinda Pointer [phone]  . HYDROcodone-acetaminophen (NORCO) 10-325 MG tablet    Sig: Take 1 tablet by mouth every 6 (six) hours as needed for severe pain.    Dispense:  120 tablet    Refill:  0    Do not place this medication, or any other prescription from our practice, on "Automatic Refill". Patient may have prescription filled one day early if pharmacy is closed on scheduled refill date.    Order Specific Question:   Supervising Provider    Answer:   Milinda Pointer [phone]  . HYDROcodone-acetaminophen (NORCO) 10-325 MG tablet    Sig: Take 1 tablet by mouth every 6 (six) hours as needed for severe pain.    Dispense:  120 tablet    Refill:  0    Do not place this medication, or any other prescription from our practice, on "Automatic Refill". Patient may have prescription filled one day early if pharmacy is closed on scheduled refill date.    Order Specific Question:   Supervising Provider    Answer:   Milinda Pointer [phone]   New Prescriptions   HYDROCODONE-ACETAMINOPHEN (NORCO) 10-325 MG TABLET    Take 1 tablet by mouth every 6 (six) hours as needed for severe pain.   HYDROCODONE-ACETAMINOPHEN (NORCO) 10-325 MG TABLET    Take 1 tablet by mouth every 6 (six) hours as needed for severe pain.   Medications administered today: Ezell Poke. Legrand had no medications administered during this visit. Lab-work, procedure(s), and/or referral(s): No orders of the defined types were placed in this encounter.  Imaging and/or referral(s): None  Interventional therapies: Planned, scheduled, and/or pending:   Not at this time.    Provider-requested follow-up: Return in about 2  months (around 07/09/2018) for MedMgmt.  Future Appointments  Date Time Provider [place]  07/06/2018  8:30 AM Vevelyn Francois, NP [facility] None   Primary Care Physician: Guadalupe Dawn,  MD Location: [facility] Note by: Vevelyn Francois NP Date: 05/09/2018; Time: 11:42 AM  Pain Score Disclaimer: We use the NRS-11 scale. This is a self-reported, subjective measurement of pain severity with only modest accuracy. It is used primarily to identify changes within a particular patient. It must be understood that outpatient pain scales are significantly less accurate that those used for research, where they can be applied under ideal controlled circumstances with minimal exposure to variables. In reality, the score is likely to be a combination of pain intensity and pain affect, where pain affect describes the degree of emotional arousal or changes in action readiness caused by the sensory experience of pain. Factors such as social and work situation, setting, emotional state, anxiety levels, expectation, and prior pain experience may influence pain perception and show large inter-individual differences that may also be affected by time variables.  Patient instructions provided during this appointment: Patient Instructions   ____________________________________________________________________________________________  Medication Rules  Purpose: To inform patients, and their family members, of our rules and regulations.  Applies to: All patients receiving prescriptions (written or electronic).  Pharmacy of record: Pharmacy where electronic prescriptions will be sent. If written prescriptions are taken to a different pharmacy, please inform the nursing staff. The pharmacy listed in the electronic medical record should be the one where you would like electronic prescriptions to be sent.  Electronic prescriptions: In compliance with the [place] (STOP) Act of 2017 (Session Lanny Cramp [phone]), effective June 14, 2018, all controlled substances must be electronically prescribed. Calling prescriptions to the pharmacy will cease to exist.  Prescription refills: Only during scheduled appointments. Applies to all prescriptions.  NOTE: The following applies primarily to controlled substances (Opioid* Pain Medications).   Patient's responsibilities: 1. Pain Pills: Bring all pain pills to every appointment (except for procedure appointments). 2. Pill Bottles: Bring pills in original pharmacy bottle. Always bring the newest bottle. Bring bottle, even if empty. 3. Medication refills: You are responsible for knowing and keeping track of what medications you take and those you need refilled. The day before your appointment: write a list of all prescriptions that need to be refilled. The day of the appointment: give the list to the admitting nurse. Prescriptions will be written only during appointments. If you forget a medication: it will not be "Called in", "Faxed", or "electronically sent". You will need to get another appointment to get these prescribed. No early refills. Do not call asking to have your prescription filled early. 4. Prescription Accuracy: You are responsible for carefully inspecting your prescriptions before leaving our office. Have the discharge nurse carefully go over each prescription with you, before taking them home. Make sure that your name is accurately spelled, that your address is correct. Check the name and dose of your medication to make sure it is accurate. Check the number of pills, and the written instructions to make sure they are clear and accurate. Make sure that you are given enough medication to last until your next medication refill appointment. 5. Taking Medication: Take medication as prescribed. When it comes to controlled substances, taking less pills or less frequently than prescribed is  permitted and encouraged. Never take more pills than instructed. Never take medication more frequently than prescribed.  6. Inform other Doctors: Always inform, all of your healthcare providers, of all the medications you take. 7. Pain Medication from other Providers: You are not allowed to accept any additional pain medication from any other  Doctor or Healthcare provider. There are two exceptions to this rule. (see below) In the event that you require additional pain medication, you are responsible for notifying us, as stated below. 8. Medication Agreement: You are responsible for carefully reading and following our Medication Agreement. This must be signed before receiving any prescriptions from our practice. Safely store a copy of your signed Agreement. Violations to the Agreement will result in no further prescriptions. (Additional copies of our Medication Agreement are available upon request.) 9. Laws, Rules, & Regulations: All patients are expected to follow all Federal and [employer], Rules, [employer]. Ignorance of the Laws does not constitute a valid excuse. The use of any illegal substances is prohibited. 10. Adopted CDC guidelines & recommendations: Target dosing levels will be at or below 60 MME/day. Use of benzodiazepines** is not recommended.  Exceptions: There are only two exceptions to the rule of not receiving pain medications from other Healthcare Providers. 1. Exception #1 (Emergencies): In the event of an emergency (i.e.: accident requiring emergency care), you are allowed to receive additional pain medication. However, you are responsible for: As soon as you are able, call our office [phone], at any time of the day or night, and leave a message stating your name, the date and nature of the emergency, and the name and dose of the medication prescribed. In the event that your call is answered by a member of our staff, make sure to document and save the date, time, and  the name of the person that took your information.  2. Exception #2 (Planned Surgery): In the event that you are scheduled by another doctor or dentist to have any type of surgery or procedure, you are allowed (for a period no longer than 30 days), to receive additional pain medication, for the acute post-op pain. However, in this case, you are responsible for picking up a copy of our "Post-op Pain Management for Surgeons" handout, and giving it to your surgeon or dentist. This document is available at our office, and does not require an appointment to obtain it. Simply go to our office during business hours (Monday-Thursday from 8:00 AM to 4:00 PM) (Friday 8:00 AM to 12:00 Noon) or if you have a scheduled appointment with [place], prior to your surgery, and ask for it by name. In addition, you will need to provide [place] with your name, name of your surgeon, type of surgery, and date of procedure or surgery.  *Opioid medications include: morphine, codeine, oxycodone, oxymorphone, hydrocodone, hydromorphone, meperidine, tramadol, tapentadol, buprenorphine, fentanyl, methadone. **Benzodiazepine medications include: diazepam (Valium), alprazolam (Xanax), clonazepam (Klonopine), lorazepam (Ativan), clorazepate (Tranxene), chlordiazepoxide (Librium), estazolam (Prosom), oxazepam (Serax), temazepam (Restoril), triazolam (Halcion) (Last updated: 08/11/2017) ____________________________________________________________________________________________    BMI Assessment: Estimated body mass index is 42.29 kg/m as calculated from the following:   Height as of this encounter: 5' 7"  (1.702 m).   Weight as of this encounter: 270 lb (122.5 kg).  BMI interpretation table: BMI level Category Range association with higher incidence of chronic pain  <18 kg/m2 Underweight   18.5-24.9 kg/m2 Ideal body weight   25-29.9 kg/m2 Overweight Increased incidence by 20%  30-34.9 kg/m2 Obese (Class I) Increased incidence by 68%   35-39.9 kg/m2 Severe obesity (Class II) Increased incidence by 136%  >40 kg/m2 Extreme obesity (Class III) Increased incidence by 254%   Patient's current BMI Ideal Body weight  Body mass index is 42.29 kg/m. Ideal body weight: 66.1 kg (145 lb 11.6 oz) Adjusted ideal body weight:  88.6 kg (195 lb 6.9 oz)   BMI Readings from Last 4 Encounters:  05/09/18 42.29 kg/m  04/05/18 41.05 kg/m  02/22/18 41.05 kg/m  01/16/18 41.05 kg/m   Wt Readings from Last 4 Encounters:  05/09/18 270 lb (122.5 kg)  04/05/18 270 lb (122.5 kg)  02/22/18 270 lb (122.5 kg)  01/16/18 270 lb (122.5 kg)

## 2018-05-09 NOTE — Progress Notes (Signed)
Nursing Pain Medication Assessment:  Safety precautions to be maintained throughout the outpatient stay will include: orient to surroundings, keep bed in low position, maintain call bell within reach at all times, provide assistance with transfer out of bed and ambulation.  Medication Inspection Compliance: Pill count conducted under aseptic conditions, in front of the patient. Neither the pills nor the bottle was removed from the patient's sight at any time. Once count was completed pills were immediately returned to the patient in their original bottle.  Medication: Hydrocodone/APAP Pill/Patch Count: 7 of 120 pills remain Pill/Patch Appearance: Markings consistent with prescribed medication Bottle Appearance: Standard pharmacy container. Clearly labeled. Filled Date: 10 / 27 / 2019 Last Medication intake:  Today

## 2018-05-24 ENCOUNTER — Other Ambulatory Visit: Payer: Self-pay | Admitting: Family Medicine

## 2018-06-27 ENCOUNTER — Ambulatory Visit: Payer: Medicare Other | Admitting: Family Medicine

## 2018-07-06 ENCOUNTER — Ambulatory Visit: Payer: Medicare Other | Attending: Nurse Practitioner | Admitting: Nurse Practitioner

## 2018-07-06 ENCOUNTER — Encounter: Payer: Self-pay | Admitting: Nurse Practitioner

## 2018-07-06 ENCOUNTER — Other Ambulatory Visit: Payer: Self-pay

## 2018-07-06 VITALS — BP 145/97 | HR 79 | Temp 97.5°F | Ht 67.0 in | Wt 268.0 lb

## 2018-07-06 DIAGNOSIS — M792 Neuralgia and neuritis, unspecified: Secondary | ICD-10-CM | POA: Insufficient documentation

## 2018-07-06 DIAGNOSIS — G894 Chronic pain syndrome: Secondary | ICD-10-CM | POA: Insufficient documentation

## 2018-07-06 DIAGNOSIS — M899 Disorder of bone, unspecified: Secondary | ICD-10-CM | POA: Insufficient documentation

## 2018-07-06 DIAGNOSIS — Z79891 Long term (current) use of opiate analgesic: Secondary | ICD-10-CM

## 2018-07-06 DIAGNOSIS — M5442 Lumbago with sciatica, left side: Secondary | ICD-10-CM | POA: Insufficient documentation

## 2018-07-06 DIAGNOSIS — M79604 Pain in right leg: Secondary | ICD-10-CM | POA: Diagnosis not present

## 2018-07-06 DIAGNOSIS — G8929 Other chronic pain: Secondary | ICD-10-CM | POA: Diagnosis not present

## 2018-07-06 DIAGNOSIS — Z789 Other specified health status: Secondary | ICD-10-CM | POA: Diagnosis not present

## 2018-07-06 DIAGNOSIS — Z79899 Other long term (current) drug therapy: Secondary | ICD-10-CM | POA: Diagnosis not present

## 2018-07-06 DIAGNOSIS — M79605 Pain in left leg: Secondary | ICD-10-CM | POA: Insufficient documentation

## 2018-07-06 DIAGNOSIS — M5441 Lumbago with sciatica, right side: Secondary | ICD-10-CM | POA: Diagnosis not present

## 2018-07-06 MED ORDER — HYDROCODONE-ACETAMINOPHEN 10-325 MG PO TABS: 1.0000 | ORAL_TABLET | Freq: Four times a day (QID) | ORAL | 0 refills | Status: DC | PRN

## 2018-07-06 MED ORDER — LIDOCAINE 5 % EX OINT: 1.0000 "application " | TOPICAL_OINTMENT | Freq: Four times a day (QID) | CUTANEOUS | 1 refills | Status: DC | PRN

## 2018-07-06 MED ORDER — DULOXETINE HCL 30 MG PO CPEP: 60.0000 mg | ORAL_CAPSULE | Freq: Every day | ORAL | 1 refills | Status: DC

## 2018-07-06 NOTE — Progress Notes (Signed)
Nursing Pain Medication Assessment:  Safety precautions to be maintained throughout the outpatient stay will include: orient to surroundings, keep bed in low position, maintain call bell within reach at all times, provide assistance with transfer out of bed and ambulation.  Medication Inspection Compliance: Pill count conducted under aseptic conditions, in front of the patient. Neither the pills nor the bottle was removed from the patient's sight at any time. Once count was completed pills were immediately returned to the patient in their original bottle.  Medication: Hydrocodone/APAP Pill/Patch Count: 22 of 120 pills remain Pill/Patch Appearance: Markings consistent with prescribed medication Bottle Appearance: Standard pharmacy container. Clearly labeled. Filled Date: 35 / 27 / 2019 Last Medication intake:  Today

## 2018-07-06 NOTE — Progress Notes (Signed)
Patient's Name: [name]  MRN: [id number]  Referring Provider: Guadalupe Dawn, MD  DOB: 1965/07/14  PCP: Guadalupe Dawn, MD  DOS: 07/06/2018  Note by: Vevelyn Francois NP  Service setting: Ambulatory outpatient  Specialty: Interventional Pain Management  Location: [facility] (AMB) Pain Management Facility    Patient type: Established    Primary Reason(s) for Visit: Encounter for prescription drug management. (Level of risk: moderate)  CC: Back Pain  HPI  [name] is a 53 y.o. year old, male patient, who comes today for a medication management evaluation. He has HYPERTRIGLYCERIDEMIA, SEVERE; OBESITY, NOS; HYPERTENSION, BENIGN SYSTEMIC; Recurrent boils; Pain in joint; Lumbosacral neuritis; DISTURBANCE OF SKIN SENSATION; LEG EDEMA, BILATERAL; TACHYCARDIA; TRANSAMINASES, SERUM, ELEVATED; Venous stasis; Heel callus; Erectile dysfunction; OSA (obstructive sleep apnea); Type 2 diabetes mellitus with neurologic complication ([place]); Skin tag; Seborrheic dermatitis of scalp; Chronic pain of both lower extremities (L>R); Bilateral shoulder pain; Tobacco dependence in remission; Other malaise and fatigue; Difficulty concentrating; Weight loss counseling, encounter for; Flank pain, acute; Pilonidal disease; Chest pain; Chronic bilateral low back pain with bilateral sciatica; Chronic pain syndrome; Long term current use of opiate analgesic; Neurogenic pain; Pharmacologic therapy; Disorder of skeletal system; and Problems influencing health status on their problem list. His primarily concern today is the Back Pain  Pain Assessment: Location: Left, Lower Back Radiating: pain radiaties down both leg, worse on the left Onset: More than a month ago Duration: Chronic pain Quality: Dull, Aching, Sharp, Tingling, Constant Severity: 5 /10 (subjective, self-reported pain score)  Note: Reported level is compatible with observation.                          Effect on ADL: limits my daily activities Timing:  Constant Modifying factors: medications, heat, lidocaine cream BP: (!) 145/97  HR: 79  [name] was last scheduled for an appointment on 05/09/2018 for medication management. During today's appointment we reviewed [name] chronic pain status, as well as his outpatient medication regimen. He denies any changes in his current pain condition. He denies any side effects of his medication.   The patient  reports no history of drug use. His body mass index is 41.97 kg/m.  Further details on both, my assessment(s), as well as the proposed treatment plan, please see below.  Controlled Substance Pharmacotherapy Assessment REMS (Risk Evaluation and Mitigation Strategy)  Analgesic:Hydrocodone 10 mg 4 times daily as needed, quantity 51-monthMME/day:443mday.  Drew FischerRN  07/06/2018  8:20 AM  Sign when Signing Visit Nursing Pain Medication Assessment:  Safety precautions to be maintained throughout the outpatient stay will include: orient to surroundings, keep bed in low position, maintain call bell within reach at all times, provide assistance with transfer out of bed and ambulation.  Medication Inspection Compliance: Pill count conducted under aseptic conditions, in front of the patient. Neither the pills nor the bottle was removed from the patient's sight at any time. Once count was completed pills were immediately returned to the patient in their original bottle.  Medication: Hydrocodone/APAP Pill/Patch Count: 22 of 120 pills remain Pill/Patch Appearance: Markings consistent with prescribed medication Bottle Appearance: Standard pharmacy container. Clearly labeled. Filled Date: 1215 27 / 2019 Last Medication intake:  Today   Pharmacokinetics: Liberation and absorption (onset of action): WNL Distribution (time to peak effect): WNL Metabolism and excretion (duration of action): WNL         Pharmacodynamics: Desired effects: Analgesia: [name] >50%  benefit.  Functional ability: Patient reports that medication allows him to accomplish basic ADLs Clinically meaningful improvement in function (CMIF): Sustained CMIF goals met Perceived effectiveness: Described as relatively effective, allowing for increase in activities of daily living (ADL) Undesirable effects: Side-effects or Adverse reactions: None reported Monitoring: [place] PMP: Online review of the past 51-monthperiod conducted. Compliant with practice rules and regulations Last UDS on record: Summary  Date Value Ref Range Status  01/16/2018 FINAL  Final    Comment:    ==================================================================== TOXASSURE SELECT 13 (MW) ==================================================================== Test                             Result       Flag       Units Drug Present and Declared for Prescription Verification   Hydrocodone                    1013         EXPECTED   ng/mg creat   Norhydrocodone                 565          EXPECTED   ng/mg creat    Sources of hydrocodone include scheduled prescription    medications. Norhydrocodone is an expected metabolite of    hydrocodone. ==================================================================== Test                      Result    Flag   Units      Ref Range   Creatinine              23               mg/dL      >=20 ==================================================================== Declared Medications:  The flagging and interpretation on this report are based on the  following declared medications.  Unexpected results may arise from  inaccuracies in the declared medications.  **Note: The testing scope of this panel includes these medications:  Hydrocodone (Norco)  **Note: The testing scope of this panel does not include following  reported medications:  Acetaminophen (Norco)  Aspirin (Aspirin 81)  Duloxetine (Cymbalta)  Insulin (Humalog)  Insulin (Lantus)  Multivitamin  Topical  Lidocaine  Triamcinolone acetonide ==================================================================== For clinical consultation, please call ([phone] ====================================================================    UDS interpretation: Compliant          Medication Assessment Form: Reviewed. Patient indicates being compliant with therapy Treatment compliance: Compliant Risk Assessment Profile: Aberrant behavior: See prior evaluations. None observed or detected today Comorbid factors increasing risk of overdose: See prior notes. No additional risks detected today Opioid risk tool (ORT) (Total Score): 0 Personal History of Substance Abuse (SUD-Substance use disorder):  Alcohol: Negative  Illegal Drugs: Negative  Rx Drugs: Negative  ORT Risk Level calculation: Low Risk Risk of substance use disorder (SUD): Low Opioid Risk Tool - 07/06/18 0827      Family History of Substance Abuse   Alcohol  Negative    Illegal Drugs  Negative    Rx Drugs  Negative      Personal History of Substance Abuse   Alcohol  Negative    Illegal Drugs  Negative    Rx Drugs  Negative      Age   Age between 155-45years   No      History of Preadolescent Sexual Abuse   History of Preadolescent Sexual Abuse  Negative or Male  Psychological Disease   Psychological Disease  Negative    ADD  Negative    OCD  Negative    Bipolar  Negative    Schizophrenia  Negative    Depression  Negative      Total Score   Opioid Risk Tool Scoring  0    Opioid Risk Interpretation  Low Risk      ORT Scoring interpretation table:  Score <3 = Low Risk for SUD  Score between 4-7 = Moderate Risk for SUD  Score >8 = High Risk for Opioid Abuse   Risk Mitigation Strategies:  Patient Counseling: Covered Patient-Prescriber Agreement (PPA): Present and active  Notification to other healthcare providers: Done  Pharmacologic Plan: No change in therapy, at this time.             Laboratory Chemistry   Inflammation Markers (CRP: Acute Phase) (ESR: Chronic Phase) Lab Results  Component Value Date   ESRSEDRATE 82 (H) 04/09/2008                         Rheumatology Markers Lab Results  Component Value Date   RF < 20 04/04/2008   LABURIC 5.7 04/04/2008                        Renal Function Markers Lab Results  Component Value Date   BUN 15 09/20/2016   CREATININE 0.92 09/20/2016   BCR 16 09/20/2016   GFRAA 113 09/20/2016   GFRNONAA 98 09/20/2016                             Hepatic Function Markers Lab Results  Component Value Date   AST 22 03/25/2015   ALT 25 03/25/2015   ALBUMIN 4.1 03/25/2015   ALKPHOS 65 03/25/2015   HCVAB NEG 04/02/2008   LIPASE 24 03/25/2015                        Electrolytes Lab Results  Component Value Date   NA 140 09/20/2016   K 4.7 09/20/2016   CL 99 09/20/2016   CALCIUM 9.9 09/20/2016   MG 2.0 09/20/2016                        Neuropathy Markers Lab Results  Component Value Date   VITAMINB12 1199 (H) 06/17/2009   FOLATE >20.0 ng/mL 06/17/2009   HGBA1C 10.1 09/22/2017                        CNS Tests No results found for: COLORCSF, APPEARCSF, RBCCOUNTCSF, WBCCSF, POLYSCSF, LYMPHSCSF, EOSCSF, PROTEINCSF, GLUCCSF, JCVIRUS, CSFOLI, IGGCSF                      Bone Pathology Markers Lab Results  Component Value Date   TESTOSTERONE 174 (L) 05/16/2017                         Coagulation Parameters Lab Results  Component Value Date   PLT 224 05/16/2017   DDIMER (H) 04/04/2008    0.54        AT THE INHOUSE ESTABLISHED CUTOFF VALUE OF 0.48 ug/mL FEU, THIS ASSAY HAS BEEN DOCUMENTED IN THE LITERATURE TO HAVE  Cardiovascular Markers Lab Results  Component Value Date   CKTOTAL 106 04/04/2008   CKMB 4.1 (H) 04/04/2008   TROPONINI 0.01        NO INDICATION OF MYOCARDIAL INJURY. 04/04/2008   HGB 14.5 05/16/2017   HCT 42.0 05/16/2017                         CA Markers No results found for: CEA,  CA125, LABCA2                      Note: Lab results reviewed.  Recent Diagnostic Imaging Results  MR LUMBAR SPINE WO CONTRAST CLINICAL DATA:  Low back pain  EXAM: MRI LUMBAR SPINE WITHOUT CONTRAST  TECHNIQUE: Multiplanar, multisequence MR imaging of the lumbar spine was performed. No intravenous contrast was administered.  COMPARISON:  Lumbar MRI 02/11/2015  FINDINGS: Segmentation:  Normal  Alignment:  Normal  Vertebrae: Hemangioma L1 vertebral body. Negative for fracture or mass. Discogenic changes at L4-5  Conus medullaris: Extends to the L1 level and appears normal.  Paraspinal and other soft tissues: Negative  Disc levels:  T12-L1: Broad-based central disc protrusion unchanged. No significant stenosis  L1-2:  Negative  L2-3:  Negative  L3-4: Disc degeneration with disc bulging and mild endplate spurring. No significant stenosis.  L4-5: Postop laminectomy on the left. Disc degeneration and diffuse endplate spurring. Bilateral mild facet hypertrophy. Mild spinal stenosis, unchanged. Mild subarticular and foraminal stenosis due to spurring unchanged  L5-S1:  Negative  IMPRESSION: Disc bulging L3-4 unchanged  Postop laminectomy left L4-5. Disc degeneration and spurring. Mild spinal stenosis unchanged. Mild subarticular and foraminal stenosis unchanged  Overall, no change from the prior MRI of 02/11/2015  Electronically Signed   By: Franchot Gallo M.D.   On: 12/03/2016 09:16  Complexity Note: Imaging results reviewed. Results shared with Mr. Xiang, using Layman's terms.                         Meds   Current Outpatient Medications:  .  aspirin 81 MG tablet, Take 81 mg by mouth daily., Disp: , Rfl:  .  DULoxetine (CYMBALTA) 30 MG capsule, Take 2 capsules (60 mg total) by mouth daily., Disp: 60 capsule, Rfl: 1 .  HUMALOG 100 UNIT/ML injection, INJECT20 UNITS TOTAL INTO THE SKIN 3 (THREE) TIMES DAILY BEFORE MEALS., Disp: 20 mL, Rfl: 1 .  [START ON  07/09/2018] HYDROcodone-acetaminophen (NORCO) 10-325 MG tablet, Take 1 tablet by mouth every 6 (six) hours as needed for up to 30 days for severe pain., Disp: 120 tablet, Rfl: 0 .  insulin glargine (LANTUS) 100 UNIT/ML injection, INJECT up to 50 U into the skin daily., Disp: 20 mL, Rfl: 5 .  lidocaine (XYLOCAINE) 5 % ointment, Apply 1 application topically 4 (four) times daily as needed., Disp: 50 g, Rfl: 1 .  Multiple Vitamin (MULTIVITAMIN WITH MINERALS) TABS tablet, Take 1 tablet by mouth daily., Disp: , Rfl:  .  SYRINGE-NEEDLE, DISP, 3 ML (B-D SYRINGE/NEEDLE 3CC/25GX5/8) 25G X 5/8" 3 ML MISC, Use to administer insulin four times per day., Disp: 100 each, Rfl: 12 .  triamcinolone cream (KENALOG) 0.1 %, Apply 1 application 2 (two) times daily topically. As needed. Avoid excessive use on face, Disp: 30 g, Rfl: 0 .  [START ON 08/08/2018] HYDROcodone-acetaminophen (NORCO) 10-325 MG tablet, Take 1 tablet by mouth every 6 (six) hours as needed for up to 30 days for severe pain.,  Disp: 120 tablet, Rfl: 0  ROS  Constitutional: Denies any fever or chills Gastrointestinal: No reported hemesis, hematochezia, vomiting, or acute GI distress Musculoskeletal: Denies any acute onset joint swelling, redness, loss of ROM, or weakness Neurological: No reported episodes of acute onset apraxia, aphasia, dysarthria, agnosia, amnesia, paralysis, loss of coordination, or loss of consciousness  Allergies  Mr. Nutting is allergic to penicillins; metformin and related; and statins.  PFSH  Drug: Mr. Canela  reports no history of drug use. Alcohol:  reports current alcohol use. Tobacco:  reports that he quit smoking about 6 years ago. His smoking use included cigarettes. He has a 48.00 pack-year smoking history. He quit smokeless tobacco use about 6 years ago.  His smokeless tobacco use included snuff. Medical:  has a past medical history of Arthritis, Bilateral edema of lower extremity, Bulge of cervical disc without  myelopathy, Chronic low back pain, Disorder of subcutaneous tissue, Environmental allergies, History of cervical fracture, History of multiple concussions, History of pneumothorax, History of seizures (pt states takes gabapentin to control seizures and for neuropathy---  followed by PCP  dr  Lorenso Courier ([facility])  seizures are note mentioned as hx in her last note or previous notes), Hypertension, OSA on CPAP, Peripheral neuropathy, Recurrent boils, Type 2 diabetes mellitus ([place]), and Wears glasses. Surgical: Mr. Stith  has a past surgical history that includes I & D RIGHT INDEX FINGER PURULENT FLEXOR TENOSYNOVITIS (08-23-2001); Lumbar disc surgery (02-08-2002); transthoracic echocardiogram (05-03-2007); REATTACHMENT LEFT INDEX FINGER (1999); Appendectomy (age 69); and Mass excision (N/A, 08/07/2015). Family: family history includes Asthma in his maternal aunt, maternal grandfather, maternal grandmother, maternal uncle, and mother; COPD in his mother; Diabetes in his maternal aunt, maternal grandfather, maternal grandmother, maternal uncle, and mother; Obesity in his maternal aunt, maternal grandfather, maternal grandmother, maternal uncle, and mother.  Constitutional Exam  General appearance: Well nourished, well developed, and well hydrated. In no apparent acute distress Vitals:   07/06/18 0820  BP: (!) 145/97  Pulse: 79  Temp: (!) 97.5 F (36.4 C)  SpO2: 100%  Weight: 268 lb (121.6 kg)  Height: 5' 7"  (1.702 m)  Psych/Mental status: Alert, oriented x 3 (person, place, & time)       Eyes: PERLA Respiratory: No evidence of acute respiratory distress  Lumbar Spine Area Exam  Skin & Axial Inspection: No masses, redness, or swelling Alignment: Symmetrical Functional ROM: Unrestricted ROM       Stability: No instability detected Muscle Tone/Strength: Functionally intact. No obvious neuro-muscular anomalies detected. Sensory (Neurological): Unimpaired Palpation: No palpable anomalies          Gait & Posture Assessment  Ambulation: Unassisted Gait: Relatively normal for age and body habitus Posture: WNL   Lower Extremity Exam    Side: Right lower extremity  Side: Left lower extremity  Stability: No instability observed          Stability: No instability observed          Skin & Extremity Inspection: Positive color changes  Skin & Extremity Inspection: Positive color changes  Functional ROM: Unrestricted ROM                  Functional ROM: Unrestricted ROM                  Muscle Tone/Strength: Functionally intact. No obvious neuro-muscular anomalies detected.  Muscle Tone/Strength: Functionally intact. No obvious neuro-muscular anomalies detected.  Sensory (Neurological): Neuropathic pain pattern        Sensory (Neurological):  Neuropathic pain pattern            Palpation: No palpable anomalies  Palpation: No palpable anomalies   Assessment  Primary Diagnosis & Pertinent Problem List: The primary encounter diagnosis was Chronic bilateral low back pain with bilateral sciatica. Diagnoses of Chronic pain of both lower extremities (L>R), Neurogenic pain, Chronic pain syndrome, Long term current use of opiate analgesic, Pharmacologic therapy, Disorder of skeletal system, and Problems influencing health status were also pertinent to this visit.  Status Diagnosis  Controlled Persistent Persistent 1. Chronic bilateral low back pain with bilateral sciatica   2. Chronic pain of both lower extremities (L>R)   3. Neurogenic pain   4. Chronic pain syndrome   5. Long term current use of opiate analgesic   6. Pharmacologic therapy   7. Disorder of skeletal system   8. Problems influencing health status     Problems updated and reviewed during this visit: Problem  Pharmacologic Therapy  Disorder of Skeletal System  Problems Influencing Health Status   Plan of Care  Pharmacotherapy (Medications Ordered): Meds ordered this encounter  Medications  . HYDROcodone-acetaminophen  (NORCO) 10-325 MG tablet    Sig: Take 1 tablet by mouth every 6 (six) hours as needed for up to 30 days for severe pain.    Dispense:  120 tablet    Refill:  0    Do not place this medication, or any other prescription from our practice, on "Automatic Refill". Patient may have prescription filled one day early if pharmacy is closed on scheduled refill date.    Order Specific Question:   Supervising Provider    Answer:   Milinda Pointer [phone]  . DULoxetine (CYMBALTA) 30 MG capsule    Sig: Take 2 capsules (60 mg total) by mouth daily.    Dispense:  60 capsule    Refill:  1    Order Specific Question:   Supervising Provider    Answer:   Milinda Pointer [phone]  . lidocaine (XYLOCAINE) 5 % ointment    Sig: Apply 1 application topically 4 (four) times daily as needed.    Dispense:  50 g    Refill:  1    Pt requesting jar    Order Specific Question:   Supervising Provider    Answer:   Milinda Pointer [phone]  . HYDROcodone-acetaminophen (NORCO) 10-325 MG tablet    Sig: Take 1 tablet by mouth every 6 (six) hours as needed for up to 30 days for severe pain.    Dispense:  120 tablet    Refill:  0    Do not place this medication, or any other prescription from our practice, on "Automatic Refill". Patient may have prescription filled one day early if pharmacy is closed on scheduled refill date.    Order Specific Question:   Supervising Provider    Answer:   Milinda Pointer [[id number]]   New Prescriptions   No medications on file   Medications administered today: Rodman C. Dirosa had no medications administered during this visit. Lab-work, procedure(s), and/or referral(s): Orders Placed This Encounter  Procedures  . ToxASSURE Select 13 (MW), Urine  . Comp. Metabolic Panel (12)  . Magnesium  . Vitamin B12  . Sedimentation rate  . 25-Hydroxyvitamin D Lcms D2+D3  . C-reactive protein   Imaging and/or referral(s): None  Interventional therapies: Planned, scheduled,  and/or pending:   Not at this time.   Provider-requested follow-up: Return in about 2 months (around 09/04/2018) for MedMgmt.  Future Appointments  Date Time Provider [place]  08/28/2018  8:00 AM Vevelyn Francois, NP [facility] None   Primary Care Physician: Guadalupe Dawn, MD Location: [facility] Facility Note by: Vevelyn Francois NP Date: 07/06/2018; Time: 9:12 AM  Pain Score Disclaimer: We use the NRS-11 scale. This is a self-reported, subjective measurement of pain severity with only modest accuracy. It is used primarily to identify changes within a particular patient. It must be understood that outpatient pain scales are significantly less accurate that those used for research, where they can be applied under ideal controlled circumstances with minimal exposure to variables. In reality, the score is likely to be a combination of pain intensity and pain affect, where pain affect describes the degree of emotional arousal or changes in action readiness caused by the sensory experience of pain. Factors such as social and work situation, setting, emotional state, anxiety levels, expectation, and prior pain experience may influence pain perception and show large inter-individual differences that may also be affected by time variables.  Patient instructions provided during this appointment: Patient Instructions   ____________________________________________________________________________________________  Medication Rules  Purpose: To inform patients, and their family members, of our rules and regulations.  Applies to: All patients receiving prescriptions (written or electronic).  Pharmacy of record: Pharmacy where electronic prescriptions will be sent. If written prescriptions are taken to a different pharmacy, please inform the nursing staff. The pharmacy listed in the electronic medical record should be the one where you would like electronic prescriptions to be  sent.  Electronic prescriptions: In compliance with the [place] (STOP) Act of 2017 (Session Lanny Cramp [phone]), effective June 14, 2018, all controlled substances must be electronically prescribed. Calling prescriptions to the pharmacy will cease to exist.  Prescription refills: Only during scheduled appointments. Applies to all prescriptions.  NOTE: The following applies primarily to controlled substances (Opioid* Pain Medications).   Patient's responsibilities: 1. Pain Pills: Bring all pain pills to every appointment (except for procedure appointments). 2. Pill Bottles: Bring pills in original pharmacy bottle. Always bring the newest bottle. Bring bottle, even if empty. 3. Medication refills: You are responsible for knowing and keeping track of what medications you take and those you need refilled. The day before your appointment: write a list of all prescriptions that need to be refilled. The day of the appointment: give the list to the admitting nurse. Prescriptions will be written only during appointments. If you forget a medication: it will not be "Called in", "Faxed", or "electronically sent". You will need to get another appointment to get these prescribed. No early refills. Do not call asking to have your prescription filled early. 4. Prescription Accuracy: You are responsible for carefully inspecting your prescriptions before leaving our office. Have the discharge nurse carefully go over each prescription with you, before taking them home. Make sure that your name is accurately spelled, that your address is correct. Check the name and dose of your medication to make sure it is accurate. Check the number of pills, and the written instructions to make sure they are clear and accurate. Make sure that you are given enough medication to last until your next medication refill appointment. 5. Taking Medication: Take medication as prescribed. When it  comes to controlled substances, taking less pills or less frequently than prescribed is permitted and encouraged. Never take more pills than instructed. Never take medication more frequently than prescribed.  6. Inform other Doctors: Always inform, all of your healthcare providers, of all  the medications you take. 7. Pain Medication from other Providers: You are not allowed to accept any additional pain medication from any other Doctor or Healthcare provider. There are two exceptions to this rule. (see below) In the event that you require additional pain medication, you are responsible for notifying us, as stated below. 8. Medication Agreement: You are responsible for carefully reading and following our Medication Agreement. This must be signed before receiving any prescriptions from our practice. Safely store a copy of your signed Agreement. Violations to the Agreement will result in no further prescriptions. (Additional copies of our Medication Agreement are available upon request.) 9. Laws, Rules, & Regulations: All patients are expected to follow all Federal and [employer], [employer], Rules, [facility]. Ignorance of the Laws does not constitute a valid excuse. The use of any illegal substances is prohibited. 10. Adopted CDC guidelines & recommendations: Target dosing levels will be at or below 60 MME/day. Use of benzodiazepines** is not recommended.  Exceptions: There are only two exceptions to the rule of not receiving pain medications from other Healthcare Providers. 1. Exception #1 (Emergencies): In the event of an emergency (i.e.: accident requiring emergency care), you are allowed to receive additional pain medication. However, you are responsible for: As soon as you are able, call our office [phone], at any time of the day or night, and leave a message stating your name, the date and nature of the emergency, and the name and dose of the medication prescribed. In the event that your call  is answered by a member of our staff, make sure to document and save the date, time, and the name of the person that took your information.  2. Exception #2 (Planned Surgery): In the event that you are scheduled by another doctor or dentist to have any type of surgery or procedure, you are allowed (for a period no longer than 30 days), to receive additional pain medication, for the acute post-op pain. However, in this case, you are responsible for picking up a copy of our "Post-op Pain Management for Surgeons" handout, and giving it to your surgeon or dentist. This document is available at our office, and does not require an appointment to obtain it. Simply go to our office during business hours (Monday-Thursday from 8:00 AM to 4:00 PM) (Friday 8:00 AM to 12:00 Noon) or if you have a scheduled appointment with [place], prior to your surgery, and ask for it by name. In addition, you will need to provide [place] with your name, name of your surgeon, type of surgery, and date of procedure or surgery.  *Opioid medications include: morphine, codeine, oxycodone, oxymorphone, hydrocodone, hydromorphone, meperidine, tramadol, tapentadol, buprenorphine, fentanyl, methadone. **Benzodiazepine medications include: diazepam (Valium), alprazolam (Xanax), clonazepam (Klonopine), lorazepam (Ativan), clorazepate (Tranxene), chlordiazepoxide (Librium), estazolam (Prosom), oxazepam (Serax), temazepam (Restoril), triazolam (Halcion) (Last updated: 08/11/2017) ____________________________________________________________________________________________   BMI Assessment: Estimated body mass index is 41.97 kg/m as calculated from the following:   Height as of this encounter: 5' 7"  (1.702 m).   Weight as of this encounter: 268 lb (121.6 kg).  BMI interpretation table: BMI level Category Range association with higher incidence of chronic pain  <18 kg/m2 Underweight   18.5-24.9 kg/m2 Ideal body weight   25-29.9 kg/m2 Overweight  Increased incidence by 20%  30-34.9 kg/m2 Obese (Class I) Increased incidence by 68%  35-39.9 kg/m2 Severe obesity (Class II) Increased incidence by 136%  >40 kg/m2 Extreme obesity (Class III) Increased incidence by 254%   Patient's current BMI Ideal  Body weight  Body mass index is 41.97 kg/m. Ideal body weight: 66.1 kg (145 lb 11.6 oz) Adjusted ideal body weight: 88.3 kg (194 lb 10.2 oz)   BMI Readings from Last 4 Encounters:  07/06/18 41.97 kg/m  05/09/18 42.29 kg/m  04/05/18 41.05 kg/m  02/22/18 41.05 kg/m   Wt Readings from Last 4 Encounters:  07/06/18 268 lb (121.6 kg)  05/09/18 270 lb (122.5 kg)  04/05/18 270 lb (122.5 kg)  02/22/18 270 lb (122.5 kg)

## 2018-07-06 NOTE — Patient Instructions (Addendum)
____________________________________________________________________________________________  Medication Rules  Purpose: To inform patients, and their family members, of our rules and regulations.  Applies to: All patients receiving prescriptions (written or electronic).  Pharmacy of record: Pharmacy where electronic prescriptions will be sent. If written prescriptions are taken to a different pharmacy, please inform the nursing staff. The pharmacy listed in the electronic medical record should be the one where you would like electronic prescriptions to be sent.  Electronic prescriptions: In compliance with the [place] Strengthen Opioid Misuse Prevention (STOP) Act of 2017 (Session Law 2017-74/H243), effective June 14, 2018, all controlled substances must be electronically prescribed. Calling prescriptions to the pharmacy will cease to exist.  Prescription refills: Only during scheduled appointments. Applies to all prescriptions.  NOTE: The following applies primarily to controlled substances (Opioid* Pain Medications).   Patient's responsibilities: 1. Pain Pills: Bring all pain pills to every appointment (except for procedure appointments). 2. Pill Bottles: Bring pills in original pharmacy bottle. Always bring the newest bottle. Bring bottle, even if empty. 3. Medication refills: You are responsible for knowing and keeping track of what medications you take and those you need refilled. The day before your appointment: write a list of all prescriptions that need to be refilled. The day of the appointment: give the list to the admitting nurse. Prescriptions will be written only during appointments. If you forget a medication: it will not be "Called in", "Faxed", or "electronically sent". You will need to get another appointment to get these prescribed. No early refills. Do not call asking to have your prescription filled early. 4. Prescription Accuracy: You are responsible for  carefully inspecting your prescriptions before leaving our office. Have the discharge nurse carefully go over each prescription with you, before taking them home. Make sure that your name is accurately spelled, that your address is correct. Check the name and dose of your medication to make sure it is accurate. Check the number of pills, and the written instructions to make sure they are clear and accurate. Make sure that you are given enough medication to last until your next medication refill appointment. 5. Taking Medication: Take medication as prescribed. When it comes to controlled substances, taking less pills or less frequently than prescribed is permitted and encouraged. Never take more pills than instructed. Never take medication more frequently than prescribed.  6. Inform other Doctors: Always inform, all of your healthcare providers, of all the medications you take. 7. Pain Medication from other Providers: You are not allowed to accept any additional pain medication from any other Doctor or Healthcare provider. There are two exceptions to this rule. (see below) In the event that you require additional pain medication, you are responsible for notifying us, as stated below. 8. Medication Agreement: You are responsible for carefully reading and following our Medication Agreement. This must be signed before receiving any prescriptions from our practice. Safely store a copy of your signed Agreement. Violations to the Agreement will result in no further prescriptions. (Additional copies of our Medication Agreement are available upon request.) 9. Laws, Rules, & Regulations: All patients are expected to follow all Federal and State Laws, Statutes, Rules, & Regulations. Ignorance of the Laws does not constitute a valid excuse. The use of any illegal substances is prohibited. 10. Adopted CDC guidelines & recommendations: Target dosing levels will be at or below 60 MME/day. Use of benzodiazepines** is not  recommended.  Exceptions: There are only two exceptions to the rule of not receiving pain medications from other Healthcare Providers. 1.   Exception #1 (Emergencies): In the event of an emergency (i.e.: accident requiring emergency care), you are allowed to receive additional pain medication. However, you are responsible for: As soon as you are able, call our office [phone], at any time of the day or night, and leave a message stating your name, the date and nature of the emergency, and the name and dose of the medication prescribed. In the event that your call is answered by a member of our staff, make sure to document and save the date, time, and the name of the person that took your information.  2. Exception #2 (Planned Surgery): In the event that you are scheduled by another doctor or dentist to have any type of surgery or procedure, you are allowed (for a period no longer than 30 days), to receive additional pain medication, for the acute post-op pain. However, in this case, you are responsible for picking up a copy of our "Post-op Pain Management for Surgeons" handout, and giving it to your surgeon or dentist. This document is available at our office, and does not require an appointment to obtain it. Simply go to our office during business hours (Monday-Thursday from 8:00 AM to 4:00 PM) (Friday 8:00 AM to 12:00 Noon) or if you have a scheduled appointment with [place], prior to your surgery, and ask for it by name. In addition, you will need to provide [place] with your name, name of your surgeon, type of surgery, and date of procedure or surgery.  *Opioid medications include: morphine, codeine, oxycodone, oxymorphone, hydrocodone, hydromorphone, meperidine, tramadol, tapentadol, buprenorphine, fentanyl, methadone. **Benzodiazepine medications include: diazepam (Valium), alprazolam (Xanax), clonazepam (Klonopine), lorazepam (Ativan), clorazepate (Tranxene), chlordiazepoxide (Librium), estazolam (Prosom),  oxazepam (Serax), temazepam (Restoril), triazolam (Halcion) (Last updated: 08/11/2017) ____________________________________________________________________________________________   BMI Assessment: Estimated body mass index is 41.97 kg/m as calculated from the following:   Height as of this encounter: 5\' 7"  (1.702 m).   Weight as of this encounter: 268 lb (121.6 kg).  BMI interpretation table: BMI level Category Range association with higher incidence of chronic pain  <18 kg/m2 Underweight   18.5-24.9 kg/m2 Ideal body weight   25-29.9 kg/m2 Overweight Increased incidence by 20%  30-34.9 kg/m2 Obese (Class I) Increased incidence by 68%  35-39.9 kg/m2 Severe obesity (Class II) Increased incidence by 136%  >40 kg/m2 Extreme obesity (Class III) Increased incidence by 254%   Patient's current BMI Ideal Body weight  Body mass index is 41.97 kg/m. Ideal body weight: 66.1 kg (145 lb 11.6 oz) Adjusted ideal body weight: 88.3 kg (194 lb 10.2 oz)   BMI Readings from Last 4 Encounters:  07/06/18 41.97 kg/m  05/09/18 42.29 kg/m  04/05/18 41.05 kg/m  02/22/18 41.05 kg/m   Wt Readings from Last 4 Encounters:  07/06/18 268 lb (121.6 kg)  05/09/18 270 lb (122.5 kg)  04/05/18 270 lb (122.5 kg)  02/22/18 270 lb (122.5 kg)

## 2018-07-11 LAB — COMP. METABOLIC PANEL (12)
ALK PHOS: 82 IU/L (ref 39–117)
AST: 29 IU/L (ref 0–40)
Albumin/Globulin Ratio: 1.8 (ref 1.2–2.2)
Albumin: 4.4 g/dL (ref 4.0–5.0)
BUN/Creatinine Ratio: 25 — ABNORMAL HIGH (ref 9–20)
BUN: 22 mg/dL (ref 6–24)
Bilirubin Total: 0.3 mg/dL (ref 0.0–1.2)
CALCIUM: 10.1 mg/dL (ref 8.7–10.2)
CREATININE: 0.87 mg/dL (ref 0.76–1.27)
Chloride: 96 mmol/L (ref 96–106)
GFR, EST AFRICAN AMERICAN: 116 mL/min/{1.73_m2} (ref >59)
GFR, EST NON AFRICAN AMERICAN: 101 mL/min/{1.73_m2} (ref >59)
GLUCOSE: 202 mg/dL — AB (ref 65–99)
Globulin, Total: 2.5 g/dL (ref 1.5–4.5)
Potassium: 4.5 mmol/L (ref 3.5–5.2)
Sodium: 133 mmol/L — ABNORMAL LOW (ref 134–144)
Total Protein: 6.9 g/dL (ref 6.0–8.5)

## 2018-07-11 LAB — 25-HYDROXY VITAMIN D LCMS D2+D3
25-Hydroxy, Vitamin D-2: <1 ng/mL
25-Hydroxy, Vitamin D: 40 ng/mL

## 2018-07-11 LAB — 25-HYDROXYVITAMIN D LCMS D2+D3: 25-HYDROXY, VITAMIN D-3: 40 ng/mL

## 2018-07-11 LAB — TOXASSURE SELECT 13 (MW), URINE

## 2018-07-11 LAB — MAGNESIUM: Magnesium: 2.1 mg/dL (ref 1.6–2.3)

## 2018-07-11 LAB — SEDIMENTATION RATE: SED RATE: 65 mm/h — AB (ref 0–30)

## 2018-07-11 LAB — C-REACTIVE PROTEIN: CRP: 5 mg/L (ref 0–10)

## 2018-07-11 LAB — VITAMIN B12: VITAMIN B 12: 1111 pg/mL (ref 232–1245)

## 2018-08-04 ENCOUNTER — Other Ambulatory Visit: Payer: Self-pay | Admitting: Family Medicine

## 2018-08-09 ENCOUNTER — Other Ambulatory Visit: Payer: Self-pay

## 2018-08-09 DIAGNOSIS — L219 Seborrheic dermatitis, unspecified: Secondary | ICD-10-CM

## 2018-08-09 MED ORDER — KETOCONAZOLE 2 % EX SHAM: 1.0000 "application " | MEDICATED_SHAMPOO | Freq: Every day | CUTANEOUS | 0 refills | Status: DC

## 2018-08-28 ENCOUNTER — Ambulatory Visit: Payer: Medicare Other | Attending: Nurse Practitioner | Admitting: Nurse Practitioner

## 2018-08-28 ENCOUNTER — Ambulatory Visit (INDEPENDENT_AMBULATORY_CARE_PROVIDER_SITE_OTHER): Payer: Medicare Other | Admitting: Family Medicine

## 2018-08-28 ENCOUNTER — Encounter: Payer: Self-pay | Admitting: Family Medicine

## 2018-08-28 ENCOUNTER — Other Ambulatory Visit: Payer: Self-pay

## 2018-08-28 ENCOUNTER — Encounter: Payer: Self-pay | Admitting: Nurse Practitioner

## 2018-08-28 VITALS — BP 130/90 | HR 77 | Temp 98.1°F | Ht 68.0 in | Wt 287.6 lb

## 2018-08-28 VITALS — BP 155/101 | HR 83 | Temp 98.0°F | Ht 68.0 in | Wt 275.0 lb

## 2018-08-28 DIAGNOSIS — M792 Neuralgia and neuritis, unspecified: Secondary | ICD-10-CM | POA: Diagnosis not present

## 2018-08-28 DIAGNOSIS — E1142 Type 2 diabetes mellitus with diabetic polyneuropathy: Secondary | ICD-10-CM | POA: Diagnosis not present

## 2018-08-28 DIAGNOSIS — M79604 Pain in right leg: Secondary | ICD-10-CM | POA: Diagnosis not present

## 2018-08-28 DIAGNOSIS — M5442 Lumbago with sciatica, left side: Secondary | ICD-10-CM | POA: Diagnosis not present

## 2018-08-28 DIAGNOSIS — M5441 Lumbago with sciatica, right side: Secondary | ICD-10-CM | POA: Insufficient documentation

## 2018-08-28 DIAGNOSIS — G8929 Other chronic pain: Secondary | ICD-10-CM | POA: Diagnosis not present

## 2018-08-28 DIAGNOSIS — G894 Chronic pain syndrome: Secondary | ICD-10-CM | POA: Insufficient documentation

## 2018-08-28 DIAGNOSIS — M79605 Pain in left leg: Secondary | ICD-10-CM | POA: Insufficient documentation

## 2018-08-28 DIAGNOSIS — E785 Hyperlipidemia, unspecified: Secondary | ICD-10-CM | POA: Diagnosis not present

## 2018-08-28 DIAGNOSIS — I10 Essential (primary) hypertension: Secondary | ICD-10-CM | POA: Diagnosis not present

## 2018-08-28 LAB — GLUCOSE, POCT (MANUAL RESULT ENTRY): POC Glucose: 251 mg/dl — AB (ref 70–99)

## 2018-08-28 LAB — POCT GLYCOSYLATED HEMOGLOBIN (HGB A1C): HbA1c, POC (controlled diabetic range): 9.4 % — AB (ref 0.0–7.0)

## 2018-08-28 MED ORDER — LOSARTAN POTASSIUM 50 MG PO TABS: 50.0000 mg | ORAL_TABLET | Freq: Every day | ORAL | 3 refills | Status: DC

## 2018-08-28 MED ORDER — HYDROCODONE-ACETAMINOPHEN 10-325 MG PO TABS: 1.0000 | ORAL_TABLET | Freq: Four times a day (QID) | ORAL | 0 refills | Status: DC | PRN

## 2018-08-28 MED ORDER — HYDROCODONE-ACETAMINOPHEN 10-325 MG PO TABS: 1.0000 | ORAL_TABLET | Freq: Four times a day (QID) | ORAL | 0 refills | Status: AC | PRN

## 2018-08-28 MED ORDER — METFORMIN HCL ER 500 MG PO TB24: 500.0000 mg | ORAL_TABLET | Freq: Every day | ORAL | 3 refills | Status: DC

## 2018-08-28 MED ORDER — PRAVASTATIN SODIUM 40 MG PO TABS: 40.0000 mg | ORAL_TABLET | Freq: Every day | ORAL | 3 refills | Status: DC

## 2018-08-28 NOTE — Progress Notes (Signed)
Nursing Pain Medication Assessment:  Safety precautions to be maintained throughout the outpatient stay will include: orient to surroundings, keep bed in low position, maintain call bell within reach at all times, provide assistance with transfer out of bed and ambulation.  Medication Inspection Compliance: Pill count conducted under aseptic conditions, in front of the patient. Neither the pills nor the bottle was removed from the patient's sight at any time. Once count was completed pills were immediately returned to the patient in their original bottle.  Medication: Hydrocodone/APAP Pill/Patch Count: 48 of 120 pills remain Pill/Patch Appearance: Markings consistent with prescribed medication Bottle Appearance: Standard pharmacy container. Clearly labeled. Filled Date: 2 / 26 / 2020 Last Medication intake:  Today

## 2018-08-28 NOTE — Progress Notes (Signed)
Patient's Name: [name]  MRN: [id number]  Referring Provider: Guadalupe Dawn, MD  DOB: [birth year]  PCP: Guadalupe Dawn, MD  DOS: 08/28/2018  Note by: Dionisio David, NP  Service setting: Ambulatory outpatient  Specialty: Interventional Pain Management  Location: [facility] (AMB) Pain Management Facility    Patient type: Established   HPI  Reason for Visit: [name] is a 53 y.o. year old, male patient, who comes today with a chief complaint of Back Pain Last Appointment: His last appointment at our practice was on 07/06/2018. I last saw him on 07/06/2018.  Pain Assessment: Today, [name] describes the severity of the Chronic pain as a 5 /10. He indicates the location/referral of the pain to be Back Left, Lower/pain radiaties down both leg, worse on the left. Onset was: More than a month ago. The quality of pain is described as Dull, Aching, Sharp, Constant. Temporal description, or timing of pain is: Constant. Possible modifying factors: medication, heat. [name]  height is 5' 8"  (1.727 m) and weight is 275 lb (124.7 kg). His temperature is 98 F (36.7 C). His blood pressure is 155/101 (abnormal) and his pulse is 83. His oxygen saturation is 98%. He is having increased pain in his toe on his left foot.  He admits that is a shooting pain that, comes and goes.  He has peripheral neuropathy and venous stasis.  He admits that he has seen a podiatrist in the past however is been many years ago.  He has uncontrolled diabetes.  His last A1c was 10.1 in March 2019.  Controlled Substance Pharmacotherapy Assessment REMS (Risk Evaluation and Mitigation Strategy)  Analgesic:Hydrocodone 10 mg 4 times daily as needed, quantity 34-monthMME/day:419mday.  BrChauncey FischerRN  08/28/2018  8:16 AM  Sign when Signing Visit Nursing Pain Medication Assessment:  Safety precautions to be maintained throughout the outpatient stay will include: orient to surroundings, keep bed in low position,  maintain call bell within reach at all times, provide assistance with transfer out of bed and ambulation.  Medication Inspection Compliance: Pill count conducted under aseptic conditions, in front of the patient. Neither the pills nor the bottle was removed from the patient's sight at any time. Once count was completed pills were immediately returned to the patient in their original bottle.  Medication: Hydrocodone/APAP Pill/Patch Count: 48 of 120 pills remain Pill/Patch Appearance: Markings consistent with prescribed medication Bottle Appearance: Standard pharmacy container. Clearly labeled. Filled Date: 2 / 26 / 2020 Last Medication intake:  Today   Pharmacokinetics: Liberation and absorption (onset of action): WNL Distribution (time to peak effect): WNL Metabolism and excretion (duration of action): WNL         Pharmacodynamics: Desired effects: Analgesia: [name] >50% benefit. Functional ability: Patient reports that medication allows him to accomplish basic ADLs Clinically meaningful improvement in function (CMIF): Sustained CMIF goals met Perceived effectiveness: Described as relatively effective, allowing for increase in activities of daily living (ADL) Undesirable effects: Side-effects or Adverse reactions: None reported Monitoring: [place] PMP: Online review of the past 1258-monthriod conducted. Compliant with practice rules and regulations Last UDS on record: Summary  Date Value Ref Range Status  07/06/2018 FINAL  Final    Comment:    ==================================================================== TOXASSURE SELSELECT 44W) ==================================================================== Test                             Result       Flag  Units Drug Present and Declared for Prescription Verification   Hydrocodone                    2404         EXPECTED   ng/mg creat   Hydromorphone                  213          EXPECTED   ng/mg creat   Norhydrocodone                  1359         EXPECTED   ng/mg creat    Sources of hydrocodone include scheduled prescription    medications. Hydromorphone and norhydrocodone are expected    metabolites of hydrocodone. Hydromorphone is also available as a    scheduled prescription medication. ==================================================================== Test                      Result    Flag   Units      Ref Range   Creatinine              78               mg/dL      >=20 ==================================================================== Declared Medications:  The flagging and interpretation on this report are based on the  following declared medications.  Unexpected results may arise from  inaccuracies in the declared medications.  **Note: The testing scope of this panel includes these medications:  Hydrocodone (Norco)  **Note: The testing scope of this panel does not include following  reported medications:  Acetaminophen (Norco)  Aspirin (Aspirin 81)  Duloxetine (Cymbalta)  Insulin (Humalog)  Insulin (Lantus)  Lidocaine (Xylocaine)  Multivitamin  Triamcinolone (Kenalog) ==================================================================== For clinical consultation, please call [phone]. ====================================================================    UDS interpretation: Compliant          Medication Assessment Form: Reviewed. Patient indicates being compliant with therapy Treatment compliance: Compliant Risk Assessment Profile: Aberrant behavior: See initial evaluations. None observed or detected today Comorbid factors increasing risk of overdose: See initial evaluation. No additional risks detected today Opioid risk tool (ORT):  Opioid Risk  08/28/2018  Alcohol 0  Illegal Drugs 0  Rx Drugs 0  Alcohol 0  Illegal Drugs 0  Rx Drugs 0  Age between 16-45 years  0  History of Preadolescent Sexual Abuse 0  Psychological Disease 0  ADD -  OCD -  Bipolar -   Depression -  Opioid Risk Tool Scoring 0  Opioid Risk Interpretation Low Risk    ORT Scoring interpretation table:  Score <3 = Low Risk for SUD  Score between 4-7 = Moderate Risk for SUD  Score >8 = High Risk for Opioid Abuse   Risk of substance use disorder (SUD): Low  Risk Mitigation Strategies:  Patient Counseling: Covered Patient-Prescriber Agreement (PPA): Present and active  Notification to other healthcare providers: Done  Pharmacologic Plan: No change in therapy, at this time.             ROS  Constitutional: Denies any fever or chills Gastrointestinal: No reported hemesis, hematochezia, vomiting, or acute GI distress Musculoskeletal: Denies any acute onset joint swelling, redness, loss of ROM, or weakness Neurological: No reported episodes of acute onset apraxia, aphasia, dysarthria, agnosia, amnesia, paralysis, loss of coordination, or loss of consciousness  Medication Review  DULoxetine, HYDROcodone-acetaminophen, SYRINGE-NEEDLE (DISP) 3 ML, aspirin, insulin glargine, insulin lispro, ketoconazole, lidocaine,  losartan, metFORMIN, multivitamin with minerals, pravastatin, and triamcinolone cream  History Review  Allergy: [name] is allergic to penicillins. Drug: [name]  reports no history of drug use. Alcohol:  reports current alcohol use. Tobacco:  reports that he quit smoking about 6 years ago. His smoking use included cigarettes. He has a 48.00 pack-year smoking history. He quit smokeless tobacco use about 7 years ago.  His smokeless tobacco use included snuff. Social: [name]  reports that he quit smoking about 6 years ago. His smoking use included cigarettes. He has a 48.00 pack-year smoking history. He quit smokeless tobacco use about 7 years ago.  His smokeless tobacco use included snuff. He reports current alcohol use. He reports that he does not use drugs. Medical:  has a past medical history of Arthritis, Bilateral edema of lower extremity, Bulge of  cervical disc without myelopathy, Chronic low back pain, Disorder of subcutaneous tissue, Environmental allergies, History of cervical fracture, History of multiple concussions, History of pneumothorax, History of seizures (pt states takes gabapentin to control seizures and for neuropathy---  followed by PCP  dr  Lorenso Courier ([facility])  seizures are note mentioned as hx in her last note or previous notes), Hypertension, OSA on CPAP, Peripheral neuropathy, Recurrent boils, Type 2 diabetes mellitus ([place]), and Wears glasses. Surgical: [name]  has a past surgical history that includes I & D RIGHT INDEX FINGER PURULENT FLEXOR TENOSYNOVITIS (08-23-2001); Lumbar disc surgery (02-08-2002); transthoracic echocardiogram (05-03-2007); REATTACHMENT LEFT INDEX FINGER (1999); Appendectomy (age 35); and Mass excision (N/A, 08/07/2015). Family: family history includes Asthma in his maternal aunt, maternal grandfather, maternal grandmother, maternal uncle, and mother; COPD in his mother; Diabetes in his maternal aunt, maternal grandfather, maternal grandmother, maternal uncle, and mother; Obesity in his maternal aunt, maternal grandfather, maternal grandmother, maternal uncle, and mother. Problem List: [name] has Chronic pain of both lower extremities (L>R); Chronic bilateral low back pain with bilateral sciatica; Chronic pain syndrome; and Neurogenic pain on their pertinent problem list.  Lab Review  Kidney Function Lab Results  Component Value Date   BUN 22 07/06/2018   CREATININE 0.87 07/06/2018   BCR 25 (H) 07/06/2018   GFRAA 116 07/06/2018   GFRNONAA 101 07/06/2018  Liver Function Lab Results  Component Value Date   AST 29 07/06/2018   ALT 25 03/25/2015   ALBUMIN 4.4 07/06/2018  Note: Above Lab results reviewed.  Imaging Review  MR LUMBAR SPINE WO CONTRAST CLINICAL DATA:  Low back pain  EXAM: MRI LUMBAR SPINE WITHOUT CONTRAST  TECHNIQUE: Multiplanar, multisequence MR imaging of the  lumbar spine was performed. No intravenous contrast was administered.  COMPARISON:  Lumbar MRI 02/11/2015  FINDINGS: Segmentation:  Normal  Alignment:  Normal  Vertebrae: Hemangioma L1 vertebral body. Negative for fracture or mass. Discogenic changes at L4-5  Conus medullaris: Extends to the L1 level and appears normal.  Paraspinal and other soft tissues: Negative  Disc levels:  T12-L1: Broad-based central disc protrusion unchanged. No significant stenosis  L1-2:  Negative  L2-3:  Negative  L3-4: Disc degeneration with disc bulging and mild endplate spurring. No significant stenosis.  L4-5: Postop laminectomy on the left. Disc degeneration and diffuse endplate spurring. Bilateral mild facet hypertrophy. Mild spinal stenosis, unchanged. Mild subarticular and foraminal stenosis due to spurring unchanged  L5-S1:  Negative  IMPRESSION: Disc bulging L3-4 unchanged  Postop laminectomy left L4-5. Disc degeneration and spurring. Mild spinal stenosis unchanged. Mild subarticular and foraminal stenosis unchanged  Overall, no change from the  prior MRI of 02/11/2015  Electronically Signed   By: Franchot Gallo M.D.   On: 12/03/2016 09:16 Note: Reviewed        Physical Exam  General appearance: Well nourished, well developed, and well hydrated. In no apparent acute distress Mental status: Alert, oriented x 3 (person, place, & time)       Respiratory: No evidence of acute respiratory distress Eyes: PERLA Vitals: BP (!) 155/101   Pulse 83   Temp 98 F (36.7 C)   Ht 5' 8"  (1.727 m)   Wt 275 lb (124.7 kg)   SpO2 98%   BMI 41.81 kg/m  BMI: Estimated body mass index is 41.81 kg/m as calculated from the following:   Height as of this encounter: 5' 8"  (1.727 m).   Weight as of this encounter: 275 lb (124.7 kg). Ideal: Ideal body weight: 68.4 kg (150 lb 12.7 oz) Adjusted ideal body weight: 90.9 kg (200 lb 7.6 oz) Lumbar Spine Area Exam  Skin & Axial Inspection: No  masses, redness, or swelling Alignment: Symmetrical Functional ROM: Unrestricted ROM       Stability: No instability detected Muscle Tone/Strength: Functionally intact. No obvious neuro-muscular anomalies detected. Sensory (Neurological): Unimpaired Palpation: No palpable anomalies        Gait & Posture Assessment  Ambulation: Patient ambulates using a cane Gait: Relatively normal for age and body habitus Posture: WNL  Lower Extremity Exam    Side: Right lower extremity  Side: Left lower extremity  Stability: No instability observed          Stability: No instability observed          Skin & Extremity Inspection: Venous stasis edema  Skin & Extremity Inspection: Venous stasis edema  Functional ROM: Unrestricted ROM                  Functional ROM: Unrestricted ROM        With abrasions no discoloration of great toe          Muscle Tone/Strength: Functionally intact. No obvious neuro-muscular anomalies detected.  Muscle Tone/Strength: Functionally intact. No obvious neuro-muscular anomalies detected.  Sensory (Neurological): Neuropathic pain pattern        Sensory (Neurological): Neurogenic pain pattern top of foot & big toe (L5)      Palpation: No palpable anomalies  Palpation: No palpable anomalies   Assessment   Status Diagnosis  Worsening Persistent Worsening 1. Chronic pain of both lower extremities (L>R)   2. Chronic bilateral low back pain with bilateral sciatica   3. Neurogenic pain   4. Chronic pain syndrome      Updated Problems: No problems updated.  Plan of Care  Pharmacotherapy (Medications Ordered): Meds ordered this encounter  Medications  . HYDROcodone-acetaminophen (NORCO) 10-325 MG tablet    Sig: Take 1 tablet by mouth every 6 (six) hours as needed for up to 30 days for severe pain.    Dispense:  120 tablet    Refill:  0    Do not place this medication, or any other prescription from our practice, on "Automatic Refill". Patient may have prescription  filled one day early if pharmacy is closed on scheduled refill date.    Order Specific Question:   Supervising Provider    Answer:   Gillis Santa [[id number]]  . HYDROcodone-acetaminophen (NORCO) 10-325 MG tablet    Sig: Take 1 tablet by mouth every 6 (six) hours as needed for up to 30 days for severe pain.    Dispense:  120 tablet    Refill:  0    Do not place this medication, or any other prescription from our practice, on "Automatic Refill". Patient may have prescription filled one day early if pharmacy is closed on scheduled refill date.    Order Specific Question:   Supervising Provider    Answer:   Gillis Santa [[id number]]   Administered today: Herbert Seta. [place] had no medications administered during this visit.  Orders:  No orders of the defined types were placed in this encounter.  Follow-up plan:   Return in about 2 months (around 10/28/2018) for MedMgmt.  Urged patient to follow-up with podiatry secondary to his uncontrolled diabetes and not wanting any interventional therapy at this time.   Interventional options:  Note by: Dionisio David, NP Date: 08/28/2018; Time: 12:43 PM

## 2018-08-28 NOTE — Patient Instructions (Addendum)
____________________________________________________________________________________________  Medication Rules  Purpose: To inform patients, and their family members, of our rules and regulations.  Applies to: All patients receiving prescriptions (written or electronic).  Pharmacy of record: Pharmacy where electronic prescriptions will be sent. If written prescriptions are taken to a different pharmacy, please inform the nursing staff. The pharmacy listed in the electronic medical record should be the one where you would like electronic prescriptions to be sent.  Electronic prescriptions: In compliance with the [place] Strengthen Opioid Misuse Prevention (STOP) Act of 2017 (Session Law 2017-74/H243), effective June 14, 2018, all controlled substances must be electronically prescribed. Calling prescriptions to the pharmacy will cease to exist.  Prescription refills: Only during scheduled appointments. Applies to all prescriptions.  NOTE: The following applies primarily to controlled substances (Opioid* Pain Medications).   Patient's responsibilities: 1. Pain Pills: Bring all pain pills to every appointment (except for procedure appointments). 2. Pill Bottles: Bring pills in original pharmacy bottle. Always bring the newest bottle. Bring bottle, even if empty. 3. Medication refills: You are responsible for knowing and keeping track of what medications you take and those you need refilled. The day before your appointment: write a list of all prescriptions that need to be refilled. The day of the appointment: give the list to the admitting nurse. Prescriptions will be written only during appointments. No prescriptions will be written on procedure days. If you forget a medication: it will not be "Called in", "Faxed", or "electronically sent". You will need to get another appointment to get these prescribed. No early refills. Do not call asking to have your prescription filled  early. 4. Prescription Accuracy: You are responsible for carefully inspecting your prescriptions before leaving our office. Have the discharge nurse carefully go over each prescription with you, before taking them home. Make sure that your name is accurately spelled, that your address is correct. Check the name and dose of your medication to make sure it is accurate. Check the number of pills, and the written instructions to make sure they are clear and accurate. Make sure that you are given enough medication to last until your next medication refill appointment. 5. Taking Medication: Take medication as prescribed. When it comes to controlled substances, taking less pills or less frequently than prescribed is permitted and encouraged. Never take more pills than instructed. Never take medication more frequently than prescribed.  6. Inform other Doctors: Always inform, all of your healthcare providers, of all the medications you take. 7. Pain Medication from other Providers: You are not allowed to accept any additional pain medication from any other Doctor or Healthcare provider. There are two exceptions to this rule. (see below) In the event that you require additional pain medication, you are responsible for notifying us, as stated below. 8. Medication Agreement: You are responsible for carefully reading and following our Medication Agreement. This must be signed before receiving any prescriptions from our practice. Safely store a copy of your signed Agreement. Violations to the Agreement will result in no further prescriptions. (Additional copies of our Medication Agreement are available upon request.) 9. Laws, Rules, & Regulations: All patients are expected to follow all Federal and State Laws, Statutes, Rules, & Regulations. Ignorance of the Laws does not constitute a valid excuse. The use of any illegal substances is prohibited. 10. Adopted CDC guidelines & recommendations: Target dosing levels will be  at or below 60 MME/day. Use of benzodiazepines** is not recommended.  Exceptions: There are only two exceptions to the rule of not   receiving pain medications from other Healthcare Providers. 1. Exception #1 (Emergencies): In the event of an emergency (i.e.: accident requiring emergency care), you are allowed to receive additional pain medication. However, you are responsible for: As soon as you are able, call our office [phone], at any time of the day or night, and leave a message stating your name, the date and nature of the emergency, and the name and dose of the medication prescribed. In the event that your call is answered by a member of our staff, make sure to document and save the date, time, and the name of the person that took your information.  2. Exception #2 (Planned Surgery): In the event that you are scheduled by another doctor or dentist to have any type of surgery or procedure, you are allowed (for a period no longer than 30 days), to receive additional pain medication, for the acute post-op pain. However, in this case, you are responsible for picking up a copy of our "Post-op Pain Management for Surgeons" handout, and giving it to your surgeon or dentist. This document is available at our office, and does not require an appointment to obtain it. Simply go to our office during business hours (Monday-Thursday from 8:00 AM to 4:00 PM) (Friday 8:00 AM to 12:00 Noon) or if you have a scheduled appointment with us, prior to your surgery, and ask for it by name. In addition, you will need to provide us with your name, name of your surgeon, type of surgery, and date of procedure or surgery.  *Opioid medications include: morphine, codeine, oxycodone, oxymorphone, hydrocodone, hydromorphone, meperidine, tramadol, tapentadol, buprenorphine, fentanyl, methadone. **Benzodiazepine medications include: diazepam (Valium), alprazolam (Xanax), clonazepam (Klonopine), lorazepam (Ativan), clorazepate  (Tranxene), chlordiazepoxide (Librium), estazolam (Prosom), oxazepam (Serax), temazepam (Restoril), triazolam (Halcion) (Last updated: 08/11/2017) ____________________________________________________________________________________________    

## 2018-08-28 NOTE — Progress Notes (Signed)
1 

## 2018-08-28 NOTE — Patient Instructions (Addendum)
See [place] again in two weeks.  Dr. Primitivo Gauze is now your regular doctor. In two weeks we will need to do blood work to check your kidneys.   Start your blood pressure medicine, losartin, and your diabetic medicine, metformin, right away.  In one week, if no diarrhea, increase your metformin to two pills each morning.  Hold off on the cholesterol medicine, pravastatin, until we get metformin sorted out.  If you have side effects to either, I want to know which medicine is causing the side effects.

## 2018-08-29 ENCOUNTER — Encounter: Payer: Self-pay | Admitting: Family Medicine
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
## 2018-08-29 NOTE — Assessment & Plan Note (Signed)
Start ARB.  Will need BMP at FU visit.

## 2018-08-29 NOTE — Assessment & Plan Note (Signed)
Add back metformin slowly to treat insulin resistence.

## 2018-08-29 NOTE — Assessment & Plan Note (Signed)
Hx of elevated triglycerides.  Will need FLP Will start statin due to DM even though it won't touch triglycerides.

## 2018-08-29 NOTE — Assessment & Plan Note (Signed)
Discussed diet and exercise.  Hopefully, metformin will help.

## 2018-08-31 ENCOUNTER — Other Ambulatory Visit: Payer: Self-pay | Admitting: Family Medicine

## 2018-09-11 ENCOUNTER — Telehealth: Payer: Medicare Other

## 2018-09-11 ENCOUNTER — Ambulatory Visit: Payer: Medicare Other | Admitting: Family Medicine

## 2018-09-11 ENCOUNTER — Telehealth: Payer: Self-pay | Admitting: Family Medicine

## 2018-09-11 ENCOUNTER — Other Ambulatory Visit: Payer: Self-pay

## 2018-09-11 ENCOUNTER — Other Ambulatory Visit: Payer: Self-pay | Admitting: Internal Medicine

## 2018-09-11 NOTE — Telephone Encounter (Signed)
Attempted to call patient x4 on both mobile and home phone.  Voicemail has been left to call clinic back.  Will forward to PCP as well as telephone providers for tomorrow to see if they are able to reach patient.  Orpah Clinton, PGY-2 [facility] 09/11/2018 3:59 PM

## 2018-09-12 ENCOUNTER — Telehealth: Payer: Self-pay | Admitting: Family Medicine

## 2018-09-12 NOTE — Telephone Encounter (Signed)
No answer. LVM with call back number to clinic.

## 2018-09-12 NOTE — Telephone Encounter (Signed)
LVM with call back number to clinic 09/12/18.

## 2018-09-26 ENCOUNTER — Other Ambulatory Visit: Payer: Self-pay | Admitting: Family Medicine

## 2018-10-26 ENCOUNTER — Ambulatory Visit: Payer: Medicare Other | Attending: Nurse Practitioner | Admitting: Nurse Practitioner

## 2018-10-26 ENCOUNTER — Other Ambulatory Visit: Payer: Self-pay

## 2018-10-26 DIAGNOSIS — M5442 Lumbago with sciatica, left side: Secondary | ICD-10-CM | POA: Diagnosis not present

## 2018-10-26 DIAGNOSIS — M79604 Pain in right leg: Secondary | ICD-10-CM | POA: Diagnosis not present

## 2018-10-26 DIAGNOSIS — M47816 Spondylosis without myelopathy or radiculopathy, lumbar region: Secondary | ICD-10-CM | POA: Diagnosis not present

## 2018-10-26 DIAGNOSIS — G894 Chronic pain syndrome: Secondary | ICD-10-CM | POA: Diagnosis not present

## 2018-10-26 DIAGNOSIS — M792 Neuralgia and neuritis, unspecified: Secondary | ICD-10-CM

## 2018-10-26 DIAGNOSIS — M79605 Pain in left leg: Secondary | ICD-10-CM

## 2018-10-26 DIAGNOSIS — M5441 Lumbago with sciatica, right side: Secondary | ICD-10-CM

## 2018-10-26 DIAGNOSIS — G8929 Other chronic pain: Secondary | ICD-10-CM

## 2018-10-26 MED ORDER — HYDROCODONE-ACETAMINOPHEN 10-325 MG PO TABS: 1.0000 | ORAL_TABLET | Freq: Four times a day (QID) | ORAL | 0 refills | Status: DC | PRN

## 2018-10-26 MED ORDER — HYDROCODONE-ACETAMINOPHEN 10-325 MG PO TABS: 1.0000 | ORAL_TABLET | Freq: Four times a day (QID) | ORAL | 0 refills | Status: AC | PRN

## 2018-10-26 MED ORDER — DULOXETINE HCL 30 MG PO CPEP: 60.0000 mg | ORAL_CAPSULE | Freq: Every day | ORAL | 1 refills | Status: DC

## 2018-10-26 MED ORDER — LIDOCAINE 5 % EX OINT: 1.0000 "application " | TOPICAL_OINTMENT | Freq: Four times a day (QID) | CUTANEOUS | 1 refills | Status: DC | PRN

## 2018-10-26 NOTE — Patient Instructions (Signed)
____________________________________________________________________________________________  Medication Rules  Purpose: To inform patients, and their family members, of our rules and regulations.  Applies to: All patients receiving prescriptions (written or electronic).  Pharmacy of record: Pharmacy where electronic prescriptions will be sent. If written prescriptions are taken to a different pharmacy, please inform the nursing staff. The pharmacy listed in the electronic medical record should be the one where you would like electronic prescriptions to be sent.  Electronic prescriptions: In compliance with the [place] Strengthen Opioid Misuse Prevention (STOP) Act of 2017 (Session Law 2017-74/H243), effective June 14, 2018, all controlled substances must be electronically prescribed. Calling prescriptions to the pharmacy will cease to exist.  Prescription refills: Only during scheduled appointments. Applies to all prescriptions.  NOTE: The following applies primarily to controlled substances (Opioid* Pain Medications).   Patient's responsibilities: 1. Pain Pills: Bring all pain pills to every appointment (except for procedure appointments). 2. Pill Bottles: Bring pills in original pharmacy bottle. Always bring the newest bottle. Bring bottle, even if empty. 3. Medication refills: You are responsible for knowing and keeping track of what medications you take and those you need refilled. The day before your appointment: write a list of all prescriptions that need to be refilled. The day of the appointment: give the list to the admitting nurse. Prescriptions will be written only during appointments. No prescriptions will be written on procedure days. If you forget a medication: it will not be "Called in", "Faxed", or "electronically sent". You will need to get another appointment to get these prescribed. No early refills. Do not call asking to have your prescription filled  early. 4. Prescription Accuracy: You are responsible for carefully inspecting your prescriptions before leaving our office. Have the discharge nurse carefully go over each prescription with you, before taking them home. Make sure that your name is accurately spelled, that your address is correct. Check the name and dose of your medication to make sure it is accurate. Check the number of pills, and the written instructions to make sure they are clear and accurate. Make sure that you are given enough medication to last until your next medication refill appointment. 5. Taking Medication: Take medication as prescribed. When it comes to controlled substances, taking less pills or less frequently than prescribed is permitted and encouraged. Never take more pills than instructed. Never take medication more frequently than prescribed.  6. Inform other Doctors: Always inform, all of your healthcare providers, of all the medications you take. 7. Pain Medication from other Providers: You are not allowed to accept any additional pain medication from any other Doctor or Healthcare provider. There are two exceptions to this rule. (see below) In the event that you require additional pain medication, you are responsible for notifying us, as stated below. 8. Medication Agreement: You are responsible for carefully reading and following our Medication Agreement. This must be signed before receiving any prescriptions from our practice. Safely store a copy of your signed Agreement. Violations to the Agreement will result in no further prescriptions. (Additional copies of our Medication Agreement are available upon request.) 9. Laws, Rules, & Regulations: All patients are expected to follow all Federal and State Laws, Statutes, Rules, & Regulations. Ignorance of the Laws does not constitute a valid excuse. The use of any illegal substances is prohibited. 10. Adopted CDC guidelines & recommendations: Target dosing levels will be  at or below 60 MME/day. Use of benzodiazepines** is not recommended.  Exceptions: There are only two exceptions to the rule of not   receiving pain medications from other Healthcare Providers. 1. Exception #1 (Emergencies): In the event of an emergency (i.e.: accident requiring emergency care), you are allowed to receive additional pain medication. However, you are responsible for: As soon as you are able, call our office [phone], at any time of the day or night, and leave a message stating your name, the date and nature of the emergency, and the name and dose of the medication prescribed. In the event that your call is answered by a member of our staff, make sure to document and save the date, time, and the name of the person that took your information.  2. Exception #2 (Planned Surgery): In the event that you are scheduled by another doctor or dentist to have any type of surgery or procedure, you are allowed (for a period no longer than 30 days), to receive additional pain medication, for the acute post-op pain. However, in this case, you are responsible for picking up a copy of our "Post-op Pain Management for Surgeons" handout, and giving it to your surgeon or dentist. This document is available at our office, and does not require an appointment to obtain it. Simply go to our office during business hours (Monday-Thursday from 8:00 AM to 4:00 PM) (Friday 8:00 AM to 12:00 Noon) or if you have a scheduled appointment with us, prior to your surgery, and ask for it by name. In addition, you will need to provide us with your name, name of your surgeon, type of surgery, and date of procedure or surgery.  *Opioid medications include: morphine, codeine, oxycodone, oxymorphone, hydrocodone, hydromorphone, meperidine, tramadol, tapentadol, buprenorphine, fentanyl, methadone. **Benzodiazepine medications include: diazepam (Valium), alprazolam (Xanax), clonazepam (Klonopine), lorazepam (Ativan), clorazepate  (Tranxene), chlordiazepoxide (Librium), estazolam (Prosom), oxazepam (Serax), temazepam (Restoril), triazolam (Halcion) (Last updated: 08/11/2017) ____________________________________________________________________________________________    

## 2018-10-26 NOTE — Progress Notes (Signed)
Pain Management Encounter Note - Virtual Visit via Telephone Telehealth (real-time audio visits between healthcare provider and patient).  Patient's Phone No. & Preferred Pharmacy:  [phone] (home); [phone] (mobile); (Preferred) [phone]  [id number] [place], [place] - [address] [place] [address] Phone: [phone] Fax: [phone]  [facility] - [place], [place] - [address] [place] [address] Phone: [phone] Fax: [phone]   Pre-screening note:  Our staff contacted [name] and offered him an "in person", "face-to-face" appointment versus a telephone encounter. He indicated preferring the telephone encounter, at this time.  Reason for Virtual Visit: COVID-19*  Social distancing based on CDC and AMA recommendations.   I contacted [name] on 10/26/2018 at 8:02 AM by telephone and clearly identified myself as Thad Ranger, NP. I verified that I was speaking with the correct person using two identifiers (Name and date of birth: [birth year]).  Advanced Informed Consent I sought verbal advanced consent from [name] for telemedicine interactions and virtual visit. I informed [name] of the security and privacy concerns, risks, and limitations associated with performing an evaluation and management service by telephone. I also informed [name] of the availability of "in person" appointments and I informed him of the possibility of a patient responsible charge related to this service. [name] expressed understanding and agreed to proceed.   Historic Elements   [name] is a 53 y.o. year old, male patient evaluated today after his last encounter by our practice on 08/28/2018. [name]  has a past medical history of Arthritis, Bilateral edema of lower extremity, Bulge of cervical disc without myelopathy, Chronic low back pain, Disorder of subcutaneous tissue,  Environmental allergies, History of cervical fracture, History of multiple concussions, History of pneumothorax, History of seizures (pt states takes gabapentin to control seizures and for neuropathy---  followed by PCP  dr  Leonides Schanz ([facility])  seizures are note mentioned as hx in her last note or previous notes), Hypertension, OBESITY, NOS (08/11/2006), OSA on CPAP, Peripheral neuropathy, Recurrent boils, Type 2 diabetes mellitus (HCC), and Wears glasses. He also  has a past surgical history that includes I & D RIGHT INDEX FINGER PURULENT FLEXOR TENOSYNOVITIS (08-23-2001); Lumbar disc surgery (02-08-2002); transthoracic echocardiogram (05-03-2007); REATTACHMENT LEFT INDEX FINGER (1999); Appendectomy (age 62); and Mass excision (N/A, 08/07/2015). [name] has a current medication list which includes the following prescription(s): aspirin, duloxetine, humalog, hydrocodone-acetaminophen, hydrocodone-acetaminophen, insulin glargine, ketoconazole, lidocaine, losartan, metformin, multivitamin with minerals, pravastatin, syringe-needle (disp) 3 ml, and triamcinolone cream. He  reports that he quit smoking about 6 years ago. His smoking use included cigarettes. He has a 48.00 pack-year smoking history. He quit smokeless tobacco use about 7 years ago.  His smokeless tobacco use included snuff. He reports current alcohol use. He reports that he does not use drugs. [name] is allergic to penicillins.   HPI  I last saw him on 08/28/2018. He is being evaluated for medication management. He has 4/10. He has left lower back and bilateral leg pain. He admits that the leg pain is from the knees down. He has numbness, tingling with a shooting pain down his buttocks into his leg. He has tingling in his feet with itching. He feels like the Cymbalta has helped with his tingling in his feet. He feels like the pain is about the same. He does feel like the weather affects his pain. He states that he has  started on BP  medication.   Pharmacotherapy Assessment  Analgesic:Hydrocodone 10 mg 4 times daily as needed, quantity 8830-month MME/day:40mg /day.  Monitoring: Pharmacotherapy: No side-effects or adverse reactions reported. [place] PMP: PDMP reviewed during this encounter.       Compliance: No problems identified. Plan: Refer to "POC".  Review of recent tests  MR LUMBAR SPINE WO CONTRAST CLINICAL DATA:  Low back pain  EXAM: MRI LUMBAR SPINE WITHOUT CONTRAST  TECHNIQUE: Multiplanar, multisequence MR imaging of the lumbar spine was performed. No intravenous contrast was administered.  COMPARISON:  Lumbar MRI 02/11/2015  FINDINGS: Segmentation:  Normal  Alignment:  Normal  Vertebrae: Hemangioma L1 vertebral body. Negative for fracture or mass. Discogenic changes at L4-5  Conus medullaris: Extends to the L1 level and appears normal.  Paraspinal and other soft tissues: Negative  Disc levels:  T12-L1: Broad-based central disc protrusion unchanged. No significant stenosis  L1-2:  Negative  L2-3:  Negative  L3-4: Disc degeneration with disc bulging and mild endplate spurring. No significant stenosis.  L4-5: Postop laminectomy on the left. Disc degeneration and diffuse endplate spurring. Bilateral mild facet hypertrophy. Mild spinal stenosis, unchanged. Mild subarticular and foraminal stenosis due to spurring unchanged  L5-S1:  Negative  IMPRESSION: Disc bulging L3-4 unchanged  Postop laminectomy left L4-5. Disc degeneration and spurring. Mild spinal stenosis unchanged. Mild subarticular and foraminal stenosis unchanged  Overall, no change from the prior MRI of 02/11/2015  Electronically Signed   By: Marlan Palauharles  Clark M.D.   On: 12/03/2016 09:16   Office Visit on 08/28/2018  Component Date Value Ref Range Status  . HbA1c, POC (controlled diabetic ra* 08/28/2018 9.4* 0.0 - 7.0 % Final  . POC Glucose 08/28/2018 251* 70 - 99 mg/dl Final   Assessment  The primary encounter  diagnosis was Chronic pain of both lower extremities (L>R). Diagnoses of Chronic bilateral low back pain with bilateral sciatica, Neurogenic pain, Chronic pain syndrome, and Lumbar spondylosis were also pertinent to this visit.  Plan of Care  I am having [name] maintain his aspirin, multivitamin with minerals, SYRINGE-NEEDLE (DISP) 3 ML, triamcinolone cream, ketoconazole, metFORMIN, losartan, pravastatin, insulin glargine, HumaLOG, HYDROcodone-acetaminophen, HYDROcodone-acetaminophen, DULoxetine, and lidocaine.  Pharmacotherapy (Medications Ordered): Meds ordered this encounter  Medications  . HYDROcodone-acetaminophen (NORCO) 10-325 MG tablet    Sig: Take 1 tablet by mouth every 6 (six) hours as needed for up to 30 days for severe pain.    Dispense:  120 tablet    Refill:  0    Do not place this medication, or any other prescription from our practice, on "Automatic Refill". Patient may have prescription filled one day early if pharmacy is closed on scheduled refill date.    Order Specific Question:   Supervising Provider    Answer:   Delano MetzNAVEIRA, FRANCISCO [phone][[id number]]  . HYDROcodone-acetaminophen (NORCO) 10-325 MG tablet    Sig: Take 1 tablet by mouth every 6 (six) hours as needed for up to 30 days for severe pain.    Dispense:  120 tablet    Refill:  0    Do not place this medication, or any other prescription from our practice, on "Automatic Refill". Patient may have prescription filled one day early if pharmacy is closed on scheduled refill date.    Order Specific Question:   Supervising Provider    Answer:   Delano MetzNAVEIRA, FRANCISCO [phone][[id number]]  . DULoxetine (CYMBALTA) 30 MG capsule    Sig: Take 2 capsules (60 mg total) by mouth daily.    Dispense:  60 capsule    Refill:  1    Order Specific Question:   Supervising Provider    Answer:   Delano Metz [phone]  . lidocaine (XYLOCAINE) 5 % ointment    Sig: Apply 1 application topically 4 (four) times daily as needed.    Dispense:  50  g    Refill:  1    Pt requesting jar    Order Specific Question:   Supervising Provider    Answer:   Delano Metz [phone]   Orders:  No orders of the defined types were placed in this encounter.  Follow-up plan:   Return in about 3 months (around 01/26/2019) for MedMgmt.   I discussed the assessment and treatment plan with the patient. The patient was provided an opportunity to ask questions and all were answered. The patient agreed with the plan and demonstrated an understanding of the instructions.  Patient advised to call back or seek an in-person evaluation if the symptoms or condition worsens.  Total duration of non-face-to-face encounter: 13 minutes.  Note by: Thad Ranger, NP Date: 10/26/2018; Time: 8:16 AM  Disclaimer:  * Given the special circumstances of the COVID-19 pandemic, the federal government has announced that the Office for Civil Rights (OCR) will exercise its enforcement discretion and will not impose penalties on physicians using telehealth in the event of noncompliance with regulatory requirements under the [employer] Portability and Accountability Act (HIPAA) in connection with the good faith provision of telehealth during the COVID-19 national public health emergency. (AMA)

## 2018-11-19 ENCOUNTER — Other Ambulatory Visit: Payer: Self-pay | Admitting: Family Medicine

## 2018-11-19 DIAGNOSIS — E1142 Type 2 diabetes mellitus with diabetic polyneuropathy: Secondary | ICD-10-CM

## 2018-11-20 ENCOUNTER — Other Ambulatory Visit: Payer: Self-pay | Admitting: Family Medicine

## 2018-11-20 DIAGNOSIS — E1142 Type 2 diabetes mellitus with diabetic polyneuropathy: Secondary | ICD-10-CM

## 2018-11-27 ENCOUNTER — Other Ambulatory Visit: Payer: Self-pay | Admitting: Family Medicine

## 2018-12-06 ENCOUNTER — Telehealth (INDEPENDENT_AMBULATORY_CARE_PROVIDER_SITE_OTHER): Payer: Medicare Other | Admitting: Family Medicine

## 2018-12-06 ENCOUNTER — Other Ambulatory Visit: Payer: Self-pay

## 2018-12-06 DIAGNOSIS — H6690 Otitis media, unspecified, unspecified ear: Secondary | ICD-10-CM | POA: Diagnosis not present

## 2018-12-06 MED ORDER — DOXYCYCLINE HYCLATE 100 MG PO TABS: 100.0000 mg | ORAL_TABLET | Freq: Two times a day (BID) | ORAL | 0 refills | Status: DC

## 2018-12-06 NOTE — Progress Notes (Signed)
[place] Telemedicine Visit  Patient consented to have virtual visit. Method of visit: Telephone  Encounter participants: Patient: [name] - located at home Provider: Guadalupe Dawn - located at [facility]  Chief Complaint: Left ear pain  HPI: 53 year old male who presents telephone visit for left ear pain.  States that he "got some water in it" about a week ago.  Says that since then he has had increasingly severe left ear pain.  He feels that the water is "still in there".  Feels that he has quite a bit of fluid in the left ear and feels like his hearing is perhaps a little muffled than usual.  States that he frequently gets inner ear infections and thinks that this was going on.  Tried a number of strategies to "get the water out of his ear".  These include putting alcohol, peroxide, a moist tablet, cotton ball, among other things and nothing seems to get the water out.  ROS: per HPI  Pertinent PMHx: Recurrent ear infections  Exam:  General: Pleasant, well sounding male, no acute distress Respiratory: Able to speak in clear, coherent sentences with no respiratory distress Psych: Appropriate, coherent thought process, very pleasant Left ear: Has pain to both palpation behind the ear and upon pulling pinna.  Assessment/Plan:  Acute otitis media Given patient's history, report exam findings he likely has otitis media.  Patient has a penicillin allergy which makes Augmentin contraindicated him.  Will treat with doxycycline 100 mg twice daily this is the preferred second line therapy.  Treat for 7 days.  Gave patient follow-up instructions.  If basically does not make his symptoms improve he will need to come in for a physical visit so thorough exam performed.    Time spent during visit with patient: 8 minutes

## 2018-12-06 NOTE — Assessment & Plan Note (Signed)
Given patient's history, report exam findings he likely has otitis media.  Patient has a penicillin allergy which makes Augmentin contraindicated him.  Will treat with doxycycline 100 mg twice daily this is the preferred second line therapy.  Treat for 7 days.  Gave patient follow-up instructions.  If basically does not make his symptoms improve he will need to come in for a physical visit so thorough exam performed.

## 2018-12-21 ENCOUNTER — Telehealth: Payer: Self-pay | Admitting: Student in an Organized Health Care Education/Training Program

## 2018-12-21 ENCOUNTER — Telehealth: Payer: Self-pay | Admitting: Pain Medicine

## 2018-12-21 NOTE — Telephone Encounter (Signed)
Pharmacy sent a request for refill. I checked in chart, patient spoke with [place] 5-14 and meds run out 7-13.  His appt is scheduled for 01-25-19. Do you want to send another refill to last until 01-25-19. Your next available is 12-26-18. Do you want to add him onto today

## 2018-12-21 NOTE — Telephone Encounter (Signed)
Error/ dr Holley Raring patient

## 2018-12-21 NOTE — Telephone Encounter (Signed)
Made appt for Monday VV 1pm. lvmail asking patient to call Monday morning to confirm/ will call patient if we do not hear from him.

## 2018-12-22 ENCOUNTER — Other Ambulatory Visit: Payer: Self-pay

## 2018-12-22 ENCOUNTER — Telehealth (INDEPENDENT_AMBULATORY_CARE_PROVIDER_SITE_OTHER): Payer: Medicare Other | Admitting: Family Medicine

## 2018-12-22 DIAGNOSIS — H9203 Otalgia, bilateral: Secondary | ICD-10-CM

## 2018-12-22 DIAGNOSIS — H6690 Otitis media, unspecified, unspecified ear: Secondary | ICD-10-CM

## 2018-12-22 MED ORDER — DOXYCYCLINE HYCLATE 100 MG PO TABS: 100.0000 mg | ORAL_TABLET | Freq: Two times a day (BID) | ORAL | 0 refills | Status: DC

## 2018-12-25 ENCOUNTER — Other Ambulatory Visit: Payer: Self-pay

## 2018-12-25 ENCOUNTER — Encounter: Payer: Self-pay | Admitting: Student in an Organized Health Care Education/Training Program

## 2018-12-25 ENCOUNTER — Telehealth: Payer: Self-pay | Admitting: *Deleted

## 2018-12-25 ENCOUNTER — Ambulatory Visit
Payer: Medicare Other | Attending: Student in an Organized Health Care Education/Training Program | Admitting: Student in an Organized Health Care Education/Training Program

## 2018-12-25 DIAGNOSIS — M899 Disorder of bone, unspecified: Secondary | ICD-10-CM

## 2018-12-25 DIAGNOSIS — Z79891 Long term (current) use of opiate analgesic: Secondary | ICD-10-CM

## 2018-12-25 DIAGNOSIS — M25512 Pain in left shoulder: Secondary | ICD-10-CM

## 2018-12-25 DIAGNOSIS — M5442 Lumbago with sciatica, left side: Secondary | ICD-10-CM

## 2018-12-25 DIAGNOSIS — G8929 Other chronic pain: Secondary | ICD-10-CM

## 2018-12-25 DIAGNOSIS — Z79899 Other long term (current) drug therapy: Secondary | ICD-10-CM

## 2018-12-25 DIAGNOSIS — M47816 Spondylosis without myelopathy or radiculopathy, lumbar region: Secondary | ICD-10-CM

## 2018-12-25 DIAGNOSIS — M5441 Lumbago with sciatica, right side: Secondary | ICD-10-CM

## 2018-12-25 DIAGNOSIS — M792 Neuralgia and neuritis, unspecified: Secondary | ICD-10-CM | POA: Diagnosis not present

## 2018-12-25 DIAGNOSIS — G894 Chronic pain syndrome: Secondary | ICD-10-CM

## 2018-12-25 DIAGNOSIS — M25511 Pain in right shoulder: Secondary | ICD-10-CM

## 2018-12-25 DIAGNOSIS — M79604 Pain in right leg: Secondary | ICD-10-CM | POA: Diagnosis not present

## 2018-12-25 DIAGNOSIS — M79605 Pain in left leg: Secondary | ICD-10-CM

## 2018-12-25 MED ORDER — HYDROCODONE-ACETAMINOPHEN 10-325 MG PO TABS: 1.0000 | ORAL_TABLET | Freq: Four times a day (QID) | ORAL | 0 refills | Status: AC | PRN

## 2018-12-25 MED ORDER — DULOXETINE HCL 30 MG PO CPEP: 60.0000 mg | ORAL_CAPSULE | Freq: Every day | ORAL | 2 refills | Status: DC

## 2018-12-25 MED ORDER — HYDROCODONE-ACETAMINOPHEN 10-325 MG PO TABS: 1.0000 | ORAL_TABLET | Freq: Four times a day (QID) | ORAL | 0 refills | Status: DC | PRN
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
## 2018-12-25 NOTE — Progress Notes (Deleted)
Pain Management Virtual Encounter Note - Virtual Visit via Telephone Telehealth (real-time audio visits between healthcare provider and patient).   Patient's Phone No. & Preferred Pharmacy:  [phone] (home); [phone] (mobile); (Preferred) [phone] [email or web address]  [id number] [place], [place] - [address] [place] [id number] Phone: [phone] Fax: [phone]  [facility] - [place] - [address] [place] [id number] Phone: [phone] Fax: [phone]    Pre-screening note:  Our staff contacted [name] and offered him an "in person", "face-to-face" appointment versus a telephone encounter. He indicated preferring the telephone encounter, at this time.   Reason for Virtual Visit: COVID-19*  Social distancing based on CDC and AMA recommendations.   I contacted Dot LanesBrian C Eichholz on 12/25/2018 via telephone.      I clearly identified myself as Edward JollyBilal Evely Gainey, MD. I verified that I was speaking with the correct person using two identifiers (Name: Dot LanesBrian C Schwandt, and date of birth: March 15, 1966).  Advanced Informed Consent I sought verbal advanced consent from Dot LanesBrian C Barefoot for virtual visit interactions. I informed [name] of possible security and privacy concerns, risks, and limitations associated with providing "not-in-person" medical evaluation and management services. I also informed [name] of the availability of "in-person" appointments. Finally, I informed him that there would be a charge for the virtual visit and that he could be  personally, fully or partially, financially responsible for it. [name] expressed understanding and agreed to proceed.   Historic Elements   Mr. Dot LanesBrian C Lege is a 53 y.o. year old, male patient evaluated today after his last encounter by our practice on 12/25/2018. [name]  has a past medical history of Arthritis, Bilateral edema of lower  extremity, Bulge of cervical disc without myelopathy, Chronic low back pain, Disorder of subcutaneous tissue, Environmental allergies, History of cervical fracture, History of multiple concussions, History of pneumothorax, History of seizures (pt states takes gabapentin to control seizures and for neuropathy---  followed by PCP  dr  Leonides Schanzdorsey ([facility])  seizures are note mentioned as hx in her last note or previous notes), Hypertension, OBESITY, NOS (08/11/2006), OSA on CPAP, Peripheral neuropathy, Recurrent boils, Type 2 diabetes mellitus (HCC), and Wears glasses. He also  has a past surgical history that includes I & D RIGHT INDEX FINGER PURULENT FLEXOR TENOSYNOVITIS (08-23-2001); Lumbar disc surgery (02-08-2002); transthoracic echocardiogram (05-03-2007); REATTACHMENT LEFT INDEX FINGER (1999); Appendectomy (age 53); and Mass excision (N/A, 08/07/2015). [name] has a current medication list which includes the following prescription(s): aspirin, doxycycline, duloxetine, humalog, hydrocodone-acetaminophen, insulin glargine, ketoconazole, lidocaine, losartan, metformin, multivitamin with minerals, pravastatin, syringe-needle (disp) 3 ml, and triamcinolone cream. He  reports that he quit smoking about 6 years ago. His smoking use included cigarettes. He has a 48.00 pack-year smoking history. He quit smokeless tobacco use about 7 years ago.  His smokeless tobacco use included snuff. He reports current alcohol use. He reports that he does not use drugs. [name] is allergic to penicillins.   HPI  Today, he is being contacted for medication management.  Pharmacotherapy Assessment  Analgesic: {There is no content from the last Subjective section.}   Monitoring: Pharmacotherapy: No side-effects or adverse reactions reported. [place] PMP: PDMP not reviewed this encounter.       Compliance: No problems identified. Effectiveness: Clinically acceptable. Plan: Refer to "POC".  Pertinent Labs   SAFETY  SCREENING Profile Lab Results  Component Value Date   [facility] NEGATIVE 01/31/2012  [facility] NEG 04/02/2008   Renal Function Lab Results  Component Value Date   BUN 22 07/06/2018   CREATININE 0.87 07/06/2018   BCR 25 (H) 07/06/2018   GFRAA 116 07/06/2018   GFRNONAA 101 07/06/2018   Hepatic Function Lab Results  Component Value Date   AST 29 07/06/2018   ALT 25 03/25/2015   ALBUMIN 4.4 07/06/2018   UDS Summary  Date Value Ref Range Status  07/06/2018 FINAL  Final    Comment:    ==================================================================== TOXASSURE SELECT 13 (MW) ==================================================================== Test                             Result       Flag       Units Drug Present and Declared for Prescription Verification   Hydrocodone                    2404         EXPECTED   ng/mg creat   Hydromorphone                  213          EXPECTED   ng/mg creat   Norhydrocodone                 1359         EXPECTED   ng/mg creat    Sources of hydrocodone include scheduled prescription    medications. Hydromorphone and norhydrocodone are expected    metabolites of hydrocodone. Hydromorphone is also available as a    scheduled prescription medication. ==================================================================== Test                      Result    Flag   Units      Ref Range   Creatinine              78               mg/dL      >=20 ==================================================================== Declared Medications:  The flagging and interpretation on this report are based on the  following declared medications.  Unexpected results may arise from  inaccuracies in the declared medications.  **Note: The testing scope of this panel includes these medications:  Hydrocodone (Norco)  **Note: The testing scope of this panel does not include following  reported medications:  Acetaminophen (Norco)  Aspirin (Aspirin 81)  Duloxetine  (Cymbalta)  Insulin (Humalog)  Insulin (Lantus)  Lidocaine (Xylocaine)  Multivitamin  Triamcinolone (Kenalog) ==================================================================== For clinical consultation, please call [phone]. ====================================================================    Note: Above Lab results reviewed.  Recent imaging  MR LUMBAR SPINE WO CONTRAST CLINICAL DATA:  Low back pain  EXAM: MRI LUMBAR SPINE WITHOUT CONTRAST  TECHNIQUE: Multiplanar, multisequence MR imaging of the lumbar spine was performed. No intravenous contrast was administered.  COMPARISON:  Lumbar MRI 02/11/2015  FINDINGS: Segmentation:  Normal  Alignment:  Normal  Vertebrae: Hemangioma L1 vertebral body. Negative for fracture or mass. Discogenic changes at L4-5  Conus medullaris: Extends to the L1 level and appears normal.  Paraspinal and other soft tissues: Negative  Disc levels:  T12-L1: Broad-based central disc protrusion unchanged. No significant stenosis  L1-2:  Negative  L2-3:  Negative  L3-4: Disc degeneration with disc bulging and mild endplate spurring. No significant stenosis.  L4-5: Postop laminectomy on the left. Disc degeneration and diffuse endplate spurring. Bilateral mild facet hypertrophy.  Mild spinal stenosis, unchanged. Mild subarticular and foraminal stenosis due to spurring unchanged  L5-S1:  Negative  IMPRESSION: Disc bulging L3-4 unchanged  Postop laminectomy left L4-5. Disc degeneration and spurring. Mild spinal stenosis unchanged. Mild subarticular and foraminal stenosis unchanged  Overall, no change from the prior MRI of 02/11/2015  Electronically Signed   By: Marlan Palauharles  Clark M.D.   On: 12/03/2016 09:16  Assessment  There were no encounter diagnoses.  Plan of Care  I am having Arlys JohnBrian C. Omar maintain his aspirin, multivitamin with minerals, SYRINGE-NEEDLE (DISP) 3 ML, triamcinolone cream, ketoconazole, losartan,  insulin glargine, HYDROcodone-acetaminophen, DULoxetine, lidocaine, pravastatin, metFORMIN, HumaLOG, and doxycycline.  Pharmacotherapy (Medications Ordered): No orders of the defined types were placed in this encounter.  Orders:  No orders of the defined types were placed in this encounter.  Follow-up plan:   No follow-ups on file.     {There is no content from the last Plan section.}   Recent Visits Date Type Provider Dept  10/26/18 Office Visit Barbette MerinoKing, Crystal M, NP [facility]  Showing recent visits within past 90 days and meeting all other requirements   Today's Visits Date Type Provider Dept  12/25/18 Appointment Edward JollyLateef, Charlean Carneal, MD [facility]  Showing today's visits and meeting all other requirements   Future Appointments No visits were found meeting these conditions.  Showing future appointments within next 90 days and meeting all other requirements   I discussed the assessment and treatment plan with the patient. The patient was provided an opportunity to ask questions and all were answered. The patient agreed with the plan and demonstrated an understanding of the instructions.  Patient advised to call back or seek an in-person evaluation if the symptoms or condition worsens.  Total duration of non-face-to-face encounter: *** minutes.  Note by: Edward JollyBilal Milia Warth, MD Date: 12/25/2018; Time: 10:18 AM  Note: This dictation was prepared with Dragon dictation. Any transcriptional errors that may result from this process are unintentional.  Disclaimer:  * Given the special circumstances of the COVID-19 pandemic, the federal government has announced that the Office for Civil Rights (OCR) will exercise its enforcement discretion and will not impose penalties on physicians using telehealth in the event of noncompliance with regulatory requirements under the [employer] Portability and Accountability Act (HIPAA) in connection with the good faith provision of  telehealth during the COVID-19 national public health emergency. (AMA)

## 2018-12-25 NOTE — Telephone Encounter (Signed)
Attempted to call for pre appointment assessment. Message left, requested for patient to return our call.

## 2018-12-25 NOTE — Assessment & Plan Note (Signed)
Acute otitis media left ear.  Will give doxycycline 100 mg twice daily for 7 days.  Advised patient to place earplugs in his ear if he felt there was even a possibility of getting ears wet.  Also counseled against putting alcohol and hydrogen peroxide in his ears to "dry them out".  Follow-up PRN

## 2018-12-25 NOTE — Progress Notes (Signed)
Pain Management Virtual Encounter Note - Virtual Visit via Telephone Telehealth (real-time audio visits between healthcare provider and patient).   Patient's Phone No. & Preferred Pharmacy:  [phone] (home); [phone] (mobile); (Preferred) [phone] [email or web address]  [id number]- [place], [place] - [address] [place] [id number] Phone: [phone] Fax: [phone]  [facility] [place], [place] - [address] [place] [id number] Phone: [phone] Fax: [phone]    Pre-screening note:  Our staff contacted [name] and offered him an "in person", "face-to-face" appointment versus a telephone encounter. He indicated preferring the telephone encounter, at this time.   Reason for Virtual Visit: COVID-19*  Social distancing based on CDC and AMA recommendations.   I contacted Drew LanesBrian C Schiavi on 12/25/2018 via telephone.      I clearly identified myself as Edward JollyBilal Sherriann Szuch, MD. I verified that I was speaking with the correct person using two identifiers (Name: [name], and date of birth: [birth year]).  Advanced Informed Consent I sought verbal advanced consent from [name] for virtual visit interactions. I informed [name] of possible security and privacy concerns, risks, and limitations associated with providing "not-in-person" medical evaluation and management services. I also informed [name] of the availability of "in-person" appointments. Finally, I informed him that there would be a charge for the virtual visit and that he could be  personally, fully or partially, financially responsible for it. [name] expressed understanding and agreed to proceed.   Historic Elements   [name] is a 53 y.o. year old, male patient evaluated today after his last encounter by our practice on 12/25/2018. [name]  has a past medical history of Arthritis, Bilateral edema of lower  extremity, Bulge of cervical disc without myelopathy, Chronic low back pain, Disorder of subcutaneous tissue, Environmental allergies, History of cervical fracture, History of multiple concussions, History of pneumothorax, History of seizures (pt states takes gabapentin to control seizures and for neuropathy---  followed by PCP  dr  Leonides Schanzdorsey ([facility])  seizures are note mentioned as hx in her last note or previous notes), Hypertension, OBESITY, NOS (08/11/2006), OSA on CPAP, Peripheral neuropathy, Recurrent boils, Type 2 diabetes mellitus (HCC), and Wears glasses. He also  has a past surgical history that includes I & D RIGHT INDEX FINGER PURULENT FLEXOR TENOSYNOVITIS (08-23-2001); Lumbar disc surgery (02-08-2002); transthoracic echocardiogram (05-03-2007); REATTACHMENT LEFT INDEX FINGER (1999); Appendectomy (age 53); and Mass excision (N/A, 08/07/2015). [name] has a current medication list which includes the following prescription(s): aspirin, doxycycline, duloxetine, humalog, hydrocodone-acetaminophen, hydrocodone-acetaminophen, insulin glargine, ketoconazole, lidocaine, losartan, metformin, multivitamin with minerals, pravastatin, syringe-needle (disp) 3 ml, and triamcinolone cream. He  reports that he quit smoking about 6 years ago. His smoking use included cigarettes. He has a 48.00 pack-year smoking history. He quit smokeless tobacco use about 7 years ago.  His smokeless tobacco use included snuff. He reports current alcohol use. He reports that he does not use drugs. [name] is allergic to penicillins.   HPI  Today, he is being contacted for medication management.   No change in medical history since last visit.  Patient's pain is at baseline.  Patient continues multimodal pain regimen as prescribed.  States that it provides pain relief and improvement in functional status.   Pharmacotherapy Assessment  Analgesic:   12/07/2018  1   10/26/2018  Hydrocodone-Acetamin 10-325 MG  120.00 30  Cr Kin   [id number]   Nor (2372)  0  40.00 MME  Medicare   [place]    Monitoring: Pharmacotherapy: No side-effects or adverse reactions reported. [place] PMP: PDMP reviewed during this encounter.       Compliance: No problems identified. Effectiveness: Clinically acceptable. Plan: Refer to "POC".  Pertinent Labs   SAFETY SCREENING Profile Lab Results  Component Value Date   MRSAPCR NEGATIVE 01/31/2012   HCVAB NEG 04/02/2008   Renal Function Lab Results  Component Value Date   BUN 22 07/06/2018   CREATININE 0.87 07/06/2018   BCR 25 (H) 07/06/2018   GFRAA 116 07/06/2018   GFRNONAA 101 07/06/2018   Hepatic Function Lab Results  Component Value Date   AST 29 07/06/2018   ALT 25 03/25/2015   ALBUMIN 4.4 07/06/2018   UDS Summary  Date Value Ref Range Status  07/06/2018 FINAL  Final    Comment:    ==================================================================== TOXASSURE SELECT 13 (MW) ==================================================================== Test                             Result       Flag       Units Drug Present and Declared for Prescription Verification   Hydrocodone                    2404         EXPECTED   ng/mg creat   Hydromorphone                  213          EXPECTED   ng/mg creat   Norhydrocodone                 1359         EXPECTED   ng/mg creat    Sources of hydrocodone include scheduled prescription    medications. Hydromorphone and norhydrocodone are expected    metabolites of hydrocodone. Hydromorphone is also available as a    scheduled prescription medication. ==================================================================== Test                      Result    Flag   Units      Ref Range   Creatinine              78               mg/dL      >=56>=20 ==================================================================== Declared Medications:  The flagging and interpretation on this report are based on the  following declared medications.   Unexpected results may arise from  inaccuracies in the declared medications.  **Note: The testing scope of this panel includes these medications:  Hydrocodone (Norco)  **Note: The testing scope of this panel does not include following  reported medications:  Acetaminophen (Norco)  Aspirin (Aspirin 81)  Duloxetine (Cymbalta)  Insulin (Humalog)  Insulin (Lantus)  Lidocaine (Xylocaine)  Multivitamin  Triamcinolone (Kenalog) ==================================================================== For clinical consultation, please call [phone]. ====================================================================    Note: Above Lab results reviewed.  Recent imaging  MR LUMBAR SPINE WO CONTRAST CLINICAL DATA:  Low back pain  EXAM: MRI LUMBAR SPINE WITHOUT CONTRAST  TECHNIQUE: Multiplanar, multisequence MR imaging of the lumbar spine was performed. No intravenous contrast was administered.  COMPARISON:  Lumbar MRI 02/11/2015  FINDINGS: Segmentation:  Normal  Alignment:  Normal  Vertebrae: Hemangioma L1 vertebral body. Negative for fracture or mass. Discogenic changes at L4-5  Conus medullaris: Extends to  the L1 level and appears normal.  Paraspinal and other soft tissues: Negative  Disc levels:  T12-L1: Broad-based central disc protrusion unchanged. No significant stenosis  L1-2:  Negative  L2-3:  Negative  L3-4: Disc degeneration with disc bulging and mild endplate spurring. No significant stenosis.  L4-5: Postop laminectomy on the left. Disc degeneration and diffuse endplate spurring. Bilateral mild facet hypertrophy. Mild spinal stenosis, unchanged. Mild subarticular and foraminal stenosis due to spurring unchanged  L5-S1:  Negative  IMPRESSION: Disc bulging L3-4 unchanged  Postop laminectomy left L4-5. Disc degeneration and spurring. Mild spinal stenosis unchanged. Mild subarticular and foraminal stenosis unchanged  Overall, no change from the  prior MRI of 02/11/2015  Electronically Signed   By: Franchot Gallo M.D.   On: 12/03/2016 09:16  Assessment  The primary encounter diagnosis was Chronic pain of both lower extremities (L>R). Diagnoses of Chronic bilateral low back pain with bilateral sciatica, Neurogenic pain, Chronic pain syndrome, Lumbar spondylosis, Long term current use of opiate analgesic, Pharmacologic therapy, Disorder of skeletal system, and Chronic pain of both shoulders were also pertinent to this visit.  Plan of Care  I have changed [name]'s HYDROcodone-acetaminophen. I am also having him start on HYDROcodone-acetaminophen. Additionally, I am having him maintain his aspirin, multivitamin with minerals, SYRINGE-NEEDLE (DISP) 3 ML, triamcinolone cream, ketoconazole, losartan, insulin glargine, lidocaine, pravastatin, metFORMIN, HumaLOG, doxycycline, and DULoxetine.  Pharmacotherapy (Medications Ordered): Meds ordered this encounter  Medications  . HYDROcodone-acetaminophen (NORCO) 10-325 MG tablet    Sig: Take 1 tablet by mouth every 6 (six) hours as needed for severe pain.    Dispense:  120 tablet    Refill:  0    Do not place this medication, or any other prescription from our practice, on "Automatic Refill". Patient may have prescription filled one day early if pharmacy is closed on scheduled refill date.  . DULoxetine (CYMBALTA) 30 MG capsule    Sig: Take 2 capsules (60 mg total) by mouth daily.    Dispense:  60 capsule    Refill:  2  . HYDROcodone-acetaminophen (NORCO) 10-325 MG tablet    Sig: Take 1 tablet by mouth every 6 (six) hours as needed for severe pain.    Dispense:  120 tablet    Refill:  0    Do not place this medication, or any other prescription from our practice, on "Automatic Refill". Patient may have prescription filled one day early if pharmacy is closed on scheduled refill date.   Orders:  No orders of the defined types were placed in this encounter.  Follow-up plan:    Return in about 9 weeks (around 02/26/2019) for Medication Management.     Recent Visits Date Type Provider Dept  10/26/18 Office Visit Vevelyn Francois, NP [facility]  Showing recent visits within past 90 days and meeting all other requirements   Today's Visits Date Type Provider Dept  12/25/18 Office Visit Gillis Santa, MD [facility]  Showing today's visits and meeting all other requirements   Future Appointments No visits were found meeting these conditions.  Showing future appointments within next 90 days and meeting all other requirements   I discussed the assessment and treatment plan with the patient. The patient was provided an opportunity to ask questions and all were answered. The patient agreed with the plan and demonstrated an understanding of the instructions.  Patient advised to call back or seek an in-person evaluation if the symptoms or condition worsens.  Total duration of  non-face-to-face encounter:25 minutes.  Note by: Javarian Jakubiak LEdward Jollyateef, MD Date: 12/25/2018; Time: 1:03 PM  Note: This dictation was prepared with Dragon dictation. Any transcriptional errors that may result from this process are unintentional.  Disclaimer:  * Given the special circumstances of the COVID-19 pandemic, the federal government has announced that the Office for Civil Rights (OCR) will exercise its enforcement discretion and will not impose penalties on physicians using telehealth in the event of noncompliance with regulatory requirements under the [employer] Insurance Portability and Accountability Act (HIPAA) in connection with the good faith provision of telehealth during the COVID-19 national public health emergency. (AMA)

## 2019-01-01 NOTE — Telephone Encounter (Signed)
Did you hear from him?

## 2019-01-25 ENCOUNTER — Encounter: Payer: Medicare Other | Admitting: Student in an Organized Health Care Education/Training Program

## 2019-01-30 ENCOUNTER — Other Ambulatory Visit: Payer: Self-pay | Admitting: *Deleted

## 2019-01-30 MED ORDER — INSULIN LISPRO 100 UNIT/ML ~~LOC~~ SOLN: SUBCUTANEOUS | 6 refills | Status: DC

## 2019-02-01 ENCOUNTER — Telehealth: Payer: Self-pay | Admitting: *Deleted

## 2019-02-01 NOTE — Addendum Note (Signed)
Addended by: Christen Bame D on: 02/01/2019 05:01 PM   Modules accepted: Orders

## 2019-02-01 NOTE — Telephone Encounter (Signed)
Pt lm on nurse line that [name] was trying to get refills on his DM testing supplies.   Called pt back to verify type of meter, LMOVM, will await callback. Christen Bame, CMA

## 2019-02-12 ENCOUNTER — Other Ambulatory Visit: Payer: Self-pay

## 2019-02-12 MED ORDER — "BD SYRINGE/NEEDLE 25G X 5/8"" 3 ML MISC": 12 refills | Status: DC

## 2019-02-12 NOTE — Telephone Encounter (Signed)
Patient called nurse line very upset that he does not have pen needles to go with his diabetic supplies. Patient stated he has been trying for over a month to get these. The only thing I see is from 8/20. These medication are still pending for PCP. However, patient told me he only needed the needles, he was able to get everything else.

## 2019-02-21 ENCOUNTER — Encounter: Payer: Self-pay | Admitting: Student in an Organized Health Care Education/Training Program

## 2019-02-22 ENCOUNTER — Encounter: Payer: Self-pay | Admitting: Student in an Organized Health Care Education/Training Program

## 2019-02-22 ENCOUNTER — Ambulatory Visit
Payer: Medicare Other | Attending: Student in an Organized Health Care Education/Training Program | Admitting: Student in an Organized Health Care Education/Training Program

## 2019-02-22 ENCOUNTER — Other Ambulatory Visit: Payer: Self-pay

## 2019-02-22 DIAGNOSIS — M5442 Lumbago with sciatica, left side: Secondary | ICD-10-CM | POA: Diagnosis not present

## 2019-02-22 DIAGNOSIS — Z9889 Other specified postprocedural states: Secondary | ICD-10-CM | POA: Insufficient documentation

## 2019-02-22 DIAGNOSIS — G8929 Other chronic pain: Secondary | ICD-10-CM

## 2019-02-22 DIAGNOSIS — Z79891 Long term (current) use of opiate analgesic: Secondary | ICD-10-CM

## 2019-02-22 DIAGNOSIS — M79604 Pain in right leg: Secondary | ICD-10-CM | POA: Diagnosis not present

## 2019-02-22 DIAGNOSIS — E1142 Type 2 diabetes mellitus with diabetic polyneuropathy: Secondary | ICD-10-CM

## 2019-02-22 DIAGNOSIS — M47816 Spondylosis without myelopathy or radiculopathy, lumbar region: Secondary | ICD-10-CM | POA: Diagnosis not present

## 2019-02-22 DIAGNOSIS — G894 Chronic pain syndrome: Secondary | ICD-10-CM

## 2019-02-22 DIAGNOSIS — M79605 Pain in left leg: Secondary | ICD-10-CM

## 2019-02-22 DIAGNOSIS — M5136 Other intervertebral disc degeneration, lumbar region: Secondary | ICD-10-CM

## 2019-02-22 DIAGNOSIS — M25511 Pain in right shoulder: Secondary | ICD-10-CM

## 2019-02-22 DIAGNOSIS — M5441 Lumbago with sciatica, right side: Secondary | ICD-10-CM

## 2019-02-22 DIAGNOSIS — M5416 Radiculopathy, lumbar region: Secondary | ICD-10-CM | POA: Insufficient documentation

## 2019-02-22 DIAGNOSIS — M25512 Pain in left shoulder: Secondary | ICD-10-CM

## 2019-02-22 DIAGNOSIS — M792 Neuralgia and neuritis, unspecified: Secondary | ICD-10-CM | POA: Diagnosis not present

## 2019-02-22 MED ORDER — HYDROCODONE-ACETAMINOPHEN 10-325 MG PO TABS: 1.0000 | ORAL_TABLET | Freq: Four times a day (QID) | ORAL | 0 refills | Status: AC | PRN

## 2019-02-22 MED ORDER — HYDROCODONE-ACETAMINOPHEN 10-325 MG PO TABS: 1.0000 | ORAL_TABLET | Freq: Four times a day (QID) | ORAL | 0 refills | Status: DC | PRN

## 2019-02-22 MED ORDER — DULOXETINE HCL 30 MG PO CPEP: 60.0000 mg | ORAL_CAPSULE | Freq: Every day | ORAL | 5 refills | Status: DC

## 2019-02-22 NOTE — Progress Notes (Signed)
Pain Management Virtual Encounter Note - Virtual Visit via [place] (real-time audio visits between healthcare provider and patient).   Patient's Phone No. & Preferred Pharmacy:  [phone] (home); [phone] (mobile); (Preferred) [phone] [email or web address]  [id number] [place], [place]  [address] Phone: [phone] Fax: [phone]  [place], [place] [address] Phone: [phone] Fax: [place], [place] [address] Phone: [phone] Fax: [phone]    Pre-screening note:  Our staff contacted [name] and offered him an "in person", "face-to-face" appointment versus a telephone encounter. He indicated preferring the telephone encounter, at this time.   Reason for Virtual Visit: COVID-19*  Social distancing based on CDC and AMA recommendations.   I contacted [name] on 02/22/2019 via video conference.      I clearly identified myself as Drew Santa, MD. I verified that I was speaking with the correct person using two identifiers (Name: [name], and date of birth: 03/29/1966).  Advanced Informed Consent I sought verbal advanced consent from [name] for virtual visit interactions. I informed Mr. Waren of possible security and privacy concerns, risks, and limitations associated with providing "not-in-person" medical evaluation and management services. I also informed Mr. Jares of the availability of "in-person" appointments. Finally, I informed him that there would be a charge for the virtual visit and that he could be  personally, fully or partially, financially responsible for it. Mr. Mansel expressed understanding and agreed to proceed.   Historic Elements   [name] is a 53 y.o. year old,  male patient evaluated today after his last encounter by our practice on 12/25/2018. [name]  has a past medical history of Arthritis, Bilateral edema of lower extremity, Bulge of cervical disc without myelopathy, Chronic low back pain, Disorder of subcutaneous tissue, Environmental allergies, History of cervical fracture, History of multiple concussions, History of pneumothorax, History of seizures (pt states takes gabapentin to control seizures and for neuropathy---  followed by PCP  dr  Lorenso Courier ([facility])  seizures are note mentioned as hx in her last note or previous notes), Hypertension, OBESITY, NOS (08/11/2006), OSA on CPAP, Peripheral neuropathy, Recurrent boils, Type 2 diabetes mellitus ([place]), and Wears glasses. He also  has a past surgical history that includes I & D RIGHT INDEX FINGER PURULENT FLEXOR TENOSYNOVITIS (08-23-2001); Lumbar disc surgery (02-08-2002); transthoracic echocardiogram (05-03-2007); REATTACHMENT LEFT INDEX FINGER (1999); Appendectomy (age 39); and Mass excision (N/A, 08/07/2015). [name] has a current medication list which includes the following prescription(s): aspirin, doxycycline, duloxetine, hydrocodone-acetaminophen, hydrocodone-acetaminophen, insulin glargine, insulin lispro, ketoconazole, losartan, multivitamin with minerals, pravastatin, b-d syringe/needle 3cc/25gx5/8, triamcinolone cream, and lidocaine. He  reports that he quit smoking about 7 years ago. His smoking use included cigarettes. He has a 48.00 pack-year smoking history. He quit smokeless tobacco use about 7 years ago.  His smokeless tobacco use included snuff. He reports current alcohol use. He reports that he does not use drugs. Mr. Kinker is allergic to penicillins.   HPI  Today, he is being contacted for medication management.  No change in medical history since last visit.  Patient's pain is at baseline.  Patient continues multimodal pain regimen as prescribed.  States that it provides pain  relief and improvement in functional status.   Pharmacotherapy Assessment  Analgesic 02/05/2019  1   12/25/2018  Hydrocodone-Acetamin 10-325 MG  120.00  30 Bi Lat   [id number]   Nor (2372)   0  40.00 MME  Medicare   [place]     Monitoring: Pharmacotherapy: No side-effects or adverse reactions reported. [place] PMP: PDMP reviewed during this encounter.       Compliance: No problems identified. Effectiveness: Clinically acceptable. Plan: Refer to "POC".  UDS:  Summary  Date Value Ref Range Status  07/06/2018 FINAL  Final    Comment:    ==================================================================== TOXASSURE SELECT 13 (MW) ==================================================================== Test                             Result       Flag       Units Drug Present and Declared for Prescription Verification   Hydrocodone                    2404         EXPECTED   ng/mg creat   Hydromorphone                  213          EXPECTED   ng/mg creat   Norhydrocodone                 1359         EXPECTED   ng/mg creat    Sources of hydrocodone include scheduled prescription    medications. Hydromorphone and norhydrocodone are expected    metabolites of hydrocodone. Hydromorphone is also available as a    scheduled prescription medication. ==================================================================== Test                      Result    Flag   Units      Ref Range   Creatinine              78               mg/dL      >=54>=20 ==================================================================== Declared Medications:  The flagging and interpretation on this report are based on the  following declared medications.  Unexpected results may arise from  inaccuracies in the declared medications.  **Note: The testing scope of this panel includes these medications:  Hydrocodone (Norco)  **Note: The testing scope of this panel does not include following  reported medications:  Acetaminophen  (Norco)  Aspirin (Aspirin 81)  Duloxetine (Cymbalta)  Insulin (Humalog)  Insulin (Lantus)  Lidocaine (Xylocaine)  Multivitamin  Triamcinolone (Kenalog) ==================================================================== For clinical consultation, please call [phone]. ====================================================================    Laboratory Chemistry Profile (12 mo)  Renal: 07/06/2018: BUN 22; BUN/Creatinine Ratio 25; Creatinine, Ser 0.87  Lab Results  Component Value Date   GFRAA 116 07/06/2018   GFRNONAA 101 07/06/2018   Hepatic: 07/06/2018: Albumin 4.4 Lab Results  Component Value Date   AST 29 07/06/2018   ALT 25 03/25/2015   Other: 07/06/2018: 25-Hydroxy, Vitamin D 40; 25-Hydroxy, Vitamin D-2 <1.0; 25-Hydroxy, Vitamin D-3 40; CRP 5; Sed Rate 65; Vitamin B-12 1,111 Note: Above Lab results reviewed.   Assessment  The primary encounter diagnosis was Neurogenic pain. Diagnoses of Chronic pain of both lower extremities (L>R), Chronic bilateral low back pain with bilateral sciatica, Lumbar spondylosis, Long term current use of opiate analgesic, Chronic pain of both shoulders, Chronic pain syndrome, History of lumbar laminectomy (left L4/5), Lumbar radiculopathy,  Lumbar degenerative disc disease, Chronic use of opiate drug for therapeutic purpose, and Diabetic polyneuropathy associated with type 2 diabetes mellitus (HCC) were also pertinent to this visit. General Recommendations: The pain condition that the patient suffers from is best treated with a multidisciplinary approach that involves an increase in physical activity to prevent de-conditioning and worsening of the pain cycle, as well as psychological counseling (formal and/or informal) to address the co-morbid psychological affects of pain. Treatment will often involve judicious use of pain medications and interventional procedures to decrease the pain, allowing the patient to participate in the physical activity that  will ultimately produce long-lasting pain reductions. The goal of the multidisciplinary approach is to return the patient to a higher level of overall function and to restore their ability to perform activities of daily living.    Plan of Care  I have discontinued Gered C. Funari's metFORMIN. I am also having him start on HYDROcodone-acetaminophen. Additionally, I am having him maintain his aspirin, multivitamin with minerals, triamcinolone cream, ketoconazole, losartan, insulin glargine, lidocaine, pravastatin, doxycycline, insulin lispro, B-D SYRINGE/NEEDLE 3CC/25GX5/8, DULoxetine, and HYDROcodone-acetaminophen.  Pharmacotherapy (Medications Ordered): Meds ordered this encounter  Medications  . DULoxetine (CYMBALTA) 30 MG capsule    Sig: Take 2 capsules (60 mg total) by mouth daily.    Dispense:  60 capsule    Refill:  5  . HYDROcodone-acetaminophen (NORCO) 10-325 MG tablet    Sig: Take 1 tablet by mouth every 6 (six) hours as needed for severe pain.    Dispense:  120 tablet    Refill:  0    Do not place this medication, or any other prescription from our practice, on "Automatic Refill". Patient may have prescription filled one day early if pharmacy is closed on scheduled refill date.  Marland Kitchen HYDROcodone-acetaminophen (NORCO) 10-325 MG tablet    Sig: Take 1 tablet by mouth every 6 (six) hours as needed for severe pain.    Dispense:  120 tablet    Refill:  0    Do not place this medication, or any other prescription from our practice, on "Automatic Refill". Patient may have prescription filled one day early if pharmacy is closed on scheduled refill date.    Follow-up plan:   Return in about 10 weeks (around 05/03/2019) for Medication Management, virtual.      Recent Visits Date Type Provider Dept  12/25/18 Office Visit Edward Jolly, MD [facility]  Showing recent visits within past 90 days and meeting all other requirements   Today's Visits Date Type Provider Dept   02/22/19 Office Visit Edward Jolly, MD [facility]  Showing today's visits and meeting all other requirements   Future Appointments No visits were found meeting these conditions.  Showing future appointments within next 90 days and meeting all other requirements   I discussed the assessment and treatment plan with the patient. The patient was provided an opportunity to ask questions and all were answered. The patient agreed with the plan and demonstrated an understanding of the instructions.  Patient advised to call back or seek an in-person evaluation if the symptoms or condition worsens.  Total duration of non-face-to-face encounter: .  Note by: Edward Jolly, MD Date: 02/22/2019; Time: 12:02 PM  Note: This dictation was prepared with Dragon dictation. Any transcriptional errors that may result from this process are unintentional.  Disclaimer:  * Given the special circumstances of the COVID-19 pandemic, the federal government has announced that the Office for Civil Rights (OCR) will exercise its  enforcement discretion and will not impose penalties on physicians using telehealth in the event of noncompliance with regulatory requirements under the [employer] and Accountability Act (HIPAA) in connection with the good faith provision of telehealth during the JIZXY-81 national public health emergency. ([place])

## 2019-03-27 ENCOUNTER — Ambulatory Visit
Admission: RE | Admit: 2019-03-27 | Discharge: 2019-03-27 | Disposition: A | Discharge status: Home / Self Care | Payer: Medicare Other | Source: Ambulatory Visit | Attending: Family Medicine | Admitting: Family Medicine

## 2019-03-27 ENCOUNTER — Other Ambulatory Visit: Payer: Self-pay

## 2019-03-27 ENCOUNTER — Ambulatory Visit (INDEPENDENT_AMBULATORY_CARE_PROVIDER_SITE_OTHER): Payer: Medicare Other | Admitting: Family Medicine

## 2019-03-27 VITALS — BP 154/100 | HR 111 | Ht 68.0 in | Wt 281.4 lb

## 2019-03-27 DIAGNOSIS — E11621 Type 2 diabetes mellitus with foot ulcer: Secondary | ICD-10-CM | POA: Diagnosis not present

## 2019-03-27 DIAGNOSIS — E1142 Type 2 diabetes mellitus with diabetic polyneuropathy: Secondary | ICD-10-CM | POA: Diagnosis not present

## 2019-03-27 DIAGNOSIS — S99922A Unspecified injury of left foot, initial encounter: Secondary | ICD-10-CM

## 2019-03-27 LAB — POCT GLYCOSYLATED HEMOGLOBIN (HGB A1C): HbA1c, POC (controlled diabetic range): 8.9 % — AB (ref 0.0–7.0)

## 2019-03-27 LAB — GLUCOSE, POCT (MANUAL RESULT ENTRY): POC Glucose: 380 mg/dl — AB (ref 70–99)

## 2019-03-27 MED ORDER — CLINDAMYCIN HCL 300 MG PO CAPS: 300.0000 mg | ORAL_CAPSULE | Freq: Three times a day (TID) | ORAL | 0 refills | Status: AC

## 2019-03-27 NOTE — Progress Notes (Signed)
Subjective:    Patient ID: [name], male    DOB: 1965/07/17, 53 y.o.   MRN: [id number]  CC: Injured great toe of left foot; elevated blood sugars  HPI: Injured left great toe: patient states he stubbed his left great toe on the side of a cabinet 1 week ago, since then it has become progressively more red and swollen. He has been keeping it clean with Iodine, soaked it once in epsom salts, and lanced it yesterday with a razorblade. Has is having 8/10 pain radiating from the toe to the midfoot. He denies fevers, nausea, vomiting, chills, body aches.   Diabetes: patient states he has been having elevated blood sugars. His blood sugar was 341 this morning now 380 at the clinic. He states he took lantus 32 units this AM but that "it isn't working". Patient states he had a snack last night at 10:30 or 11pm and took Humalog last night, but because he didn't eat this morning he didn't take any Humalog. He is no longer taking Metformin because of the nausea and diarrhea. He is asking for dietary guidance to improving his blood sugars and overall health. He denies any hypoglycemic episodes, vision changes. 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   Smoking status reviewed - previous smoker  Review of Systems - see HPI   Objective:  BP (!) 154/100   Pulse (!) 111   Ht 5\' 8"  (1.727 m)   Wt 281 lb 6 oz (127.6 kg)   SpO2 96%   BMI 42.78 kg/m  Vitals and nursing note reviewed  General: well nourished, in no acute distress Cardiac: RRR, clear S1 and S2, no murmurs appreciated Respiratory: CTA bilaterally, normal work of breathing Abdomen: soft, normal bowel sounds present Extremities: decreased sensation to bilateral feet, brisk capillary refill, purulent discharge appreciated to left great toe. See photos below - toenails are green from toenail polish       Assessment & Plan:   Type 2 diabetes mellitus with neurologic complication Patient is no longer  taking metformin due to side effects. Overall A1c improved from 9.4 down to 8.9%. Is using Lantus 32 units every morning but does not take his Humalog 16-20 units unless he eats. Patient is skipping meals. Blood sugar today is elevated in 300s.  -Patient instructed to follow up with PCP on Friday 10/16 for adjustment of diabetes regimen -Given information regarding plant-based and mediterranean diet for weight loss and optimizing his health. -Instructed not to skip meals - to take Humalog 2-3 times daily with eating -Referral made to podiatrist -BMP drawn, results pending  Injury of left great toe Patient with swelling, redness and purulent drainage from great toe of Left foot after stubbing it on the bottom of a cabinet 1 week prior. Concerning for toe fracture, cellulitis, and/or osteomyelitis.  - Patient was STRONGLY recommend to go to the Emergency Department for evaluation and treatment of the toe but declined. Patient was informed that not treating the toe could lead to spread of infection that could require amputation of toe, foot, or lower leg, spread of infection to blood leading to sepsis, and possibly even death. - Checking CBC with diff, BMP, CRP and Sed rate - We are prescribing Clindamycin 300mg  TID x 10 days - Patient instructed to purchase and wear hard-soled shoe to assist with toe healing - X-Ray of left foot. Patient referred to Podiatrist and Vascular surgeon.  - Patient instructed: "IF YOU GET WORSE AT  ALL (FEVERS, CHILLS, BODY ACHES, WORSENING SWELLING, REDNESS OF THE FOOT, ETC) GO TO THE EMERGENCY DEPARTMENT!"   Return in about 3 days (around 03/30/2019) for 11:10am.   Dr. Peggyann Shoals [facility], PGY-2

## 2019-03-27 NOTE — Assessment & Plan Note (Signed)
Patient is no longer taking metformin due to side effects. Overall A1c improved from 9.4 down to 8.9%. Is using Lantus 32 units every morning but does not take his Humalog 16-20 units unless he eats. Patient is skipping meals. Blood sugar today is elevated in 300s.  -Patient instructed to follow up with PCP on Friday 10/16 for adjustment of diabetes regimen -Given information regarding plant-based and mediterranean diet for weight loss and optimizing his health. -Instructed not to skip meals - to take Humalog 2-3 times daily with eating -Referral made to podiatrist -BMP drawn, results pending

## 2019-03-27 NOTE — Patient Instructions (Signed)
Thank you for coming in to see [place] today! Please see below to review our plan for today's visit:  1. We strongly recommend you go to the Emergency Department for evaluation of your toe (imaging, antibiotics). 2. We have drawn blood to check blood levels - we will call you with these results. 3. We are prescribing Clindamycin (an antibiotic) for you to take 3 times daily for 10 days. 4. We recommend you getting a HARD SOLED SHOE to wear to keep your toe protected (especially if the toe is potentially broken). 5. We are sending you for an X-Ray of your foot TODAY! We are also referring you to a Podiatrist (foot specialist) as well as a Vascular surgeon.   6. Call [facility]!  [address] [place], [place], [place] [address] Phone: [phone]  7. Go to: [facility] [address] [place], [place] [address] [phone]  IF YOU GET WORSE AT ALL (FEVERS, CHILLS, BODY ACHES, WORSENING SWELLING, REDNESS OF THE FOOT, ETC) GO TO THE EMERGENCY DEPARTMENT!  Please call the clinic at [phone] if your symptoms worsen or you have any concerns. It was our pleasure to serve you!   Dr. Peggyann Shoals [facility]   Mediterranean Diet A Mediterranean diet refers to food and lifestyle choices that are based on the traditions of countries located on the [place] Sea. This way of eating has been shown to help prevent certain conditions and improve outcomes for people who have chronic diseases, like kidney disease and heart disease. What are tips for following this plan? Lifestyle  Cook and eat meals together with your family, when possible.  Drink enough fluid to keep your urine clear or pale yellow.  Be physically active every day. This includes: ? Aerobic exercise like running or swimming. ? Leisure activities like gardening, walking, or housework.  Get 7-8 hours of sleep each night.  If recommended by your health care provider, drink red wine in  moderation. This means 1 glass a day for nonpregnant women and 2 glasses a day for men. A glass of wine equals 5 oz (150 mL). Reading food labels   Check the serving size of packaged foods. For foods such as rice and pasta, the serving size refers to the amount of cooked product, not dry.  Check the total fat in packaged foods. Avoid foods that have saturated fat or trans fats.  Check the ingredients list for added sugars, such as corn syrup. Shopping  At the grocery store, buy most of your food from the areas near the walls of the store. This includes: ? Fresh fruits and vegetables (produce). ? Grains, beans, nuts, and seeds. Some of these may be available in unpackaged forms or large amounts (in bulk). ? Fresh seafood. ? Poultry and eggs. ? Low-fat dairy products.  Buy whole ingredients instead of prepackaged foods.  Buy fresh fruits and vegetables in-season from local farmers markets.  Buy frozen fruits and vegetables in resealable bags.  If you do not have access to quality fresh seafood, buy precooked frozen shrimp or canned fish, such as tuna, salmon, or sardines.  Buy small amounts of raw or cooked vegetables, salads, or olives from the deli or salad bar at your store.  Stock your pantry so you always have certain foods on hand, such as olive oil, canned tuna, canned tomatoes, rice, pasta, and beans. Cooking  Cook foods with extra-virgin olive oil instead of using butter or other vegetable oils.  Have meat as a side dish, and have vegetables or grains as your main dish. This means having meat in small portions or adding small amounts of meat to foods like pasta or stew.  Use beans or vegetables instead of meat in common dishes like chili or lasagna.  Experiment with different cooking methods. Try roasting or broiling vegetables instead of steaming or sauteing them.  Add frozen vegetables to soups, stews, pasta, or rice.  Add nuts or seeds for added healthy fat at each  meal. You can add these to yogurt, salads, or vegetable dishes.  Marinate fish or vegetables using olive oil, lemon juice, garlic, and fresh herbs. Meal planning   Plan to eat 1 vegetarian meal one day each week. Try to work up to 2 vegetarian meals, if possible.  Eat seafood 2 or more times a week.  Have healthy snacks readily available, such as: ? Vegetable sticks with hummus. ? [place] yogurt. ? Fruit and nut trail mix.  Eat balanced meals throughout the week. This includes: ? Fruit: 2-3 servings a day ? Vegetables: 4-5 servings a day ? Low-fat dairy: 2 servings a day ? Fish, poultry, or lean meat: 1 serving a day ? Beans and legumes: 2 or more servings a week ? Nuts and seeds: 1-2 servings a day ? Whole grains: 6-8 servings a day ? Extra-virgin olive oil: 3-4 servings a day  Limit red meat and sweets to only a few servings a month What are my food choices?  Mediterranean diet ? Recommended  Grains: Whole-grain pasta. Brown rice. Bulgar wheat. Polenta. Couscous. Whole-wheat bread. Orpah Cobb.  Vegetables: Artichokes. Beets. Broccoli. Cabbage. Carrots. Eggplant. Green beans. Chard. Kale. Spinach. Onions. Leeks. Peas. Squash. Tomatoes. Peppers. Radishes.  Fruits: Apples. Apricots. Avocado. Berries. Bananas. Cherries. Dates. Figs. Grapes. Lemons. Melon. Oranges. Peaches. Plums. Pomegranate.  Meats and other protein foods: Beans. Almonds. Sunflower seeds. Pine nuts. Peanuts. Cod. Salmon. Scallops. Shrimp. Tuna. Tilapia. Clams. Oysters. Eggs.  Dairy: Low-fat milk. Cheese. Greek yogurt.  Beverages: Water. Red wine. Herbal tea.  Fats and oils: Extra virgin olive oil. Avocado oil. Grape seed oil.  Sweets and desserts: [place] yogurt with honey. Baked apples. Poached pears. Trail mix.  Seasoning and other foods: Basil. Cilantro. Coriander. Cumin. Mint. Parsley. Sage. Rosemary. Tarragon. Garlic. Oregano. Thyme. Pepper. Balsalmic vinegar. Tahini. Hummus. Tomato sauce. Olives.  Mushrooms. ? Limit these  Grains: Prepackaged pasta or rice dishes. Prepackaged cereal with added sugar.  Vegetables: Deep fried potatoes (french fries).  Fruits: Fruit canned in syrup.  Meats and other protein foods: Beef. Pork. Lamb. Poultry with skin. Hot dogs. Tomasa Blase.  Dairy: Ice cream. Sour cream. Whole milk.  Beverages: Juice. Sugar-sweetened soft drinks. Beer. Liquor and spirits.  Fats and oils: Butter. Canola oil. Vegetable oil. Beef fat (tallow). Lard.  Sweets and desserts: Cookies. Cakes. Pies. Candy.  Seasoning and other foods: Mayonnaise. Premade sauces and marinades. The items listed may not be a complete list. Talk with your dietitian about what dietary choices are right for you. Summary  The Mediterranean diet includes both food and lifestyle choices.  Eat a variety of fresh fruits and vegetables, beans, nuts, seeds, and whole grains.  Limit the amount of red meat and sweets that you eat.  Talk with your health care provider about whether it is safe for you to drink red wine in moderation. This means 1 glass a day for nonpregnant women and 2 glasses a day for men. A glass of wine equals 5 oz (150 mL). This  information is not intended to replace advice given to you by your health care provider. Make sure you discuss any questions you have with your health care provider. Document Released: 01/22/2016 Document Revised: 01/29/2016 Document Reviewed: 01/22/2016 Elsevier Patient Education  2020 [employer]  Vegetarian Eating Information Many people may prefer vegetarian eating for religious, environmental, or personal reasons. These diets are often lower in calories, salt, sugar, cholesterol, and saturated and trans fats. Vegetarian eating provides significant health benefits. People who eat a vegetarian diet often have lower rates of:  Obesity.  Diabetes.  Breast and colon cancers.  Cardiovascular and gallbladder diseases. What are the types of vegetarian  eating?  Vegetarian eating includes dietary choices that focus on eating mostly vegetables and fruit, grains, beans, nuts, and seeds. There are several different types of vegetarian eating. Talk with a diet and nutrition specialist (dietitian) about what type of vegetarian diet is best for you. Lacto-ovo vegetarian  Recommended foods: fruits and vegetables, milk and dairy, eggs, grains, soy and vegetable protein, beans, nuts, and seeds.  Foods to avoid: meat, poultry, seafood, animal-based broths and gravies, and gelatin. Lacto-vegetarian  Recommended foods: fruits and vegetables, milk and dairy, grains, soy and vegetable protein, beans, nuts, and seeds.  Foods to avoid: meat, poultry, seafood, animal-based broths and gravies, gelatin, and eggs. Vegan  Recommended foods: fruits and vegetables, grains, soy and vegetable protein, beans, nuts, and seeds.  Foods to avoid: meat, poultry, seafood, animal-based broths and gravies, gelatin, eggs, milk and dairy, and honey. What do I need to know about vegetarian eating? All vegetarian diets restrict proteins that come from animals. Foods that come from animals have important nutrients, such as protein, fats, vitamins, and minerals. It is important to get these nutrients from other types of foods. If you think you may not be getting the right nutrients, or if you do not eat any animal products, talk with your health care provider or dietitian about taking supplements. A dietitian can help determine your individual nutrient needs. What are tips for following this plan? Eat a diet that includes a variety of fruits, vegetables, whole grains, and protein sources. This is important to make sure you get enough of the following nutrients: Protein Healthy protein sources include:  Eggs, milk, and cheese. Soy products. Tofu, tempeh, and textured vegetable protein (TVP). Quinoa. Hemp seeds. Other protein sources include:  Beans, such as black beans or  kidney beans. Other legumes, such as lentils and split peas. Nuts, such as almonds, [place] nuts, and pecans. Seeds, such as sunflower seeds. To get the most benefit from plant-based proteins, combine two or more sources of plant protein with whole grains in one dish. Examples include beans and rice, almond butter on bread, or sunflower seeds on noodles. Vitamin B12 Sources of vitamin B12 include:  Cheese and eggs. Breakfast cereals and other prepared products that have vitamin B12 added (fortified products). If you eat a vegan diet, ask your health care provider or dietitian about taking a B12 supplement. Vitamin D Good sources of vitamin D include:  Egg yolks. Fortified dairy products. Fortified orange juice. Mushrooms. Cereals with added vitamin D. Another way of getting vitamin D is to spend 10 minutes each day in the sun. This helps your body make its own vitamin D. Depending on your age, you may need to take a vitamin D supplement. Talk with your health care provider or dietitian about how much vitamin D you need in a supplement. Iron Healthy sources of iron include:  Dark, leafy greens. Nuts. Beans. Grain products that are fortified with iron, such as cereals. Tofu, tempeh, soybeans, and quinoa. To get the most iron from plant-based foods:  Eat iron-containing plant-based foods with vitamin C. For example, squeeze fresh lemon juice over cooked greens like kale, chard, or spinach, or have a glass of orange juice with your meals.  Avoid eating dairy products, coffee, or tea with iron-containing foods. Omega-3 fatty acids Good sources of omega-3 fatty acids include:  Walnuts. Flax seeds, canola oil, soybean oil, and tofu. Avocados. Olives and olive oil. Foods with added omega-3 fatty acids, such as eggs, milk, and juices. Calcium Good sources of calcium include:  Dairy products. Fortified non-dairy milk. Fortified tofu. Dark, leafy greens, such as kale, bok choy, Chinese cabbage,  collard greens and mustard greens. Broccoli. Okra. Fortified breakfast cereals and fruit juices. Figs. Zinc Good sources of zinc include:  Pumpkin seeds. Legumes, such as chickpeas, kidney beans, and green peas. Wheat germ, whole grains, and fortified cereals. Mushrooms. Spinach and kale. Milk and dairy foods. Dark chocolate. Summary  Vegetarian eating is a choice made by people who prefer vegetarian eating for religious, environmental, or personal reasons. These diets can provide significant health benefits.  There are several types of vegetarian diets, but all restrict proteins that come from animals.  It is important to make sure that you are getting enough nutrients, including protein, vitamin B12, vitamin D, iron, omega-3 fatty acids, calcium, and zinc from your diet.  If you think you are not getting the right nutrients or if you do not eat any animal products, talk with your health care provider or dietitian. This information is not intended to replace advice given to you by your health care provider. Make sure you discuss any questions you have with your health care provider. Document Released: 06/03/2003 Document Revised: 08/03/2016 Document Reviewed: 08/03/2016 Elsevier Patient Education  2020 [employer].

## 2019-03-27 NOTE — Assessment & Plan Note (Signed)
Patient with swelling, redness and purulent drainage from great toe of Left foot after stubbing it on the bottom of a cabinet 1 week prior. Concerning for toe fracture, cellulitis, and/or osteomyelitis.  - Patient was STRONGLY recommend to go to the Emergency Department for evaluation and treatment of the toe but declined. Patient was informed that not treating the toe could lead to spread of infection that could require amputation of toe, foot, or lower leg, spread of infection to blood leading to sepsis, and possibly even death. - Checking CBC with diff, BMP, CRP and Sed rate - We are prescribing Clindamycin 300mg  TID x 10 days - Patient instructed to purchase and wear hard-soled shoe to assist with toe healing - X-Ray of left foot. Patient referred to Podiatrist and Vascular surgeon.  - Patient instructed: "IF YOU GET WORSE AT ALL (FEVERS, CHILLS, BODY ACHES, WORSENING SWELLING, REDNESS OF THE FOOT, ETC) GO TO THE EMERGENCY DEPARTMENT!"

## 2019-03-28 ENCOUNTER — Encounter: Payer: Self-pay | Admitting: Podiatry

## 2019-03-28 ENCOUNTER — Ambulatory Visit (INDEPENDENT_AMBULATORY_CARE_PROVIDER_SITE_OTHER): Payer: Medicare Other | Admitting: Podiatry

## 2019-03-28 VITALS — Resp 16

## 2019-03-28 DIAGNOSIS — L03032 Cellulitis of left toe: Secondary | ICD-10-CM

## 2019-03-28 LAB — CBC WITH DIFFERENTIAL/PLATELET
Basophils Absolute: 0.1 10*3/uL (ref 0.0–0.2)
Basos: 1 %
EOS (ABSOLUTE): 0.2 10*3/uL (ref 0.0–0.4)
Eos: 2 %
Hematocrit: 45.7 % (ref 37.5–51.0)
Hemoglobin: 16.3 g/dL (ref 13.0–17.7)
Immature Grans (Abs): 0 10*3/uL (ref 0.0–0.1)
Immature Granulocytes: 1 %
Lymphocytes Absolute: 2.5 10*3/uL (ref 0.7–3.1)
Lymphs: 29 %
MCH: 32.3 pg (ref 26.6–33.0)
MCHC: 35.7 g/dL (ref 31.5–35.7)
MCV: 91 fL (ref 79–97)
Monocytes Absolute: 0.5 10*3/uL (ref 0.1–0.9)
Monocytes: 6 %
Neutrophils Absolute: 5.4 10*3/uL (ref 1.4–7.0)
Neutrophils: 61 %
Platelets: 237 10*3/uL (ref 150–450)
RBC: 5.05 x10E6/uL (ref 4.14–5.80)
RDW: 13 % (ref 11.6–15.4)
WBC: 8.7 10*3/uL (ref 3.4–10.8)

## 2019-03-28 LAB — BASIC METABOLIC PANEL
BUN/Creatinine Ratio: 22 — ABNORMAL HIGH (ref 9–20)
BUN: 17 mg/dL (ref 6–24)
CO2: 22 mmol/L (ref 20–29)
Calcium: 9.3 mg/dL (ref 8.7–10.2)
Chloride: 92 mmol/L — ABNORMAL LOW (ref 96–106)
Creatinine, Ser: 0.78 mg/dL (ref 0.76–1.27)
GFR calc Af Amer: 122 mL/min/{1.73_m2} (ref >59)
GFR calc non Af Amer: 105 mL/min/{1.73_m2} (ref >59)
Glucose: 253 mg/dL — ABNORMAL HIGH (ref 65–99)
Potassium: 4.3 mmol/L (ref 3.5–5.2)
Sodium: 133 mmol/L — ABNORMAL LOW (ref 134–144)

## 2019-03-28 LAB — SEDIMENTATION RATE: Sed Rate: 51 mm/hr — ABNORMAL HIGH (ref 0–30)

## 2019-03-28 LAB — C-REACTIVE PROTEIN: CRP: 7 mg/L (ref 0–10)

## 2019-03-28 NOTE — Progress Notes (Signed)
   Subjective:    Patient ID: [name], male    DOB: Nov 07, 1965, 53 y.o.   MRN: [id number]  HPI    Review of Systems  All other systems reviewed and are negative.      Objective:   Physical Exam        Assessment & Plan:

## 2019-03-28 NOTE — Patient Instructions (Signed)

## 2019-03-28 NOTE — Progress Notes (Signed)
Subjective:   Patient ID: [name], male   DOB: 53 y.o.   MRN: [id number]   HPI Patient presents stating that the left great nail has become infected and he is on an antibiotic currently and is a relative uncontrolled diabetic with A1c of approximate 9 with sugar of 270 this morning and obesity.  Patient does not smoke and is not active currently   Review of Systems  All other systems reviewed and are negative.       Objective:  Physical Exam Vitals signs and nursing note reviewed.  Constitutional:      Appearance: He is well-developed.  Pulmonary:     Effort: Pulmonary effort is normal.  Musculoskeletal: Normal range of motion.  Skin:    General: Skin is warm.  Neurological:     Mental Status: He is alert.     Neurovascular status found to be mildly diminished with patient noted to have erythematous left hallux distal with loose nail bed and drainage medial lateral side localized with no obvious pus formation currently.  Patient is found to have reasonably good digital perfusion and muscle strength     Assessment:  Paronychia infection of the left hallux with damage nail plate with looseness of the nail plate also noted     Plan:  H&P condition reviewed and I have recommended removal of all necrotic tissue nail removal flushing of the wound sterile dressing and I infiltrated 60 mg like Marcaine mixture under sterile conditions remove the nail flush the wound remove necrotic tissue both medial lateral side and advised on soaks and continued antibiotic usage for the next 10 days.  Reappoint to recheck as needed and encouraged him if any issues were to occur or any systemic signs of infection to go straight to the emergency room

## 2019-03-29 ENCOUNTER — Encounter: Payer: Self-pay | Admitting: Family Medicine

## 2019-03-29 NOTE — Progress Notes (Signed)
Results were discussed with the patient over the phone today 10/15. He is coming in tomorrow 10/16 for a follow up visit with his PCP, would like to discuss diabetes control. Great toe of left foot also needs to be re-examined. Patient saw podiatrist ?yesterday who removed toe nail of left great toe. Patient feels he is improving on Clindamycin.  Milus Banister, [place], PGY-2 03/29/2019 4:45 PM

## 2019-03-30 ENCOUNTER — Ambulatory Visit (INDEPENDENT_AMBULATORY_CARE_PROVIDER_SITE_OTHER): Payer: Medicare Other | Admitting: Family Medicine

## 2019-03-30 ENCOUNTER — Telehealth: Payer: Self-pay | Admitting: *Deleted

## 2019-03-30 ENCOUNTER — Encounter: Payer: Self-pay | Admitting: Family Medicine

## 2019-03-30 ENCOUNTER — Other Ambulatory Visit: Payer: Self-pay

## 2019-03-30 DIAGNOSIS — L97529 Non-pressure chronic ulcer of other part of left foot with unspecified severity: Secondary | ICD-10-CM | POA: Diagnosis not present

## 2019-03-30 DIAGNOSIS — E1142 Type 2 diabetes mellitus with diabetic polyneuropathy: Secondary | ICD-10-CM

## 2019-03-30 MED ORDER — SILDENAFIL CITRATE 100 MG PO TABS: 50.0000 mg | ORAL_TABLET | Freq: Every day | ORAL | 11 refills | Status: DC | PRN

## 2019-03-30 MED ORDER — OZEMPIC (0.25 OR 0.5 MG/DOSE) 2 MG/1.5ML ~~LOC~~ SOPN: 0.5000 mg | PEN_INJECTOR | SUBCUTANEOUS | 0 refills | Status: DC

## 2019-03-30 MED ORDER — SILDENAFIL CITRATE 20 MG PO TABS: 20.0000 mg | ORAL_TABLET | Freq: Every day | ORAL | 0 refills | Status: DC | PRN

## 2019-03-30 MED ORDER — LIRAGLUTIDE 18 MG/3ML ~~LOC~~ SOPN: 0.6000 mg | PEN_INJECTOR | Freq: Every day | SUBCUTANEOUS | 0 refills | Status: DC

## 2019-03-30 NOTE — Telephone Encounter (Signed)
Opened in error. Jessica Fleeger, CMA  

## 2019-03-30 NOTE — Patient Instructions (Signed)
It was great seeing you again today! We discussed your diabetes. I want to reiterate that you have improved from this standpoint and congrats are in order. I will send you in a script for a medication called victoza and one for ozempic. Please see which one your insurance will cover. The dosage of each is on the script and the pharmacy can help you.  Your wound is looking good. I redressed this today and gave some supplies.  I renewed your handicap placard today. I gave you a goodrx coupon for viagra.

## 2019-04-02 ENCOUNTER — Encounter: Payer: Self-pay | Admitting: Family Medicine

## 2019-04-02 DIAGNOSIS — L97509 Non-pressure chronic ulcer of other part of unspecified foot with unspecified severity: Secondary | ICD-10-CM | POA: Insufficient documentation

## 2019-04-02 NOTE — Assessment & Plan Note (Signed)
Toe ulcer 2/2 trauma with consequent infection 2/2 diabetes. S/P removal of toenail. Patient to complete 1 week course of clindamycin. Wound looks good today. Redressed wound with xerofrom, covered by kerlix, with micropore tape. Patient can follow up as needed for wound management.

## 2019-04-02 NOTE — Progress Notes (Signed)
HPI 53 year old male who presents for foot ulcer follow up and type II diabetes management.  Patient had initially stubbed his toe around 10/6. He was seen by podiatry on 10/14 and had his toenail removed and all dead tissue debrided. At the time there was significant discoloration, pain, and purulent material associated with the toenail. He was placed on clindamycin which has continued to take.  He states that he is doing very well from a toe standpoint. It has improved significantly and the pain has mostly resolved.  From a diabetes standpoint he states that his sugars have continue to be persistently elevated to the low 200s range despite "starving himself" and 'only eating vegetables".  His a1c was 8.9 from 9.4 at last check 7 months prior.  CC: wound check, diabetes management   ROS:  Review of Systems See HPI for ROS.   CC, SH/smoking status, and VS noted  Objective: BP (!) 154/99    Pulse (!) 104    Ht 5\' 8"  (1.727 m)    Wt 287 lb 2 oz (130.2 kg)    SpO2 97%    BMI 43.66 kg/m  Gen: very pleasant 53 year old caucasian male, no acute distress CV: rrr, no m/r/g Resp: lungs ctab, no accessory muscle use Neuro: Alert and oriented, Speech clear, No gross deficits Left great toe: toenail s/p removal, no purulent discharge, minimal swelling, no blood noted. Mild skin breakdown noted proximal to nail bed. Green toenail paint noted on other toes.      Assessment and plan:  Type 2 diabetes mellitus with neurologic complication U4Q improved to 8.9 from 9.4 over last 7 months. Takes lantus 50 units daily, humalog 20u with meals. Could not tolerate metformin. Will add on sglt-2 inhibitor. I am not sure if the ozempic or victoza will be covered by patient's insurance. Gave script for both and advised to get ozempic if affordable given it is once weekly, or victoza if not affordable. He is to contact me if neither is affordable. Would like for him to check sugars before meals and at  night and asked him to contact me in 2 weeks or so to check and for further adjustment. It is unclear why he is not sliding his humalog, but could like set up sliding scale as opposed to fixed 20U. - adding on ozempic or victoza, patient is to let me know which is affordable - monitor sugars ac hs for 2 weeks - follow up with me via phone in 2 weeks  Toe ulcer ([place]) Toe ulcer 2/2 trauma with consequent infection 2/2 diabetes. S/P removal of toenail. Patient to complete 1 week course of clindamycin. Wound looks good today. Redressed wound with xerofrom, covered by kerlix, with micropore tape. Patient can follow up as needed for wound management.   No orders of the defined types were placed in this encounter.   Meds ordered this encounter  Medications   liraglutide (VICTOZA) 18 MG/3ML SOPN    Sig: Inject 0.1 mLs (0.6 mg total) into the skin daily. 0.6 mg once daily for 1 week,then increase to 1.2 mg once daily,max 1.8  mg    Dispense:  2 pen    Refill:  0   Semaglutide,0.25 or 0.5MG /DOS, (OZEMPIC, 0.25 OR 0.5 MG/DOSE,) 2 MG/1.5ML SOPN    Sig: Inject 0.5 mg into the skin once a week.    Dispense:  2 pen    Refill:  0   DISCONTD: sildenafil (VIAGRA) 100 MG tablet  Sig: Take 0.5 tablets (50 mg total) by mouth daily as needed for erectile dysfunction.    Dispense:  5 tablet    Refill:  11   sildenafil (REVATIO) 20 MG tablet    Sig: Take 1 tablet (20 mg total) by mouth daily as needed.    Dispense:  30 tablet    Refill:  0     Myrene Buddy MD PGY-3 Family Medicine Resident  04/02/2019 3:58 PM

## 2019-04-02 NOTE — Assessment & Plan Note (Signed)
A1c improved to 8.9 from 9.4 over last 7 months. Takes lantus 50 units daily, humalog 20u with meals. Could not tolerate metformin. Will add on sglt-2 inhibitor. I am not sure if the ozempic or victoza will be covered by patient's insurance. Gave script for both and advised to get ozempic if affordable given it is once weekly, or victoza if not affordable. He is to contact me if neither is affordable. Would like for him to check sugars before meals and at night and asked him to contact me in 2 weeks or so to check and for further adjustment. It is unclear why he is not sliding his humalog, but could like set up sliding scale as opposed to fixed 20U. - adding on ozempic or victoza, patient is to let me know which is affordable - monitor sugars ac hs for 2 weeks - follow up with me via phone in 2 weeks

## 2019-04-19 ENCOUNTER — Other Ambulatory Visit: Payer: Self-pay

## 2019-04-22 MED ORDER — LIRAGLUTIDE 18 MG/3ML ~~LOC~~ SOPN: 0.6000 mg | PEN_INJECTOR | Freq: Every day | SUBCUTANEOUS | 0 refills | Status: DC

## 2019-04-27 ENCOUNTER — Other Ambulatory Visit: Payer: Self-pay

## 2019-04-29 ENCOUNTER — Other Ambulatory Visit: Payer: Self-pay | Admitting: Family Medicine

## 2019-04-30 MED ORDER — LIRAGLUTIDE 18 MG/3ML ~~LOC~~ SOPN: 0.6000 mg | PEN_INJECTOR | Freq: Every day | SUBCUTANEOUS | 0 refills | Status: DC

## 2019-05-02 ENCOUNTER — Other Ambulatory Visit: Payer: Self-pay

## 2019-05-02 ENCOUNTER — Telehealth: Payer: Self-pay | Admitting: *Deleted

## 2019-05-02 MED ORDER — OZEMPIC (0.25 OR 0.5 MG/DOSE) 2 MG/1.5ML ~~LOC~~ SOPN: 0.5000 mg | PEN_INJECTOR | SUBCUTANEOUS | 0 refills | Status: DC

## 2019-05-02 NOTE — Telephone Encounter (Signed)
Attempted to call for pre appointment review of meds/allergies. Message left on home phone. Mobile number listed does not belong to this patient.

## 2019-05-03 ENCOUNTER — Encounter: Payer: Self-pay | Admitting: Student in an Organized Health Care Education/Training Program

## 2019-05-03 ENCOUNTER — Other Ambulatory Visit: Payer: Self-pay

## 2019-05-03 ENCOUNTER — Ambulatory Visit
Payer: Medicare Other | Attending: Student in an Organized Health Care Education/Training Program | Admitting: Student in an Organized Health Care Education/Training Program

## 2019-05-03 DIAGNOSIS — M5442 Lumbago with sciatica, left side: Secondary | ICD-10-CM

## 2019-05-03 DIAGNOSIS — M792 Neuralgia and neuritis, unspecified: Secondary | ICD-10-CM | POA: Diagnosis not present

## 2019-05-03 DIAGNOSIS — M25512 Pain in left shoulder: Secondary | ICD-10-CM

## 2019-05-03 DIAGNOSIS — M25511 Pain in right shoulder: Secondary | ICD-10-CM

## 2019-05-03 DIAGNOSIS — M79604 Pain in right leg: Secondary | ICD-10-CM | POA: Diagnosis not present

## 2019-05-03 DIAGNOSIS — M47816 Spondylosis without myelopathy or radiculopathy, lumbar region: Secondary | ICD-10-CM

## 2019-05-03 DIAGNOSIS — Z79891 Long term (current) use of opiate analgesic: Secondary | ICD-10-CM | POA: Diagnosis not present

## 2019-05-03 DIAGNOSIS — M79605 Pain in left leg: Secondary | ICD-10-CM

## 2019-05-03 DIAGNOSIS — M5441 Lumbago with sciatica, right side: Secondary | ICD-10-CM

## 2019-05-03 DIAGNOSIS — G894 Chronic pain syndrome: Secondary | ICD-10-CM

## 2019-05-03 DIAGNOSIS — G8929 Other chronic pain: Secondary | ICD-10-CM

## 2019-05-03 MED ORDER — HYDROCODONE-ACETAMINOPHEN 10-325 MG PO TABS: 1.0000 | ORAL_TABLET | Freq: Four times a day (QID) | ORAL | 0 refills | Status: DC | PRN

## 2019-05-03 MED ORDER — HYDROCODONE-ACETAMINOPHEN 10-325 MG PO TABS: 1.0000 | ORAL_TABLET | Freq: Four times a day (QID) | ORAL | 0 refills | Status: AC | PRN

## 2019-05-03 NOTE — Progress Notes (Signed)
Pain Management Virtual Encounter Note - Virtual Visit via [place] (real-time audio visits between healthcare provider and patient).   Patient's Phone No. & Preferred Pharmacy:  [phone] (home); [phone] (mobile); (Preferred) [phone] [email or web address]  [id number] [address], [place] [address] [place] [address] Phone: [phone] Fax: [phone]  [place], [place] [address] Phone: [phone] Fax: [place], [address] [place] [address] Phone: [phone] Fax: [phone]    Pre-screening note:  Our staff contacted [name] and offered him an "in person", "face-to-face" appointment versus a telephone encounter. He indicated preferring the telephone encounter, at this time.   Reason for Virtual Visit: COVID-19*  Social distancing based on CDC and AMA recommendations.   I contacted [name] on 05/03/2019 via video conference.      I clearly identified myself as Gillis Santa, MD. I verified that I was speaking with the correct person using two identifiers (Name: [name], and date of birth: 19-Mar-1966).  Advanced Informed Consent I sought verbal advanced consent from [name] for virtual visit interactions. I informed [name] of possible security and privacy concerns, risks, and limitations associated with providing "not-in-person" medical evaluation and management services. I also informed [name] of the availability of "in-person" appointments. Finally, I informed him that there would be a charge for the virtual visit and that he could be  personally, fully or partially, financially responsible for it. [name] expressed understanding and agreed to proceed.   Historic Elements   [name] is a 53 y.o. year old,  male patient evaluated today after his last encounter by our practice on 05/02/2019. [name]  has a past medical history of Arthritis, Bilateral edema of lower extremity, Bulge of cervical disc without myelopathy, Chronic low back pain, Disorder of subcutaneous tissue, Environmental allergies, History of cervical fracture, History of multiple concussions, History of pneumothorax, History of seizures (pt states takes gabapentin to control seizures and for neuropathy---  followed by PCP  dr  Lorenso Courier ([facility])  seizures are note mentioned as hx in her last note or previous notes), Hypertension, OBESITY, NOS (08/11/2006), OSA on CPAP, Peripheral neuropathy, Recurrent boils, Type 2 diabetes mellitus ([place]), and Wears glasses. He also  has a past surgical history that includes I & D RIGHT INDEX FINGER PURULENT FLEXOR TENOSYNOVITIS (08-23-2001); Lumbar disc surgery (02-08-2002); transthoracic echocardiogram (05-03-2007); REATTACHMENT LEFT INDEX FINGER (1999); Appendectomy (age 53); and Mass excision (N/A, 08/07/2015). [name] has a current medication list which includes the following prescription(s): aspirin, duloxetine, hydrocodone-acetaminophen, hydrocodone-acetaminophen, insulin glargine, insulin lispro, ketoconazole, lidocaine, liraglutide, losartan, multivitamin with minerals, pravastatin, ozempic (0.25 or 0.5 mg/dose), sildenafil, b-d syringe/needle 3cc/25gx5/8, and triamcinolone cream. He  reports that he quit smoking about 7 years ago. His smoking use included cigarettes. He has a 48.00 pack-year smoking history. He quit smokeless tobacco use about 7 years ago.  His smokeless tobacco use included snuff. He reports current alcohol use. He reports that he does not use drugs. [name] is allergic to penicillins.   HPI  Today, he is being contacted for medication management.   No significant change in medical history since last visit. Started Victoza for diabetic management with PCP.  Patient's  pain is at baseline.  Patient continues multimodal pain regimen as prescribed.  States  that it provides pain relief and improvement in functional status.  Pharmacotherapy Assessment  Analgesic  04/07/2019  1   02/22/2019  Hydrocodone-Acetamin 10-325 MG  120.00  30 Bi Lat   [id number]   Nor (2372)   0  40.00 MME  Medicare   [place]  03/08/2019  1   02/22/2019  Hydrocodone-Acetamin 10-325 MG  120.00  30 Bi Lat   [id number]   Nor (2372)   0  40.00 MME  Medicare   [place]     Monitoring: Pharmacotherapy: No side-effects or adverse reactions reported. [place] PMP: PDMP reviewed during this encounter.       Compliance: No problems identified. Effectiveness: Clinically acceptable. Plan: Refer to "POC".  UDS:  Summary  Date Value Ref Range Status  07/06/2018 FINAL  Final    Comment:    ==================================================================== TOXASSURE SELECT 13 (MW) ==================================================================== Test                             Result       Flag       Units Drug Present and Declared for Prescription Verification   Hydrocodone                    2404         EXPECTED   ng/mg creat   Hydromorphone                  213          EXPECTED   ng/mg creat   Norhydrocodone                 1359         EXPECTED   ng/mg creat    Sources of hydrocodone include scheduled prescription    medications. Hydromorphone and norhydrocodone are expected    metabolites of hydrocodone. Hydromorphone is also available as a    scheduled prescription medication. ==================================================================== Test                      Result    Flag   Units      Ref Range   Creatinine              78               mg/dL      >=02>=20 ==================================================================== Declared Medications:  The flagging and interpretation on this report are based on the  following declared medications.  Unexpected results may arise from   inaccuracies in the declared medications.  **Note: The testing scope of this panel includes these medications:  Hydrocodone (Norco)  **Note: The testing scope of this panel does not include following  reported medications:  Acetaminophen (Norco)  Aspirin (Aspirin 81)  Duloxetine (Cymbalta)  Insulin (Humalog)  Insulin (Lantus)  Lidocaine (Xylocaine)  Multivitamin  Triamcinolone (Kenalog) ==================================================================== For clinical consultation, please call [phone]. ====================================================================    Laboratory Chemistry Profile (12 mo)  Renal: 03/27/2019: BUN 17; BUN/Creatinine Ratio 22; Creatinine, Ser 0.78  Lab Results  Component Value Date   GFRAA 122 03/27/2019   GFRNONAA 105 03/27/2019   Hepatic: 07/06/2018: Albumin 4.4 Lab Results  Component Value Date   AST 29 07/06/2018   ALT 25 03/25/2015   Other: 07/06/2018: 25-Hydroxy, Vitamin D 40; 25-Hydroxy, Vitamin D-2 <1.0; 25-Hydroxy, Vitamin D-3 40; Vitamin B-12 1,111 03/27/2019: CRP 7; Sed Rate 51 Note: Above Lab results reviewed.  Imaging  Last 90 days:  Dg Foot Complete Left  Result Date: 03/27/2019 CLINICAL DATA:  Diabetic foot ulcer. EXAM: LEFT FOOT - COMPLETE 3+ VIEW COMPARISON:  No recent. FINDINGS: Soft tissue swelling left great toe. No radiopaque foreign body. No underlying acute bony abnormality identified. No erosive abnormality identified. If osteomyelitis remains a clinical concern MRI can be obtained. IMPRESSION: Soft tissue swelling left great toe. No underlying bony abnormality. Electronically Signed   By: Maisie Fus  Register   On: 03/27/2019 12:35    Assessment  The primary encounter diagnosis was Chronic pain of both lower extremities (L>R). Diagnoses of Chronic bilateral low back pain with bilateral sciatica, Neurogenic pain, Lumbar spondylosis, Long term current use of opiate analgesic, Chronic pain of both shoulders, and  Chronic pain syndrome were also pertinent to this visit.  Plan of Care  I am having [name] start on HYDROcodone-acetaminophen. I am also having him maintain his aspirin, multivitamin with minerals, triamcinolone cream, ketoconazole, losartan, insulin glargine, lidocaine, pravastatin, insulin lispro, B-D SYRINGE/NEEDLE 3CC/25GX5/8, DULoxetine, liraglutide, sildenafil, Ozempic (0.25 or 0.5 MG/DOSE), and HYDROcodone-acetaminophen.  Pharmacotherapy (Medications Ordered): Meds ordered this encounter  Medications  . HYDROcodone-acetaminophen (NORCO) 10-325 MG tablet    Sig: Take 1 tablet by mouth every 6 (six) hours as needed for severe pain.    Dispense:  120 tablet    Refill:  0    Do not place this medication, or any other prescription from our practice, on "Automatic Refill". Patient may have prescription filled one day early if pharmacy is closed on scheduled refill date.  Marland Kitchen HYDROcodone-acetaminophen (NORCO) 10-325 MG tablet    Sig: Take 1 tablet by mouth every 6 (six) hours as needed for severe pain.    Dispense:  120 tablet    Refill:  0    Do not place this medication, or any other prescription from our practice, on "Automatic Refill". Patient may have prescription filled one day early if pharmacy is closed on scheduled refill date.   Follow-up plan:   Return in about 8 weeks (around 06/28/2019) for Medication Management, in person, (UDS).    Recent Visits Date Type Provider Dept  02/22/19 Office Visit Edward Jolly, MD [facility]  Showing recent visits within past 90 days and meeting all other requirements   Today's Visits Date Type Provider Dept  05/03/19 Office Visit Edward Jolly, MD [facility]  Showing today's visits and meeting all other requirements   Future Appointments No visits were found meeting these conditions.  Showing future appointments within next 90 days and meeting all other requirements   I discussed the assessment and treatment  plan with the patient. The patient was provided an opportunity to ask questions and all were answered. The patient agreed with the plan and demonstrated an understanding of the instructions.  Patient advised to call back or seek an in-person evaluation if the symptoms or condition worsens.  Total duration of non-face-to-face encounter: 25 minutes.  Note by: Edward Jolly, MD Date: 05/03/2019; Time: 1:40 PM  Note: This dictation was prepared with Dragon dictation. Any transcriptional errors that may result from this process are unintentional.  Disclaimer:  * Given the special circumstances of the COVID-19 pandemic, the federal government has announced that the Office for Civil Rights (OCR) will exercise its enforcement discretion and will not impose penalties on physicians using telehealth in the event of noncompliance with regulatory requirements under the [employer] Portability and Accountability Act (HIPAA) in connection with the good faith provision of  telehealth during the COVID-19 national public health emergency. (AMA)

## 2019-05-14 ENCOUNTER — Other Ambulatory Visit: Payer: Self-pay | Admitting: *Deleted

## 2019-05-15 MED ORDER — OZEMPIC (0.25 OR 0.5 MG/DOSE) 2 MG/1.5ML ~~LOC~~ SOPN: 0.5000 mg | PEN_INJECTOR | SUBCUTANEOUS | 0 refills | Status: DC

## 2019-05-24 ENCOUNTER — Other Ambulatory Visit: Payer: Self-pay | Admitting: Family Medicine

## 2019-06-17 ENCOUNTER — Other Ambulatory Visit: Payer: Self-pay | Admitting: Family Medicine

## 2019-06-21 DIAGNOSIS — G4733 Obstructive sleep apnea (adult) (pediatric): Secondary | ICD-10-CM | POA: Diagnosis not present

## 2019-06-28 ENCOUNTER — Encounter: Payer: Self-pay | Admitting: Student in an Organized Health Care Education/Training Program

## 2019-06-28 ENCOUNTER — Other Ambulatory Visit: Payer: Self-pay

## 2019-06-28 ENCOUNTER — Telehealth: Payer: Self-pay

## 2019-06-28 ENCOUNTER — Ambulatory Visit
Payer: Medicare Other | Attending: Student in an Organized Health Care Education/Training Program | Admitting: Student in an Organized Health Care Education/Training Program

## 2019-06-28 DIAGNOSIS — G8929 Other chronic pain: Secondary | ICD-10-CM

## 2019-06-28 DIAGNOSIS — M5441 Lumbago with sciatica, right side: Secondary | ICD-10-CM | POA: Diagnosis not present

## 2019-06-28 DIAGNOSIS — M47816 Spondylosis without myelopathy or radiculopathy, lumbar region: Secondary | ICD-10-CM

## 2019-06-28 DIAGNOSIS — M5442 Lumbago with sciatica, left side: Secondary | ICD-10-CM | POA: Diagnosis not present

## 2019-06-28 DIAGNOSIS — M79605 Pain in left leg: Secondary | ICD-10-CM | POA: Diagnosis not present

## 2019-06-28 DIAGNOSIS — M5417 Radiculopathy, lumbosacral region: Secondary | ICD-10-CM

## 2019-06-28 DIAGNOSIS — G894 Chronic pain syndrome: Secondary | ICD-10-CM

## 2019-06-28 DIAGNOSIS — Z981 Arthrodesis status: Secondary | ICD-10-CM

## 2019-06-28 DIAGNOSIS — Z79891 Long term (current) use of opiate analgesic: Secondary | ICD-10-CM

## 2019-06-28 DIAGNOSIS — M5416 Radiculopathy, lumbar region: Secondary | ICD-10-CM

## 2019-06-28 DIAGNOSIS — M79604 Pain in right leg: Secondary | ICD-10-CM | POA: Diagnosis not present

## 2019-06-28 DIAGNOSIS — Z9889 Other specified postprocedural states: Secondary | ICD-10-CM

## 2019-06-28 DIAGNOSIS — M792 Neuralgia and neuritis, unspecified: Secondary | ICD-10-CM

## 2019-06-28 DIAGNOSIS — E1142 Type 2 diabetes mellitus with diabetic polyneuropathy: Secondary | ICD-10-CM

## 2019-06-28 MED ORDER — DULOXETINE HCL 30 MG PO CPEP: 60.0000 mg | ORAL_CAPSULE | Freq: Every day | ORAL | 5 refills | Status: DC

## 2019-06-28 MED ORDER — HYDROCODONE-ACETAMINOPHEN 10-325 MG PO TABS: 1.0000 | ORAL_TABLET | Freq: Four times a day (QID) | ORAL | 0 refills | Status: DC | PRN

## 2019-06-28 MED ORDER — HYDROCODONE-ACETAMINOPHEN 10-325 MG PO TABS: 1.0000 | ORAL_TABLET | Freq: Four times a day (QID) | ORAL | 0 refills | Status: AC | PRN

## 2019-06-28 NOTE — Progress Notes (Signed)
Patient: [name]  Service Category: E/M  Provider: Edward Jolly, MD  DOB: Oct 31, 1965  DOS: 06/28/2019  Location: Office  MRN: [id number]  Setting: Ambulatory outpatient  Referring Provider: Myrene Buddy, MD  Type: Established Patient  Specialty: Interventional Pain Management  PCP: Drew Buddy, MD  Location: Home  Delivery: TeleHealth     Virtual Encounter - Pain Management PROVIDER NOTE: Information contained herein reflects review and annotations entered in association with encounter. Interpretation of such information and data should be left to medically-trained personnel. Information provided to patient can be located elsewhere in the medical record under "Patient Instructions". Document created using STT-dictation technology, any transcriptional errors that may result from process are unintentional.    Contact & Pharmacy Preferred: [phone] Home: [phone] (home) Mobile: [phone] (mobile) E-mail: [email or web address]  [id number] [place], [place] - [address] [address] Phone: [phone] Fax: [phone]  [facility] - [place], [place] - [address] [name] [address] Phone: [phone] Fax: [phone]  [facility], [place], [place] - [address] [place] [address] Phone: [phone] Fax: [phone]   Pre-screening  [name] offered "in-person" vs "virtual" encounter. He indicated preferring virtual for this encounter.   Reason COVID-19*  Social distancing based on CDC and AMA recommendations.   I contacted [name] on 06/28/2019 via telephone.      I clearly identified myself as Edward Jolly, MD. I verified that I was speaking with the correct person using two identifiers (Name: ALOYSIUS Murphy, and date of birth: 1966/01/25).  This visit was completed via telephone due to the restrictions of the COVID-19  pandemic. All issues as above were discussed and addressed but no physical exam was performed. If it was felt that the patient should be evaluated in the office, they were directed there. The patient verbally consented to this visit. Patient was unable to complete an audio/visual visit due to Technical difficulties and/or Lack of internet. Due to the catastrophic nature of the COVID-19 pandemic, this visit was done through audio contact only.  Location of the patient: home address (see Epic for details)  Location of the provider: office  Consent I sought verbal advanced consent from [name] for virtual visit interactions. I informed [name] of possible security and privacy concerns, risks, and limitations associated with providing "not-in-person" medical evaluation and management services. I also informed [name] of the availability of "in-person" appointments. Finally, I informed him that there would be a charge for the virtual visit and that he could be  personally, fully or partially, financially responsible for it. [name] expressed understanding and agreed to proceed.   Historic Elements   [name] is a 54 y.o. year old, male patient evaluated today after his last encounter by our practice on 06/28/2019. 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 He also  has a past  surgical history that includes I & D RIGHT INDEX FINGER PURULENT FLEXOR TENOSYNOVITIS (08-23-2001); Lumbar disc surgery (02-08-2002); transthoracic echocardiogram (05-03-2007); REATTACHMENT LEFT INDEX FINGER (1999); Appendectomy (age 44); and Mass excision (N/A, 08/07/2015). [name] has a current medication list which includes the following prescription(s): aspirin, duloxetine, [START ON 07/07/2019] hydrocodone-acetaminophen, [START ON 08/06/2019] hydrocodone-acetaminophen, insulin glargine, insulin lispro, ketoconazole, victoza, losartan, multivitamin with minerals, pravastatin, ozempic (0.25 or 0.5 mg/dose), sildenafil, b-d syringe/needle 3cc/25gx5/8, triamcinolone cream, and lidocaine. He  reports that he quit smoking about 7 years ago. His smoking use included cigarettes. He has a 48.00 pack-year smoking history. He quit smokeless tobacco use about 7 years ago.  His smokeless tobacco use included snuff. He reports current alcohol use. He reports that he does not use drugs. [name] is allergic to penicillins.   HPI  Today, he is being contacted for medication management.   No change in medical history since last visit.  Patient's pain is at baseline.  Patient continues multimodal pain regimen as prescribed.  States that it provides pain relief and improvement in functional status.  Pharmacotherapy Assessment  Analgesic:  06/07/2019  1   05/03/2019  Hydrocodone-Acetamin 10-325 MG  120.00  30 Bi Lat   [id number]   Nor (2372)   0  40.00 MME  Medicare   [place]   UDS:  Summary  Date Value Ref Range Status  07/06/2018 FINAL  Final    Comment:    ==================================================================== TOXASSURE SELECT 13 (MW) ==================================================================== Test                             Result       Flag       Units Drug Present and Declared for Prescription Verification   Hydrocodone                    2404         EXPECTED   ng/mg creat    Hydromorphone                  213          EXPECTED   ng/mg creat   Norhydrocodone                 1359         EXPECTED   ng/mg creat    Sources of hydrocodone include scheduled prescription    medications. Hydromorphone and norhydrocodone are expected    metabolites of hydrocodone. Hydromorphone is also available as a    scheduled prescription medication. ==================================================================== Test                      Result    Flag   Units      Ref Range   Creatinine              78               mg/dL      >=09 ==================================================================== Declared Medications:  The flagging and interpretation on this report are based on the  following declared medications.  Unexpected results may arise from  inaccuracies in the declared medications.  **Note: The testing scope of this panel includes these medications:  Hydrocodone (Norco)  **Note: The testing scope of this panel does not include following  reported medications:  Acetaminophen (Norco)  Aspirin (Aspirin 81)  Duloxetine (Cymbalta)  Insulin (Humalog)  Insulin (Lantus)  Lidocaine (Xylocaine)  Multivitamin  Triamcinolone (Kenalog) ==================================================================== For clinical consultation, please call [phone]. ====================================================================    Laboratory Chemistry Profile (12 mo)  Renal: 03/27/2019: BUN 17; BUN/Creatinine Ratio 22; Creatinine, Ser 0.78  Lab Results  Component Value Date   GFRAA 122 03/27/2019   GFRNONAA 105 03/27/2019   Hepatic: 07/06/2018: Albumin 4.4 Lab Results  Component Value Date   AST 29 07/06/2018   ALT 25 03/25/2015   Other: 07/06/2018: 25-Hydroxy, Vitamin D 40; 25-Hydroxy, Vitamin D-2 <1.0; 25-Hydroxy, Vitamin D-3 40; Vitamin B-12 1,111 03/27/2019: CRP 7; Sed Rate 51 Note: Above Lab results reviewed.  Imaging  DG Foot Complete Left CLINICAL  DATA:  Diabetic foot ulcer.  EXAM: LEFT FOOT - COMPLETE 3+ VIEW  COMPARISON:  No recent.  FINDINGS: Soft tissue swelling left great toe. No radiopaque foreign body. No underlying acute bony abnormality identified. No erosive abnormality identified. If osteomyelitis remains a clinical concern MRI can be obtained.  IMPRESSION: Soft tissue swelling left great toe. No underlying bony abnormality.  Electronically Signed   By: Maisie Fus  Register   On: 03/27/2019 12:35   Assessment  The primary encounter diagnosis was Neurogenic pain. Diagnoses of Chronic pain of both lower extremities (L>R), Chronic bilateral low back pain with bilateral sciatica, Lumbar spondylosis, Long term current use of opiate analgesic, Chronic use of opiate drug for therapeutic purpose, History of lumbar laminectomy (left L4/5), Chronic pain syndrome, Diabetic polyneuropathy associated with type 2 diabetes mellitus (HCC), Lumbosacral neuritis, and Lumbar radiculopathy were also pertinent to this visit.  Plan of Care   I have changed Sherril C. Spadoni's HYDROcodone-acetaminophen. I am also having him start on HYDROcodone-acetaminophen. Additionally, I am having him maintain his aspirin, multivitamin with minerals, triamcinolone cream, ketoconazole, losartan, insulin glargine, lidocaine, pravastatin, insulin lispro, B-D SYRINGE/NEEDLE 3CC/25GX5/8, Ozempic (0.25 or 0.5 MG/DOSE), sildenafil, Victoza, and DULoxetine.  Pharmacotherapy (Medications Ordered): Meds ordered this encounter  Medications  . DULoxetine (CYMBALTA) 30 MG capsule    Sig: Take 2 capsules (60 mg total) by mouth daily.    Dispense:  60 capsule    Refill:  5  . HYDROcodone-acetaminophen (NORCO) 10-325 MG tablet    Sig: Take 1 tablet by mouth every 6 (six) hours as needed for severe pain. For chronic pain    Dispense:  120 tablet    Refill:  0    Do not place this medication, or any other prescription from our practice, on "Automatic Refill". Patient  may have prescription filled one day early if pharmacy is closed on scheduled refill date.  Marland Kitchen HYDROcodone-acetaminophen (NORCO) 10-325 MG tablet    Sig: Take 1 tablet by mouth every 6 (six) hours as needed for severe pain. For chronic pain    Dispense:  120 tablet    Refill:  0    Do not place this medication, or any other prescription from our practice, on "Automatic Refill". Patient may have prescription filled one day early if pharmacy is closed on scheduled refill date.   Follow-up plan:   Return in about 9 weeks (around 08/30/2019) for Medication Management, virtual.    Recent Visits Date Type Provider Dept  05/03/19 Office Visit Edward Jolly, MD [facility]  Showing recent visits within past 90 days and meeting all other requirements   Today's Visits Date Type Provider Dept  06/28/19 Office Visit Edward Jolly, MD [facility]  Showing today's visits and meeting all other requirements   Future Appointments No visits were found meeting these  conditions.  Showing future appointments within next 90 days and meeting all other requirements   I discussed the assessment and treatment plan with the patient. The patient was provided an opportunity to ask questions and all were answered. The patient agreed with the plan and demonstrated an understanding of the instructions.  Patient advised to call back or seek an in-person evaluation if the symptoms or condition worsens.  Total duration of non-face-to-face encounter: 12minutes.  Note by: Gillis Santa, MD Date: 06/28/2019; Time: 1:08 PM

## 2019-06-28 NOTE — Telephone Encounter (Signed)
LM for patient to call us prior to appointment so that we may go over questions for virtual appointment

## 2019-08-15 ENCOUNTER — Other Ambulatory Visit: Payer: Self-pay | Admitting: Family Medicine

## 2019-08-15 DIAGNOSIS — E1142 Type 2 diabetes mellitus with diabetic polyneuropathy: Secondary | ICD-10-CM

## 2019-08-21 ENCOUNTER — Other Ambulatory Visit: Payer: Self-pay

## 2019-08-21 ENCOUNTER — Ambulatory Visit
Payer: Medicare Other | Attending: Student in an Organized Health Care Education/Training Program | Admitting: Student in an Organized Health Care Education/Training Program

## 2019-08-21 ENCOUNTER — Encounter: Payer: Self-pay | Admitting: Student in an Organized Health Care Education/Training Program

## 2019-08-21 VITALS — Ht 68.0 in | Wt 270.0 lb

## 2019-08-21 DIAGNOSIS — E1142 Type 2 diabetes mellitus with diabetic polyneuropathy: Secondary | ICD-10-CM | POA: Diagnosis not present

## 2019-08-21 DIAGNOSIS — G8929 Other chronic pain: Secondary | ICD-10-CM

## 2019-08-21 DIAGNOSIS — G894 Chronic pain syndrome: Secondary | ICD-10-CM

## 2019-08-21 DIAGNOSIS — M5442 Lumbago with sciatica, left side: Secondary | ICD-10-CM

## 2019-08-21 DIAGNOSIS — M792 Neuralgia and neuritis, unspecified: Secondary | ICD-10-CM

## 2019-08-21 DIAGNOSIS — M4726 Other spondylosis with radiculopathy, lumbar region: Secondary | ICD-10-CM

## 2019-08-21 DIAGNOSIS — M47816 Spondylosis without myelopathy or radiculopathy, lumbar region: Secondary | ICD-10-CM

## 2019-08-21 DIAGNOSIS — Z79891 Long term (current) use of opiate analgesic: Secondary | ICD-10-CM

## 2019-08-21 DIAGNOSIS — M5441 Lumbago with sciatica, right side: Secondary | ICD-10-CM

## 2019-08-21 DIAGNOSIS — Z9889 Other specified postprocedural states: Secondary | ICD-10-CM | POA: Diagnosis not present

## 2019-08-21 DIAGNOSIS — M5416 Radiculopathy, lumbar region: Secondary | ICD-10-CM

## 2019-08-21 MED ORDER — HYDROCODONE-ACETAMINOPHEN 10-325 MG PO TABS: 1.0000 | ORAL_TABLET | Freq: Four times a day (QID) | ORAL | 0 refills | Status: AC | PRN

## 2019-08-21 MED ORDER — HYDROCODONE-ACETAMINOPHEN 10-325 MG PO TABS: 1.0000 | ORAL_TABLET | Freq: Four times a day (QID) | ORAL | 0 refills | Status: DC | PRN

## 2019-08-21 NOTE — Progress Notes (Signed)
Patient: [name]  Service Category: E/M  Provider: Gillis Santa, MD  DOB: [birth year]  DOS: 08/21/2019  Location: Office  MRN: [id number]  Setting: Ambulatory outpatient  Referring Provider: Guadalupe Dawn, MD  Type: Established Patient  Specialty: Interventional Pain Management  PCP: Guadalupe Dawn, MD  Location: Home  Delivery: TeleHealth     Virtual Encounter - Pain Management PROVIDER NOTE: Information contained herein reflects review and annotations entered in association with encounter. Interpretation of such information and data should be left to medically-trained personnel. Information provided to patient can be located elsewhere in the medical record under "Patient Instructions". Document created using STT-dictation technology, any transcriptional errors that may result from process are unintentional.    Contact & Pharmacy Preferred: [phone] Home: [phone] (home) Mobile: [phone] (mobile) E-mail: [email or web address]  [id number] [address] [address]Phone: [phone]: [phone] [place]Phone: [phone]: [phone] [address] [address]Phone: [phone]: [phone]  Pre-screening  Mr. BTelford Naboffered "in-person" vs "virtual" encounter. He indicated preferring virtual for this encounter.   Reason COVID-19*  Social distancing based on CDC and AMA recommendations.   I contacted BLennie Hummeron 08/21/2019 via telephone.      I clearly identified myself as BGillis Santa MD. I verified that I was speaking with the correct person using two identifiers (Name: BBRUNO LEACH and date of birth: [birth year].  This visit was completed via telephone due to the restrictions of the COVID-19  pandemic. All issues as above were discussed and addressed but no physical exam was performed. If it was felt that the patient should be evaluated in the office, they were directed there. The patient verbally consented to this visit. Patient was unable to complete an audio/visual visit due to Technical difficulties and/or Lack of internet. Due to the catastrophic nature of the COVID-19 pandemic, this visit was done through audio contact only.  Location of the patient: home address (see Epic for details)  Location of the provider: office Consent I sought verbal advanced consent from BLennie Hummerfor virtual visit interactions. I informed Mr. BCorralesof possible security and privacy concerns, risks, and limitations associated with providing "not-in-person" medical evaluation and management services. I also informed Mr. BBazinetof the availability of "in-person" appointments. Finally, I informed him that there would be a charge for the virtual visit and that he could be  personally, fully or partially, financially responsible for it. Mr. BOrtmanexpressed understanding and agreed to proceed.   Historic Elements   Mr. BJILLIAN PIANKAis a 54y.o. year old, male patient evaluated today after his last contact with our practice on 06/28/2019. Mr. BBeville has a past medical history of Arthritis, Bilateral edema of lower extremity, Bulge of cervical disc without myelopathy, Chronic low back pain, Disorder of subcutaneous tissue, Environmental allergies, History of cervical fracture, History of multiple concussions, History of pneumothorax, History of seizures (pt states takes gabapentin to control seizures and for neuropathy---  followed by PCP  dr  dLorenso Courier([facility])  seizures are note mentioned as hx in her last note or previous notes), Hypertension, OBESITY, NOS (08/11/2006), OSA on CPAP, Peripheral neuropathy, Recurrent boils, Type 2 diabetes mellitus ([place], and Wears glasses. He also  has a past  surgical history that includes I & D RIGHT INDEX FINGER PURULENT FLEXOR TENOSYNOVITIS (08-23-2001); Lumbar disc surgery (02-08-2002); transthoracic echocardiogram (05-03-2007); REATTACHMENT LEFT INDEX FINGER (1999); Appendectomy (age 18); and Mass excision (N/A, 08/07/2015). Mr. Whichard has a current medication list which includes the following prescription(s): aspirin, duloxetine, [START ON 09/06/2019] hydrocodone-acetaminophen, [START ON 10/06/2019] hydrocodone-acetaminophen, [START ON 11/05/2019] hydrocodone-acetaminophen, insulin glargine, insulin lispro, ketoconazole, lidocaine, victoza, losartan, multivitamin with minerals, pravastatin, sildenafil, b-d syringe/needle 3cc/25gx5/8, and triamcinolone cream. He  reports that he quit smoking about 7 years ago. His smoking use included cigarettes. He has a 48.00 pack-year smoking history. He quit smokeless tobacco use about 8 years ago.  His smokeless tobacco use included snuff. He reports current alcohol use. He reports that he does not use drugs. Mr. Marchetta is allergic to penicillins.   HPI  Today, he is being contacted for medication management.   No change in medical history since last visit.  Patient's pain is at baseline.  Patient continues multimodal pain regimen as prescribed.  States that it provides pain relief and improvement in functional status.  Pharmacotherapy Assessment  Analgesic:  08/07/2019  1   06/28/2019  Hydrocodone-Acetamin 10-325 MG  120.00  30 Bi Lat   [id number]   Nor (2372)   0  40.00 MME  Medicare   [place]    Monitoring: [place] PMP: PDMP reviewed during this encounter.       Pharmacotherapy: No side-effects or adverse reactions reported. Compliance: No problems identified. Effectiveness: Clinically acceptable. Plan: Refer to "POC".  UDS:  Summary  Date Value Ref Range Status  07/06/2018 FINAL  Final    Comment:    ==================================================================== TOXASSURE SELECT 13  (MW) ==================================================================== Test                             Result       Flag       Units Drug Present and Declared for Prescription Verification   Hydrocodone                    2404         EXPECTED   ng/mg creat   Hydromorphone                  213          EXPECTED   ng/mg creat   Norhydrocodone                 1359         EXPECTED   ng/mg creat    Sources of hydrocodone include scheduled prescription    medications. Hydromorphone and norhydrocodone are expected    metabolites of hydrocodone. Hydromorphone is also available as a    scheduled prescription medication. ==================================================================== Test                      Result    Flag   Units      Ref Range   Creatinine              78               mg/dL      >=20 ==================================================================== Declared Medications:  The flagging and interpretation on this report are based on the  following declared medications.  Unexpected results may arise from  inaccuracies in the declared medications.  **Note: The testing scope of this panel includes  these medications:  Hydrocodone (Norco)  **Note: The testing scope of this panel does not include following  reported medications:  Acetaminophen (Norco)  Aspirin (Aspirin 81)  Duloxetine (Cymbalta)  Insulin (Humalog)  Insulin (Lantus)  Lidocaine (Xylocaine)  Multivitamin  Triamcinolone (Kenalog) ==================================================================== For clinical consultation, please call [phone]. ====================================================================    Laboratory Chemistry Profile   Renal Lab Results  Component Value Date   BUN 17 03/27/2019   CREATININE 0.78 03/27/2019   BCR 22 (H) 03/27/2019   GFRAA 122 03/27/2019   GFRNONAA 105 03/27/2019    Hepatic Lab Results  Component Value Date   AST 29 07/06/2018   ALT 25  03/25/2015   ALBUMIN 4.4 07/06/2018   ALKPHOS 82 07/06/2018   HCVAB NEG 04/02/2008   LIPASE 24 03/25/2015    Electrolytes Lab Results  Component Value Date   NA 133 (L) 03/27/2019   K 4.3 03/27/2019   CL 92 (L) 03/27/2019   CALCIUM 9.3 03/27/2019   MG 2.1 07/06/2018    Bone Lab Results  Component Value Date   25OHVITD1 40 07/06/2018   25OHVITD2 <1.0 07/06/2018   25OHVITD3 40 07/06/2018   TESTOSTERONE 174 (L) 05/16/2017    Inflammation (CRP: Acute Phase) (ESR: Chronic Phase) Lab Results  Component Value Date   CRP 7 03/27/2019   ESRSEDRATE 51 (H) 03/27/2019      Note: Above Lab results reviewed.  Imaging  DG Foot Complete Left CLINICAL DATA:  Diabetic foot ulcer.  EXAM: LEFT FOOT - COMPLETE 3+ VIEW  COMPARISON:  No recent.  FINDINGS: Soft tissue swelling left great toe. No radiopaque foreign body. No underlying acute bony abnormality identified. No erosive abnormality identified. If osteomyelitis remains a clinical concern MRI can be obtained.  IMPRESSION: Soft tissue swelling left great toe. No underlying bony abnormality.  Electronically Signed   By: Marcello Moores  Register   On: 03/27/2019 12:35  Assessment  The primary encounter diagnosis was Chronic pain syndrome. Diagnoses of History of lumbar laminectomy (left L4/5), Diabetic polyneuropathy associated with type 2 diabetes mellitus ([place]), Chronic use of opiate drug for therapeutic purpose, Long term current use of opiate analgesic, Lumbar spondylosis, Neurogenic pain, Lumbar radiculopathy, Chronic pain of both lower extremities (L>R), and Chronic bilateral low back pain with bilateral sciatica were also pertinent to this visit.  Plan of Care   Mr. MOUHAMAD TEED has a current medication list which includes the following long-term medication(s): duloxetine, insulin glargine, insulin lispro, lidocaine, victoza, losartan, and pravastatin.  Pharmacotherapy (Medications Ordered): Meds ordered this encounter   Medications  . HYDROcodone-acetaminophen (NORCO) 10-325 MG tablet    Sig: Take 1 tablet by mouth every 6 (six) hours as needed for severe pain. For chronic pain    Dispense:  120 tablet    Refill:  0    Do not place this medication, or any other prescription from our practice, on "Automatic Refill". Patient may have prescription filled one day early if pharmacy is closed on scheduled refill date.  Marland Kitchen HYDROcodone-acetaminophen (NORCO) 10-325 MG tablet    Sig: Take 1 tablet by mouth every 6 (six) hours as needed for severe pain. For chronic pain    Dispense:  120 tablet    Refill:  0    Do not place this medication, or any other prescription from our practice, on "Automatic Refill". Patient may have prescription filled one day early if pharmacy is closed on scheduled refill date.  Marland Kitchen HYDROcodone-acetaminophen (NORCO) 10-325 MG tablet    Sig: Take 1  tablet by mouth every 6 (six) hours as needed for severe pain. For chronic pain    Dispense:  120 tablet    Refill:  0    Do not place this medication, or any other prescription from our practice, on "Automatic Refill". Patient may have prescription filled one day early if pharmacy is closed on scheduled refill date.   Follow-up plan:   Return in about 3 months (around 12/01/2019) for Medication Management, in person.       Recent Visits Date Type Provider Dept  06/28/19 Office Visit Gillis Santa, MD [facility]  Showing recent visits within past 90 days and meeting all other requirements   Today's Visits Date Type Provider Dept  08/21/19 Office Visit Gillis Santa, MD [facility]  Showing today's visits and meeting all other requirements   Future Appointments No visits were found meeting these conditions.  Showing future appointments within next 90 days and meeting all other requirements   I discussed the assessment and treatment plan with the patient. The patient was provided an opportunity to ask questions and all  were answered. The patient agreed with the plan and demonstrated an understanding of the instructions.  Patient advised to call back or seek an in-person evaluation if the symptoms or condition worsens.  Duration of encounter: 25 minutes.  Note by: Gillis Santa, MD Date: 08/21/2019; Time: 10:00 AM

## 2019-08-30 ENCOUNTER — Encounter: Payer: Medicare Other | Admitting: Student in an Organized Health Care Education/Training Program

## 2019-08-31 ENCOUNTER — Other Ambulatory Visit: Payer: Self-pay | Admitting: Family Medicine

## 2019-09-13 ENCOUNTER — Other Ambulatory Visit: Payer: Self-pay | Admitting: Family Medicine

## 2019-09-30 ENCOUNTER — Other Ambulatory Visit: Payer: Self-pay | Admitting: Family Medicine

## 2019-10-04 DIAGNOSIS — G4733 Obstructive sleep apnea (adult) (pediatric): Secondary | ICD-10-CM | POA: Diagnosis not present

## 2019-10-04 NOTE — Telephone Encounter (Signed)
LVM for patient to call office to schedule appointment to follow up on BP and DM2.  Glennie Hawk, CMA

## 2019-11-26 DIAGNOSIS — G894 Chronic pain syndrome: Secondary | ICD-10-CM | POA: Diagnosis not present

## 2019-11-27 ENCOUNTER — Ambulatory Visit (INDEPENDENT_AMBULATORY_CARE_PROVIDER_SITE_OTHER): Payer: Medicare Other | Admitting: Family Medicine

## 2019-11-27 ENCOUNTER — Encounter: Payer: Self-pay | Admitting: Student in an Organized Health Care Education/Training Program

## 2019-11-27 ENCOUNTER — Encounter: Payer: Self-pay | Admitting: Family Medicine

## 2019-11-27 ENCOUNTER — Ambulatory Visit
Payer: Medicare Other | Attending: Student in an Organized Health Care Education/Training Program | Admitting: Student in an Organized Health Care Education/Training Program

## 2019-11-27 ENCOUNTER — Other Ambulatory Visit: Payer: Self-pay

## 2019-11-27 VITALS — BP 164/102 | HR 96 | Temp 98.1°F | Resp 20 | Ht 67.5 in | Wt 275.0 lb

## 2019-11-27 VITALS — BP 138/86 | HR 103 | Ht 67.5 in | Wt 284.6 lb

## 2019-11-27 DIAGNOSIS — E1142 Type 2 diabetes mellitus with diabetic polyneuropathy: Secondary | ICD-10-CM | POA: Diagnosis not present

## 2019-11-27 DIAGNOSIS — M79604 Pain in right leg: Secondary | ICD-10-CM | POA: Insufficient documentation

## 2019-11-27 DIAGNOSIS — M79605 Pain in left leg: Secondary | ICD-10-CM | POA: Diagnosis not present

## 2019-11-27 DIAGNOSIS — E1159 Type 2 diabetes mellitus with other circulatory complications: Secondary | ICD-10-CM | POA: Diagnosis not present

## 2019-11-27 DIAGNOSIS — G894 Chronic pain syndrome: Secondary | ICD-10-CM | POA: Insufficient documentation

## 2019-11-27 DIAGNOSIS — M5441 Lumbago with sciatica, right side: Secondary | ICD-10-CM | POA: Insufficient documentation

## 2019-11-27 DIAGNOSIS — G8929 Other chronic pain: Secondary | ICD-10-CM | POA: Insufficient documentation

## 2019-11-27 DIAGNOSIS — I152 Hypertension secondary to endocrine disorders: Secondary | ICD-10-CM

## 2019-11-27 DIAGNOSIS — I10 Essential (primary) hypertension: Secondary | ICD-10-CM

## 2019-11-27 DIAGNOSIS — M5442 Lumbago with sciatica, left side: Secondary | ICD-10-CM | POA: Diagnosis not present

## 2019-11-27 LAB — POCT GLYCOSYLATED HEMOGLOBIN (HGB A1C): HbA1c, POC (controlled diabetic range): 7.2 % — AB (ref 0.0–7.0)

## 2019-11-27 MED ORDER — DULOXETINE HCL 30 MG PO CPEP: 60.0000 mg | ORAL_CAPSULE | Freq: Every day | ORAL | 5 refills | Status: DC

## 2019-11-27 MED ORDER — PREGABALIN 50 MG PO CAPS: 50.0000 mg | ORAL_CAPSULE | Freq: Two times a day (BID) | ORAL | 5 refills | Status: DC

## 2019-11-27 MED ORDER — HYDROCODONE-ACETAMINOPHEN 10-325 MG PO TABS: 1.0000 | ORAL_TABLET | Freq: Four times a day (QID) | ORAL | 0 refills | Status: DC | PRN

## 2019-11-27 MED ORDER — HYDROCODONE-ACETAMINOPHEN 10-325 MG PO TABS: 1.0000 | ORAL_TABLET | Freq: Four times a day (QID) | ORAL | 0 refills | Status: AC | PRN

## 2019-11-27 NOTE — Assessment & Plan Note (Signed)
Blood pressure normal here but elevated at home.  Will send for ambulatory blood pressure monitoring.  May need to start patient losartan.

## 2019-11-27 NOTE — Progress Notes (Signed)
Nursing Pain Medication Assessment:  Safety precautions to be maintained throughout the outpatient stay will include: orient to surroundings, keep bed in low position, maintain call bell within reach at all times, provide assistance with transfer out of bed and ambulation.  Medication Inspection Compliance: Pill count conducted under aseptic conditions, in front of the patient. Neither the pills nor the bottle was removed from the patient's sight at any time. Once count was completed pills were immediately returned to the patient in their original bottle.  Medication: Hydrocodone/APAP Pill/Patch Count: 41 of 120 pills remain Pill/Patch Appearance: Markings consistent with prescribed medication Bottle Appearance: Standard pharmacy container. Clearly labeled. Filled Date: 05 / 24 / 2021 Last Medication intake:  Has not run out   Safety precautions to be maintained throughout the outpatient stay will include: orient to surroundings, keep bed in low position, maintain call bell within reach at all times, provide assistance with transfer out of bed and ambulation.

## 2019-11-27 NOTE — Patient Instructions (Signed)
Patient need for ambulatory blood pressure monitoring.  Please follow-up after this with your PCP to discuss blood pressure management.

## 2019-11-27 NOTE — Progress Notes (Signed)
    SUBJECTIVE:   CHIEF COMPLAINT / HPI:   BP check Patient's history of hypertension, taken off antihypertensives due to blood pressure being normal.  Blood pressure normal today.  But renal protection.  at home has had elevated readings with systolics in the 140s to 150s.  Denies headaches, CP, no leg swelling.   Diabetes mellitus type 2 A1c greatly improved from 9 to 7 today.  Patient takes Lantus 50 units once daily, lispro 20 units 3 times daily with meals, Victoza.  Denies polyuria and polydipsia.  Patient takes pravastatin.  Patient is was previously on losartan but is not taking it now.  Vaccinations: Patient needs Covid vaccine, however does not want to get it due to concerns side effects  PERTINENT  PMH / PSH: Peripheral neuropathy  OBJECTIVE:   BP 138/86   Pulse (!) 103   Ht 5' 7.5" (1.715 m)   Wt 284 lb 9.6 oz (129.1 kg)   SpO2 95%   BMI 43.92 kg/m   Gen: NAD, resting comfortably CV: RRR with no murmurs appreciated Pulm: NWOB, CTAB with no crackles, wheezes, or rhonchi GI: . Soft, Nontender, Nondistended. MSK: no edema, cyanosis, or clubbing noted Skin: warm, dry Neuro: grossly normal, moves all extremities Psych: Normal affect and thought content  Diabetic Foot Exam - Simple   Simple Foot Form Diabetic Foot exam was performed with the following findings: Yes 11/27/2019  3:45 PM  Visual Inspection No deformities, no ulcerations, no other skin breakdown bilaterally: Yes Sensation Testing See comments: Yes Pulse Check Posterior Tibialis and Dorsalis pulse intact bilaterally: Yes Comments Also has venous stasis woody appearance on bilateral lower extremities Negative monofilament test on left big toe and left ball of foot, intact remainder foot      ASSESSMENT/PLAN:   Hypertension associated with diabetes (HCC) Blood pressure normal here but elevated at home.  Will send for ambulatory blood pressure monitoring.  May need to start patient losartan.  Type  2 diabetes mellitus with neurologic complication Well-controlled.  Continue current regimen.  Recently had ophthalmology exam.  We are getting release of records for this.     Garnette Gunner, MD [facility]

## 2019-11-27 NOTE — Progress Notes (Signed)
PROVIDER NOTE: Information contained herein reflects review and annotations entered in association with encounter. Interpretation of such information and data should be left to medically-trained personnel. Information provided to patient can be located elsewhere in the medical record under "Patient Instructions". Document created using STT-dictation technology, any transcriptional errors that may result from process are unintentional.    Patient: [name]  Service Category: E/M  Provider: Gillis Santa, MD  DOB: 03-Feb-1966  DOS: 11/27/2019  Specialty: Interventional Pain Management  MRN: [id number]  Setting: Ambulatory outpatient  PCP: Guadalupe Dawn, MD  Type: Established Patient    Referring Provider: Guadalupe Dawn, MD  Location: Office  Delivery: Face-to-face     HPI  Reason for encounter: [name], a 54 y.o. year old male, is here today for evaluation and management of his Diabetic polyneuropathy associated with type 2 diabetes mellitus ([place]) [E11.42]. Mr. Gauss primary complain today is Medication Refill Last encounter: Practice (08/21/2019). My last encounter with him was on 08/21/2019. Pertinent problems: Mr. Dimmick has Chronic pain of both lower extremities (L>R); Chronic bilateral low back pain with bilateral sciatica; Chronic pain syndrome; Long term current use of opiate analgesic; Neurogenic pain; Morbid obesity ([place]); Lumbar radiculopathy; Diabetic polyneuropathy associated with type 2 diabetes mellitus ([place]); and History of lumbar laminectomy (left L4/5) on their pertinent problem list. Pain Assessment: Severity of Chronic pain is reported as a 5 /10. Location: Back Lower/From lower back legs bilateral but left is worse pain and tingles. Both feet hurting as well.. Onset: More than a month ago. Quality: Constant, Numbness, Pins and needles, Burning. Timing: Constant. Modifying factor(s): Hydrocodone-acetamin, lidocain cream. Vitals:  height is 5' 7.5" (1.715 m) and  weight is 275 lb (124.7 kg). His temperature is 98.1 F (36.7 C). His blood pressure is 164/102 (abnormal) and his pulse is 96. His respiration is 20 and oxygen saturation is 98%.   No change in medical history since last visit.  Patient's pain is at baseline.  Patient continues multimodal pain regimen as prescribed.  States that it provides pain relief and improvement in functional status. Requesting to start Lyrica which she was on many years ago for neuropathic pain of his feet.  Pharmacotherapy Assessment   Analgesic: 11/05/2019  1   08/21/2019  Hydrocodone-Acetamin 10-325 MG  120.00  30 Bi Lat   [id number]   Nor (2372)   0  40.00 MME  Medicare   [place]     Monitoring: [place] PMP: PDMP reviewed during this encounter.       Pharmacotherapy: No side-effects or adverse reactions reported. Compliance: No problems identified. Effectiveness: Clinically acceptable.  UDS:  Summary  Date Value Ref Range Status  07/06/2018 FINAL  Final    Comment:    ==================================================================== TOXASSURE SELECT 13 (MW) ==================================================================== Test                             Result       Flag       Units Drug Present and Declared for Prescription Verification   Hydrocodone                    2404         EXPECTED   ng/mg creat   Hydromorphone                  213          EXPECTED   ng/mg creat  Norhydrocodone                 1359         EXPECTED   ng/mg creat    Sources of hydrocodone include scheduled prescription    medications. Hydromorphone and norhydrocodone are expected    metabolites of hydrocodone. Hydromorphone is also available as a    scheduled prescription medication. ==================================================================== Test                      Result    Flag   Units      Ref Range   Creatinine              78               mg/dL       >=20 ==================================================================== Declared Medications:  The flagging and interpretation on this report are based on the  following declared medications.  Unexpected results may arise from  inaccuracies in the declared medications.  **Note: The testing scope of this panel includes these medications:  Hydrocodone (Norco)  **Note: The testing scope of this panel does not include following  reported medications:  Acetaminophen (Norco)  Aspirin (Aspirin 81)  Duloxetine (Cymbalta)  Insulin (Humalog)  Insulin (Lantus)  Lidocaine (Xylocaine)  Multivitamin  Triamcinolone (Kenalog) ==================================================================== For clinical consultation, please call [phone]. ====================================================================       ROS  Constitutional: Denies any fever or chills Gastrointestinal: No reported hemesis, hematochezia, vomiting, or acute GI distress Musculoskeletal: Denies any acute onset joint swelling, redness, loss of ROM, or weakness Neurological: No reported episodes of acute onset apraxia, aphasia, dysarthria, agnosia, amnesia, paralysis, loss of coordination, or loss of consciousness  Medication Review  DULoxetine, HYDROcodone-acetaminophen, SYRINGE-NEEDLE (DISP) 3 ML, aspirin, insulin glargine, insulin lispro, ketoconazole, lidocaine, liraglutide, losartan, multivitamin with minerals, pravastatin, pregabalin, sildenafil, and triamcinolone cream  History Review  Allergy: Mr. Knaus is allergic to penicillins and metformin and related. Drug: Mr. Hinchliffe  reports no history of drug use. Alcohol:  reports current alcohol use. Tobacco:  reports that he quit smoking about 7 years ago. His smoking use included cigarettes. He has a 48.00 pack-year smoking history. He quit smokeless tobacco use about 8 years ago.  His smokeless tobacco use included snuff. Social: Mr. Amend  reports that  he quit smoking about 7 years ago. His smoking use included cigarettes. He has a 48.00 pack-year smoking history. He quit smokeless tobacco use about 8 years ago.  His smokeless tobacco use included snuff. He reports current alcohol use. He reports that he does not use drugs. 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 Surgical: Mr. Jacobsen  has a past surgical history that includes I & D RIGHT INDEX FINGER PURULENT FLEXOR TENOSYNOVITIS (08-23-2001); Lumbar disc surgery (02-08-2002); transthoracic echocardiogram (05-03-2007); REATTACHMENT LEFT INDEX FINGER (1999); Appendectomy (age 64); and Mass excision (N/A, 08/07/2015). Family: family history includes Asthma in his maternal aunt, maternal grandfather, maternal grandmother, maternal  uncle, and mother; COPD in his mother; Diabetes in his maternal aunt, maternal grandfather, maternal grandmother, maternal uncle, and mother; Obesity in his maternal aunt, maternal grandfather, maternal grandmother, maternal uncle, and mother.  Laboratory Chemistry Profile   Renal Lab Results  Component Value Date   BUN 17 03/27/2019   CREATININE 0.78 03/27/2019   BCR 22 (H) 03/27/2019   GFRAA 122 03/27/2019   GFRNONAA 105 03/27/2019     Hepatic Lab Results  Component Value Date   AST 29 07/06/2018   ALT 25 03/25/2015   ALBUMIN 4.4 07/06/2018   ALKPHOS 82 07/06/2018    HCVAB NEG 04/02/2008   LIPASE 24 03/25/2015     Electrolytes Lab Results  Component Value Date   NA 133 (L) 03/27/2019   K 4.3 03/27/2019   CL 92 (L) 03/27/2019   CALCIUM 9.3 03/27/2019   MG 2.1 07/06/2018     Bone Lab Results  Component Value Date   25OHVITD1 40 07/06/2018   25OHVITD2 <1.0 07/06/2018   25OHVITD3 40 07/06/2018   TESTOSTERONE 174 (L) 05/16/2017     Inflammation (CRP: Acute Phase) (ESR: Chronic Phase) Lab Results  Component Value Date   CRP 7 03/27/2019   ESRSEDRATE 51 (H) 03/27/2019       Note: Above Lab results reviewed.  Recent Imaging Review  DG Foot Complete Left CLINICAL DATA:  Diabetic foot ulcer.  EXAM: LEFT FOOT - COMPLETE 3+ VIEW  COMPARISON:  No recent.  FINDINGS: Soft tissue swelling left great toe. No radiopaque foreign body. No underlying acute bony abnormality identified. No erosive abnormality identified. If osteomyelitis remains a clinical concern MRI can be obtained.  IMPRESSION: Soft tissue swelling left great toe. No underlying bony abnormality.  Electronically Signed   By: Marcello Moores  Register   On: 03/27/2019 12:35 Note: Reviewed        Physical Exam  General appearance: Well nourished, well developed, and well hydrated. In no apparent acute distress Mental status: Alert, oriented x 3 (person, place, & time)       Respiratory: No evidence of acute respiratory distress Eyes: PERLA Vitals: BP (!) 164/102 (BP Location: Right Arm, Patient Position: Sitting, Cuff Size: Normal)   Pulse 96   Temp 98.1 F (36.7 C)   Resp 20   Ht 5' 7.5" (1.715 m)   Wt 275 lb (124.7 kg)   SpO2 98%   BMI 42.44 kg/m  BMI: Estimated body mass index is 42.44 kg/m as calculated from the following:   Height as of this encounter: 5' 7.5" (1.715 m).   Weight as of this encounter: 275 lb (124.7 kg). Ideal: Ideal body weight: 67.2 kg (148 lb 4.1 oz) Adjusted ideal body weight: 90.2 kg (198 lb 15.3 oz)   Lumbar Spine Area Exam  Skin & Axial  Inspection: Well healed scar from previous spine surgery detected Alignment: Symmetrical Functional ROM: Pain restricted ROM       Stability: No instability detected Muscle Tone/Strength: Functionally intact. No obvious neuro-muscular anomalies detected. Sensory (Neurological): Unimpaired   Gait & Posture Assessment  Ambulation: Unassisted Gait: Antalgic Posture: WNL   Lower Extremity Exam    Side: Right lower extremity  Side: Left lower extremity  Stability: No instability observed          Stability: No instability observed          Skin & Extremity Inspection: Skin color, temperature, and hair growth are WNL. No peripheral edema or cyanosis. No masses, redness, swelling, asymmetry, or associated skin lesions.  No contractures.  Skin & Extremity Inspection: Skin color, temperature, and hair growth are WNL. No peripheral edema or cyanosis. No masses, redness, swelling, asymmetry, or associated skin lesions. No contractures.  Functional ROM: Unrestricted ROM                  Functional ROM: Unrestricted ROM                  Muscle Tone/Strength: Functionally intact. No obvious neuro-muscular anomalies detected.  Muscle Tone/Strength: Functionally intact. No obvious neuro-muscular anomalies detected.  Sensory (Neurological): Neuropathic pain pattern        Sensory (Neurological): Neuropathic pain pattern        DTR: Patellar: deferred today Achilles: deferred today Plantar: deferred today  DTR: Patellar: deferred today Achilles: deferred today Plantar: deferred today  Palpation: No palpable anomalies  Palpation: No palpable anomalies    Assessment   Status Diagnosis  Controlled Controlled Controlled 1. Diabetic polyneuropathy associated with type 2 diabetes mellitus ([place])   2. Chronic pain syndrome   3. Chronic pain of both lower extremities (L>R)   4. Chronic bilateral low back pain with bilateral sciatica      Updated Problems: Problem  Lumbar Radiculopathy  Diabetic  Polyneuropathy Associated With Type 2 Diabetes Mellitus (Hcc)  History of lumbar laminectomy (left L4/5)  Morbid Obesity (Hcc)  Neurogenic Pain  Chronic Bilateral Low Back Pain With Bilateral Sciatica  Chronic Pain Syndrome  Long Term Current Use of Opiate Analgesic  Chronic pain of both lower extremities (L>R)    Plan of Care   Mr. MANVIR PRABHU has a current medication list which includes the following long-term medication(s): duloxetine, insulin glargine, insulin lispro, pravastatin, victoza, lidocaine, losartan, and pregabalin.  Pharmacotherapy (Medications Ordered): Meds ordered this encounter  Medications  . HYDROcodone-acetaminophen (NORCO) 10-325 MG tablet    Sig: Take 1 tablet by mouth every 6 (six) hours as needed for severe pain. For chronic pain    Dispense:  120 tablet    Refill:  0    Do not place this medication, or any other prescription from our practice, on "Automatic Refill". Patient may have prescription filled one day early if pharmacy is closed on scheduled refill date.  Marland Kitchen HYDROcodone-acetaminophen (NORCO) 10-325 MG tablet    Sig: Take 1 tablet by mouth every 6 (six) hours as needed for severe pain. For chronic pain    Dispense:  120 tablet    Refill:  0    Do not place this medication, or any other prescription from our practice, on "Automatic Refill". Patient may have prescription filled one day early if pharmacy is closed on scheduled refill date.  Marland Kitchen HYDROcodone-acetaminophen (NORCO) 10-325 MG tablet    Sig: Take 1 tablet by mouth every 6 (six) hours as needed for severe pain. For chronic pain    Dispense:  120 tablet    Refill:  0    Do not place this medication, or any other prescription from our practice, on "Automatic Refill". Patient may have prescription filled one day early if pharmacy is closed on scheduled refill date.  . DULoxetine (CYMBALTA) 30 MG capsule    Sig: Take 2 capsules (60 mg total) by mouth daily.    Dispense:  60 capsule    Refill:   5  . pregabalin (LYRICA) 50 MG capsule    Sig: Take 1 capsule (50 mg total) by mouth 2 (two) times daily.    Dispense:  60 capsule    Refill:  5   Orders:  Orders Placed This Encounter  Procedures  . ToxASSURE Select 13 (MW), Urine    Volume: 30 ml(s). Minimum 3 ml of urine is needed. Document temperature of fresh sample. Indications: Long term (current) use of opiate analgesic (P59.163)   Follow-up plan:   Return in about 3 months (around 02/27/2020) for Medication Management, in person.   Recent Visits No visits were found meeting these conditions. Showing recent visits within past 90 days and meeting all other requirements Today's Visits Date Type Provider Dept  11/27/19 Office Visit Gillis Santa, MD [facility]  Showing today's visits and meeting all other requirements Future Appointments No visits were found meeting these conditions. Showing future appointments within next 90 days and meeting all other requirements  I discussed the assessment and treatment plan with the patient. The patient was provided an opportunity to ask questions and all were answered. The patient agreed with the plan and demonstrated an understanding of the instructions.  Patient advised to call back or seek an in-person evaluation if the symptoms or condition worsens.  Duration of encounter: 30 minutes.  Note by: Gillis Santa, MD Date: 11/27/2019; Time: 9:12 AM

## 2019-11-27 NOTE — Patient Instructions (Signed)
____________________________________________________________________________________________  Medication Rules  Applies to: All patients receiving prescriptions (written or electronic).  Pharmacy of record: Pharmacy where electronic prescriptions will be sent. If written prescriptions are taken to a different pharmacy, please inform the nursing staff. The pharmacy listed in the electronic medical record should be the one where you would like electronic prescriptions to be sent.  Prescription refills: Only during scheduled appointments. Applies to both, written and electronic prescriptions.  NOTE: The following applies primarily to controlled substances (Opioid* Pain Medications).   Patient's responsibilities: 1. Pain Pills: Bring all pain pills to every appointment (except for procedure appointments). 2. Pill Bottles: Bring pills in original pharmacy bottle. Always bring newest bottle. Bring bottle, even if empty. 3. Medication refills: You are responsible for knowing and keeping track of what medications you need refilled. The day before your appointment, write a list of all prescriptions that need to be refilled. Bring that list to your appointment and give it to the admitting nurse. Prescriptions will be written only during appointments. If you forget a medication, it will not be "Called in", "Faxed", or "electronically sent". You will need to get another appointment to get these prescribed. 4. Prescription Accuracy: You are responsible for carefully inspecting your prescriptions before leaving our office. Have the discharge nurse carefully go over each prescription with you, before taking them home. Make sure that your name is accurately spelled, that your address is correct. Check the name and dose of your medication to make sure it is accurate. Check the number of pills, and the written instructions to make sure they are clear and accurate. Make sure that you are given enough medication to last  until your next medication refill appointment. 5. Taking Medication: Take medication as prescribed. Never take more pills than instructed. Never take medication more frequently than prescribed. Taking less pills or less frequently is permitted and encouraged, when it comes to controlled substances (written prescriptions).  6. Inform other Doctors: Always inform, all of your healthcare providers, of all the medications you take. 7. Pain Medication from other Providers: You are not allowed to accept any additional pain medication from any other Doctor or Healthcare provider. There are two exceptions to this rule. (see below) In the event that you require additional pain medication, you are responsible for notifying us, as stated below. 8. Medication Agreement: You are responsible for carefully reading and following our Medication Agreement. This must be signed before receiving any prescriptions from our practice. Safely store a copy of your signed Agreement. Violations to the Agreement will result in no further prescriptions. (Additional copies of our Medication Agreement are available upon request.) 9. Laws, Rules, & Regulations: All patients are expected to follow all Federal and State Laws, Statutes, Rules, & Regulations. Ignorance of the Laws does not constitute a valid excuse. The use of any illegal substances is prohibited. 10. Adopted CDC guidelines & recommendations: Target dosing levels will be at or below 60 MME/day. Use of benzodiazepines** is not recommended.  Exceptions: There are only two exceptions to the rule of not receiving pain medications from other Healthcare Providers. 1. Exception #1 (Emergencies): In the event of an emergency (i.e.: accident requiring emergency care), you are allowed to receive additional pain medication. However, you are responsible for: As soon as you are able, call our office [phone], at any time of the day or night, and leave a message stating your name, the  date and nature of the emergency, and the name and dose of the medication   prescribed. In the event that your call is answered by a member of our staff, make sure to document and save the date, time, and the name of the person that took your information.  2. Exception #2 (Planned Surgery): In the event that you are scheduled by another doctor or dentist to have any type of surgery or procedure, you are allowed (for a period no longer than 30 days), to receive additional pain medication, for the acute post-op pain. However, in this case, you are responsible for picking up a copy of our "Post-op Pain Management for Surgeons" handout, and giving it to your surgeon or dentist. This document is available at our office, and does not require an appointment to obtain it. Simply go to our office during business hours (Monday-Thursday from 8:00 AM to 4:00 PM) (Friday 8:00 AM to 12:00 Noon) or if you have a scheduled appointment with us, prior to your surgery, and ask for it by name. In addition, you will need to provide us with your name, name of your surgeon, type of surgery, and date of procedure or surgery.  *Opioid medications include: morphine, codeine, oxycodone, oxymorphone, hydrocodone, hydromorphone, meperidine, tramadol, tapentadol, buprenorphine, fentanyl, methadone. **Benzodiazepine medications include: diazepam (Valium), alprazolam (Xanax), clonazepam (Klonopine), lorazepam (Ativan), clorazepate (Tranxene), chlordiazepoxide (Librium), estazolam (Prosom), oxazepam (Serax), temazepam (Restoril), triazolam (Halcion) (Last updated: 08/11/2017) ____________________________________________________________________________________________    

## 2019-11-27 NOTE — Assessment & Plan Note (Signed)
Well-controlled.  Continue current regimen.  Recently had ophthalmology exam.  We are getting release of records for this.

## 2019-11-30 DIAGNOSIS — E119 Type 2 diabetes mellitus without complications: Secondary | ICD-10-CM | POA: Diagnosis not present

## 2019-12-01 LAB — TOXASSURE SELECT 13 (MW), URINE

## 2019-12-06 ENCOUNTER — Ambulatory Visit: Payer: Medicare Other | Admitting: Pharmacist

## 2019-12-12 ENCOUNTER — Other Ambulatory Visit: Payer: Self-pay | Admitting: Family Medicine

## 2019-12-24 ENCOUNTER — Other Ambulatory Visit: Payer: Self-pay | Admitting: Family Medicine

## 2020-01-15 ENCOUNTER — Other Ambulatory Visit: Payer: Self-pay | Admitting: Family Medicine

## 2020-01-15 DIAGNOSIS — L219 Seborrheic dermatitis, unspecified: Secondary | ICD-10-CM

## 2020-02-06 ENCOUNTER — Telehealth: Payer: Self-pay

## 2020-02-06 NOTE — Telephone Encounter (Signed)
He doesn't have an August script on file at pharmacy. He was told to come back 02/26/20. Can someone check on that?

## 2020-02-06 NOTE — Telephone Encounter (Signed)
Called pharmacy to check on Rx.  Pharmacy states they did find it and are in the process of filling now.  Called patient to let him know they have it and are working on getting it filled.

## 2020-02-08 DIAGNOSIS — G4733 Obstructive sleep apnea (adult) (pediatric): Secondary | ICD-10-CM | POA: Diagnosis not present

## 2020-02-13 ENCOUNTER — Other Ambulatory Visit: Payer: Self-pay | Admitting: Family Medicine

## 2020-02-13 DIAGNOSIS — E1142 Type 2 diabetes mellitus with diabetic polyneuropathy: Secondary | ICD-10-CM

## 2020-02-15 ENCOUNTER — Other Ambulatory Visit: Payer: Self-pay | Admitting: Family Medicine

## 2020-02-15 DIAGNOSIS — L219 Seborrheic dermatitis, unspecified: Secondary | ICD-10-CM

## 2020-02-26 ENCOUNTER — Other Ambulatory Visit: Payer: Self-pay

## 2020-02-26 ENCOUNTER — Encounter: Payer: Self-pay | Admitting: Student in an Organized Health Care Education/Training Program

## 2020-02-26 ENCOUNTER — Ambulatory Visit
Payer: Medicare Other | Attending: Student in an Organized Health Care Education/Training Program | Admitting: Student in an Organized Health Care Education/Training Program

## 2020-02-26 VITALS — BP 170/110 | HR 100 | Temp 97.2°F | Resp 16 | Ht 68.0 in | Wt 278.0 lb

## 2020-02-26 DIAGNOSIS — M5442 Lumbago with sciatica, left side: Secondary | ICD-10-CM | POA: Diagnosis not present

## 2020-02-26 DIAGNOSIS — G894 Chronic pain syndrome: Secondary | ICD-10-CM | POA: Insufficient documentation

## 2020-02-26 DIAGNOSIS — M47816 Spondylosis without myelopathy or radiculopathy, lumbar region: Secondary | ICD-10-CM | POA: Insufficient documentation

## 2020-02-26 DIAGNOSIS — M5417 Radiculopathy, lumbosacral region: Secondary | ICD-10-CM | POA: Diagnosis not present

## 2020-02-26 DIAGNOSIS — M792 Neuralgia and neuritis, unspecified: Secondary | ICD-10-CM | POA: Insufficient documentation

## 2020-02-26 DIAGNOSIS — M79605 Pain in left leg: Secondary | ICD-10-CM | POA: Diagnosis not present

## 2020-02-26 DIAGNOSIS — M5441 Lumbago with sciatica, right side: Secondary | ICD-10-CM | POA: Insufficient documentation

## 2020-02-26 DIAGNOSIS — G8929 Other chronic pain: Secondary | ICD-10-CM | POA: Diagnosis not present

## 2020-02-26 DIAGNOSIS — E1142 Type 2 diabetes mellitus with diabetic polyneuropathy: Secondary | ICD-10-CM

## 2020-02-26 DIAGNOSIS — M5416 Radiculopathy, lumbar region: Secondary | ICD-10-CM | POA: Diagnosis not present

## 2020-02-26 DIAGNOSIS — M79604 Pain in right leg: Secondary | ICD-10-CM | POA: Insufficient documentation

## 2020-02-26 MED ORDER — HYDROCODONE-ACETAMINOPHEN 10-325 MG PO TABS: 1.0000 | ORAL_TABLET | Freq: Four times a day (QID) | ORAL | 0 refills | Status: AC | PRN

## 2020-02-26 MED ORDER — HYDROCODONE-ACETAMINOPHEN 10-325 MG PO TABS: 1.0000 | ORAL_TABLET | Freq: Four times a day (QID) | ORAL | 0 refills | Status: DC | PRN

## 2020-02-26 NOTE — Progress Notes (Signed)
PROVIDER NOTE: Information contained herein reflects review and annotations entered in association with encounter. Interpretation of such information and data should be left to medically-trained personnel. Information provided to patient can be located elsewhere in the medical record under "Patient Instructions". Document created using STT-dictation technology, any transcriptional errors that may result from process are unintentional.    Patient: [name]  Service Category: E/M  Provider: Gillis Santa, MD  DOB: 1965-11-24  DOS: 02/26/2020  Specialty: Interventional Pain Management  MRN: [id number]  Setting: Ambulatory outpatient  PCP: Delora Fuel, MD  Type: Established Patient    Referring Provider: Guadalupe Dawn, MD  Location: Office  Delivery: Face-to-face     HPI  Reason for encounter: [name], a 54 y.o. year old male, is here today for evaluation and management of his Chronic pain syndrome [G89.4]. [name] primary complain today is Back Pain (lower) Last encounter: Practice (02/06/2020). My last encounter with him was on 11/27/2019. Pertinent problems: [name] has Chronic pain of both lower extremities (L>R); Chronic bilateral low back pain with bilateral sciatica; Chronic pain syndrome; Long term current use of opiate analgesic; Neurogenic pain; Morbid obesity ([place]); Lumbar radiculopathy; Diabetic polyneuropathy associated with type 2 diabetes mellitus ([place]); and History of lumbar laminectomy (left L4/5) on their pertinent problem list. Pain Assessment: Severity of Chronic pain is reported as a 5 /10. Location: Back Left, Lower/down left leg, including left foot/toes. Onset: More than a month ago. Quality: Sharp, Burning, Dull (itchy). Timing: Constant. Modifying factor(s): meds. Vitals:  height is _0  (1.727 m) and weight is 278 lb (126.1 kg). His temporal temperature is 97.2 F (36.2 C) (abnormal). His blood pressure is 170/110 (abnormal) and his pulse is 100. His  respiration is 16 and oxygen saturation is 99%.   No change in medical history since last visit.  Patient's pain is at baseline.  Patient continues multimodal pain regimen as prescribed.  States that it provides pain relief and improvement in functional status.   Pharmacotherapy Assessment   02/06/2020  1   11/27/2019  Hydrocodone-Acetamin 10-325 MG  120.00  30 Bi Lat   [id number]   Nor (2372)   0/0  40.00 MME  Medicare   [place]     Analgesic: Norco 10 mg 4 times daily as needed, quantity 120/month; MME 40  Monitoring: [place] PMP: PDMP not reviewed this encounter.       Pharmacotherapy: No side-effects or adverse reactions reported. Compliance: No problems identified. Effectiveness: Clinically acceptable.  Rise Patience, RN  02/26/2020  8:33 AM  Sign when Signing Visit Nursing Pain Medication Assessment:  Safety precautions to be maintained throughout the outpatient stay will include: orient to surroundings, keep bed in low position, maintain call bell within reach at all times, provide assistance with transfer out of bed and ambulation.  Medication Inspection Compliance: Pill count conducted under aseptic conditions, in front of the patient. Neither the pills nor the bottle was removed from the patient's sight at any time. Once count was completed pills were immediately returned to the patient in their original bottle.  Medication: Hydrocodone/APAP Pill/Patch Count: 41 of 120 pills remain Pill/Patch Appearance: Markings consistent with prescribed medication Bottle Appearance: Standard pharmacy container. Clearly labeled. Filled Date: 08 / 25 / 21 Last Medication intake:  Today    UDS:  Summary  Date Value Ref Range Status  11/26/2019 Note  Final    Comment:    ==================================================================== [employer] [facility] 13 (MW) ==================================================================== Specimen Alert Note: Urinary creatinine is  low; ability to detect  some drugs may be compromised. Interpret results with caution. (Creatinine) ==================================================================== Test                             Result       Flag       Units  Drug Present and Declared for Prescription Verification   Hydrocodone                    2556         EXPECTED   ng/mg creat   Hydromorphone                  317          EXPECTED   ng/mg creat   Dihydrocodeine                 294          EXPECTED   ng/mg creat   Norhydrocodone                 1489         EXPECTED   ng/mg creat    Sources of hydrocodone include scheduled prescription medications.    Hydromorphone, dihydrocodeine and norhydrocodone are expected    metabolites of hydrocodone. Hydromorphone and dihydrocodeine are    also available as scheduled prescription medications.  ==================================================================== Test                      Result    Flag   Units      Ref Range   Creatinine              18        LL     mg/dL      >=20 ==================================================================== Declared Medications:  The flagging and interpretation on this report are based on the  following declared medications.  Unexpected results may arise from  inaccuracies in the declared medications.   **Note: The testing scope of this panel includes these medications:   Hydrocodone   **Note: The testing scope of this panel does not include the  following reported medications:   Acetaminophen  Aspirin  Duloxetine  Insulin (Lantus)  Ketoconazole (Nizoral)  Liraglutide (Victoza)  Losartan (Cozaar)  Multivitamin  Pravastatin  Pregabalin (Lyrica)  Sildenafil  Topical Lidocaine  Triamcinolone (Kenalog) ==================================================================== For clinical consultation, please call [phone]. ====================================================================      ROS  Constitutional: Denies any  fever or chills Gastrointestinal: No reported hemesis, hematochezia, vomiting, or acute GI distress Musculoskeletal: Denies any acute onset joint swelling, redness, loss of ROM, or weakness Neurological: No reported episodes of acute onset apraxia, aphasia, dysarthria, agnosia, amnesia, paralysis, loss of coordination, or loss of consciousness  Medication Review  DULoxetine, HYDROcodone-acetaminophen, SYRINGE-NEEDLE (DISP) 3 ML, aspirin, insulin glargine, insulin lispro, ketoconazole, lidocaine, liraglutide, losartan, multivitamin with minerals, pravastatin, pregabalin, sildenafil, and triamcinolone cream  History Review  Allergy: [name] is allergic to penicillins and metformin and related. Drug: [name]  reports no history of drug use. Alcohol:  reports current alcohol use. Tobacco:  reports that he quit smoking about 8 years ago. His smoking use included cigarettes. He has a 48.00 pack-year smoking history. He quit smokeless tobacco use about 8 years ago.  His smokeless tobacco use included snuff. Social: [name]  reports that he quit smoking about 8 years ago. His smoking use included cigarettes. He has  a 48.00 pack-year smoking history. He quit smokeless tobacco use about 8 years ago.  His smokeless tobacco use included snuff. He reports current alcohol use. He reports that he does not use drugs. 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 Surgical: [name]  has a past surgical history that includes I & D RIGHT INDEX FINGER PURULENT FLEXOR TENOSYNOVITIS (08-23-2001); Lumbar disc surgery (02-08-2002); transthoracic echocardiogram (05-03-2007); REATTACHMENT LEFT INDEX FINGER (1999); Appendectomy (age 39); and Mass excision (N/A, 08/07/2015). Family: family history includes Asthma in his maternal aunt, maternal grandfather, maternal grandmother, maternal uncle, and mother; COPD in his mother; Diabetes in his maternal aunt, maternal grandfather, maternal grandmother, maternal uncle, and mother; Obesity in his maternal aunt, maternal grandfather, maternal grandmother, maternal uncle, and mother.  Laboratory Chemistry Profile   Renal Lab Results  Component Value Date   BUN 17 03/27/2019   CREATININE 0.78 03/27/2019   BCR 22 (H) 03/27/2019   GFRAA 122 03/27/2019   GFRNONAA 105 03/27/2019     Hepatic Lab Results  Component Value Date   AST 29 07/06/2018   ALT 25 03/25/2015   ALBUMIN 4.4 07/06/2018   ALKPHOS 82 07/06/2018   HCVAB NEG 04/02/2008   LIPASE 24 03/25/2015     Electrolytes Lab Results  Component Value Date   NA 133 (L) 03/27/2019   K 4.3 03/27/2019   CL 92 (L) 03/27/2019   CALCIUM 9.3 03/27/2019   MG 2.1 07/06/2018     Bone Lab Results  Component Value Date   25OHVITD1 40 07/06/2018   25OHVITD2 <1.0 07/06/2018   25OHVITD3 40 07/06/2018   TESTOSTERONE 174 (L) 05/16/2017     Inflammation (CRP: Acute Phase) (ESR: Chronic Phase) Lab Results  Component Value Date   CRP 7 03/27/2019   ESRSEDRATE 51 (H) 03/27/2019       Note: Above Lab results reviewed.  Recent Imaging Review  DG Foot Complete Left CLINICAL DATA:  Diabetic foot ulcer.  EXAM: LEFT FOOT - COMPLETE 3+ VIEW  COMPARISON:  No recent.  FINDINGS: Soft tissue swelling left great toe. No radiopaque foreign body. No underlying acute bony abnormality identified. No erosive  abnormality identified. If osteomyelitis remains a clinical concern MRI can be obtained.  IMPRESSION: Soft tissue swelling left great toe. No underlying bony abnormality.  Electronically Signed   By: Marcello Moores  Register   On: 03/27/2019 12:35 Note: Reviewed        Physical Exam  General appearance: Well nourished, well developed, and well hydrated. In no apparent acute distress Mental status: Alert, oriented x 3 (person, place, & time)       Respiratory: No evidence of acute respiratory distress Eyes: PERLA Vitals: BP (!) 170/110 (BP Location: Right Arm, Patient Position: Sitting, Cuff Size: Large)    Pulse 100    Temp (!) 97.2 F (36.2 C) (Temporal)    Resp 16    Ht _0  (1.727 m)    Wt 278 lb (126.1 kg)  SpO2 99%    BMI 42.27 kg/m  BMI: Estimated body mass index is 42.27 kg/m as calculated from the following:   Height as of this encounter: _0  (1.727 m).   Weight as of this encounter: 278 lb (126.1 kg). Ideal: Ideal body weight: 68.4 kg (150 lb 12.7 oz) Adjusted ideal body weight: 91.5 kg (201 lb 10.8 oz)  Gait & Posture Assessment  Ambulation: Unassisted Gait: Antalgic Posture: WNL   Lower Extremity Exam    Side: Right lower extremity  Side: Left lower extremity  Stability: No instability observed          Stability: No instability observed          Skin & Extremity Inspection: Skin color, temperature, and hair growth are WNL. No peripheral edema or cyanosis. No masses, redness, swelling, asymmetry, or associated skin lesions. No contractures.  Skin & Extremity Inspection: Skin color, temperature, and hair growth are WNL. No peripheral edema or cyanosis. No masses, redness, swelling, asymmetry, or associated skin lesions. No contractures.  Functional ROM: Unrestricted ROM                  Functional ROM: Unrestricted ROM                  Muscle Tone/Strength: Functionally intact. No obvious neuro-muscular anomalies detected.  Muscle Tone/Strength: Functionally  intact. No obvious neuro-muscular anomalies detected.  Sensory (Neurological): Neuropathic pain pattern        Sensory (Neurological): Neuropathic pain pattern        DTR: Patellar: deferred today Achilles: deferred today Plantar: deferred today  DTR: Patellar: deferred today Achilles: deferred today Plantar: deferred today  Palpation: No palpable anomalies  Palpation: No palpable anomalies     Assessment   Status Diagnosis  Controlled Controlled Controlled 1. Chronic pain syndrome   2. Diabetic polyneuropathy associated with type 2 diabetes mellitus ([place])   3. Chronic pain of both lower extremities (L>R)   4. Chronic bilateral low back pain with bilateral sciatica   5. Neurogenic pain   6. Lumbar radiculopathy   7. Lumbar spondylosis   8. Lumbosacral neuritis      Plan of Care  [name] has a current medication list which includes the following long-term medication(s): duloxetine, insulin glargine, insulin lispro, losartan, pravastatin, pregabalin, victoza, lidocaine, and [DISCONTINUED] pravastatin.  Continue Lyrica, Cymbalta as prescribed.  No refills needed.  Refill hydrocodone as below.  UDS up-to-date and appropriate.  Pharmacotherapy (Medications Ordered): Meds ordered this encounter  Medications   HYDROcodone-acetaminophen (NORCO) 10-325 MG tablet    Sig: Take 1 tablet by mouth every 6 (six) hours as needed for severe pain. For chronic pain    Dispense:  120 tablet    Refill:  0    Do not place this medication, or any other prescription from our practice, on "Automatic Refill". Patient may have prescription filled one day early if pharmacy is closed on scheduled refill date.   HYDROcodone-acetaminophen (NORCO) 10-325 MG tablet    Sig: Take 1 tablet by mouth every 6 (six) hours as needed for severe pain. For chronic pain    Dispense:  120 tablet    Refill:  0    Do not place this medication, or any other prescription from our practice, on "Automatic  Refill". Patient may have prescription filled one day early if pharmacy is closed on scheduled refill date.   HYDROcodone-acetaminophen (NORCO) 10-325 MG tablet    Sig: Take 1 tablet by mouth  every 6 (six) hours as needed for severe pain. For chronic pain    Dispense:  120 tablet    Refill:  0    Do not place this medication, or any other prescription from our practice, on "Automatic Refill". Patient may have prescription filled one day early if pharmacy is closed on scheduled refill date.   Orders:  No orders of the defined types were placed in this encounter.  Follow-up plan:   Return in about 3 months (around 05/27/2020) for Medication Management, in person.   Recent Visits No visits were found meeting these conditions. Showing recent visits within past 90 days and meeting all other requirements Today's Visits Date Type Provider Dept  02/26/20 Office Visit Gillis Santa, MD [facility]  Showing today's visits and meeting all other requirements Future Appointments No visits were found meeting these conditions. Showing future appointments within next 90 days and meeting all other requirements  I discussed the assessment and treatment plan with the patient. The patient was provided an opportunity to ask questions and all were answered. The patient agreed with the plan and demonstrated an understanding of the instructions.  Patient advised to call back or seek an in-person evaluation if the symptoms or condition worsens.  Duration of encounter: 30 minutes.  Note by: Gillis Santa, MD Date: 02/26/2020; Time: 8:45 AM

## 2020-02-26 NOTE — Progress Notes (Signed)
Nursing Pain Medication Assessment:  Safety precautions to be maintained throughout the outpatient stay will include: orient to surroundings, keep bed in low position, maintain call bell within reach at all times, provide assistance with transfer out of bed and ambulation.  Medication Inspection Compliance: Pill count conducted under aseptic conditions, in front of the patient. Neither the pills nor the bottle was removed from the patient's sight at any time. Once count was completed pills were immediately returned to the patient in their original bottle.  Medication: Hydrocodone/APAP Pill/Patch Count: 41 of 120 pills remain Pill/Patch Appearance: Markings consistent with prescribed medication Bottle Appearance: Standard pharmacy container. Clearly labeled. Filled Date: 08 / 25 / 21 Last Medication intake:  Today

## 2020-02-26 NOTE — Patient Instructions (Signed)
Rx for Hydrocodone to last until 06/05/20 has been escribed to your pharmacy.

## 2020-03-18 DIAGNOSIS — E119 Type 2 diabetes mellitus without complications: Secondary | ICD-10-CM | POA: Diagnosis not present

## 2020-04-06 ENCOUNTER — Other Ambulatory Visit: Payer: Self-pay | Admitting: Family Medicine

## 2020-05-09 ENCOUNTER — Other Ambulatory Visit: Payer: Self-pay | Admitting: Family Medicine

## 2020-05-25 ENCOUNTER — Other Ambulatory Visit: Payer: Self-pay | Admitting: Family Medicine

## 2020-05-27 ENCOUNTER — Other Ambulatory Visit: Payer: Self-pay

## 2020-05-27 ENCOUNTER — Ambulatory Visit
Payer: Medicare Other | Attending: Student in an Organized Health Care Education/Training Program | Admitting: Student in an Organized Health Care Education/Training Program

## 2020-05-27 ENCOUNTER — Encounter: Payer: Self-pay | Admitting: Student in an Organized Health Care Education/Training Program

## 2020-05-27 VITALS — BP 171/112 | HR 81 | Temp 98.4°F | Resp 20 | Ht 67.0 in | Wt 278.0 lb

## 2020-05-27 DIAGNOSIS — G894 Chronic pain syndrome: Secondary | ICD-10-CM

## 2020-05-27 DIAGNOSIS — M5441 Lumbago with sciatica, right side: Secondary | ICD-10-CM

## 2020-05-27 DIAGNOSIS — Z79891 Long term (current) use of opiate analgesic: Secondary | ICD-10-CM

## 2020-05-27 DIAGNOSIS — M5416 Radiculopathy, lumbar region: Secondary | ICD-10-CM | POA: Diagnosis not present

## 2020-05-27 DIAGNOSIS — G8929 Other chronic pain: Secondary | ICD-10-CM

## 2020-05-27 DIAGNOSIS — M5417 Radiculopathy, lumbosacral region: Secondary | ICD-10-CM | POA: Diagnosis not present

## 2020-05-27 DIAGNOSIS — M79605 Pain in left leg: Secondary | ICD-10-CM | POA: Diagnosis not present

## 2020-05-27 DIAGNOSIS — M79604 Pain in right leg: Secondary | ICD-10-CM

## 2020-05-27 DIAGNOSIS — M792 Neuralgia and neuritis, unspecified: Secondary | ICD-10-CM | POA: Diagnosis not present

## 2020-05-27 DIAGNOSIS — M47816 Spondylosis without myelopathy or radiculopathy, lumbar region: Secondary | ICD-10-CM

## 2020-05-27 DIAGNOSIS — E1142 Type 2 diabetes mellitus with diabetic polyneuropathy: Secondary | ICD-10-CM

## 2020-05-27 DIAGNOSIS — M5442 Lumbago with sciatica, left side: Secondary | ICD-10-CM

## 2020-05-27 MED ORDER — PREGABALIN 50 MG PO CAPS: 100.0000 mg | ORAL_CAPSULE | Freq: Two times a day (BID) | ORAL | 5 refills | Status: DC

## 2020-05-27 MED ORDER — HYDROCODONE-ACETAMINOPHEN 10-325 MG PO TABS: 1.0000 | ORAL_TABLET | Freq: Four times a day (QID) | ORAL | 0 refills | Status: AC | PRN

## 2020-05-27 MED ORDER — HYDROCODONE-ACETAMINOPHEN 10-325 MG PO TABS: 1.0000 | ORAL_TABLET | Freq: Four times a day (QID) | ORAL | 0 refills | Status: DC | PRN

## 2020-05-27 MED ORDER — DULOXETINE HCL 30 MG PO CPEP: 60.0000 mg | ORAL_CAPSULE | Freq: Every day | ORAL | 5 refills | Status: DC

## 2020-05-27 NOTE — Progress Notes (Signed)
PROVIDER NOTE: Information contained herein reflects review and annotations entered in association with encounter. Interpretation of such information and data should be left to medically-trained personnel. Information provided to patient can be located elsewhere in the medical record under "Patient Instructions". Document created using STT-dictation technology, any transcriptional errors that may result from process are unintentional.    Patient: [name]  Service Category: E/M  Provider: Gillis Santa, MD  DOB: [birth year]  DOS: 05/27/2020  Specialty: Interventional Pain Management  MRN: [id number]  Setting: Ambulatory outpatient  PCP: Delora Fuel, MD  Type: Established Patient    Referring Provider: Delora Fuel, MD  Location: Office  Delivery: Face-to-face     HPI  [name], a 54 y.o. year old male, is here today because of his Chronic pain syndrome [G89.4]. [name] primary complain today is Medication Refill Last encounter: My last encounter with him was on 02/26/2020. Pertinent problems: [name] has Chronic pain of both lower extremities (L>R); Chronic bilateral low back pain with bilateral sciatica; Chronic pain syndrome; Long term current use of opiate analgesic; Neurogenic pain; Morbid obesity ([place]); Lumbar radiculopathy; Diabetic polyneuropathy associated with type 2 diabetes mellitus ([place]); and History of lumbar laminectomy (left L4/5) on their pertinent problem list. Pain Assessment: Severity of Chronic pain is reported as a 5 /10. Location: Back Lower/Radiates from lower back to back of legs into the knees bilateral and feet bilateral. Onset: More than a month ago. Quality: Constant,Stabbing,Sharp,Pressure,Dull,Tingling,Burning. Timing: Constant. Modifying factor(s): Medicatons, keeping feet up and soaking in salt water. Vitals:  height is 5' 7"  (1.702 m) and weight is 278 lb (126.1 kg). His temporal temperature is 98.4 F (36.9 C). His blood pressure is 171/112  (abnormal) and his pulse is 81. His respiration is 20 and oxygen saturation is 98%.   Reason for encounter: medication management.   No change in medical history since last visit.  Patient's pain is at baseline.  Patient continues multimodal pain regimen as prescribed.  States that it provides pain relief and improvement in functional status. Patient is endorsing more burning and tingling in his feet.  Patient's most recent A1c in June was 7.2.  He is refraining from sodas, sugary drinks and sweets.  I commended him on this.  We discussed increasing his Lyrica to 100 mg twice a day to help out with his lower extremity neuropathic pain.  05/09/2020  02/26/2020   1  Hydrocodone-Acetamin 10-325 Mg  120.00  30  Bi Lat  [id number]  Nor (2372)  0/0  40.00 MME  Medicare  [place]      Pharmacotherapy Assessment   Analgesic: Hydrocodone 10 mg every 6 hours as needed, quantity 120/month; MME equals 40 Monitoring: [place] PMP: PDMP reviewed during this encounter.       Pharmacotherapy: No side-effects or adverse reactions reported. Compliance: No problems identified. Effectiveness: Clinically acceptable.  Janne Napoleon, RN  05/27/2020  9:19 AM  Sign when Signing Visit   Safety precautions to be maintained throughout the outpatient stay will include: orient to surroundings, keep bed in low position, maintain call bell within reach at all times, provide assistance with transfer out of bed and ambulation.   Nursing Pain Medication Assessment:  Safety precautions to be maintained throughout the outpatient stay will include: orient to surroundings, keep bed in low position, maintain call bell within reach at all times, provide assistance with transfer out of bed and ambulation.  Medication Inspection Compliance: Pill count conducted under aseptic conditions, in front of  the patient. Neither the pills nor the bottle was removed from the patient's sight at any time. Once count was completed pills were immediately  returned to the patient in their original bottle.  Medication: Hydrocodone/APAP Pill/Patch Count: 37 of 120 pills remain Pill/Patch Appearance: Markings consistent with prescribed medication Bottle Appearance: Standard pharmacy container. Clearly labeled. Filled Date: 28 / 26 / 2021 Last Medication intake:  Today    UDS:  Summary  Date Value Ref Range Status  11/26/2019 Note  Final    Comment:    ==================================================================== [facility] (MW) ==================================================================== Specimen Alert Note: Urinary creatinine is low; ability to detect some drugs may be compromised. Interpret results with caution. (Creatinine) ==================================================================== Test                             Result       Flag       Units  Drug Present and Declared for Prescription Verification   Hydrocodone                    2556         EXPECTED   ng/mg creat   Hydromorphone                  317          EXPECTED   ng/mg creat   Dihydrocodeine                 294          EXPECTED   ng/mg creat   Norhydrocodone                 1489         EXPECTED   ng/mg creat    Sources of hydrocodone include scheduled prescription medications.    Hydromorphone, dihydrocodeine and norhydrocodone are expected    metabolites of hydrocodone. Hydromorphone and dihydrocodeine are    also available as scheduled prescription medications.  ==================================================================== Test                      Result    Flag   Units      Ref Range   Creatinine              18        LL     mg/dL      >=20 ==================================================================== Declared Medications:  The flagging and interpretation on this report are based on the  following declared medications.  Unexpected results may arise from  inaccuracies in the declared medications.   **Note: The  testing scope of this panel includes these medications:   Hydrocodone   **Note: The testing scope of this panel does not include the  following reported medications:   Acetaminophen  Aspirin  Duloxetine  Insulin (Lantus)  Ketoconazole (Nizoral)  Liraglutide (Victoza)  Losartan (Cozaar)  Multivitamin  Pravastatin  Pregabalin (Lyrica)  Sildenafil  Topical Lidocaine  Triamcinolone (Kenalog) ==================================================================== For clinical consultation, please call [phone]. ====================================================================      ROS  Constitutional: Denies any fever or chills Gastrointestinal: No reported hemesis, hematochezia, vomiting, or acute GI distress Musculoskeletal: Denies any acute onset joint swelling, redness, loss of ROM, or weakness Neurological: Lower extremity neuropathic pain  Medication Review  DULoxetine, HYDROcodone-acetaminophen, SYRINGE-NEEDLE (DISP) 3 ML, aspirin, insulin glargine, insulin lispro, ketoconazole, lidocaine, liraglutide, losartan, multivitamin with minerals, pravastatin, pregabalin, sildenafil, and triamcinolone  History  Review  Allergy: Mr. Kottke is allergic to penicillins and metformin and related. Drug: Mr. Mattioli  reports no history of drug use. Alcohol:  reports current alcohol use. Tobacco:  reports that he quit smoking about 8 years ago. His smoking use included cigarettes. He has a 48.00 pack-year smoking history. He quit smokeless tobacco use about 8 years ago.  His smokeless tobacco use included snuff. Social: Mr. Corsino  reports that he quit smoking about 8 years ago. His smoking use included cigarettes. He has a 48.00 pack-year smoking history. He quit smokeless tobacco use about 8 years ago.  His smokeless tobacco use included snuff. He reports current alcohol use. He reports that he does not use drugs. Medical:  has a past medical history of Arthritis, Bilateral edema  of lower extremity, Bulge of cervical disc without myelopathy, Chronic low back pain, Disorder of subcutaneous tissue, Environmental allergies, History of cervical fracture, History of multiple concussions, History of pneumothorax, History of seizures (pt states takes gabapentin to control seizures and for neuropathy---  followed by PCP  dr  Lorenso Courier ([facility])  seizures are note mentioned as hx in her last note or previous notes), Hypertension, OBESITY, NOS (08/11/2006), OSA on CPAP, Peripheral neuropathy, Recurrent boils, Type 2 diabetes mellitus ([place]), and Wears glasses. Surgical: Mr. Mori  has a past surgical history that includes I & D RIGHT INDEX FINGER PURULENT FLEXOR TENOSYNOVITIS (08-23-2001); Lumbar disc surgery (02-08-2002); transthoracic echocardiogram (05-03-2007); REATTACHMENT LEFT INDEX FINGER (1999); Appendectomy (age 66); and Mass excision (N/A, 08/07/2015). Family: family history includes Asthma in his maternal aunt, maternal grandfather, maternal grandmother, maternal uncle, and mother; COPD in his mother; Diabetes in his maternal aunt, maternal grandfather, maternal grandmother, maternal uncle, and mother; Obesity in his maternal aunt, maternal grandfather, maternal grandmother, maternal uncle, and mother.  Laboratory Chemistry Profile   Renal Lab Results  Component Value Date   BUN 17 03/27/2019   CREATININE 0.78 03/27/2019   BCR 22 (H) 03/27/2019   GFRAA 122 03/27/2019   GFRNONAA 105 03/27/2019     Hepatic Lab Results  Component Value Date   AST 29 07/06/2018   ALT 25 03/25/2015   ALBUMIN 4.4 07/06/2018   ALKPHOS 82 07/06/2018   HCVAB NEG 04/02/2008   LIPASE 24 03/25/2015     Electrolytes Lab Results  Component Value Date   NA 133 (L) 03/27/2019   K 4.3 03/27/2019   CL 92 (L) 03/27/2019   CALCIUM 9.3 03/27/2019   MG 2.1 07/06/2018     Bone Lab Results  Component Value Date   25OHVITD1 40 07/06/2018   25OHVITD2 <1.0 07/06/2018   25OHVITD3 40  07/06/2018   TESTOSTERONE 174 (L) 05/16/2017     Inflammation (CRP: Acute Phase) (ESR: Chronic Phase) Lab Results  Component Value Date   CRP 7 03/27/2019   ESRSEDRATE 51 (H) 03/27/2019       Note: Above Lab results reviewed.  Recent Imaging Review  DG Foot Complete Left CLINICAL DATA:  Diabetic foot ulcer.  EXAM: LEFT FOOT - COMPLETE 3+ VIEW  COMPARISON:  No recent.  FINDINGS: Soft tissue swelling left great toe. No radiopaque foreign body. No underlying acute bony abnormality identified. No erosive abnormality identified. If osteomyelitis remains a clinical concern MRI can be obtained.  IMPRESSION: Soft tissue swelling left great toe. No underlying bony abnormality.  Electronically Signed   By: Marcello Moores  Register   On: 03/27/2019 12:35 Note: Reviewed        Physical Exam  General appearance: Well  nourished, well developed, and well hydrated. In no apparent acute distress Mental status: Alert, oriented x 3 (person, place, & time)       Respiratory: No evidence of acute respiratory distress Eyes: PERLA Vitals: BP (!) 171/112   Pulse 81   Temp 98.4 F (36.9 C) (Temporal)   Resp 20   Ht 5' 7"  (1.702 m)   Wt 278 lb (126.1 kg)   SpO2 98%   BMI 43.54 kg/m  BMI: Estimated body mass index is 43.54 kg/m as calculated from the following:   Height as of this encounter: 5' 7"  (1.702 m).   Weight as of this encounter: 278 lb (126.1 kg). Ideal: Ideal body weight: 66.1 kg (145 lb 11.6 oz) Adjusted ideal body weight: 90.1 kg (198 lb 10.2 oz)   Gait & Posture Assessment  Ambulation:Unassisted Gait:Antalgic Posture:WNL  Lower Extremity Exam    Side:Right lower extremity  Side:Left lower extremity  Stability:No instability observed  Stability:No instability observed  Skin & Extremity Inspection:Skin color, temperature, and hair growth are WNL. No peripheral edema or cyanosis. No masses, redness, swelling, asymmetry, or associated skin  lesions. No contractures.  Skin & Extremity Inspection:Skin color, temperature, and hair growth are WNL. No peripheral edema or cyanosis. No masses, redness, swelling, asymmetry, or associated skin lesions. No contractures.  Functional AXK:PVVZSMOLMBEM ROM   Functional LJQ:GBEEFEOFHQRF ROM   Muscle Tone/Strength:Functionally intact. No obvious neuro-muscular anomalies detected.  Muscle Tone/Strength:Functionally intact. No obvious neuro-muscular anomalies detected.  Sensory (Neurological):Neuropathic pain pattern  Sensory (Neurological):Neuropathic pain pattern  DTR: Patellar:deferred today Achilles:deferred today Plantar:deferred today  DTR: Patellar:deferred today Achilles:deferred today Plantar:deferred today  Palpation:No palpable anomalies  Palpation:No palpable anomalies      Assessment   Status Diagnosis  Controlled Controlled Controlled 1. Chronic pain syndrome   2. Diabetic polyneuropathy associated with type 2 diabetes mellitus ([place])   3. Chronic pain of both lower extremities (L>R)   4. Chronic bilateral low back pain with bilateral sciatica   5. Neurogenic pain   6. Lumbar radiculopathy   7. Lumbar spondylosis   8. Lumbosacral neuritis   9. Chronic use of opiate drug for therapeutic purpose   10. Long term current use of opiate analgesic       Plan of Care   Mr. MART COLPITTS has a current medication list which includes the following long-term medication(s): insulin glargine, duloxetine, insulin lispro, lidocaine, losartan, pravastatin, pregabalin, victoza, and [DISCONTINUED] pravastatin.  Pharmacotherapy (Medications Ordered): Meds ordered this encounter  Medications  . HYDROcodone-acetaminophen (NORCO) 10-325 MG tablet    Sig: Take 1 tablet by mouth every 6 (six) hours as needed for severe pain. For chronic pain    Dispense:  120 tablet    Refill:  0    Do not place this medication, or any  other prescription from our practice, on "Automatic Refill". Patient may have prescription filled one day early if pharmacy is closed on scheduled refill date.  Marland Kitchen HYDROcodone-acetaminophen (NORCO) 10-325 MG tablet    Sig: Take 1 tablet by mouth every 6 (six) hours as needed for severe pain. For chronic pain    Dispense:  120 tablet    Refill:  0    Do not place this medication, or any other prescription from our practice, on "Automatic Refill". Patient may have prescription filled one day early if pharmacy is closed on scheduled refill date.  Marland Kitchen HYDROcodone-acetaminophen (NORCO) 10-325 MG tablet    Sig: Take 1 tablet by mouth every 6 (six) hours as needed  for severe pain. For chronic pain    Dispense:  120 tablet    Refill:  0    Do not place this medication, or any other prescription from our practice, on "Automatic Refill". Patient may have prescription filled one day early if pharmacy is closed on scheduled refill date.  . pregabalin (LYRICA) 50 MG capsule    Sig: Take 2 capsules (100 mg total) by mouth 2 (two) times daily.    Dispense:  120 capsule    Refill:  5  . DULoxetine (CYMBALTA) 30 MG capsule    Sig: Take 2 capsules (60 mg total) by mouth daily.    Dispense:  60 capsule    Refill:  5   Follow-up plan:   Return in about 3 months (around 08/25/2020) for Medication Management, in person.   Recent Visits No visits were found meeting these conditions. Showing recent visits within past 90 days and meeting all other requirements Today's Visits Date Type Provider Dept  05/27/20 Office Visit Gillis Santa, MD [facility]  Showing today's visits and meeting all other requirements Future Appointments Date Type Provider Dept  08/21/20 Appointment Gillis Santa, MD [facility]  Showing future appointments within next 90 days and meeting all other requirements  I discussed the assessment and treatment plan with the patient. The patient was provided an opportunity to  ask questions and all were answered. The patient agreed with the plan and demonstrated an understanding of the instructions.  Patient advised to call back or seek an in-person evaluation if the symptoms or condition worsens.  Duration of encounter: 28mnutes.  Note by: BGillis Santa MD Date: 05/27/2020; Time: 9:33 AM

## 2020-05-27 NOTE — Patient Instructions (Signed)
____________________________________________________________________________________________  Medication Rules  Applies to: All patients receiving prescriptions (written or electronic).  Pharmacy of record: Pharmacy where electronic prescriptions will be sent. If written prescriptions are taken to a different pharmacy, please inform the nursing staff. The pharmacy listed in the electronic medical record should be the one where you would like electronic prescriptions to be sent.  Prescription refills: Only during scheduled appointments. Applies to both, written and electronic prescriptions.  NOTE: The following applies primarily to controlled substances (Opioid* Pain Medications).   Patient's responsibilities: 1. Pain Pills: Bring all pain pills to every appointment (except for procedure appointments). 2. Pill Bottles: Bring pills in original pharmacy bottle. Always bring newest bottle. Bring bottle, even if empty. 3. Medication refills: You are responsible for knowing and keeping track of what medications you need refilled. The day before your appointment, write a list of all prescriptions that need to be refilled. Bring that list to your appointment and give it to the admitting nurse. Prescriptions will be written only during appointments. If you forget a medication, it will not be "Called in", "Faxed", or "electronically sent". You will need to get another appointment to get these prescribed. 4. Prescription Accuracy: You are responsible for carefully inspecting your prescriptions before leaving our office. Have the discharge nurse carefully go over each prescription with you, before taking them home. Make sure that your name is accurately spelled, that your address is correct. Check the name and dose of your medication to make sure it is accurate. Check the number of pills, and the written instructions to make sure they are clear and accurate. Make sure that you are given enough medication to last  until your next medication refill appointment. 5. Taking Medication: Take medication as prescribed. Never take more pills than instructed. Never take medication more frequently than prescribed. Taking less pills or less frequently is permitted and encouraged, when it comes to controlled substances (written prescriptions).  6. Inform other Doctors: Always inform, all of your healthcare providers, of all the medications you take. 7. Pain Medication from other Providers: You are not allowed to accept any additional pain medication from any other Doctor or Healthcare provider. There are two exceptions to this rule. (see below) In the event that you require additional pain medication, you are responsible for notifying us, as stated below. 8. Medication Agreement: You are responsible for carefully reading and following our Medication Agreement. This must be signed before receiving any prescriptions from our practice. Safely store a copy of your signed Agreement. Violations to the Agreement will result in no further prescriptions. (Additional copies of our Medication Agreement are available upon request.) 9. Laws, Rules, & Regulations: All patients are expected to follow all Federal and State Laws, Statutes, Rules, & Regulations. Ignorance of the Laws does not constitute a valid excuse. The use of any illegal substances is prohibited. 10. Adopted CDC guidelines & recommendations: Target dosing levels will be at or below 60 MME/day. Use of benzodiazepines** is not recommended.  Exceptions: There are only two exceptions to the rule of not receiving pain medications from other Healthcare Providers. 1. Exception #1 (Emergencies): In the event of an emergency (i.e.: accident requiring emergency care), you are allowed to receive additional pain medication. However, you are responsible for: As soon as you are able, call our office [phone], at any time of the day or night, and leave a message stating your name, the  date and nature of the emergency, and the name and dose of the medication   prescribed. In the event that your call is answered by a member of our staff, make sure to document and save the date, time, and the name of the person that took your information.  2. Exception #2 (Planned Surgery): In the event that you are scheduled by another doctor or dentist to have any type of surgery or procedure, you are allowed (for a period no longer than 30 days), to receive additional pain medication, for the acute post-op pain. However, in this case, you are responsible for picking up a copy of our "Post-op Pain Management for Surgeons" handout, and giving it to your surgeon or dentist. This document is available at our office, and does not require an appointment to obtain it. Simply go to our office during business hours (Monday-Thursday from 8:00 AM to 4:00 PM) (Friday 8:00 AM to 12:00 Noon) or if you have a scheduled appointment with us, prior to your surgery, and ask for it by name. In addition, you will need to provide us with your name, name of your surgeon, type of surgery, and date of procedure or surgery.  *Opioid medications include: morphine, codeine, oxycodone, oxymorphone, hydrocodone, hydromorphone, meperidine, tramadol, tapentadol, buprenorphine, fentanyl, methadone. **Benzodiazepine medications include: diazepam (Valium), alprazolam (Xanax), clonazepam (Klonopine), lorazepam (Ativan), clorazepate (Tranxene), chlordiazepoxide (Librium), estazolam (Prosom), oxazepam (Serax), temazepam (Restoril), triazolam (Halcion) (Last updated: 08/11/2017) ____________________________________________________________________________________________    

## 2020-05-27 NOTE — Progress Notes (Signed)
   Safety precautions to be maintained throughout the outpatient stay will include: orient to surroundings, keep bed in low position, maintain call bell within reach at all times, provide assistance with transfer out of bed and ambulation.   Nursing Pain Medication Assessment:  Safety precautions to be maintained throughout the outpatient stay will include: orient to surroundings, keep bed in low position, maintain call bell within reach at all times, provide assistance with transfer out of bed and ambulation.  Medication Inspection Compliance: Pill count conducted under aseptic conditions, in front of the patient. Neither the pills nor the bottle was removed from the patient's sight at any time. Once count was completed pills were immediately returned to the patient in their original bottle.  Medication: Hydrocodone/APAP Pill/Patch Count: 37 of 120 pills remain Pill/Patch Appearance: Markings consistent with prescribed medication Bottle Appearance: Standard pharmacy container. Clearly labeled. Filled Date: 78 / 26 / 2021 Last Medication intake:  Today

## 2020-06-21 DIAGNOSIS — E119 Type 2 diabetes mellitus without complications: Secondary | ICD-10-CM | POA: Diagnosis not present

## 2020-08-13 ENCOUNTER — Other Ambulatory Visit: Payer: Self-pay | Admitting: Family Medicine

## 2020-08-13 DIAGNOSIS — E1142 Type 2 diabetes mellitus with diabetic polyneuropathy: Secondary | ICD-10-CM

## 2020-08-13 DIAGNOSIS — L219 Seborrheic dermatitis, unspecified: Secondary | ICD-10-CM

## 2020-08-21 ENCOUNTER — Encounter: Payer: Self-pay | Admitting: Student in an Organized Health Care Education/Training Program

## 2020-08-21 ENCOUNTER — Other Ambulatory Visit: Payer: Self-pay

## 2020-08-21 ENCOUNTER — Ambulatory Visit
Payer: Medicare Other | Attending: Student in an Organized Health Care Education/Training Program | Admitting: Student in an Organized Health Care Education/Training Program

## 2020-08-21 VITALS — BP 151/113 | HR 101 | Temp 97.1°F | Resp 16 | Ht 67.0 in | Wt 280.0 lb

## 2020-08-21 DIAGNOSIS — I10 Essential (primary) hypertension: Secondary | ICD-10-CM

## 2020-08-21 DIAGNOSIS — M5441 Lumbago with sciatica, right side: Secondary | ICD-10-CM | POA: Diagnosis not present

## 2020-08-21 DIAGNOSIS — M5442 Lumbago with sciatica, left side: Secondary | ICD-10-CM | POA: Diagnosis not present

## 2020-08-21 DIAGNOSIS — M79605 Pain in left leg: Secondary | ICD-10-CM | POA: Diagnosis not present

## 2020-08-21 DIAGNOSIS — G894 Chronic pain syndrome: Secondary | ICD-10-CM | POA: Insufficient documentation

## 2020-08-21 DIAGNOSIS — G8929 Other chronic pain: Secondary | ICD-10-CM | POA: Insufficient documentation

## 2020-08-21 DIAGNOSIS — M79604 Pain in right leg: Secondary | ICD-10-CM | POA: Insufficient documentation

## 2020-08-21 DIAGNOSIS — E1142 Type 2 diabetes mellitus with diabetic polyneuropathy: Secondary | ICD-10-CM | POA: Insufficient documentation

## 2020-08-21 MED ORDER — HYDROCODONE-ACETAMINOPHEN 10-325 MG PO TABS: 1.0000 | ORAL_TABLET | Freq: Four times a day (QID) | ORAL | 0 refills | Status: AC | PRN

## 2020-08-21 MED ORDER — PREGABALIN 50 MG PO CAPS: 100.0000 mg | ORAL_CAPSULE | Freq: Two times a day (BID) | ORAL | 5 refills | Status: AC

## 2020-08-21 MED ORDER — LIDOCAINE 5 % EX OINT: 1.0000 "application " | TOPICAL_OINTMENT | Freq: Four times a day (QID) | CUTANEOUS | 3 refills | Status: DC | PRN

## 2020-08-21 MED ORDER — LOSARTAN POTASSIUM 50 MG PO TABS: 50.0000 mg | ORAL_TABLET | Freq: Every day | ORAL | 3 refills | Status: DC

## 2020-08-21 NOTE — Progress Notes (Signed)
Nursing Pain Medication Assessment:  Safety precautions to be maintained throughout the outpatient stay will include: orient to surroundings, keep bed in low position, maintain call bell within reach at all times, provide assistance with transfer out of bed and ambulation.  Medication Inspection Compliance: Pill count conducted under aseptic conditions, in front of the patient. Neither the pills nor the bottle was removed from the patient's sight at any time. Once count was completed pills were immediately returned to the patient in their original bottle.  Medication: Hydrocodone/APAP Pill/Patch Count: 89 of 120 pills remain Pill/Patch Appearance: Markings consistent with prescribed medication Bottle Appearance: Standard pharmacy container. Clearly labeled. Filled Date: 03 / 02 / 2022 Last Medication intake:  Today

## 2020-08-21 NOTE — Progress Notes (Signed)
PROVIDER NOTE: Information contained herein reflects review and annotations entered in association with encounter. Interpretation of such information and data should be left to medically-trained personnel. Information provided to patient can be located elsewhere in the medical record under "Patient Instructions". Document created using STT-dictation technology, any transcriptional errors that may result from process are unintentional.    Patient: [name]  Service Category: E/M  Provider: Gillis Santa, MD  DOB: 11-Dec-1965  DOS: 08/21/2020  Specialty: Interventional Pain Management  MRN: [id number]  Setting: Ambulatory outpatient  PCP: Delora Fuel, MD  Type: Established Patient    Referring Provider: Delora Fuel, MD  Location: Office  Delivery: Face-to-face     HPI  [name], a 55 y.o. year old male, is here today because of his Chronic pain of both lower extremities [M79.604, M79.605, G89.29]. Mr. South primary complain today is Back Pain (Lumbar mainly on the left ), Leg Pain (Bilateral ), and Foot Pain (Bilateral ) Last encounter: My last encounter with him was on 05/27/2020. Pertinent problems: [name] has Chronic pain of both lower extremities (L>R); Chronic bilateral low back pain with bilateral sciatica; Chronic pain syndrome; Long term current use of opiate analgesic; Neurogenic pain; Morbid obesity ([place]); Lumbar radiculopathy; Diabetic polyneuropathy associated with type 2 diabetes mellitus ([place]); and History of lumbar laminectomy (left L4/5) on their pertinent problem list. Pain Assessment: Severity of Chronic pain is reported as a 5 /10. Location: Groin Lower,Left (see visit info for additional sites.)/back pain into hips. Onset: More than a month ago. Quality: Discomfort,Constant,Stabbing,Sharp. Timing: Constant. Modifying factor(s): tries a little bit of everything, compression devices, medicines, topicals. Vitals:  height is 5' 7"  (1.702 m) and weight is 280 lb  (127 kg). His temporal temperature is 97.1 F (36.2 C) (abnormal). His blood pressure is 151/113 (abnormal) and his pulse is 101 (abnormal). His respiration is 16 and oxygen saturation is 100%.   Reason for encounter: medication management.    No change in medical history since last visit.  Patient's pain is at baseline.  Patient continues multimodal pain regimen as prescribed.  States that it provides pain relief and improvement in functional status.   Pharmacotherapy Assessment   Analgesic: Hydrocodone 10 mg 4 times daily as needed, quantity 120/month, MME equals 40  Monitoring: [place] PMP: PDMP reviewed during this encounter.       Pharmacotherapy: No side-effects or adverse reactions reported. Compliance: No problems identified. Effectiveness: Clinically acceptable.  Janett Billow, RN  08/21/2020  8:32 AM  Sign when Signing Visit Nursing Pain Medication Assessment:  Safety precautions to be maintained throughout the outpatient stay will include: orient to surroundings, keep bed in low position, maintain call bell within reach at all times, provide assistance with transfer out of bed and ambulation.  Medication Inspection Compliance: Pill count conducted under aseptic conditions, in front of the patient. Neither the pills nor the bottle was removed from the patient's sight at any time. Once count was completed pills were immediately returned to the patient in their original bottle.  Medication: Hydrocodone/APAP Pill/Patch Count: 89 of 120 pills remain Pill/Patch Appearance: Markings consistent with prescribed medication Bottle Appearance: Standard pharmacy container. Clearly labeled. Filled Date: 03 / 02 / 2022 Last Medication intake:  Today    UDS:  Summary  Date Value Ref Range Status  11/26/2019 Note  Final    Comment:    ==================================================================== ToxASSURE Select 13  (MW) ==================================================================== Specimen Alert Note: Urinary creatinine is low; ability to detect some drugs  may be compromised. Interpret results with caution. (Creatinine) ==================================================================== Test                             Result       Flag       Units  Drug Present and Declared for Prescription Verification   Hydrocodone                    2556         EXPECTED   ng/mg creat   Hydromorphone                  317          EXPECTED   ng/mg creat   Dihydrocodeine                 294          EXPECTED   ng/mg creat   Norhydrocodone                 1489         EXPECTED   ng/mg creat    Sources of hydrocodone include scheduled prescription medications.    Hydromorphone, dihydrocodeine and norhydrocodone are expected    metabolites of hydrocodone. Hydromorphone and dihydrocodeine are    also available as scheduled prescription medications.  ==================================================================== Test                      Result    Flag   Units      Ref Range   Creatinine              18        LL     mg/dL      >=20 ==================================================================== Declared Medications:  The flagging and interpretation on this report are based on the  following declared medications.  Unexpected results may arise from  inaccuracies in the declared medications.   **Note: The testing scope of this panel includes these medications:   Hydrocodone   **Note: The testing scope of this panel does not include the  following reported medications:   Acetaminophen  Aspirin  Duloxetine  Insulin (Lantus)  Ketoconazole (Nizoral)  Liraglutide (Victoza)  Losartan (Cozaar)  Multivitamin  Pravastatin  Pregabalin (Lyrica)  Sildenafil  Topical Lidocaine  Triamcinolone (Kenalog) ==================================================================== For clinical consultation,  please call [phone]. ====================================================================      ROS  Constitutional: Denies any fever or chills Gastrointestinal: No reported hemesis, hematochezia, vomiting, or acute GI distress Musculoskeletal: Denies any acute onset joint swelling, redness, loss of ROM, or weakness Neurological: Lower extremity numbness and tingling  Medication Review  DULoxetine, HYDROcodone-acetaminophen, SYRINGE-NEEDLE (DISP) 3 ML, aspirin, insulin glargine, insulin lispro, ketoconazole, lidocaine, liraglutide, losartan, multivitamin with minerals, pravastatin, pregabalin, sildenafil, and triamcinolone  History Review  Allergy: [name] is allergic to penicillins and metformin and related. Drug: [name]  reports no history of drug use. Alcohol:  reports current alcohol use. Tobacco:  reports that he quit smoking about 8 years ago. His smoking use included cigarettes. He has a 48.00 pack-year smoking history. He quit smokeless tobacco use about 9 years ago.  His smokeless tobacco use included snuff. Social: [name]  reports that he quit smoking about 8 years ago. His smoking use included cigarettes. He has a 48.00 pack-year smoking history. He quit smokeless tobacco use about 9 years ago.  His smokeless tobacco use included snuff. He  reports current alcohol use. He reports that he does not use drugs. Medical:  has a past medical history of Arthritis, Bilateral edema of lower extremity, Bulge of cervical disc without myelopathy, Chronic low back pain, Disorder of subcutaneous tissue, Environmental allergies, History of cervical fracture, History of multiple concussions, History of pneumothorax, History of seizures (pt states takes gabapentin to control seizures and for neuropathy---  followed by PCP  dr  Lorenso Courier ([facility])  seizures are note mentioned as hx in her last note or previous notes), Hypertension, OBESITY, NOS (08/11/2006), OSA on CPAP,  Peripheral neuropathy, Recurrent boils, Type 2 diabetes mellitus ([place]), and Wears glasses. Surgical: [name]  has a past surgical history that includes I & D RIGHT INDEX FINGER PURULENT FLEXOR TENOSYNOVITIS (08-23-2001); Lumbar disc surgery (02-08-2002); transthoracic echocardiogram (05-03-2007); REATTACHMENT LEFT INDEX FINGER (1999); Appendectomy (age 33); and Mass excision (N/A, 08/07/2015). Family: family history includes Asthma in his maternal aunt, maternal grandfather, maternal grandmother, maternal uncle, and mother; COPD in his mother; Diabetes in his maternal aunt, maternal grandfather, maternal grandmother, maternal uncle, and mother; Obesity in his maternal aunt, maternal grandfather, maternal grandmother, maternal uncle, and mother.  Laboratory Chemistry Profile   Renal Lab Results  Component Value Date   BUN 17 03/27/2019   CREATININE 0.78 03/27/2019   BCR 22 (H) 03/27/2019   GFRAA 122 03/27/2019   GFRNONAA 105 03/27/2019     Hepatic Lab Results  Component Value Date   AST 29 07/06/2018   ALT 25 03/25/2015   ALBUMIN 4.4 07/06/2018   ALKPHOS 82 07/06/2018   HCVAB NEG 04/02/2008   LIPASE 24 03/25/2015     Electrolytes Lab Results  Component Value Date   NA 133 (L) 03/27/2019   K 4.3 03/27/2019   CL 92 (L) 03/27/2019   CALCIUM 9.3 03/27/2019   MG 2.1 07/06/2018     Bone Lab Results  Component Value Date   25OHVITD1 40 07/06/2018   25OHVITD2 <1.0 07/06/2018   25OHVITD3 40 07/06/2018   TESTOSTERONE 174 (L) 05/16/2017     Inflammation (CRP: Acute Phase) (ESR: Chronic Phase) Lab Results  Component Value Date   CRP 7 03/27/2019   ESRSEDRATE 51 (H) 03/27/2019       Note: Above Lab results reviewed.  Recent Imaging Review  DG Foot Complete Left CLINICAL DATA:  Diabetic foot ulcer.  EXAM: LEFT FOOT - COMPLETE 3+ VIEW  COMPARISON:  No recent.  FINDINGS: Soft tissue swelling left great toe. No radiopaque foreign body. No underlying acute bony  abnormality identified. No erosive abnormality identified. If osteomyelitis remains a clinical concern MRI can be obtained.  IMPRESSION: Soft tissue swelling left great toe. No underlying bony abnormality.  Electronically Signed   By: Marcello Moores  Register   On: 03/27/2019 12:35 Note: Reviewed        Physical Exam  General appearance: Well nourished, well developed, and well hydrated. In no apparent acute distress Mental status: Alert, oriented x 3 (person, place, & time)       Respiratory: No evidence of acute respiratory distress Eyes: PERLA Vitals: BP (!) 151/113 (BP Location: Left Arm, Patient Position: Sitting, Cuff Size: Large)   Pulse (!) 101   Temp (!) 97.1 F (36.2 C) (Temporal)   Resp 16   Ht 5' 7"  (1.702 m)   Wt 280 lb (127 kg)   SpO2 100%   BMI 43.85 kg/m  BMI: Estimated body mass index is 43.85 kg/m as calculated from the following:   Height as of  this encounter: 5' 7"  (1.702 m).   Weight as of this encounter: 280 lb (127 kg). Ideal: Ideal body weight: 66.1 kg (145 lb 11.6 oz) Adjusted ideal body weight: 90.5 kg (199 lb 6.9 oz)   Gait & Posture Assessment  Ambulation:Unassisted Gait:Antalgic Posture:WNL  Lower Extremity Exam    Side:Right lower extremity  Side:Left lower extremity  Stability:No instability observed  Stability:No instability observed  Skin & Extremity Inspection:Skin color, temperature, and hair growth are WNL. No peripheral edema or cyanosis. No masses, redness, swelling, asymmetry, or associated skin lesions. No contractures.  Skin & Extremity Inspection:Skin color, temperature, and hair growth are WNL. No peripheral edema or cyanosis. No masses, redness, swelling, asymmetry, or associated skin lesions. No contractures.  Functional VXB:[id number] ROM   Functional QTM:[id number] ROM   Muscle Tone/Strength:Functionally intact. No obvious neuro-muscular anomalies detected.   Muscle Tone/Strength:Functionally intact. No obvious neuro-muscular anomalies detected.  Sensory (Neurological):Neuropathic pain pattern  Sensory (Neurological):Neuropathic pain pattern  DTR: Patellar:deferred today Achilles:deferred today Plantar:deferred today  DTR: Patellar:deferred today Achilles:deferred today Plantar:deferred today  Palpation:No palpable anomalies  Palpation:No palpable anomalies    Assessment   Status Diagnosis  Controlled Controlled Controlled 1. Chronic pain of both lower extremities (L>R)   2. Chronic pain syndrome   3. Diabetic polyneuropathy associated with type 2 diabetes mellitus ([place])   4. Chronic bilateral low back pain with bilateral sciatica      Plan of Care   [name] has a current medication list which includes the following long-term medication(s): duloxetine, insulin glargine, insulin lispro, losartan, pravastatin, victoza, lidocaine, and pregabalin.  Pharmacotherapy (Medications Ordered): Meds ordered this encounter  Medications  . HYDROcodone-acetaminophen (NORCO) 10-325 MG tablet    Sig: Take 1 tablet by mouth every 6 (six) hours as needed for severe pain. For chronic pain    Dispense:  120 tablet    Refill:  0    Do not place this medication, or any other prescription from our practice, on "Automatic Refill". Patient may have prescription filled one day early if pharmacy is closed on scheduled refill date.  Marland Kitchen HYDROcodone-acetaminophen (NORCO) 10-325 MG tablet    Sig: Take 1 tablet by mouth every 6 (six) hours as needed for severe pain. For chronic pain    Dispense:  120 tablet    Refill:  0    Do not place this medication, or any other prescription from our practice, on "Automatic Refill". Patient may have prescription filled one day early if pharmacy is closed on scheduled refill date.  Marland Kitchen HYDROcodone-acetaminophen (NORCO) 10-325 MG tablet    Sig: Take 1 tablet by mouth every 6 (six) hours as  needed for severe pain. For chronic pain    Dispense:  120 tablet    Refill:  0    Do not place this medication, or any other prescription from our practice, on "Automatic Refill". Patient may have prescription filled one day early if pharmacy is closed on scheduled refill date.  . pregabalin (LYRICA) 50 MG capsule    Sig: Take 2 capsules (100 mg total) by mouth 2 (two) times daily.    Dispense:  120 capsule    Refill:  5  . lidocaine (XYLOCAINE) 5 % ointment    Sig: Apply 1 application topically 4 (four) times daily as needed.    Dispense:  50 g    Refill:  3    Pt requesting jar   Continue Cymbalta as prescribed.  No refills needed.  Orders:  Orders Placed  This Encounter  Procedures  . ToxASSURE Select 13 (MW), Urine    Volume: 30 ml(s). Minimum 3 ml of urine is needed. Document temperature of fresh sample. Indications: Long term (current) use of opiate analgesic [phone])    Order Specific Question:   Release to patient    Answer:   Immediate   Follow-up plan:   Return in about 3 months (around 11/21/2020) for Medication Management, in person.   Recent Visits Date Type Provider Dept  05/27/20 Office Visit Gillis Santa, MD [facility]  Showing recent visits within past 90 days and meeting all other requirements Today's Visits Date Type Provider Dept  08/21/20 Office Visit Gillis Santa, MD [facility]  Showing today's visits and meeting all other requirements Future Appointments Date Type Provider Dept  11/18/20 Appointment Gillis Santa, MD [facility]  Showing future appointments within next 90 days and meeting all other requirements  I discussed the assessment and treatment plan with the patient. The patient was provided an opportunity to ask questions and all were answered. The patient agreed with the plan and demonstrated an understanding of the instructions.  Patient advised to call back or seek an in-person evaluation if the symptoms  or condition worsens.  Duration of encounter:30 minutes.  Note by: Gillis Santa, MD Date: 08/21/2020; Time: 9:35 AM

## 2020-08-21 NOTE — Telephone Encounter (Signed)
Patient calls nurse line requesting losartan rx. Scheduled patient follow up appointment with PCP on 3/23.  Veronda Prude, RN

## 2020-08-25 ENCOUNTER — Other Ambulatory Visit: Payer: Self-pay | Admitting: Family Medicine

## 2020-08-25 DIAGNOSIS — I10 Essential (primary) hypertension: Secondary | ICD-10-CM

## 2020-08-25 MED ORDER — VALSARTAN 80 MG PO TABS: 80.0000 mg | ORAL_TABLET | Freq: Every day | ORAL | 0 refills | Status: DC

## 2020-08-27 ENCOUNTER — Ambulatory Visit: Payer: Medicare Other | Admitting: Family Medicine

## 2020-08-27 LAB — TOXASSURE SELECT 13 (MW), URINE

## 2020-09-01 ENCOUNTER — Telehealth: Payer: Self-pay | Admitting: *Deleted

## 2020-09-01 ENCOUNTER — Encounter: Payer: Self-pay | Admitting: Student in an Organized Health Care Education/Training Program

## 2020-09-01 NOTE — Telephone Encounter (Signed)
Per Dr. Garnett Farm order, I called the pharmacy and cancelled ALL 3 remaining hydrocodone scripts. Spoke with Wyline Beady, the pharmacist.

## 2020-09-01 NOTE — Progress Notes (Signed)
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  We will be canceling all of his hydrocodone prescriptions for the pharmacy as this is a serious violation of our pain contract.  Patient continues Cymbalta as well as Lyrica which are nonopioid medications at the patient's primary care provider to manage.  We will also send the patient a letter with a copy of this urine toxicology results along with resources for addiction medicine, rehab.    Pharmacotherapy Assessment     UDS:  Summary  Date Value Ref Range Status  08/21/2020 Note  Final    Comment:    ==================================================================== ToxASSURE Select 13 (MW) ==================================================================== Test                             Result       Flag       Units  Drug Present and Declared for Prescription Verification   Hydrocodone                    3777         EXPECTED   ng/mg creat   Hydromorphone                  565          EXPECTED   ng/mg creat   Dihydrocodeine                 284          EXPECTED   ng/mg creat   Norhydrocodone                 2384         EXPECTED   ng/mg creat    Sources of hydrocodone include scheduled prescription medications.    Hydromorphone, dihydrocodeine and norhydrocodone are expected    metabolites of hydrocodone. Hydromorphone and dihydrocodeine are    also available as scheduled prescription medications.  Drug Present not Declared for Prescription Verification   Methamphetamine                248          UNEXPECTED ng/mg creat    Sources of methamphetamine include illicit sources, as a scheduled    prescription medication, as a metabolite of some prescription drugs,    or use of an l-methamphetamine inhaler.  ==================================================================== Test                      Result    Flag   Units      Ref Range   Creatinine              31               mg/dL       >=93 ==================================================================== Declared Medications:  The flagging and interpretation on this report are based on the  following declared medications.  Unexpected results may arise from  inaccuracies in the declared medications.   **Note: The testing scope of this panel includes these medications:   Hydrocodone (Norco)   **Note: The testing scope of this panel does not include the  following reported medications:   Acetaminophen (Norco)  Aspirin  Duloxetine (Cymbalta)  Insulin (Lantus)  Iron  Ketoconazole (Nizoral)  Lidocaine (Xylocaine)  Liraglutide (Victoza)  Losartan (Cozaar)  Multivitamin  Pravastatin (Pravachol)  Pregabalin (Lyrica)  Sildenafil  Triamcinolone (Kenalog) ==================================================================== For clinical consultation, please call [phone]. ====================================================================

## 2020-09-03 ENCOUNTER — Ambulatory Visit: Payer: Medicare Other | Admitting: Family Medicine

## 2020-09-05 ENCOUNTER — Other Ambulatory Visit: Payer: Self-pay | Admitting: Family Medicine

## 2020-09-05 DIAGNOSIS — L219 Seborrheic dermatitis, unspecified: Secondary | ICD-10-CM

## 2020-09-19 DIAGNOSIS — E119 Type 2 diabetes mellitus without complications: Secondary | ICD-10-CM | POA: Diagnosis not present

## 2020-10-06 ENCOUNTER — Ambulatory Visit (INDEPENDENT_AMBULATORY_CARE_PROVIDER_SITE_OTHER): Payer: Medicare Other | Admitting: Family Medicine

## 2020-10-06 ENCOUNTER — Other Ambulatory Visit: Payer: Self-pay

## 2020-10-06 ENCOUNTER — Encounter: Payer: Self-pay | Admitting: Family Medicine

## 2020-10-06 VITALS — BP 144/89 | HR 100 | Wt 260.0 lb

## 2020-10-06 DIAGNOSIS — F411 Generalized anxiety disorder: Secondary | ICD-10-CM | POA: Insufficient documentation

## 2020-10-06 DIAGNOSIS — I152 Hypertension secondary to endocrine disorders: Secondary | ICD-10-CM | POA: Diagnosis not present

## 2020-10-06 DIAGNOSIS — E1159 Type 2 diabetes mellitus with other circulatory complications: Secondary | ICD-10-CM | POA: Diagnosis not present

## 2020-10-06 DIAGNOSIS — E1142 Type 2 diabetes mellitus with diabetic polyneuropathy: Secondary | ICD-10-CM | POA: Diagnosis not present

## 2020-10-06 HISTORY — DX: Generalized anxiety disorder: F41.1

## 2020-10-06 LAB — POCT GLYCOSYLATED HEMOGLOBIN (HGB A1C): HbA1c, POC (controlled diabetic range): 8.2 % — AB (ref 0.0–7.0)

## 2020-10-06 MED ORDER — LIRAGLUTIDE 18 MG/3ML ~~LOC~~ SOPN: 1.8000 mg | PEN_INJECTOR | Freq: Every day | SUBCUTANEOUS | 3 refills | Status: DC

## 2020-10-06 MED ORDER — HYDROCHLOROTHIAZIDE 12.5 MG PO TABS: 12.5000 mg | ORAL_TABLET | Freq: Every day | ORAL | 0 refills | Status: DC

## 2020-10-06 NOTE — Assessment & Plan Note (Signed)
BP today elevated at 144/89.  Currently on Valsartan 80 mg.  Complains of swelling in legs and indicated was taking HCTZ previously that helped with this - Continue Valsartan - Start HCTZ 12.5 mg - Will obtain BMP today - follow up in 1 month

## 2020-10-06 NOTE — Assessment & Plan Note (Signed)
Patient also complains of difficulty sleeping, overthinking things, difficulty concentrating, and unable to control worrying that's has been going on for a long time. asking for referral to psychiatrist Dr Evelene Croon for managing Anxiety. - Placed referral for Psychiatry

## 2020-10-06 NOTE — Assessment & Plan Note (Signed)
A1C elevated today at 8.2.  Recommend more home cooked meals and daily excericse.  Patient and wife both ammenable to this plan.  Currently on Jardiance 1.2 daily and Lantus 50U in the morning and Novolog 20U with every meal.  - Continue current insulin regimen, will conside increasing in future - Increase Jardiance to 1.8mg  daily

## 2020-10-06 NOTE — Progress Notes (Signed)
    SUBJECTIVE:   CHIEF COMPLAINT / HPI: Blood Pressure/Diabetes folow-up  Patient indicates he checks blood sugars at home and have noticed they have been elevated. Indicates they have been up to 200's in the morning and before eating and range from 200-500 after meals.  Takes Jardiance 1.2 daily and Lantus 50U in the morning and Novolog 20U with every meal.  Also currently taking Valsartan 80 mg.  Indicates has been eating fast food frequently but orders healthier items such as grilled chicken and currently drinking lots of water.  Patient also complains of difficulty sleeping, overthinking things, difficulty concentrating, and unable to control worrying that's has been going on for a long time. asking for referral to psychiatrist Dr Evelene Croon for managing Anxiety.     PERTINENT  PMH / PSH: Hypertension, Type 2 Diabetes  OBJECTIVE:   BP (!) 144/89   Pulse 100   Wt 260 lb (117.9 kg)   SpO2 95%   BMI 40.72 kg/m    Physical Exam Constitutional:      Appearance: Normal appearance.  HENT:     Mouth/Throat:     Mouth: Mucous membranes are moist.  Cardiovascular:     Rate and Rhythm: Normal rate and regular rhythm.     Pulses: Normal pulses.  Pulmonary:     Effort: Pulmonary effort is normal.     Breath sounds: Normal breath sounds.  Skin:    General: Skin is warm.  Neurological:     Mental Status: He is alert.     ASSESSMENT/PLAN:   Hypertension associated with diabetes (HCC) BP today elevated at 144/89.  Currently on Valsartan 80 mg.  Complains of swelling in legs and indicated was taking HCTZ previously that helped with this - Continue Valsartan - Start HCTZ 12.5 mg - Will obtain BMP today - follow up in 1 month  Generalized anxiety disorder Patient also complains of difficulty sleeping, overthinking things, difficulty concentrating, and unable to control worrying that's has been going on for a long time. asking for referral to psychiatrist Dr Evelene Croon for managing Anxiety. -  Placed referral for Psychiatry  Type 2 diabetes mellitus with neurologic complication A1C elevated today at 8.2.  Recommend more home cooked meals and daily excericse.  Patient and wife both ammenable to this plan.  Currently on Jardiance 1.2 daily and Lantus 50U in the morning and Novolog 20U with every meal.  - Continue current insulin regimen, will conside increasing in future - Increase Jardiance to 1.8mg  daily     Jovita Kussmaul, MD [facility]

## 2020-10-06 NOTE — Patient Instructions (Addendum)
It was good to see you today.  Thank you for coming in.  You A1C today was 8.2.    I am increasing you Victoza dose to 1.8.  I am also starting you on HCTZ for your blood pressure and to help with the fluid build up in your legs.  I am also putting in a referral to Psychiatry given your anxiety.  I also placed a referral to Psychiatry at your request.  Be Well, Dr Pecola Leisure

## 2020-10-07 LAB — BASIC METABOLIC PANEL
BUN/Creatinine Ratio: 16 (ref 9–20)
BUN: 14 mg/dL (ref 6–24)
CO2: 23 mmol/L (ref 20–29)
Calcium: 9.4 mg/dL (ref 8.7–10.2)
Chloride: 96 mmol/L (ref 96–106)
Creatinine, Ser: 0.86 mg/dL (ref 0.76–1.27)
Glucose: 191 mg/dL — ABNORMAL HIGH (ref 65–99)
Potassium: 4.8 mmol/L (ref 3.5–5.2)
Sodium: 140 mmol/L (ref 134–144)
eGFR: 104 mL/min/{1.73_m2} (ref >59)

## 2020-10-21 DIAGNOSIS — G4733 Obstructive sleep apnea (adult) (pediatric): Secondary | ICD-10-CM | POA: Diagnosis not present

## 2020-11-18 ENCOUNTER — Encounter: Payer: Medicare Other | Admitting: Student in an Organized Health Care Education/Training Program

## 2020-11-30 ENCOUNTER — Other Ambulatory Visit: Payer: Self-pay | Admitting: Family Medicine

## 2020-12-05 ENCOUNTER — Telehealth: Payer: Self-pay | Admitting: *Deleted

## 2020-12-05 NOTE — Telephone Encounter (Signed)
LM for patient to call back.  He stated in a voicemail that he needed refills on his nausea medication.  Will forward to MD to see if he can send something in.  There isn't a pending request from the pharmacy.  Whitman Meinhardt,CMA

## 2020-12-18 DIAGNOSIS — E119 Type 2 diabetes mellitus without complications: Secondary | ICD-10-CM | POA: Diagnosis not present

## 2021-01-08 ENCOUNTER — Other Ambulatory Visit: Payer: Self-pay | Admitting: Family Medicine

## 2021-01-20 ENCOUNTER — Telehealth: Payer: Self-pay

## 2021-01-20 NOTE — Telephone Encounter (Signed)
LVM to have pt call back to schedule AWV.   RE: confirm insurance and schedule AWV on my schedule if times are convenient for patient or other AWV schedule as template permits.   

## 2021-02-12 ENCOUNTER — Other Ambulatory Visit: Payer: Self-pay

## 2021-02-12 DIAGNOSIS — E1142 Type 2 diabetes mellitus with diabetic polyneuropathy: Secondary | ICD-10-CM

## 2021-02-16 MED ORDER — PRAVASTATIN SODIUM 40 MG PO TABS: 40.0000 mg | ORAL_TABLET | Freq: Every day | ORAL | 0 refills | Status: DC

## 2021-02-17 NOTE — Telephone Encounter (Signed)
2nd attempt made to schedule AWV.   Closing note.

## 2021-02-26 ENCOUNTER — Other Ambulatory Visit: Payer: Self-pay

## 2021-02-26 MED ORDER — VALSARTAN 80 MG PO TABS: ORAL_TABLET | ORAL | 0 refills | Status: DC

## 2021-02-27 ENCOUNTER — Other Ambulatory Visit: Payer: Self-pay

## 2021-02-27 MED ORDER — LIRAGLUTIDE 18 MG/3ML ~~LOC~~ SOPN: 1.8000 mg | PEN_INJECTOR | Freq: Every day | SUBCUTANEOUS | 3 refills | Status: DC

## 2021-03-04 ENCOUNTER — Other Ambulatory Visit: Payer: Self-pay

## 2021-03-04 MED ORDER — HUMALOG 100 UNIT/ML ~~LOC~~ SOLN: SUBCUTANEOUS | 2 refills | Status: DC

## 2021-03-16 DIAGNOSIS — E119 Type 2 diabetes mellitus without complications: Secondary | ICD-10-CM | POA: Diagnosis not present

## 2021-04-15 ENCOUNTER — Other Ambulatory Visit: Payer: Self-pay | Admitting: *Deleted

## 2021-04-15 MED ORDER — HYDROCHLOROTHIAZIDE 12.5 MG PO TABS: 12.5000 mg | ORAL_TABLET | Freq: Every day | ORAL | 0 refills | Status: DC

## 2021-05-13 DIAGNOSIS — G4733 Obstructive sleep apnea (adult) (pediatric): Secondary | ICD-10-CM | POA: Diagnosis not present

## 2021-06-24 DIAGNOSIS — E119 Type 2 diabetes mellitus without complications: Secondary | ICD-10-CM | POA: Diagnosis not present

## 2021-06-26 DIAGNOSIS — E119 Type 2 diabetes mellitus without complications: Secondary | ICD-10-CM | POA: Diagnosis not present

## 2021-08-19 DIAGNOSIS — G4733 Obstructive sleep apnea (adult) (pediatric): Secondary | ICD-10-CM | POA: Diagnosis not present

## 2021-09-24 DIAGNOSIS — E119 Type 2 diabetes mellitus without complications: Secondary | ICD-10-CM | POA: Diagnosis not present

## 2022-02-16 ENCOUNTER — Telehealth: Payer: Self-pay

## 2022-02-16 NOTE — Patient Outreach (Signed)
  Care Coordination   02/16/2022 Name: [name] MRN: [id number] DOB: 1965/07/08   Care Coordination Outreach Attempts:  An unsuccessful telephone outreach was attempted today to offer the patient information about available care coordination services as a benefit of their health plan.   Follow Up Plan:  Additional outreach attempts will be made to offer the patient care coordination information and services.   Encounter Outcome:  No Answer  Care Coordination Interventions Activated:  No   Care Coordination Interventions:  No, not indicated    Bary Leriche, RN, MSN [facility] Care Management Care Management Coordinator Direct Line [phone] Toll Free: [phone]  Fax: [phone]

## 2022-02-22 ENCOUNTER — Telehealth: Payer: Self-pay

## 2022-02-22 NOTE — Patient Outreach (Signed)
  Care Coordination   02/22/2022 Name: ADYN SERNA MRN: [id number] DOB: 1965-07-29   Care Coordination Outreach Attempts:  A second unsuccessful outreach was attempted today to offer the patient with information about available care coordination services as a benefit of their health plan.     Follow Up Plan:  Additional outreach attempts will be made to offer the patient care coordination information and services.   Encounter Outcome:  No Answer  Care Coordination Interventions Activated:  No   Care Coordination Interventions:  No, not indicated    Bary Leriche, RN, MSN [facility] Care Management Care Management Coordinator Direct Line [phone] Toll Free: [phone]  Fax: [phone]

## 2022-02-26 ENCOUNTER — Telehealth: Payer: Self-pay

## 2022-02-26 NOTE — Patient Outreach (Signed)
  Care Coordination   02/26/2022 Name: [name] MRN: [id number] DOB: 1965-08-04   Care Coordination Outreach Attempts:  A third unsuccessful outreach was attempted today to offer the patient with information about available care coordination services as a benefit of their health plan.   Follow Up Plan:  No further outreach attempts will be made at this time. We have been unable to contact the patient to offer or enroll patient in care coordination services  Encounter Outcome:  No Answer  Care Coordination Interventions Activated:  No   Care Coordination Interventions:  No, not indicated    Bary Leriche, RN, MSN [facility] Care Management Care Management Coordinator Direct Line [phone]

## 2022-06-21 ENCOUNTER — Telehealth: Payer: Self-pay

## 2022-06-21 NOTE — Patient Outreach (Signed)
  Care Coordination   Initial Visit Note   06/21/2022 Name: [name] MRN: [id number] DOB: 1965-10-03  [name] is a 57 y.o. year old male who sees Drew Brill, DO for primary care. I spoke with  [name] by phone today.  What matters to the patients health and wellness today?  None. Patient reports he has changed his eating habits and come off lots of medications.  Advised patient to schedule annual visit with PCP office. Decline needing assist to schedule.     Goals Addressed             This Visit's Progress    COMPLETED: Care Coordination Activities-No follow up required       Care Coordination Interventions: Advised patient to Annual Wellness exam. Discussed [facility] services and support. Assessed SDOH. Advised to discuss with primary care physician if services needed in the future.         SDOH assessments and interventions completed:  Yes  SDOH Interventions Today    Flowsheet Row Most Recent Value  SDOH Interventions   Food Insecurity Interventions Intervention Not Indicated  Housing Interventions Intervention Not Indicated        Care Coordination Interventions:  Yes, provided   Follow up plan: No further intervention required.   Encounter Outcome:  Pt. Visit Completed   Drew Baseman, RN, MSN [place] [facility] Care Management Coordinator Direct Line [phone]

## 2022-06-21 NOTE — Addendum Note (Signed)
Addended by: Jone Baseman on: 06/21/2022 10:02 AM   Modules accepted: Orders, Level of Service

## 2022-06-21 NOTE — Telephone Encounter (Signed)
This encounter was created in error - please disregard.

## 2022-06-21 NOTE — Patient Outreach (Addendum)
  Care Coordination   Initial Visit Note   06/21/2022 Name: [name] MRN: [id number] DOB: [birth year]  [name] is a 57 y.o. year old male who sees Orvis Brill, DO for primary care. I spoke with  [name] by phone today.  What matters to the patients health and wellness today?  None Patient reports he is doing well.  However, patient has not seen PCP in over a year. Patient to call to schedule appointment.    Goals Addressed             This Visit's Progress    COMPLETED: Care Coordination Activities-No follow up required       Care Coordination Interventions: Advised patient to Annual Wellness exam. Discussed [facility] services and support. Assessed SDOH. Advised to discuss with primary care physician if services needed in the future.         SDOH assessments and interventions completed:  Yes     Care Coordination Interventions:  Yes, provided   Follow up plan: No further intervention required.   Encounter Outcome:  Pt. Visit Completed   Jone Baseman, RN, MSN [place] Management Care Management Coordinator Direct Line [phone]

## 2022-06-21 NOTE — Patient Instructions (Addendum)
Visit Information  Thank you for taking time to visit with me today. Please don't hesitate to contact me if I can be of assistance to you.   Following are the goals we discussed today:   Goals Addressed             This Visit's Progress    COMPLETED: Care Coordination Activities-No follow up required       Care Coordination Interventions: Advised patient to Annual Wellness exam. Discussed [facility] services and support. Assessed SDOH. Advised to discuss with primary care physician if services needed in the future.          If you are experiencing a Mental Health or Behavioral Health Crisis or need someone to talk to, please call the Suicide and Crisis Lifeline: 988   The patient verbalized understanding of instructions, educational materials, and care plan provided today and DECLINED offer to receive copy of patient instructions, educational materials, and care plan.   No further follow up required: decline  Dynesha Woolen J Emari Demmer, RN, MSN [facility] Care Management Care Management Coordinator Direct Line [phone]     

## 2022-06-21 NOTE — Patient Instructions (Signed)
Visit Information  Thank you for taking time to visit with me today. Please don't hesitate to contact me if I can be of assistance to you.   Following are the goals we discussed today:   Goals Addressed             This Visit's Progress    COMPLETED: Care Coordination Activities-No follow up required       Care Coordination Interventions: Advised patient to Annual Wellness exam. Discussed [facility] services and support. Assessed SDOH. Advised to discuss with primary care physician if services needed in the future.          If you are experiencing a Mental Health or Behavioral Health Crisis or need someone to talk to, please call the Suicide and Crisis Lifeline: 988   The patient verbalized understanding of instructions, educational materials, and care plan provided today and DECLINED offer to receive copy of patient instructions, educational materials, and care plan.   No further follow up required: decline  Sunnie Odden J Oliviagrace Crisanti, RN, MSN [facility] Care Management Care Management Coordinator Direct Line [phone]     

## 2023-01-25 ENCOUNTER — Telehealth: Payer: Self-pay

## 2023-01-25 NOTE — Telephone Encounter (Signed)
LVM for patient to call back [phone], or to call PCP office to schedule follow up apt. AS, CMA  

## 2023-02-03 ENCOUNTER — Encounter (HOSPITAL_BASED_OUTPATIENT_CLINIC_OR_DEPARTMENT_OTHER): Payer: Self-pay

## 2023-02-03 ENCOUNTER — Emergency Department (HOSPITAL_BASED_OUTPATIENT_CLINIC_OR_DEPARTMENT_OTHER)
Admission: EM | Admit: 2023-02-03 | Discharge: 2023-02-03 | Discharge status: Against Medical Advice | Payer: Medicare (Managed Care)

## 2023-02-03 ENCOUNTER — Emergency Department (HOSPITAL_BASED_OUTPATIENT_CLINIC_OR_DEPARTMENT_OTHER): Payer: Medicare (Managed Care)

## 2023-02-03 ENCOUNTER — Other Ambulatory Visit: Payer: Self-pay

## 2023-02-03 DIAGNOSIS — I10 Essential (primary) hypertension: Secondary | ICD-10-CM | POA: Insufficient documentation

## 2023-02-03 DIAGNOSIS — Z79899 Other long term (current) drug therapy: Secondary | ICD-10-CM | POA: Insufficient documentation

## 2023-02-03 DIAGNOSIS — I6522 Occlusion and stenosis of left carotid artery: Secondary | ICD-10-CM | POA: Diagnosis not present

## 2023-02-03 DIAGNOSIS — R4701 Aphasia: Secondary | ICD-10-CM | POA: Diagnosis present

## 2023-02-03 DIAGNOSIS — I6523 Occlusion and stenosis of bilateral carotid arteries: Secondary | ICD-10-CM | POA: Diagnosis not present

## 2023-02-03 DIAGNOSIS — Z794 Long term (current) use of insulin: Secondary | ICD-10-CM | POA: Diagnosis not present

## 2023-02-03 DIAGNOSIS — Z5329 Procedure and treatment not carried out because of patient's decision for other reasons: Secondary | ICD-10-CM | POA: Diagnosis not present

## 2023-02-03 DIAGNOSIS — Z7984 Long term (current) use of oral hypoglycemic drugs: Secondary | ICD-10-CM | POA: Diagnosis not present

## 2023-02-03 DIAGNOSIS — Z7982 Long term (current) use of aspirin: Secondary | ICD-10-CM | POA: Insufficient documentation

## 2023-02-03 DIAGNOSIS — R29818 Other symptoms and signs involving the nervous system: Secondary | ICD-10-CM | POA: Diagnosis not present

## 2023-02-03 DIAGNOSIS — I639 Cerebral infarction, unspecified: Secondary | ICD-10-CM | POA: Insufficient documentation

## 2023-02-03 DIAGNOSIS — R9431 Abnormal electrocardiogram [ECG] [EKG]: Secondary | ICD-10-CM | POA: Diagnosis not present

## 2023-02-03 DIAGNOSIS — E119 Type 2 diabetes mellitus without complications: Secondary | ICD-10-CM | POA: Diagnosis not present

## 2023-02-03 DIAGNOSIS — R42 Dizziness and giddiness: Secondary | ICD-10-CM | POA: Diagnosis not present

## 2023-02-03 DIAGNOSIS — I6622 Occlusion and stenosis of left posterior cerebral artery: Secondary | ICD-10-CM | POA: Diagnosis not present

## 2023-02-03 DIAGNOSIS — R4781 Slurred speech: Secondary | ICD-10-CM | POA: Diagnosis not present

## 2023-02-03 LAB — BASIC METABOLIC PANEL
Anion gap: 8 (ref 5–15)
BUN: 24 mg/dL — ABNORMAL HIGH (ref 6–20)
CO2: 27 mmol/L (ref 22–32)
Calcium: 9.2 mg/dL (ref 8.9–10.3)
Chloride: 100 mmol/L (ref 98–111)
Creatinine, Ser: 1.05 mg/dL (ref 0.61–1.24)
GFR, Estimated: >60 mL/min (ref >60)
Glucose, Bld: 297 mg/dL — ABNORMAL HIGH (ref 70–99)
Potassium: 4 mmol/L (ref 3.5–5.1)
Sodium: 135 mmol/L (ref 135–145)

## 2023-02-03 LAB — CBC WITH DIFFERENTIAL/PLATELET
Abs Immature Granulocytes: 0.05 10*3/uL (ref 0.00–0.07)
Basophils Absolute: 0.1 10*3/uL (ref 0.0–0.1)
Basophils Relative: 1 %
Eosinophils Absolute: 0.3 10*3/uL (ref 0.0–0.5)
Eosinophils Relative: 4 %
HCT: 43 % (ref 39.0–52.0)
Hemoglobin: 14.9 g/dL (ref 13.0–17.0)
Immature Granulocytes: 1 %
Lymphocytes Relative: 32 %
Lymphs Abs: 2.5 10*3/uL (ref 0.7–4.0)
MCH: 29.7 pg (ref 26.0–34.0)
MCHC: 34.7 g/dL (ref 30.0–36.0)
MCV: 85.7 fL (ref 80.0–100.0)
Monocytes Absolute: 0.4 10*3/uL (ref 0.1–1.0)
Monocytes Relative: 5 %
Neutro Abs: 4.6 10*3/uL (ref 1.7–7.7)
Neutrophils Relative %: 57 %
Platelets: 250 10*3/uL (ref 150–400)
RBC: 5.02 MIL/uL (ref 4.22–5.81)
RDW: 12.2 % (ref 11.5–15.5)
WBC: 7.9 10*3/uL (ref 4.0–10.5)
nRBC: 0 % (ref 0.0–0.2)

## 2023-02-03 MED ORDER — CLOPIDOGREL BISULFATE 300 MG PO TABS
300.0000 mg | ORAL_TABLET | Freq: Once | ORAL | Status: AC
  Administered 2023-02-03: 300 mg via ORAL
  Filled 2023-02-03: qty 1

## 2023-02-03 MED ORDER — IOHEXOL 350 MG/ML SOLN
100.0000 mL | Freq: Once | INTRAVENOUS | Status: AC | PRN
  Administered 2023-02-03: 75 mL via INTRAVENOUS

## 2023-02-03 NOTE — ED Provider Notes (Signed)
57 year old male who was seen by previous physician, diagnosed with acute stroke.  Plan initially was for hospitalist admission however patient was refusing transport and demanding to drive by POV.  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  In light of these new vascular findings it is not safe for the patient to drive himself independently.  I discussed with the patient the need for transport from this facility to admission at the hospital.  I reviewed the results with the patient and expressed my concern that these findings could mean impending stroke, worsening condition, permanent disability, death.  It is not safe for him to drive himself.  However patient absolutely refuses for our transport.  In light of this the patient will need to sign himself out AMA.  I have recommended that the patient accepts transfer to admission at [facility].  I have expressed the importance of being admitted including the risks of leaving which include worsening condition, permanent disability and death.  Patient accepts these risks.  I have instructed the patient not to drive, he states that he understands.  They are alert and oriented, they have capacity to make this decision.  I have not been able to convince the patient to stay and they understand the risks of leaving AGAINST MEDICAL ADVICE.     Rozelle Logan, DO 02/03/23 1737

## 2023-02-03 NOTE — ED Provider Notes (Signed)
[place] EMERGENCY DEPARTMENT AT [facility] Provider Note   CSN: [id number] Arrival date & time: 02/03/23  1213     History  Chief Complaint  Patient presents with   Aphasia    Onset Monday    [name] is a 57 y.o. male.  57 year old male with past medical history of diabetes, hypertension, and obesity presents the emergency department today with dysarthria and aphasia.  The patient states that he started having the symptoms on Monday evening.  He states that he did have relatively severe dysarthria and aphasia but this has improved somewhat since Monday.  He states that he has also felt unsteady when he walks.  He denies any focal weakness, numbness, or tingling.  Denies any headaches.  Patient denies any fevers or chills or chest pain.  He states that the symptoms have gradually improved since they began.  He denies starting any new medications.  The history is provided by the patient.       Home Medications Prior to Admission medications   Medication Sig Start Date End Date Taking? Authorizing Provider  aspirin 81 MG tablet Take 81 mg by mouth daily.   Yes [provider]  DULoxetine (CYMBALTA) 30 MG capsule Take 2 capsules (60 mg total) by mouth daily. 05/27/20 11/23/20  Edward Jolly, MD  hydrochlorothiazide (HYDRODIURIL) 12.5 MG tablet Take 1 tablet (12.5 mg total) by mouth daily. Patient not taking: Reported on 02/03/2023 04/15/21   Darral Dash, DO  insulin glargine (LANTUS) 100 UNIT/ML injection INJECT UP TO 50 UNITS INTO THE SKIN DAILY. Patient not taking: Reported on 02/03/2023 05/12/20   Jovita Kussmaul, MD  insulin lispro (HUMALOG) 100 UNIT/ML injection INJECT 20 UNITS TOTAL INTO THE SKIN 3 TIMES DAILY BEFORE MEALS Patient not taking: Reported on 02/03/2023 03/04/21   Dameron, Nolberto Hanlon, DO  ketoconazole (NIZORAL) 2 % shampoo APPLY TOPICALLY TO AFFECTED AREA EVERY DAY Patient not taking: Reported on 02/03/2023 09/05/20   Ganta, Anupa, DO   lidocaine (XYLOCAINE) 5 % ointment Apply 1 application topically 4 (four) times daily as needed. 08/21/20 10/20/20  Edward Jolly, MD  liraglutide (VICTOZA) 18 MG/3ML SOPN Inject 1.8 mg into the skin daily. 0.6 mg once daily for 1 week,then increase to 1.2 mg once daily,max 1.8  mg Patient not taking: Reported on 02/03/2023 02/27/21   Darral Dash, DO  pravastatin (PRAVACHOL) 40 MG tablet Take 1 tablet (40 mg total) by mouth daily. Patient not taking: Reported on 02/03/2023 02/16/21   Darral Dash, DO  pregabalin (LYRICA) 50 MG capsule Take 2 capsules (100 mg total) by mouth 2 (two) times daily. 08/21/20 02/17/21  Edward Jolly, MD  REVATIO 20 MG tablet Take 1 tablet (20 mg total) by mouth daily. Patient not taking: Reported on 02/03/2023 04/18/20   Jovita Kussmaul, MD  SYRINGE-NEEDLE, DISP, 3 ML (B-D SYRINGE/NEEDLE 3CC/25GX5/8) 25G X 5/8" 3 ML MISC Use to administer insulin four times per day. Patient not taking: Reported on 02/03/2023 02/12/19   Myrene Buddy, MD  triamcinolone cream (KENALOG) 0.1 % Apply 1 application 2 (two) times daily topically. As needed. Avoid excessive use on face Patient not taking: Reported on 02/03/2023 04/28/17   Oralia Manis, DO  valsartan (DIOVAN) 80 MG tablet TAKE 1 TABLET BY MOUTH EVERYDAY AT BEDTIME. Appointment required before next refill. Patient not taking: Reported on 02/03/2023 02/26/21   Darral Dash, DO      Allergies    Penicillins and Metformin and related    Review of Systems   Review  of Systems Gen: No fevers Eyes: No vision changes HEENT: no congestion, sore throat Neck: no neck stiffness Resp: no cough, shortness of breath Card: no chest pain Abd: no nausea or vomiting, no abdominal pain Extremities: no leg swelling Neuro: no weakness, numbness, tingling Skin: no rashes  Physical Exam Updated Vital Signs BP (!) 151/101   Pulse 78   Temp 97.8 F (36.6 C)   Resp 19   SpO2 98%  Physical Exam Gen: NAD Eyes: PERRL, EOMI HEENT: no  oropharyngeal swelling Neck: trachea midline Resp: clear to auscultation bilaterally Card: RRR, no murmurs, rubs, or gallops Abd: nontender, nondistended Extremities: no calf tenderness, no edema Vascular: 2+ radial pulses bilaterally, 2+ DP pulses bilaterally Neuro: See NIH stroke scale Skin: no rashes Psyc: acting appropriately  ED Results / Procedures / Treatments   Labs (all labs ordered are listed, but only abnormal results are displayed) Labs Reviewed  BASIC METABOLIC PANEL - Abnormal; Notable for the following components:      Result Value   Glucose, Bld 297 (*)    BUN 24 (*)    All other components within normal limits  CBC WITH DIFFERENTIAL/PLATELET    EKG None  Radiology MR BRAIN WO CONTRAST  Result Date: 02/03/2023 CLINICAL DATA:  Neuro deficit, acute, stroke suspected. Slurred speech and dizziness. EXAM: MRI HEAD WITHOUT CONTRAST TECHNIQUE: Multiplanar, multiecho pulse sequences of the brain and surrounding structures were obtained without intravenous contrast. COMPARISON:  Head CT 02/03/2023. FINDINGS: Brain: Acute perforator infarct in the left paramedian pons. No acute hemorrhage or significant mass effect. Background of mild chronic small-vessel disease. No hydrocephalus or extra-axial collection. No mass effect or midline shift. Vascular: Normal flow voids. Skull and upper cervical spine: Normal marrow signal. Sinuses/Orbits: No acute findings. Other: None. IMPRESSION: Acute perforator infarct in the left paramedian pons. No acute hemorrhage or significant mass effect. Electronically Signed   By: Orvan Falconer M.D.   On: 02/03/2023 15:18   CT Head Wo Contrast  Result Date: 02/03/2023 CLINICAL DATA:  Neuro deficit, acute, stroke suspected. Slurred speech for 3 days. Associated dizziness. EXAM: CT HEAD WITHOUT CONTRAST TECHNIQUE: Contiguous axial images were obtained from the base of the skull through the vertex without intravenous contrast. RADIATION DOSE REDUCTION:  This exam was performed according to the departmental dose-optimization program which includes automated exposure control, adjustment of the mA and/or kV according to patient size and/or use of iterative reconstruction technique. COMPARISON:  None Available. FINDINGS: Brain: No acute infarct, hemorrhage, or mass lesion is present. Periventricular white matter hypoattenuation is mildly advanced for age. No acute or focal cortical abnormalities are present. Deep brain nuclei are within normal limits. The ventricles are of normal size. No significant extraaxial fluid collection is present. The brainstem and cerebellum are within normal limits. Midline structures are within normal limits. Vascular: Atherosclerotic calcifications are present within the cavernous internal carotid arteries bilaterally. No hyperdense vessel is present. Skull: Calvarium is intact. No focal lytic or blastic lesions are present. No significant extracranial soft tissue lesion is present. Sinuses/Orbits: The paranasal sinuses and mastoid air cells are clear. The globes and orbits are within normal limits. IMPRESSION: 1. No acute intracranial abnormality. 2. Periventricular white matter hypoattenuation is mildly advanced for age. This likely reflects the sequela of chronic microvascular ischemia. Electronically Signed   By: Marin Roberts M.D.   On: 02/03/2023 13:14    Procedures Procedures    Medications Ordered in ED Medications  clopidogrel (PLAVIX) tablet 300 mg (300 mg Oral Given  02/03/23 1547)  iohexol (OMNIPAQUE) 350 MG/ML injection 100 mL (75 mLs Intravenous Contrast Given 02/03/23 1600)    ED Course/ Medical Decision Making/ A&P                   NIH Stroke Scale: 1              Medical Decision Making 56 year old male with past medical history of diabetes and hypertension presenting to the emergency department today with 3 days of way described as dysarthria, aphasia, and disequilibrium.  I will further evaluate  the patient here with basic labs as well as an EKG to evaluate for electrolyte abnormalities or atrial fibrillation.  I will keep patient on the monitor for this.  Patient did have a CT scan of his head ordered at triage.  I will follow-up on this.  I will also order an MRI to evaluate for CVA at this time.  The patient denies any infectious symptoms or other concerning symptoms at this time.  I will reevaluate for ultimate disposition.  Patient's labs are largely unremarkable.  His CT scan was negative.  MRI does show findings for CVA.  I did speak with Dr. Iver Nestle.  She recommends starting the patient on Plavix.  This was started in the emergency department.  The patient is adamant that he drives himself over to colon.  He is ambulatory here with a steady gait.  Despite this I did recommend strongly that he allow [place] to set up transport but he is not agreeable to this.  I did explain to him that until he is fully evaluated by physical therapy this could potentially be dangerous.  The patient does have capacity to make his own medical decisions.  He is strongly advised to wait for transport but will be admitted for further management.  Amount and/or Complexity of Data Reviewed Radiology: ordered.  Risk Prescription drug management.           Final Clinical Impression(s) / ED Diagnoses Final diagnoses:  Cerebrovascular accident (CVA), unspecified mechanism (HCC)  Dispo: discharge  Rx / DC Orders ED Discharge Orders     None         Durwin Glaze, MD 02/03/23 1614

## 2023-02-03 NOTE — ED Triage Notes (Signed)
Pt c/o slurred speech since Monday night, sudden onset. Associated dizziness, states he is "not as steady as usual & I'm talking funny." States he went to [facility] for eval, sent for CT. Denies unilateral deficit, weakness, additional symptoms.

## 2023-02-03 NOTE — ED Notes (Signed)
Dr Wilkie Aye spoke to patient  Please refer to MD notes

## 2023-02-03 NOTE — ED Notes (Signed)
Pt ambulatory to CT per request

## 2023-02-03 NOTE — Discharge Instructions (Signed)
You have chosen to leave the emergency department AGAINST MEDICAL ADVICE.  I have explained to you your testing results and the need for further evaluation/admission.  You have verbalized understanding of these results and plan and are choosing to leave the hospital AGAINST MEDICAL ADVICE. You understand the risks associated with leaving without further evaluation/treatment. If you have any worsening symptoms or choose to be treated please return immediately to emergency department.  It is not safe for you to drive until you are admitted and evaluated by neurology.  If you choose to be admitted you can drive yourself to the ER at [facility].
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
## 2023-02-08 ENCOUNTER — Other Ambulatory Visit (INDEPENDENT_AMBULATORY_CARE_PROVIDER_SITE_OTHER): Payer: Medicare (Managed Care)

## 2023-02-08 ENCOUNTER — Ambulatory Visit (INDEPENDENT_AMBULATORY_CARE_PROVIDER_SITE_OTHER): Payer: Medicare (Managed Care) | Admitting: Neurology

## 2023-02-08 ENCOUNTER — Encounter: Payer: Self-pay | Admitting: Neurology

## 2023-02-08 VITALS — BP 152/96 | HR 107 | Ht 68.0 in | Wt 212.2 lb

## 2023-02-08 DIAGNOSIS — Z131 Encounter for screening for diabetes mellitus: Secondary | ICD-10-CM

## 2023-02-08 DIAGNOSIS — I6529 Occlusion and stenosis of unspecified carotid artery: Secondary | ICD-10-CM | POA: Diagnosis not present

## 2023-02-08 DIAGNOSIS — I10 Essential (primary) hypertension: Secondary | ICD-10-CM | POA: Diagnosis not present

## 2023-02-08 DIAGNOSIS — I639 Cerebral infarction, unspecified: Secondary | ICD-10-CM

## 2023-02-08 DIAGNOSIS — E1142 Type 2 diabetes mellitus with diabetic polyneuropathy: Secondary | ICD-10-CM | POA: Diagnosis not present

## 2023-02-08 LAB — LIPID PANEL
Cholesterol: 256 mg/dL — ABNORMAL HIGH (ref 0–200)
HDL: 38.6 mg/dL — ABNORMAL LOW (ref >39.00)
Total CHOL/HDL Ratio: 7
Triglycerides: 538 mg/dL — ABNORMAL HIGH (ref 0.0–149.0)

## 2023-02-08 LAB — HEMOGLOBIN A1C: Hgb A1c MFr Bld: 12.3 % — ABNORMAL HIGH (ref 4.6–6.5)

## 2023-02-08 LAB — LDL CHOLESTEROL, DIRECT: Direct LDL: 175 mg/dL

## 2023-02-08 NOTE — Patient Instructions (Addendum)
Take aspirin 81mg  daily Mediterranean diet (see below) Routine cardio exercise Establish care with a PCP Further testing: 2D echo 2 week cardiac event monitor Check Hgb A1c, lipid panel and LFTs Follow up 6-7 months.     Help finding a primary care provider within [place]. If you do not have access to a computer or the Internet you can call [phone] and  they will help you scheduled with a provider.  Or you can go to: [email or web address]

## 2023-02-10 ENCOUNTER — Telehealth: Payer: Self-pay

## 2023-02-10 ENCOUNTER — Other Ambulatory Visit: Payer: Self-pay

## 2023-02-10 ENCOUNTER — Encounter: Payer: Self-pay | Admitting: *Deleted

## 2023-02-10 ENCOUNTER — Other Ambulatory Visit: Payer: Self-pay | Admitting: Neurology

## 2023-02-10 ENCOUNTER — Other Ambulatory Visit: Payer: Medicare (Managed Care)

## 2023-02-10 ENCOUNTER — Other Ambulatory Visit (INDEPENDENT_AMBULATORY_CARE_PROVIDER_SITE_OTHER): Payer: Medicare (Managed Care)

## 2023-02-10 DIAGNOSIS — E1142 Type 2 diabetes mellitus with diabetic polyneuropathy: Secondary | ICD-10-CM

## 2023-02-10 DIAGNOSIS — I639 Cerebral infarction, unspecified: Secondary | ICD-10-CM | POA: Diagnosis not present

## 2023-02-10 DIAGNOSIS — I6529 Occlusion and stenosis of unspecified carotid artery: Secondary | ICD-10-CM | POA: Diagnosis not present

## 2023-02-10 DIAGNOSIS — Z131 Encounter for screening for diabetes mellitus: Secondary | ICD-10-CM

## 2023-02-10 DIAGNOSIS — I10 Essential (primary) hypertension: Secondary | ICD-10-CM

## 2023-02-10 DIAGNOSIS — R2681 Unsteadiness on feet: Secondary | ICD-10-CM

## 2023-02-10 LAB — HEPATIC FUNCTION PANEL
ALT: 17 U/L (ref 0–53)
AST: 14 U/L (ref 0–37)
Albumin: 3.9 g/dL (ref 3.5–5.2)
Alkaline Phosphatase: 90 U/L (ref 39–117)
Bilirubin, Direct: 0 mg/dL (ref 0.0–0.3)
Total Bilirubin: 0.2 mg/dL (ref 0.2–1.2)
Total Protein: 6.8 g/dL (ref 6.0–8.3)

## 2023-02-10 NOTE — Telephone Encounter (Signed)
NO PA needed, Per [facility] Cardiology please send patient to [facility].

## 2023-02-10 NOTE — Telephone Encounter (Signed)
-----   Message from Cira Servant sent at 02/09/2023 12:12 PM EDT ----- Cholesterol is very high.  I would like to start a statin.  However, I need a baseline liver function test which it appears that the lab did not draw.  We need to check a hepatic function panel.  His diabetes is uncontrolled.  This urgently needs to be treated by primary care as this is outside of my scope of practice.  I want to try and get him scheduled with a PCP soon.  This is urgent as he just had a stroke and has multiple medical risk factors that are uncontrolled.

## 2023-02-10 NOTE — Progress Notes (Unsigned)
Enrolled for [employer] to mail a ZIO AT Live Telemetry monitor to patients address on file.   Letter with instructions mailed to patient.  DOD to read.

## 2023-02-11 ENCOUNTER — Telehealth: Payer: Self-pay

## 2023-02-11 MED ORDER — ATORVASTATIN CALCIUM 80 MG PO TABS: 80.0000 mg | ORAL_TABLET | Freq: Every day | ORAL | 5 refills | Status: DC

## 2023-02-11 NOTE — Telephone Encounter (Signed)
Patient returning our call, Lab results given. Liver function looks okay.  I would like to go ahead and start Lipitor 80mg  daily for high cholesterol

## 2023-02-11 NOTE — Progress Notes (Signed)
LMOVm, for patient to call back.

## 2023-02-15 ENCOUNTER — Telehealth: Payer: Self-pay

## 2023-02-15 NOTE — Telephone Encounter (Signed)
Transition Care Management Unsuccessful Follow-up Telephone Call  Date of discharge and from where:  [facility] 8/22  Attempts:  1st Attempt  Reason for unsuccessful TCM follow-up call:  No answer/busy   Lenard Forth [place]  [facility], [facility] Guide, Phone: [phone] Website: [email or web address]

## 2023-02-15 NOTE — Telephone Encounter (Signed)
Transition Care Management Unsuccessful Follow-up Telephone Call  Date of discharge and from where:  [facility] 8/22  Attempts:  2nd Attempt  Reason for unsuccessful TCM follow-up call:  No answer/busy   [name]  [facility], [facility] Guide, Phone: [phone] Website: [email or web address]

## 2023-02-17 ENCOUNTER — Encounter: Payer: Self-pay | Admitting: Family Medicine

## 2023-02-17 ENCOUNTER — Ambulatory Visit (INDEPENDENT_AMBULATORY_CARE_PROVIDER_SITE_OTHER): Payer: Medicare (Managed Care) | Admitting: Family Medicine

## 2023-02-17 VITALS — BP 156/100 | HR 60 | Ht 68.0 in | Wt 215.8 lb

## 2023-02-17 DIAGNOSIS — Z8673 Personal history of transient ischemic attack (TIA), and cerebral infarction without residual deficits: Secondary | ICD-10-CM | POA: Diagnosis not present

## 2023-02-17 DIAGNOSIS — E785 Hyperlipidemia, unspecified: Secondary | ICD-10-CM

## 2023-02-17 DIAGNOSIS — E1142 Type 2 diabetes mellitus with diabetic polyneuropathy: Secondary | ICD-10-CM

## 2023-02-17 DIAGNOSIS — I10 Essential (primary) hypertension: Secondary | ICD-10-CM | POA: Diagnosis not present

## 2023-02-17 MED ORDER — BLOOD GLUCOSE MONITORING SUPPL DEVI
1.0000 | Freq: Three times a day (TID) | 0 refills | Status: AC
Start: 2023-02-17 — End: ?

## 2023-02-17 MED ORDER — BLOOD GLUCOSE TEST VI STRP: 1.0000 | ORAL_STRIP | Freq: Three times a day (TID) | 1 refills | Status: AC

## 2023-02-17 MED ORDER — EMPAGLIFLOZIN 10 MG PO TABS: 10.0000 mg | ORAL_TABLET | Freq: Every day | ORAL | 3 refills | Status: DC

## 2023-02-17 MED ORDER — LANCETS MISC. MISC: 1.0000 | Freq: Three times a day (TID) | 0 refills | Status: AC

## 2023-02-17 MED ORDER — OLMESARTAN MEDOXOMIL 20 MG PO TABS: 20.0000 mg | ORAL_TABLET | Freq: Every day | ORAL | 3 refills | Status: DC

## 2023-02-17 MED ORDER — LANCET DEVICE MISC: 1.0000 | Freq: Three times a day (TID) | 0 refills | Status: AC

## 2023-02-17 NOTE — Progress Notes (Addendum)
SUBJECTIVE:   CHIEF COMPLAINT / HPI:  [name] is a 57 y.o. male with a pertinent past medical history of T2DM with polyneuropathy, HTN, HLD, chronic neck and back pain, and recent ischemic PCA stroke 8/22 presenting to the clinic for hospital follow up after stroke and to reestablish care with our clinic.  Hospital f/u: Stroke 8/22 Patient was seen at [facility] ED on 8/22 and diagnosed with occlusion/high-grade stenosis of the right PCA with severe stenosis of the left ICA and the left PCA by CTA.  At the time, patient was experiencing some dysarthric speech and aphasia.  He was prescribed Plavix and aspirin.  However, patient left AMA once recommended transfer to [facility] for admission, he discontinued Plavix himself soon after.  He was seen for follow-up by neurology on 8/27 with grossly normal neurological exam and started on a statin, maintained on aspirin 81 mg, did not resume Plavix.  He was ordered a 2-week Zio patch cardiac monitor and a 2D echo was ordered.  Echo has not yet been completed and cardiac monitor has not yet arrived in mail.  He was also asked to establish care with PCP.  Today, speech is normal.  Feels "much better."  Playing guitar again, and feels he almost has his normal level dexterity.  Patient endorses a high degree of motivation to improve his health to prevent further stroke.  He notes that he has lost approximately 100 pounds in the past 2 years and has made significant lifestyle changes.  He is walking better than he did a few years ago, as he used to ambulate with a cane but since his weight loss he has been able to stop using this assistive device.  He states that he has lived a "wild life" with lots of injuries and falls from high places when he worked Licensed [employer] and wonders if this past lifestyle is contributing to his stroke.  Diabetes Last A1c 12.3 on 8/27 Home CBGs: Not checking, lost monitor Medications: None, used to take insulin >1 year  ago Adherence: Poor Eye exam: Overdue Foot exam: Overdue Microalbumin: Ordered today Statin: Atorvastatin 80 mg prescribed 8/30, has not yet picked up or started No symptoms of hypoglycemia, numbness extremities, 1 healing foot ulcer, had significant right big toe trauma when he dropped something heavy on it about half a year ago; is now healing Some polyuria, polydipsia  Hypertension BP: (!) 156/100 today. Home medications include: None Does check blood pressure at home, home BP readings have also been high. Diet: Does eat salty processed meats such as hotdogs often. Exercise: walking more and more. Patient has had a BMP in the past 1 year.  Most recent creatinine trend:  Lab Results  Component Value Date   CREATININE 1.05 02/03/2023   CREATININE 0.86 10/06/2020   CREATININE 0.78 03/27/2019    PERTINENT PMH / PSH: Has a smoking history of history of 100 pack years, quit 13-14 years ago. Drinks alcohol rarely.   OBJECTIVE:   BP (!) 156/100 (BP Location: Right Arm)   Pulse 60   Ht 5\' 8"  (1.727 m)   Wt 215 lb 12.8 oz (97.9 kg)   SpO2 98%   BMI 32.81 kg/m   General: Age-appropriate, resting comfortably in chair, NAD, alert and at baseline. HEENT:  Head: Normocephalic, atraumatic. No tenderness to percussion over sinuses. Eyes: PERRLA. No conjunctival erythema or scleral injections. Mouth/Oral: Clear.  Poor dentition, missing multiple teeth.  MMM. Neck: Supple. No LAD. Cardiovascular: Regular rate  and rhythm. Normal S1/S2. No murmurs, rubs, or gallops appreciated. 2+ radial pulses. Pulmonary: Clear bilaterally to ascultation. No increased WOB, no accessory muscle usage. No wheezes, crackles, or rhonchi. Skin: Warm and dry.  No rashes grossly. Extremities: Right big toe healing ulcer.  Intact sensation to touch on BLE.  No peripheral edema bilaterally.  Venous stasis changes overlying BLE.  Capillary refill <2 seconds.  Neurological Examination: (overall  unremarkable) MS: Awake, alert, interactive. Normal eye contact, answered the questions appropriately, speech was fluent, normal comprehension.  No dysarthria noted.  Attention and concentration were normal. Cranial Nerves: Pupils were equal and reactive to light (5-63mm); EOM normal, no nystagmus, no ptsosis, no double vision; intact facial sensation; face symmetric with full strength of facial muscles; palate elevation is symmetric; tongue protrusion is symmetric with full movement to both sides; sternocleidomastoid and trapezius are with normal strength. Strength: Normal strength in all muscle groups bilaterally. Sensation: Romberg negative. Coordination: No dysmetria on FTN test. No difficulty with balance. Gait: Ambulates slowly, slight limp, states this is normal for him.   ASSESSMENT/PLAN:   HYPERTENSION, BENIGN SYSTEMIC Poorly controlled, stroke risk factor.  Not on any medications at this time.  Did discuss decreasing amount of salt in diet, patient not receptive. -Starting olmesartan 20 mg daily -Instructed patient to start taking daily BP and logging -Follow-up in 2 weeks for BMP and med titration  Hx of ischemic right PCA stroke No evidence of lasting neurological deficit today.  Patient is motivated to improve health and prevent repeat stroke.  Patient has not followed up with medical providers for a few years.  He has made multiple positive lifestyle changes including losing > 100 pounds, however continues to have multiple stroke risk factors.  At this time, addressing diabetes, hypertension, hyperlipidemia are of high importance.  Plan outlined for each problem here.  General health maintenance items will also need to be addressed gradually as patient reestablishes care. Of note, patient did have PHQ-9 score of 21 today, did not wish to discuss depression now but will follow-up at next appointment in 2 weeks given risk of depression and post stroke period. -Continue aspirin 81 mg  daily -Unclear if neurology wants Plavix continued, sent clarifying staff message to Dr. Shon Millet, DO -Patient will call [facility] to ensure 2D echocardiogram scheduling -Patient expects Zio patch to arrive in the mail shortly, follow-up next visit  T2DM (type 2 diabetes mellitus) (HCC) Poorly controlled, A1c 12.3.  Discussed continuing to increase exercise, congratulated patient on significant weight loss. -Start Jardiance 10 mg daily -Ordered CBG monitor, lancets, and test strips -Instructed patient to start daily fasting CBG log -Follow-up in 2 weeks, consider starting insulin  Hyperlipidemia Poorly controlled, most recent direct LDL 175, has not picked up atorvastatin.  Goal <70. -Start atorvastatin 80 mg  Return in about 2 weeks (around 03/03/2023) for diabetes/BP f/u.  Phoenicia Pirie Sharion Dove, MD [facility]

## 2023-02-17 NOTE — Assessment & Plan Note (Addendum)
Poorly controlled, most recent direct LDL 175, has not picked up atorvastatin.  Goal <70. -Start atorvastatin 80 mg

## 2023-02-17 NOTE — Assessment & Plan Note (Addendum)
Poorly controlled, A1c 12.3.  Discussed continuing to increase exercise, congratulated patient on significant weight loss. -Start Jardiance 10 mg daily -Ordered CBG monitor, lancets, and test strips -Instructed patient to start daily fasting CBG log -Follow-up in 2 weeks, consider starting insulin

## 2023-02-17 NOTE — Assessment & Plan Note (Addendum)
Poorly controlled, stroke risk factor.  Not on any medications at this time.  Did discuss decreasing amount of salt in diet, patient not receptive. -Starting olmesartan 20 mg daily -Instructed patient to start taking daily BP and logging -Follow-up in 2 weeks for BMP and med titration

## 2023-02-17 NOTE — Patient Instructions (Signed)
It was great to see you today! Thank you for choosing [facility] for your primary care.  Today we addressed: 1.  Diabetes I am starting you on Jardiance 10 mg daily.  I am also prescribing a blood sugar monitor and strips.  Please check your blood sugar every morning and write it down.  2.  Blood pressure I am starting you on olmesartan 20 mg daily.  Please use your blood pressure cuff every morning and write down the numbers.  3.  Cholesterol You have atorvastatin 80 mg prescribed at the pharmacy from your neurologist appointment.  Please pick that up and start taking it.  Please continue taking your aspirin 81 mg.  Please call [facility] about your echocardiogram/heart imaging.  The number is below: [address], [place], [place] [address] Phone: [phone]  You should return to our clinic in 2 weeks with your blood pressure and blood sugar results and we will adjust her medications and probably get some labs.  Thank you for coming to see [place] at [facility] and for the opportunity to care for you! Sharion Dove, Lesleyanne Politte, MD 02/17/2023, 11:07 AM

## 2023-02-17 NOTE — Assessment & Plan Note (Addendum)
No evidence of lasting neurological deficit today.  Patient is motivated to improve health and prevent repeat stroke.  Patient has not followed up with medical providers for a few years.  He has made multiple positive lifestyle changes including losing > 100 pounds, however continues to have multiple stroke risk factors.  At this time, addressing diabetes, hypertension, hyperlipidemia are of high importance.  Plan outlined for each problem here.  General health maintenance items will also need to be addressed gradually as patient reestablishes care. Of note, patient did have PHQ-9 score of 21 today, did not wish to discuss depression now but will follow-up at next appointment in 2 weeks given risk of depression and post stroke period. -Continue aspirin 81 mg daily -Unclear if neurology wants Plavix continued, sent clarifying staff message to Dr. Shon Millet, DO -Patient will call [facility] to ensure 2D echocardiogram scheduling -Patient expects Zio patch to arrive in the mail shortly, follow-up next visit

## 2023-02-18 LAB — MICROALBUMIN / CREATININE URINE RATIO
Creatinine, Urine: 47.4 mg/dL
Microalb/Creat Ratio: 1084 mg/g{creat} — ABNORMAL HIGH (ref 0–29)
Microalbumin, Urine: 513.8 ug/mL

## 2023-02-22 ENCOUNTER — Encounter (HOSPITAL_COMMUNITY): Payer: Self-pay | Admitting: Neurology

## 2023-02-22 ENCOUNTER — Encounter: Payer: Self-pay | Admitting: Family Medicine

## 2023-02-22 ENCOUNTER — Telehealth: Payer: Self-pay | Admitting: Family Medicine

## 2023-02-22 NOTE — Telephone Encounter (Signed)
Called patient 6 times over the course of 2 days to discuss labs, unable to reach patient.

## 2023-02-22 NOTE — Progress Notes (Signed)
Attempted to call patient with lab results 6 times across 2 days, unable to reach him.  Not on MyChart.  Sent letter to patient to notify him that his microalbumin/creatinine ratio is elevated.  Patient already on ARB (olmesartan) for renal protection.  However, he has severe range microalbuminuria and is at high risk of kidney disease.  Will follow closely.  Next visit scheduled with me for 9/23, emphasized importance of follow up in letter.

## 2023-03-02 ENCOUNTER — Encounter (HOSPITAL_COMMUNITY): Payer: Self-pay | Admitting: Radiology

## 2023-03-02 NOTE — Progress Notes (Signed)
Just an FYI. We have made several attempts to contact this patient including sending a letter to schedule or reschedule their echocardiogram. We will be removing the patient from the echo WQ.  02/22/23 MAILED LETTER [facility]  02/22/23 [facility] to schedule x 2 @ 11:58/LBW  02/17/23 [facility] to schedule @ 12:11/LBW     Thank you

## 2023-03-07 ENCOUNTER — Other Ambulatory Visit (HOSPITAL_COMMUNITY): Payer: Self-pay | Admitting: Family Medicine

## 2023-03-07 ENCOUNTER — Ambulatory Visit (INDEPENDENT_AMBULATORY_CARE_PROVIDER_SITE_OTHER): Payer: Medicare (Managed Care) | Admitting: Family Medicine

## 2023-03-07 VITALS — BP 164/107 | Ht 66.0 in | Wt 213.2 lb

## 2023-03-07 DIAGNOSIS — L97509 Non-pressure chronic ulcer of other part of unspecified foot with unspecified severity: Secondary | ICD-10-CM

## 2023-03-07 DIAGNOSIS — E1159 Type 2 diabetes mellitus with other circulatory complications: Secondary | ICD-10-CM

## 2023-03-07 DIAGNOSIS — I152 Hypertension secondary to endocrine disorders: Secondary | ICD-10-CM

## 2023-03-07 DIAGNOSIS — E1142 Type 2 diabetes mellitus with diabetic polyneuropathy: Secondary | ICD-10-CM

## 2023-03-07 DIAGNOSIS — M25512 Pain in left shoulder: Secondary | ICD-10-CM

## 2023-03-07 DIAGNOSIS — G8929 Other chronic pain: Secondary | ICD-10-CM

## 2023-03-07 DIAGNOSIS — M25511 Pain in right shoulder: Secondary | ICD-10-CM

## 2023-03-07 MED ORDER — OLMESARTAN MEDOXOMIL 40 MG PO TABS
40.0000 mg | ORAL_TABLET | Freq: Every day | ORAL | 3 refills | Status: AC
Start: 2023-03-07 — End: ?

## 2023-03-07 MED ORDER — EMPAGLIFLOZIN 10 MG PO TABS: 10.0000 mg | ORAL_TABLET | Freq: Every day | ORAL | 3 refills | Status: AC

## 2023-03-07 MED ORDER — DICLOFENAC SODIUM 1 % EX GEL
2.0000 g | Freq: Four times a day (QID) | CUTANEOUS | 1 refills | Status: AC | PRN
Start: 2023-03-07 — End: ?

## 2023-03-07 NOTE — Assessment & Plan Note (Signed)
Currently poorly controlled, however has not been taking his Jardiance 10 mg as he was unable to pick it up at pharmacy after last visit.  Encouraged patient to return to pharmacy and sent in prescription again.  Instructed him to call if he has any more issues picking up the medicine.  High-priority to improve diabetic control given polyneuropathy, microalbuminuria, and recent stroke. -Reevaluate CBGs in 2 to 3 weeks and consider starting insulin if needed -Continue home CBG monitoring and log

## 2023-03-07 NOTE — Assessment & Plan Note (Signed)
Poorly controlled, goal less than 130/80.  Important to address in setting of recent stroke and severe range albuminuria.  Already on ARB for renal protection. -Increase olmesartan to 40 mg daily, will keep to 1 medication for now to decrease pill burden, consider second agent next visit -BMP ordered today -Follow-up in 2 to 3 weeks, repeat BMP

## 2023-03-07 NOTE — Progress Notes (Signed)
SUBJECTIVE:   CHIEF COMPLAINT / HPI:  [name] is a 57 y.o. 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 Does not want to take ibuprofen because it "tears his stomach up." Has done a "tremendous" amount of drugs, not interested in stronger pain medicine at all. Associates acetaminophen with his Vicodin combination pill in the past, dislikes it. Has had good results with past shoulder injections and likes lidocaine patches.  Hypertension BP: (!) 164/107 today. Home medications include: Olmesartan 20 mg daily. He endorses taking these medications as prescribed. Does check blood pressure at home, ranging ~140-150 SBP, only occasionally lower Exercise: working on walking as much as possible, lots of aches and pains all over. Patient has had a BMP in the past 1 year.  Repeating today. Denies symptoms of hypotension.  Diabetes Last A1c 12.3 on 8/27 Home CBGs: 250-300, 272 fasting today Medications: Jardiance 10 mg, was not able and medicine was not available at pharmacy for unclear reasons Adherence: Poor Eye exam: Overdue Foot exam: Overdue, completed today Microalbumin: High range proteinuria last visit Statin: Atorvastatin 80 mg No symptoms of hypoglycemia Some polyuria, polydipsia Lots of numbness in feet, 2 gradually healing R foot ulcers Walks in flip flops or barefoot all the time, does not wear supportive shoes  PERTINENT PMH / PSH: T2DM with polyneuropathy, HTN, HLD, chronic neck and back pain, and 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 Drinks alcohol rarely.   OBJECTIVE:   BP (!) 164/107   Ht 5\' 6"  (1.676 m)   Wt 213 lb 3.2 oz (96.7 kg)   SpO2 100%   BMI 34.41 kg/m   General:  Appears older than stated age, resting comfortably in chair, NAD, alert and at baseline. Cardiovascular: Regular rate and rhythm. Normal S1/S2. No murmurs, rubs, or gallops appreciated. 2+ radial pulses. Pulmonary: Clear bilaterally to ascultation. No increased WOB, no accessory muscle usage. No wheezes, crackles, or rhonchi. Skin: Warm and dry.  No petechiae or bruising. Extremities: Trace peripheral edema bilaterally.  Venous stasis skin changes overlying bilateral shins.  Capillary refill <2 seconds.  2+ DP and PT pulses bilaterally, feet perfusing.  Diabetic foot exam shows poor sensation and discrimination over all toes and plantar surface of feet.  Sensation intact on dorsal surfaces.  Dark ulcer present on R big toe and on right dorsal surface of foot.  Thick sole calluses present. MSK: Full passive ROM of bilateral shoulders, though pain elicited with movement.  Normal strength and sensation of bilateral upper extremities.   ASSESSMENT/PLAN:   Bilateral shoulder pain This is a chronic, known issue that has been well-established over several years.  Suspect osteoarthritis in the setting of known past shoulder injuries.  No radiculopathy symptoms and unremarkable physical exam.  Poor candidate for shoulder injection at this time due to A1c of 12.3 recently.  Patient prefers to avoid ibuprofen at this time due to past GI symptoms.  Recommended also decreasing frequency of aspirin 325 mg use. -Voltaren 1% gel as needed -OTC lidocaine patches as needed -Plan to reconsider shoulder injection once A1c less than 10  T2DM (type 2 diabetes mellitus) (HCC) Currently poorly controlled, however has not been taking his Jardiance 10 mg as he  was unable to pick it up at pharmacy after last visit.  Encouraged patient to return to pharmacy and sent in prescription again.  Instructed him to call if he has any more issues picking up the medicine.  High-priority to improve diabetic control given polyneuropathy,  microalbuminuria, and recent stroke. -Reevaluate CBGs in 2 to 3 weeks and consider starting insulin if needed -Continue home CBG monitoring and log  Diabetic polyneuropathy associated with type 2 diabetes mellitus (HCC) Patient has very poor sensation of bilateral lower extremities.  Concerning poorly healing ulcers on the right foot, unchanged this last visit 3 weeks ago.  While patient's pulses on bilateral lower extremities are intact and he does not have any claudication symptoms, given his recent stroke and multiple risk factors for vascular disease, will benefit from PAD evaluation. -Vascular ultrasound ABI -Ambulatory referral to podiatry  Hypertension associated with diabetes (HCC) Poorly controlled, goal less than 130/80.  Important to address in setting of recent stroke and severe range albuminuria.  Already on ARB for renal protection. -Increase olmesartan to 40 mg daily, will keep to 1 medication for now to decrease pill burden, consider second agent next visit -BMP ordered today -Follow-up in 2 to 3 weeks, repeat BMP  Return in about 2 weeks (around 03/21/2023) for blood pressure and diabetes follow up.  Leroy Trim Sharion Dove, MD [facility]

## 2023-03-07 NOTE — Patient Instructions (Addendum)
It was great to see you today! Thank you for choosing [facility] for your primary care.  Today we addressed:  Diabetes Sending in [place] for diabetes again.  I am placing a referral to Podiatry for your feet and diabetic ulcers.  Please look out for that phone call.  Shoulder pain Sending in Voltaren gel, use as needed. Can also do OTC lidocaine patch.  Blood pressure Checking your metabolic labs, going to increase your olmesartan to 40 mg, double the dose as before.  You can take two of the 20 mg pills until you run out of those.  I am also placing an order to get a blood pressure read in your feet to look at your circulation.  You will also get a call about that.  We are checking some labs today, including metabolic labs.  You will get a MyChart message or a letter if results are normal. Otherwise, you will get a call from [place].  If you had a referral placed, they will call you to set up an appointment. Please give [place] a call if you don't hear back in the next 2 weeks.  You should return to our clinic in 2-4 weeks to talk more about your diabetes and blood pressure.  Thank you for coming to see [place] at [facility] and for the opportunity to care for you! Dewane Timson, MD 03/07/2023, 2:35 PM

## 2023-03-07 NOTE — Assessment & Plan Note (Signed)
This is a chronic, known issue that has been well-established over several years.  Suspect osteoarthritis in the setting of known past shoulder injuries.  No radiculopathy symptoms and unremarkable physical exam.  Poor candidate for shoulder injection at this time due to A1c of 12.3 recently.  Patient prefers to avoid ibuprofen at this time due to past GI symptoms.  Recommended also decreasing frequency of aspirin 325 mg use. -Voltaren 1% gel as needed -OTC lidocaine patches as needed -Plan to reconsider shoulder injection once A1c less than 10

## 2023-03-07 NOTE — Assessment & Plan Note (Signed)
Patient has very poor sensation of bilateral lower extremities.  Concerning poorly healing ulcers on the right foot, unchanged this last visit 3 weeks ago.  While patient's pulses on bilateral lower extremities are intact and he does not have any claudication symptoms, given his recent stroke and multiple risk factors for vascular disease, will benefit from PAD evaluation. -Vascular ultrasound ABI -Ambulatory referral to podiatry

## 2023-03-08 LAB — BASIC METABOLIC PANEL
BUN/Creatinine Ratio: 18 (ref 9–20)
BUN: 18 mg/dL (ref 6–24)
CO2: 21 mmol/L (ref 20–29)
Calcium: 9.5 mg/dL (ref 8.7–10.2)
Chloride: 103 mmol/L (ref 96–106)
Creatinine, Ser: 1.01 mg/dL (ref 0.76–1.27)
Glucose: 244 mg/dL — ABNORMAL HIGH (ref 70–99)
Potassium: 4 mmol/L (ref 3.5–5.2)
Sodium: 139 mmol/L (ref 134–144)
eGFR: 88 mL/min/{1.73_m2} (ref >59)

## 2023-04-05 ENCOUNTER — Ambulatory Visit (INDEPENDENT_AMBULATORY_CARE_PROVIDER_SITE_OTHER): Payer: Medicare (Managed Care) | Admitting: Student

## 2023-04-05 ENCOUNTER — Encounter: Payer: Self-pay | Admitting: Student

## 2023-04-05 ENCOUNTER — Other Ambulatory Visit: Payer: Self-pay

## 2023-04-05 VITALS — BP 123/85 | HR 116 | Ht 66.0 in | Wt 213.0 lb

## 2023-04-05 DIAGNOSIS — I152 Hypertension secondary to endocrine disorders: Secondary | ICD-10-CM | POA: Diagnosis not present

## 2023-04-05 DIAGNOSIS — E1159 Type 2 diabetes mellitus with other circulatory complications: Secondary | ICD-10-CM | POA: Diagnosis not present

## 2023-04-05 DIAGNOSIS — Z7985 Long-term (current) use of injectable non-insulin antidiabetic drugs: Secondary | ICD-10-CM | POA: Diagnosis not present

## 2023-04-05 DIAGNOSIS — E1142 Type 2 diabetes mellitus with diabetic polyneuropathy: Secondary | ICD-10-CM

## 2023-04-05 MED ORDER — INSULIN GLARGINE 100 UNIT/ML SOLOSTAR PEN: 10.0000 [IU] | PEN_INJECTOR | SUBCUTANEOUS | 11 refills | Status: DC

## 2023-04-05 MED ORDER — SEMAGLUTIDE(0.25 OR 0.5MG/DOS) 2 MG/1.5ML ~~LOC~~ SOPN: 0.2500 mg | PEN_INJECTOR | SUBCUTANEOUS | 3 refills | Status: AC

## 2023-04-05 NOTE — Progress Notes (Signed)
    SUBJECTIVE:   CHIEF COMPLAINT / HPI:   [name] is a 57 year-old male here for HTN and T2DM follow up.  HTN Taking olmesartan 40 mg nightly without difficulty Does not check his blood pressure at home Denies any chest pain, headache today  T2DM -In 2019, A1c 10.1%, was requiring 50 units long-acting insulin with mealtime coverage at that time. -02/08/2023: A1c 12.3%.  Seen at [facility] clinic and started on Jardiance 10 mg daily. -Since then, taking Jardiance without difficulty.  Denies any polyuria, polydipsia, signs or symptoms of urinary tract infection. -Motivated to get his diabetes under better control.  Does not have any supplies at home (insulin needles, glucometer, etc.)  PERTINENT  PMH / PSH: Recent history left pontine CVA (01/2023, left AMA from the ED, but follows outpatient with neurology, taking aspirin 81 mg daily)  OBJECTIVE:   BP 123/85   Pulse (!) 116   Ht 5\' 6"  (1.676 m)   Wt 213 lb (96.6 kg)   SpO2 95%   BMI 34.38 kg/m   General: Well-appearing, appears older than stated age HEENT: Has some hearing difficulties.  Mucous membranes moist. Cardiac: Regular rate and rhythm Respiratory: Speaks in full sentences.  Lungs clear on auscultation Neuro: Speech clear and fluent.  No noticeable focal deficits grossly.   ASSESSMENT/PLAN:   T2DM (type 2 diabetes mellitus) (HCC) I am concerned that his A1c is 12% and the only medication he is taking is Jardiance, which is also increasing his risk for urinary tract infection, for years gangrene, etc. The initial plan discussed at prior visit, and again with me today, was to increase his Jardiance based on prior visit with provider at that time, but on reassessment and chart review, I am worried oral medications alone will not achieve glycemic control at this time given high insulin demands in the past, and I am also concerned about increasing his risk for the complications listed above.  I discussed with Dr. Deirdre Priest  who is in agreement. -Start Lantus 10 units daily.  Fasting glucose goal is <150. -Would also benefit from GLP-1 given obesity.  Sent in Ozempic 0.25 mg subcutaneous weekly to pharmacy, awaiting insurance authorization. -Lantus has been sent to his pharmacy, but we need to get him supplies such as insulin needles, glucometer, which he used to receive through [employer]. -I tried to call the patient and discussed the above plan to make sure that we are both on the same page, although there was no answer so I left a HIPAA compliant voicemail.  I will try again later. -Ideally, patient will return in 1 week for follow-up so that we can make headway in getting his diabetes under better control  Hypertension associated with diabetes (HCC) Pleased with hypertensive control today, 123/85. Continue olmesartan 40 mg daily.  Needs renal protection with T2DM. -BMP today     Darral Dash, DO [facility]

## 2023-04-05 NOTE — Patient Instructions (Addendum)
It was great seeing you today.  As we discussed, -We increased your Jardiance to 25 mg daily -I sent in an injectable medicine to your pharmacy that you take once a week called Ozempic.  We will see if your insurance will authorize this.  We collected blood work today to check your kidney function.  Continue taking your blood pressure medication as prescribed.  Please return in 1 month so that we can recheck your A1c   If you have any questions or concerns, please feel free to call the clinic.   Have a wonderful day,  Dr. Darral Dash [facility] [phone]

## 2023-04-06 LAB — BASIC METABOLIC PANEL
BUN/Creatinine Ratio: 23 — ABNORMAL HIGH (ref 9–20)
BUN: 31 mg/dL — ABNORMAL HIGH (ref 6–24)
CO2: 17 mmol/L — ABNORMAL LOW (ref 20–29)
Calcium: 9.6 mg/dL (ref 8.7–10.2)
Chloride: 102 mmol/L (ref 96–106)
Creatinine, Ser: 1.35 mg/dL — ABNORMAL HIGH (ref 0.76–1.27)
Glucose: 345 mg/dL — ABNORMAL HIGH (ref 70–99)
Potassium: 4.7 mmol/L (ref 3.5–5.2)
Sodium: 141 mmol/L (ref 134–144)
eGFR: 62 mL/min/{1.73_m2} (ref >59)

## 2023-04-06 MED ORDER — INSULIN GLARGINE 100 UNIT/ML SOLOSTAR PEN: 10.0000 [IU] | PEN_INJECTOR | SUBCUTANEOUS | Status: DC

## 2023-04-06 NOTE — Assessment & Plan Note (Signed)
Pleased with hypertensive control today, 123/85. Continue olmesartan 40 mg daily.  Needs renal protection with T2DM. -BMP today

## 2023-04-06 NOTE — Assessment & Plan Note (Addendum)
I am concerned that his A1c is 12% and the only medication he is taking is Jardiance, which is also increasing his risk for urinary tract infection, for years gangrene, etc. The initial plan discussed at prior visit, and again with me today, was to increase his Jardiance based on prior visit with provider at that time, but on reassessment and chart review, I am worried oral medications alone will not achieve glycemic control at this time given high insulin demands in the past, and I am also concerned about increasing his risk for the complications listed above.  I discussed with Dr. Deirdre Priest who is in agreement. -Start Lantus 10 units daily.  Fasting glucose goal is <150. -Would also benefit from GLP-1 given obesity.  Sent in Ozempic 0.25 mg subcutaneous weekly to pharmacy, awaiting insurance authorization. -Lantus has been sent to his pharmacy, but we need to get him supplies such as insulin needles, glucometer, which he used to receive through [employer]. -I tried to call the patient and discussed the above plan to make sure that we are both on the same page, although there was no answer so I left a HIPAA compliant voicemail.  I will try again later. -Ideally, patient will return in 1 week for follow-up so that we can make headway in getting his diabetes under better control

## 2023-04-28 ENCOUNTER — Ambulatory Visit: Payer: Medicare (Managed Care)

## 2023-06-02 DIAGNOSIS — E119 Type 2 diabetes mellitus without complications: Secondary | ICD-10-CM | POA: Diagnosis not present

## 2023-08-19 ENCOUNTER — Telehealth: Payer: Self-pay | Admitting: *Deleted

## 2023-08-19 ENCOUNTER — Encounter: Payer: Self-pay | Admitting: *Deleted

## 2023-08-19 NOTE — Telephone Encounter (Signed)
 Patient was identified as falling into the [place] Measure - Diabetes.   Patient was: Left voicemail to schedule with primary care provider.   Sent MyChart message.

## 2023-08-24 ENCOUNTER — Other Ambulatory Visit: Payer: Self-pay | Admitting: Neurology
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
## 2023-09-08 ENCOUNTER — Ambulatory Visit: Payer: Medicare (Managed Care) | Admitting: Neurology

## 2023-09-08 ENCOUNTER — Encounter: Payer: Self-pay | Admitting: Neurology

## 2023-10-24 ENCOUNTER — Telehealth: Payer: Self-pay | Admitting: Pharmacist

## 2023-10-24 NOTE — Telephone Encounter (Signed)
 Patient contacted for follow-up of medication non-adherence report identified for both statin and olmesartan .   Left HIPAA compliant voice mail requesting call back to direct phone: [phone] and schedule with PCP at convenience of the patient.   Please review supply and appropriate use for both statin and olmesartan  including clarifying current doses of each with new prescriptions at follow-up visit.   Total time with patient call and documentation of interaction: 11 minutes.

## 2023-11-22 ENCOUNTER — Encounter: Payer: Self-pay | Admitting: *Deleted

## 2023-11-25 ENCOUNTER — Other Ambulatory Visit: Payer: Self-pay | Admitting: Neurology

## 2024-03-01 ENCOUNTER — Other Ambulatory Visit: Payer: Self-pay

## 2024-03-01 MED ORDER — INSULIN GLARGINE 100 UNIT/ML SOLOSTAR PEN: 10.0000 [IU] | PEN_INJECTOR | SUBCUTANEOUS | Status: DC

## 2024-03-02 NOTE — Telephone Encounter (Signed)
 LVM for patient to call [facility] and make an appointment.  Cena JONELLE Pesa, CMA

## 2024-04-12 NOTE — Progress Notes (Signed)
 [name]                                          MRN: [id number]   04/12/2024   The [facility]BCI Quality Team Specialist reviewed this patient medical record for the purposes of chart review for care gap closure. The following were reviewed: chart review for care gap closure-glycemic status assessment and kidney health evaluation for diabetes:eGFR  and uACR.    VBCI Quality Team

## 2024-04-24 ENCOUNTER — Other Ambulatory Visit: Payer: Self-pay | Admitting: Family Medicine

## 2024-04-24 ENCOUNTER — Other Ambulatory Visit: Payer: Self-pay

## 2024-04-24 DIAGNOSIS — I10 Essential (primary) hypertension: Secondary | ICD-10-CM

## 2024-05-04 ENCOUNTER — Encounter: Payer: Self-pay | Admitting: Pharmacist

## 2024-05-04 NOTE — Progress Notes (Signed)
 This patient is appearing on a report for being at risk of failing the adherence measure for hypertension (ACEi/ARB) medications this calendar year.   Medication: Olmesartan  20 mg Last fill date: 11/11 for 30 day supply

## 2024-05-12 DIAGNOSIS — M79605 Pain in left leg: Secondary | ICD-10-CM | POA: Diagnosis not present

## 2024-05-12 DIAGNOSIS — I10 Essential (primary) hypertension: Secondary | ICD-10-CM | POA: Diagnosis not present

## 2024-05-12 DIAGNOSIS — L03116 Cellulitis of left lower limb: Secondary | ICD-10-CM | POA: Diagnosis not present

## 2024-05-12 DIAGNOSIS — R7989 Other specified abnormal findings of blood chemistry: Secondary | ICD-10-CM | POA: Diagnosis not present

## 2024-05-12 DIAGNOSIS — E1165 Type 2 diabetes mellitus with hyperglycemia: Secondary | ICD-10-CM | POA: Diagnosis not present

## 2024-05-12 DIAGNOSIS — M7989 Other specified soft tissue disorders: Secondary | ICD-10-CM | POA: Diagnosis not present

## 2024-05-12 DIAGNOSIS — N179 Acute kidney failure, unspecified: Secondary | ICD-10-CM | POA: Diagnosis not present

## 2024-05-12 DIAGNOSIS — E871 Hypo-osmolality and hyponatremia: Secondary | ICD-10-CM | POA: Diagnosis not present

## 2024-05-15 ENCOUNTER — Telehealth: Payer: Self-pay | Admitting: Pharmacist

## 2024-05-15 NOTE — Telephone Encounter (Signed)
 Patient contacted for follow-up of need for annual Diabetes visit to complete quality of care assessments.  *Note - patient is noted to have a crush injury at [facility] ER recently.   Left HIPAA compliant voice mail requesting he call to schedule with PCP, Dr. Alena.    Total time with patient call and documentation of interaction: 9 minutes.

## 2024-05-16 NOTE — Telephone Encounter (Signed)
 Reviewed and agree with Dr Rennis plan.

## 2024-05-22 DIAGNOSIS — E1169 Type 2 diabetes mellitus with other specified complication: Secondary | ICD-10-CM | POA: Diagnosis not present

## 2024-05-22 DIAGNOSIS — K219 Gastro-esophageal reflux disease without esophagitis: Secondary | ICD-10-CM | POA: Diagnosis not present

## 2024-05-22 DIAGNOSIS — R638 Other symptoms and signs concerning food and fluid intake: Secondary | ICD-10-CM | POA: Diagnosis not present

## 2024-05-22 DIAGNOSIS — L089 Local infection of the skin and subcutaneous tissue, unspecified: Secondary | ICD-10-CM | POA: Diagnosis not present

## 2024-05-22 DIAGNOSIS — T383X6A Underdosing of insulin and oral hypoglycemic [antidiabetic] drugs, initial encounter: Secondary | ICD-10-CM | POA: Diagnosis not present

## 2024-05-22 DIAGNOSIS — Z880 Allergy status to penicillin: Secondary | ICD-10-CM | POA: Diagnosis not present

## 2024-05-22 DIAGNOSIS — M7989 Other specified soft tissue disorders: Secondary | ICD-10-CM | POA: Diagnosis not present

## 2024-05-22 DIAGNOSIS — L03116 Cellulitis of left lower limb: Secondary | ICD-10-CM | POA: Diagnosis not present

## 2024-05-22 DIAGNOSIS — Z79899 Other long term (current) drug therapy: Secondary | ICD-10-CM | POA: Diagnosis not present

## 2024-05-22 DIAGNOSIS — E11621 Type 2 diabetes mellitus with foot ulcer: Secondary | ICD-10-CM | POA: Diagnosis not present

## 2024-05-22 DIAGNOSIS — Z9089 Acquired absence of other organs: Secondary | ICD-10-CM | POA: Diagnosis not present

## 2024-05-22 DIAGNOSIS — Z9889 Other specified postprocedural states: Secondary | ICD-10-CM | POA: Diagnosis not present

## 2024-05-22 DIAGNOSIS — E1165 Type 2 diabetes mellitus with hyperglycemia: Secondary | ICD-10-CM | POA: Diagnosis not present

## 2024-05-22 DIAGNOSIS — Z6833 Body mass index (BMI) 33.0-33.9, adult: Secondary | ICD-10-CM | POA: Diagnosis not present

## 2024-05-22 DIAGNOSIS — E860 Dehydration: Secondary | ICD-10-CM | POA: Diagnosis not present

## 2024-05-22 DIAGNOSIS — I10 Essential (primary) hypertension: Secondary | ICD-10-CM | POA: Diagnosis not present

## 2024-05-22 DIAGNOSIS — Z888 Allergy status to other drugs, medicaments and biological substances status: Secondary | ICD-10-CM | POA: Diagnosis not present

## 2024-05-22 DIAGNOSIS — S9782XS Crushing injury of left foot, sequela: Secondary | ICD-10-CM | POA: Diagnosis not present

## 2024-05-31 ENCOUNTER — Telehealth: Payer: Self-pay

## 2024-05-31 NOTE — Progress Notes (Signed)
 [name]                                          MRN: [id number]   05/31/2024   The VBCI Quality Team Specialist reviewed this patient medical record for the purposes of chart review for care gap closure. The following were reviewed: chart review for care gap closure-controlling blood pressure.    VBCI Quality Team

## 2024-05-31 NOTE — Transitions of Care (Post Inpatient/ED Visit) (Signed)
° °  05/31/2024  Name: [name] MRN: [id number] DOB: 1966-02-04  Today's TOC FU Call Status: Today's TOC FU Call Status:: Unsuccessful Call (2nd Attempt) Unsuccessful Call (2nd Attempt) Date: 05/31/24  Attempted to reach the patient regarding the most recent Inpatient/ED visit.  Follow Up Plan: Additional outreach attempts will be made to reach the patient to complete the Transitions of Care (Post Inpatient/ED visit) call.    Bing Edison MSN, RN RN [employer] [facility]  [facility] Office Hours M-F [phone] Direct Dial: [phone] Main Phone [phone]  Fax: [phone] [email or web address]

## 2024-05-31 NOTE — Progress Notes (Signed)
 [name]                                          MRN: [id number]   05/31/2024   The VBCI Quality Team Specialist reviewed this patient medical record for the purposes of chart review for care gap closure. The following were reviewed: chart review for care gap closure-glycemic status assessment.    VBCI Quality Team

## 2024-05-31 NOTE — Transitions of Care (Post Inpatient/ED Visit) (Signed)
° °  05/31/2024  Name: BAILEN GEFFRE MRN: [id number] DOB: [birth year]  Today's TOC FU Call Status: Today's TOC FU Call Status:: Unsuccessful Call (1st Attempt) Unsuccessful Call (1st Attempt) Date: 05/31/24  Attempted to reach the patient regarding the most recent Inpatient/ED visit.  Follow Up Plan: Additional outreach attempts will be made to reach the patient to complete the Transitions of Care (Post Inpatient/ED visit) call.    Bing Edison MSN, RN RN [employer]  [facility] Office Hours M-F [phone] Direct Dial: [phone] Main Phone [phone]  Fax: [phone] [email or web address]

## 2024-05-31 NOTE — Progress Notes (Signed)
 [name]                                          MRN: [id number]   05/31/2024   The VBCI Quality Team Specialist reviewed this patient medical record for the purposes of chart review for care gap closure. The following were reviewed: chart review for care gap closure-diabetic eye exam.    VBCI Quality Team

## 2024-05-31 NOTE — Progress Notes (Signed)
 [name]                                          MRN: [id number]   05/31/2024   The [facility] Quality Team Specialist reviewed this patient medical record for the purposes of chart review for care gap closure. The following were reviewed: chart review for care gap closure-kidney health evaluation for diabetes:eGFR  and uACR.    [facility]I Quality Team

## 2024-06-01 ENCOUNTER — Telehealth: Payer: Self-pay

## 2024-06-01 NOTE — Transitions of Care (Post Inpatient/ED Visit) (Signed)
" ° °  06/01/2024  Name: [name] MRN: [id number] DOB: [birth year]  Today's TOC FU Call Status: Today's TOC FU Call Status:: Unsuccessful Call (3rd Attempt) Unsuccessful Call (3rd Attempt) Date: 06/01/24  Attempted to reach the patient regarding the most recent Inpatient/ED visit.  Follow Up Plan: No further outreach attempts will be made at this time. We have been unable to contact the patient.  Ashley Montminy J. Kailyn Vanderslice RN, MSN [facility], [facility] RN Care Manager Direct Dial: [phone]  Fax: [phone] Website: [email or web address]   "

## 2024-06-08 DIAGNOSIS — L089 Local infection of the skin and subcutaneous tissue, unspecified: Secondary | ICD-10-CM | POA: Diagnosis not present

## 2024-06-08 DIAGNOSIS — E11621 Type 2 diabetes mellitus with foot ulcer: Secondary | ICD-10-CM | POA: Diagnosis not present

## 2024-06-08 DIAGNOSIS — L97423 Non-pressure chronic ulcer of left heel and midfoot with necrosis of muscle: Secondary | ICD-10-CM | POA: Diagnosis not present

## 2024-06-08 DIAGNOSIS — E11628 Type 2 diabetes mellitus with other skin complications: Secondary | ICD-10-CM | POA: Diagnosis not present

## 2024-06-12 ENCOUNTER — Telehealth: Payer: Self-pay

## 2024-06-12 NOTE — Telephone Encounter (Signed)
 Received call from [place] at [facility] regarding patient.   She reports that patient has not received wound vac supplies.   She states that she has applied wet to dry dressing until wound vac supplies can be delivered.   This was ordered by provider within [facility] system during recent hospitalization. DME supplier is  [employer] [phone].   [facility]. They will need a valid billable order. [place] states that she will reach out to surgeon's office to see if they are able to update order.   I advised that patient has not been seen in our office in several months and may need appt with PCP prior to DME orders being placed.   Called patient to schedule hospital follow up. Scheduled patient for 06/18/24. He voices multiple frustrations/cursing with run around with home health and receiving medical equipment.   He is also out of Lantus . Requesting refill on Lantus  prior to his appt.   Will forward to PCP.   Will check on status of wound vac tomorrow.   Chiquita JAYSON English, RN

## 2024-06-13 MED ORDER — INSULIN GLARGINE 100 UNIT/ML SOLOSTAR PEN: 10.0000 [IU] | PEN_INJECTOR | SUBCUTANEOUS | 0 refills | Status: AC

## 2024-06-13 NOTE — Telephone Encounter (Signed)
 Attempted to reach out to [facility] with [employer]. She did not answer. Will attempt to reach at later time for update on wound vac.   Chiquita JAYSON English, RN

## 2024-06-13 NOTE — Telephone Encounter (Signed)
 [name] called back to office.   She spoke with surgeon's office yesterday and faxed over updated order for provider's signature.   Patient should be able to get supplies once surgeon's office signs paperwork.   Drew JAYSON English, RN

## 2024-06-18 ENCOUNTER — Ambulatory Visit: Payer: Self-pay

## 2024-06-27 ENCOUNTER — Telehealth: Payer: Self-pay | Admitting: Pharmacist

## 2024-06-27 NOTE — Telephone Encounter (Signed)
 Attempted to contact patient for follow-up of Diabetes control and request to reschedule as he has not been seen in > 1 year and has had multiple health issues (ED visits).  Left HIPAA compliant voice mail requesting call back to  My direct phone: [phone] Or Main number to reschedule: [phone]  Total time with patient call and documentation of interaction: 4 minutes.  Follow-up phone call planned: none

## 2024-07-06 ENCOUNTER — Encounter: Payer: Self-pay | Admitting: Pharmacist

## 2024-07-06 NOTE — Progress Notes (Signed)
 This patient is appearing on a report for being at risk of failing the adherence measure for hypertension (ACEi/ARB) medications this calendar year.   Medication: olmesartan  switched to losartan   Last fill date: 05/30/24 for 30 day supply  Reviewed medication indication, dosing, and goals of therapy.   This patient is appearing on a report for being at risk of failing the adherence measure for cholesterol (statin) medications this calendar year.   Medication: atorvastatin  Last fill date: no recent fill   Reviewed medication indication, dosing, and goals of therapy.
